# Patient Record
Sex: Female | Born: 1940 | Race: White | Hispanic: No | Marital: Married | Smoking: Former smoker
Health system: Southern US, Community
[De-identification: ages and names within clinical notes are randomized; demographics above are authoritative.]

## PROBLEM LIST (undated history)

## (undated) DIAGNOSIS — I4891 Unspecified atrial fibrillation: Secondary | ICD-10-CM

## (undated) DIAGNOSIS — I2699 Other pulmonary embolism without acute cor pulmonale: Secondary | ICD-10-CM

## (undated) DIAGNOSIS — Z993 Dependence on wheelchair: Secondary | ICD-10-CM

## (undated) DIAGNOSIS — K449 Diaphragmatic hernia without obstruction or gangrene: Secondary | ICD-10-CM

## (undated) DIAGNOSIS — I10 Essential (primary) hypertension: Secondary | ICD-10-CM

## (undated) DIAGNOSIS — I82409 Acute embolism and thrombosis of unspecified deep veins of unspecified lower extremity: Secondary | ICD-10-CM

## (undated) DIAGNOSIS — R42 Dizziness and giddiness: Secondary | ICD-10-CM

## (undated) DIAGNOSIS — M810 Age-related osteoporosis without current pathological fracture: Secondary | ICD-10-CM

## (undated) DIAGNOSIS — E119 Type 2 diabetes mellitus without complications: Secondary | ICD-10-CM

## (undated) DIAGNOSIS — D869 Sarcoidosis, unspecified: Secondary | ICD-10-CM

## (undated) DIAGNOSIS — M1990 Unspecified osteoarthritis, unspecified site: Secondary | ICD-10-CM

## (undated) DIAGNOSIS — G8929 Other chronic pain: Secondary | ICD-10-CM

## (undated) HISTORY — PX: LUNG LOBECTOMY: SHX167

## (undated) HISTORY — DX: Type 2 diabetes mellitus without complications: E11.9

## (undated) HISTORY — DX: Sarcoidosis, unspecified: D86.9

## (undated) HISTORY — PX: ABDOMINAL HYSTERECTOMY: SHX81

## (undated) HISTORY — PX: JOINT REPLACEMENT: SHX530

## (undated) HISTORY — DX: Diaphragmatic hernia without obstruction or gangrene: K44.9

## (undated) HISTORY — DX: Age-related osteoporosis without current pathological fracture: M81.0

---

## 1998-01-21 ENCOUNTER — Ambulatory Visit (HOSPITAL_COMMUNITY): Admission: RE | Admit: 1998-01-21 | Discharge: 1998-01-21 | Payer: Self-pay

## 1998-02-02 ENCOUNTER — Ambulatory Visit (HOSPITAL_COMMUNITY): Admission: RE | Admit: 1998-02-02 | Discharge: 1998-02-02 | Payer: Self-pay

## 1998-02-14 ENCOUNTER — Ambulatory Visit (HOSPITAL_COMMUNITY): Admission: RE | Admit: 1998-02-14 | Discharge: 1998-02-14 | Payer: Self-pay | Admitting: Psychiatry

## 1998-03-02 ENCOUNTER — Other Ambulatory Visit: Admission: RE | Admit: 1998-03-02 | Discharge: 1998-03-02 | Payer: Self-pay | Admitting: Endocrinology

## 1998-03-15 ENCOUNTER — Ambulatory Visit (HOSPITAL_COMMUNITY): Admission: RE | Admit: 1998-03-15 | Discharge: 1998-03-15 | Payer: Self-pay | Admitting: Endocrinology

## 1998-11-07 ENCOUNTER — Ambulatory Visit (HOSPITAL_COMMUNITY): Admission: RE | Admit: 1998-11-07 | Discharge: 1998-11-07 | Payer: Self-pay | Admitting: Endocrinology

## 1998-11-07 ENCOUNTER — Encounter: Payer: Self-pay | Admitting: Endocrinology

## 1999-12-10 ENCOUNTER — Ambulatory Visit (HOSPITAL_COMMUNITY): Admission: RE | Admit: 1999-12-10 | Discharge: 1999-12-10 | Payer: Self-pay

## 1999-12-13 ENCOUNTER — Ambulatory Visit (HOSPITAL_COMMUNITY): Admission: RE | Admit: 1999-12-13 | Discharge: 1999-12-13 | Payer: Self-pay

## 2000-05-17 ENCOUNTER — Ambulatory Visit (HOSPITAL_COMMUNITY): Admission: RE | Admit: 2000-05-17 | Discharge: 2000-05-17 | Payer: Self-pay

## 2001-03-04 ENCOUNTER — Other Ambulatory Visit: Admission: RE | Admit: 2001-03-04 | Discharge: 2001-03-04 | Payer: Self-pay | Admitting: Obstetrics and Gynecology

## 2001-03-27 ENCOUNTER — Ambulatory Visit (HOSPITAL_COMMUNITY): Admission: RE | Admit: 2001-03-27 | Discharge: 2001-03-27 | Payer: Self-pay

## 2001-04-18 ENCOUNTER — Encounter: Payer: Self-pay | Admitting: Emergency Medicine

## 2001-04-19 ENCOUNTER — Observation Stay (HOSPITAL_COMMUNITY): Admission: EM | Admit: 2001-04-19 | Discharge: 2001-04-21 | Payer: Self-pay | Admitting: Emergency Medicine

## 2002-03-18 ENCOUNTER — Encounter: Admission: RE | Admit: 2002-03-18 | Discharge: 2002-03-18 | Payer: Self-pay

## 2002-04-20 ENCOUNTER — Encounter: Payer: Self-pay | Admitting: Neurology

## 2002-04-20 ENCOUNTER — Ambulatory Visit (HOSPITAL_COMMUNITY): Admission: RE | Admit: 2002-04-20 | Discharge: 2002-04-20 | Payer: Self-pay | Admitting: Neurology

## 2002-05-07 ENCOUNTER — Encounter: Payer: Self-pay | Admitting: Neurology

## 2002-05-07 ENCOUNTER — Ambulatory Visit (HOSPITAL_COMMUNITY): Admission: RE | Admit: 2002-05-07 | Discharge: 2002-05-07 | Payer: Self-pay | Admitting: Neurology

## 2002-12-27 ENCOUNTER — Ambulatory Visit (HOSPITAL_COMMUNITY): Admission: RE | Admit: 2002-12-27 | Discharge: 2002-12-27 | Payer: Self-pay

## 2003-05-17 ENCOUNTER — Encounter: Admission: RE | Admit: 2003-05-17 | Discharge: 2003-05-17 | Payer: Self-pay

## 2003-06-14 ENCOUNTER — Encounter: Admission: RE | Admit: 2003-06-14 | Discharge: 2003-06-14 | Payer: Self-pay

## 2003-07-02 ENCOUNTER — Encounter: Admission: RE | Admit: 2003-07-02 | Discharge: 2003-07-02 | Payer: Self-pay

## 2004-12-09 ENCOUNTER — Ambulatory Visit (HOSPITAL_COMMUNITY): Admission: RE | Admit: 2004-12-09 | Discharge: 2004-12-09 | Payer: Self-pay

## 2005-04-17 ENCOUNTER — Emergency Department (HOSPITAL_COMMUNITY): Admission: EM | Admit: 2005-04-17 | Discharge: 2005-04-17 | Payer: Self-pay | Admitting: *Deleted

## 2007-11-15 ENCOUNTER — Emergency Department (HOSPITAL_COMMUNITY): Admission: EM | Admit: 2007-11-15 | Discharge: 2007-11-15 | Payer: Self-pay | Admitting: Emergency Medicine

## 2010-05-02 ENCOUNTER — Inpatient Hospital Stay (HOSPITAL_COMMUNITY): Admission: RE | Admit: 2010-05-02 | Discharge: 2010-05-05 | Payer: Self-pay | Admitting: Orthopedic Surgery

## 2010-08-27 ENCOUNTER — Encounter: Payer: Self-pay | Admitting: Neurology

## 2010-10-19 ENCOUNTER — Ambulatory Visit (HOSPITAL_COMMUNITY): Payer: Medicare Other | Attending: Rheumatology

## 2010-10-19 DIAGNOSIS — M810 Age-related osteoporosis without current pathological fracture: Secondary | ICD-10-CM | POA: Insufficient documentation

## 2010-10-19 LAB — URINALYSIS, ROUTINE W REFLEX MICROSCOPIC
Hgb urine dipstick: NEGATIVE
Ketones, ur: 15 mg/dL — AB
Protein, ur: NEGATIVE mg/dL
Urobilinogen, UA: 0.2 mg/dL (ref 0.0–1.0)

## 2010-10-19 LAB — COMPREHENSIVE METABOLIC PANEL
ALT: 14 U/L (ref 0–35)
AST: 25 U/L (ref 0–37)
Albumin: 3 g/dL — ABNORMAL LOW (ref 3.5–5.2)
Calcium: 9.2 mg/dL (ref 8.4–10.5)
GFR calc Af Amer: >60 mL/min (ref >60)
Sodium: 140 mEq/L (ref 135–145)
Total Protein: 6.4 g/dL (ref 6.0–8.3)

## 2010-10-19 LAB — GLUCOSE, CAPILLARY
Glucose-Capillary: 105 mg/dL — ABNORMAL HIGH (ref 70–99)
Glucose-Capillary: 112 mg/dL — ABNORMAL HIGH (ref 70–99)
Glucose-Capillary: 142 mg/dL — ABNORMAL HIGH (ref 70–99)
Glucose-Capillary: 67 mg/dL — ABNORMAL LOW (ref 70–99)
Glucose-Capillary: 80 mg/dL (ref 70–99)
Glucose-Capillary: 88 mg/dL (ref 70–99)
Glucose-Capillary: 96 mg/dL (ref 70–99)

## 2010-10-19 LAB — CBC
HCT: 36 % (ref 36.0–46.0)
MCHC: 32.7 g/dL (ref 30.0–36.0)
MCV: 98.4 fL (ref 78.0–100.0)
Platelets: 299 10*3/uL (ref 150–400)
RBC: 3.66 MIL/uL — ABNORMAL LOW (ref 3.87–5.11)
RDW: 12.1 % (ref 11.5–15.5)
WBC: 9.4 10*3/uL (ref 4.0–10.5)

## 2010-10-19 LAB — BASIC METABOLIC PANEL
Chloride: 100 mEq/L (ref 96–112)
Creatinine, Ser: 0.6 mg/dL (ref 0.4–1.2)
GFR calc Af Amer: >60 mL/min (ref >60)
Potassium: 3.5 mEq/L (ref 3.5–5.1)

## 2010-12-22 NOTE — H&P (Signed)
[place]  Patient:    Elizabeth Drake, Elizabeth Drake Visit Number: [id number] MRN: [id number]          Service Type: MED Location: [phone] Attending Physician:  Armandina Gemma Dictated by:   Lemmie Evens, M.D. 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  The patient has had symptoms of general fatigue with the inability to function very well on the day prior to admission.  In fact, she was unable to walk in the home just prior to being brought to the emergency room.  She had been noticing the passage of foul-smelling urine for several days prior to the admission.  In addition, she has had one or two episodes of shaking chills. She does have mild nausea and one or two episodes of diarrhea without abdominal pain.  She denies recent cough, peripheral numbness or chest pain.  PAST MEDICAL HISTORY:  The patient does have a past medical history of sarcoidosis for the past five years.  She does she a Dr. Everlena Cooper, an ophthalmologist at [facility], for recurrent sarcoid-related iritis.  Dr. Phylliss Bob has been following the patient, as well.  The patient has had some dyspnea on exertion.  Apparently, she is not followed by a pulmonary specialist on a regular basis.  MEDICATIONS:  The patient does take several medications, which include Fosamax 70 mg q.d.; prednisone 10 mg q.d.; methotrexate 17.5 mg weekly.  She did take one dose of Septra on the day of admission because of the urinary symptoms, thinking that she did have a urinary tract infection.  SOCIAL HISTORY:  The patient denies smoking cigarettes or drinking alcohol.  ALLERGIES:  The patient has had intolerance to both Indocin and  Darvocet previously.  FAMILY HISTORY:  The patients mother has been treated for spinal osteoarthritis.  LABORATORY DATA:  White cell count about 15,000.  Approximately 12-20 white cells in the urine.  Potassium level approximately 2.8.  PHYSICAL EXAMINATION:  GENERAL:  Healthy appearing, although she does have generalized fatigue.  NECK:  No thyromegaly or adenopathy.  Good flexibility.  HEART:  Regular sinus rhythm.  Heart rate 70.  No murmurs, rubs, or gallops appreciated.  LUNGS:  Clear breath sounds bilaterally without rales.  ABDOMEN:  Soft abdomen without hepatosplenomegaly or tenderness.  Bowel sounds were active.  EXTREMITIES:  Good mobility of the upper and lower extremities without signs of joint swelling or peripheral edema.   NEUROLOGIC:  The patient was oriented to time and place.  She could move upper and lower extremities without difficulty.  ASSESSMENT:  The patient has had symptoms of malaise, fatigue, and recurrent chills with low-grade fever prior to admission.  In addition, she has complained of foul-smelling urine over the last few days.  I do suspect she has urosepsis.  She is being admitted for IV antibiotic therapy.  In addition, she does have hypokalemia. Dictated by:   Lemmie Evens, M.D. Attending Physician:  Armandina Gemma DD:  04/19/01 TD:  04/19/01 Job: [id number]

## 2011-05-01 LAB — CBC
Hemoglobin: 13.6
MCHC: 34.4
MCV: 101.7 — ABNORMAL HIGH
RBC: 3.91

## 2011-05-01 LAB — DIFFERENTIAL
Basophils Absolute: 0.1
Eosinophils Absolute: 0
Lymphs Abs: 1.8
Neutrophils Relative %: 75

## 2011-05-01 LAB — COMPREHENSIVE METABOLIC PANEL
ALT: 19
CO2: 27
Calcium: 8.8
Creatinine, Ser: 0.65
GFR calc non Af Amer: >60
Glucose, Bld: 151 — ABNORMAL HIGH
Sodium: 136
Total Bilirubin: 0.7

## 2011-05-01 LAB — URINALYSIS, ROUTINE W REFLEX MICROSCOPIC
Bilirubin Urine: NEGATIVE
Glucose, UA: NEGATIVE
Ketones, ur: NEGATIVE
pH: 6.5

## 2011-08-09 DIAGNOSIS — T7840XA Allergy, unspecified, initial encounter: Secondary | ICD-10-CM | POA: Diagnosis not present

## 2011-08-15 DIAGNOSIS — L259 Unspecified contact dermatitis, unspecified cause: Secondary | ICD-10-CM | POA: Diagnosis not present

## 2011-08-15 DIAGNOSIS — D485 Neoplasm of uncertain behavior of skin: Secondary | ICD-10-CM | POA: Diagnosis not present

## 2011-08-28 DIAGNOSIS — L259 Unspecified contact dermatitis, unspecified cause: Secondary | ICD-10-CM | POA: Diagnosis not present

## 2011-08-29 DIAGNOSIS — M810 Age-related osteoporosis without current pathological fracture: Secondary | ICD-10-CM | POA: Diagnosis not present

## 2011-08-29 DIAGNOSIS — D869 Sarcoidosis, unspecified: Secondary | ICD-10-CM | POA: Diagnosis not present

## 2011-08-29 DIAGNOSIS — M545 Low back pain: Secondary | ICD-10-CM | POA: Diagnosis not present

## 2011-09-24 DIAGNOSIS — L259 Unspecified contact dermatitis, unspecified cause: Secondary | ICD-10-CM | POA: Diagnosis not present

## 2011-09-24 DIAGNOSIS — B86 Scabies: Secondary | ICD-10-CM | POA: Diagnosis not present

## 2011-09-25 DIAGNOSIS — L851 Acquired keratosis [keratoderma] palmaris et plantaris: Secondary | ICD-10-CM | POA: Diagnosis not present

## 2011-09-25 DIAGNOSIS — E119 Type 2 diabetes mellitus without complications: Secondary | ICD-10-CM | POA: Diagnosis not present

## 2011-09-25 DIAGNOSIS — E1159 Type 2 diabetes mellitus with other circulatory complications: Secondary | ICD-10-CM | POA: Diagnosis not present

## 2011-10-08 DIAGNOSIS — L0390 Cellulitis, unspecified: Secondary | ICD-10-CM | POA: Diagnosis not present

## 2011-10-08 DIAGNOSIS — L0291 Cutaneous abscess, unspecified: Secondary | ICD-10-CM | POA: Diagnosis not present

## 2011-10-11 DIAGNOSIS — L0291 Cutaneous abscess, unspecified: Secondary | ICD-10-CM | POA: Diagnosis not present

## 2011-10-11 DIAGNOSIS — E785 Hyperlipidemia, unspecified: Secondary | ICD-10-CM | POA: Diagnosis not present

## 2011-10-11 DIAGNOSIS — L0390 Cellulitis, unspecified: Secondary | ICD-10-CM | POA: Diagnosis not present

## 2011-10-11 DIAGNOSIS — R7989 Other specified abnormal findings of blood chemistry: Secondary | ICD-10-CM | POA: Diagnosis not present

## 2011-10-11 DIAGNOSIS — I10 Essential (primary) hypertension: Secondary | ICD-10-CM | POA: Diagnosis not present

## 2011-10-11 DIAGNOSIS — E119 Type 2 diabetes mellitus without complications: Secondary | ICD-10-CM | POA: Diagnosis not present

## 2011-11-19 DIAGNOSIS — M810 Age-related osteoporosis without current pathological fracture: Secondary | ICD-10-CM | POA: Diagnosis not present

## 2011-11-27 DIAGNOSIS — I10 Essential (primary) hypertension: Secondary | ICD-10-CM | POA: Diagnosis not present

## 2011-12-04 DIAGNOSIS — E119 Type 2 diabetes mellitus without complications: Secondary | ICD-10-CM | POA: Diagnosis not present

## 2011-12-04 DIAGNOSIS — L609 Nail disorder, unspecified: Secondary | ICD-10-CM | POA: Diagnosis not present

## 2011-12-04 DIAGNOSIS — L851 Acquired keratosis [keratoderma] palmaris et plantaris: Secondary | ICD-10-CM | POA: Diagnosis not present

## 2011-12-04 DIAGNOSIS — E1159 Type 2 diabetes mellitus with other circulatory complications: Secondary | ICD-10-CM | POA: Diagnosis not present

## 2012-01-02 DIAGNOSIS — N390 Urinary tract infection, site not specified: Secondary | ICD-10-CM | POA: Diagnosis not present

## 2012-02-12 DIAGNOSIS — N3000 Acute cystitis without hematuria: Secondary | ICD-10-CM | POA: Diagnosis not present

## 2012-02-12 DIAGNOSIS — E119 Type 2 diabetes mellitus without complications: Secondary | ICD-10-CM | POA: Diagnosis not present

## 2012-02-12 DIAGNOSIS — N3941 Urge incontinence: Secondary | ICD-10-CM | POA: Diagnosis not present

## 2012-02-12 DIAGNOSIS — E1159 Type 2 diabetes mellitus with other circulatory complications: Secondary | ICD-10-CM | POA: Diagnosis not present

## 2012-02-12 DIAGNOSIS — L851 Acquired keratosis [keratoderma] palmaris et plantaris: Secondary | ICD-10-CM | POA: Diagnosis not present

## 2012-02-12 DIAGNOSIS — N3020 Other chronic cystitis without hematuria: Secondary | ICD-10-CM | POA: Diagnosis not present

## 2012-02-26 DIAGNOSIS — M810 Age-related osteoporosis without current pathological fracture: Secondary | ICD-10-CM | POA: Diagnosis not present

## 2012-02-26 DIAGNOSIS — D869 Sarcoidosis, unspecified: Secondary | ICD-10-CM | POA: Diagnosis not present

## 2012-03-03 DIAGNOSIS — I10 Essential (primary) hypertension: Secondary | ICD-10-CM | POA: Diagnosis not present

## 2012-03-03 DIAGNOSIS — N390 Urinary tract infection, site not specified: Secondary | ICD-10-CM | POA: Diagnosis not present

## 2012-03-03 DIAGNOSIS — E785 Hyperlipidemia, unspecified: Secondary | ICD-10-CM | POA: Diagnosis not present

## 2012-03-03 DIAGNOSIS — E119 Type 2 diabetes mellitus without complications: Secondary | ICD-10-CM | POA: Diagnosis not present

## 2012-03-11 DIAGNOSIS — N3020 Other chronic cystitis without hematuria: Secondary | ICD-10-CM | POA: Diagnosis not present

## 2012-03-18 DIAGNOSIS — M810 Age-related osteoporosis without current pathological fracture: Secondary | ICD-10-CM | POA: Diagnosis not present

## 2012-04-22 DIAGNOSIS — E119 Type 2 diabetes mellitus without complications: Secondary | ICD-10-CM | POA: Diagnosis not present

## 2012-04-22 DIAGNOSIS — L851 Acquired keratosis [keratoderma] palmaris et plantaris: Secondary | ICD-10-CM | POA: Diagnosis not present

## 2012-04-22 DIAGNOSIS — E1159 Type 2 diabetes mellitus with other circulatory complications: Secondary | ICD-10-CM | POA: Diagnosis not present

## 2012-04-22 DIAGNOSIS — B351 Tinea unguium: Secondary | ICD-10-CM | POA: Diagnosis not present

## 2012-04-29 DIAGNOSIS — D869 Sarcoidosis, unspecified: Secondary | ICD-10-CM | POA: Diagnosis not present

## 2012-04-29 DIAGNOSIS — Z23 Encounter for immunization: Secondary | ICD-10-CM | POA: Diagnosis not present

## 2012-04-29 DIAGNOSIS — M810 Age-related osteoporosis without current pathological fracture: Secondary | ICD-10-CM | POA: Diagnosis not present

## 2012-05-27 DIAGNOSIS — Z79899 Other long term (current) drug therapy: Secondary | ICD-10-CM | POA: Diagnosis not present

## 2012-05-27 DIAGNOSIS — Z01812 Encounter for preprocedural laboratory examination: Secondary | ICD-10-CM | POA: Diagnosis not present

## 2012-06-16 DIAGNOSIS — K5900 Constipation, unspecified: Secondary | ICD-10-CM | POA: Diagnosis not present

## 2012-06-16 DIAGNOSIS — E785 Hyperlipidemia, unspecified: Secondary | ICD-10-CM | POA: Diagnosis not present

## 2012-06-16 DIAGNOSIS — I10 Essential (primary) hypertension: Secondary | ICD-10-CM | POA: Diagnosis not present

## 2012-06-16 DIAGNOSIS — E039 Hypothyroidism, unspecified: Secondary | ICD-10-CM | POA: Diagnosis not present

## 2012-06-16 DIAGNOSIS — M810 Age-related osteoporosis without current pathological fracture: Secondary | ICD-10-CM | POA: Diagnosis not present

## 2012-07-08 DIAGNOSIS — B351 Tinea unguium: Secondary | ICD-10-CM | POA: Diagnosis not present

## 2012-07-08 DIAGNOSIS — E119 Type 2 diabetes mellitus without complications: Secondary | ICD-10-CM | POA: Diagnosis not present

## 2012-07-08 DIAGNOSIS — E1159 Type 2 diabetes mellitus with other circulatory complications: Secondary | ICD-10-CM | POA: Diagnosis not present

## 2012-07-08 DIAGNOSIS — L851 Acquired keratosis [keratoderma] palmaris et plantaris: Secondary | ICD-10-CM | POA: Diagnosis not present

## 2012-07-17 DIAGNOSIS — H524 Presbyopia: Secondary | ICD-10-CM | POA: Diagnosis not present

## 2012-07-17 DIAGNOSIS — H43399 Other vitreous opacities, unspecified eye: Secondary | ICD-10-CM | POA: Diagnosis not present

## 2012-07-17 DIAGNOSIS — H2510 Age-related nuclear cataract, unspecified eye: Secondary | ICD-10-CM | POA: Diagnosis not present

## 2012-07-17 DIAGNOSIS — E119 Type 2 diabetes mellitus without complications: Secondary | ICD-10-CM | POA: Diagnosis not present

## 2012-08-04 DIAGNOSIS — D869 Sarcoidosis, unspecified: Secondary | ICD-10-CM | POA: Diagnosis not present

## 2012-08-04 DIAGNOSIS — M810 Age-related osteoporosis without current pathological fracture: Secondary | ICD-10-CM | POA: Diagnosis not present

## 2012-09-30 DIAGNOSIS — E559 Vitamin D deficiency, unspecified: Secondary | ICD-10-CM | POA: Diagnosis not present

## 2012-09-30 DIAGNOSIS — D869 Sarcoidosis, unspecified: Secondary | ICD-10-CM | POA: Diagnosis not present

## 2012-09-30 DIAGNOSIS — E119 Type 2 diabetes mellitus without complications: Secondary | ICD-10-CM | POA: Diagnosis not present

## 2012-09-30 DIAGNOSIS — E785 Hyperlipidemia, unspecified: Secondary | ICD-10-CM | POA: Diagnosis not present

## 2012-09-30 DIAGNOSIS — I10 Essential (primary) hypertension: Secondary | ICD-10-CM | POA: Diagnosis not present

## 2012-10-14 DIAGNOSIS — B351 Tinea unguium: Secondary | ICD-10-CM | POA: Diagnosis not present

## 2012-10-14 DIAGNOSIS — L851 Acquired keratosis [keratoderma] palmaris et plantaris: Secondary | ICD-10-CM | POA: Diagnosis not present

## 2012-10-14 DIAGNOSIS — E1159 Type 2 diabetes mellitus with other circulatory complications: Secondary | ICD-10-CM | POA: Diagnosis not present

## 2012-10-14 DIAGNOSIS — E119 Type 2 diabetes mellitus without complications: Secondary | ICD-10-CM | POA: Diagnosis not present

## 2012-11-03 DIAGNOSIS — D869 Sarcoidosis, unspecified: Secondary | ICD-10-CM | POA: Diagnosis not present

## 2012-11-03 DIAGNOSIS — M810 Age-related osteoporosis without current pathological fracture: Secondary | ICD-10-CM | POA: Diagnosis not present

## 2012-11-04 DIAGNOSIS — N3020 Other chronic cystitis without hematuria: Secondary | ICD-10-CM | POA: Diagnosis not present

## 2012-11-04 DIAGNOSIS — N3941 Urge incontinence: Secondary | ICD-10-CM | POA: Diagnosis not present

## 2012-11-05 ENCOUNTER — Telehealth: Payer: Self-pay | Admitting: Nurse Practitioner

## 2012-11-05 NOTE — Telephone Encounter (Signed)
Completed by RX office

## 2012-11-05 NOTE — Telephone Encounter (Signed)
Please advise 

## 2012-11-12 ENCOUNTER — Other Ambulatory Visit: Payer: Self-pay | Admitting: *Deleted

## 2012-11-12 MED ORDER — METFORMIN HCL 500 MG PO TABS: 500.0000 mg | ORAL_TABLET | Freq: Every day | ORAL | Status: DC

## 2012-11-13 ENCOUNTER — Other Ambulatory Visit: Payer: Self-pay | Admitting: Nurse Practitioner

## 2012-11-13 ENCOUNTER — Other Ambulatory Visit (INDEPENDENT_AMBULATORY_CARE_PROVIDER_SITE_OTHER): Payer: Medicare Other

## 2012-11-13 DIAGNOSIS — Z01812 Encounter for preprocedural laboratory examination: Secondary | ICD-10-CM

## 2012-11-13 DIAGNOSIS — Z5181 Encounter for therapeutic drug level monitoring: Secondary | ICD-10-CM

## 2012-11-13 DIAGNOSIS — Z79899 Other long term (current) drug therapy: Secondary | ICD-10-CM | POA: Diagnosis not present

## 2012-11-13 NOTE — Progress Notes (Signed)
Pt came in for labs only 

## 2012-11-17 ENCOUNTER — Telehealth: Payer: Self-pay | Admitting: Nurse Practitioner

## 2012-11-18 NOTE — Telephone Encounter (Signed)
PLEASE ADVISE LOOKS LIKE LABS WHERE CANCELED

## 2012-11-18 NOTE — Telephone Encounter (Signed)
NOT BACK YET PER LAB

## 2012-11-18 NOTE — Telephone Encounter (Signed)
Will call with results when get. Check with lab and make sure done.

## 2012-11-19 LAB — C-TERMINAL TELOPEPTIDE: C-Telopeptide (CTx): 290 pg/mL

## 2012-11-26 ENCOUNTER — Telehealth: Payer: Self-pay | Admitting: Nurse Practitioner

## 2012-11-26 NOTE — Telephone Encounter (Signed)
Please advise 

## 2012-11-26 NOTE — Telephone Encounter (Signed)
FAx to Dr.Shroud labs

## 2012-11-26 NOTE — Telephone Encounter (Signed)
Last seen 09/30/12  Do not see alprazolam on patients med list  Mail order sent in refill order  No directions   If approved print and have nurse call patient for pick up for mail order

## 2012-12-01 ENCOUNTER — Telehealth: Payer: Self-pay | Admitting: Nurse Practitioner

## 2012-12-01 NOTE — Telephone Encounter (Signed)
I spoke with patient and she understands that the labs she had done on the 10th were normal and that we have to review any labs that are drawn through our office.

## 2012-12-04 NOTE — Telephone Encounter (Signed)
Faxed to Dr Bradly Chris [phone]

## 2012-12-23 DIAGNOSIS — B351 Tinea unguium: Secondary | ICD-10-CM | POA: Diagnosis not present

## 2012-12-23 DIAGNOSIS — L851 Acquired keratosis [keratoderma] palmaris et plantaris: Secondary | ICD-10-CM | POA: Diagnosis not present

## 2012-12-23 DIAGNOSIS — E1159 Type 2 diabetes mellitus with other circulatory complications: Secondary | ICD-10-CM | POA: Diagnosis not present

## 2012-12-26 DIAGNOSIS — H43819 Vitreous degeneration, unspecified eye: Secondary | ICD-10-CM | POA: Diagnosis not present

## 2012-12-26 DIAGNOSIS — E119 Type 2 diabetes mellitus without complications: Secondary | ICD-10-CM | POA: Diagnosis not present

## 2012-12-26 DIAGNOSIS — H2510 Age-related nuclear cataract, unspecified eye: Secondary | ICD-10-CM | POA: Diagnosis not present

## 2012-12-26 DIAGNOSIS — H3340 Traction detachment of retina, unspecified eye: Secondary | ICD-10-CM | POA: Diagnosis not present

## 2012-12-31 ENCOUNTER — Encounter: Payer: Self-pay | Admitting: Nurse Practitioner

## 2012-12-31 ENCOUNTER — Ambulatory Visit (INDEPENDENT_AMBULATORY_CARE_PROVIDER_SITE_OTHER): Payer: Medicare Other | Admitting: Nurse Practitioner

## 2012-12-31 VITALS — BP 129/70 | HR 87 | Temp 99.2°F | Ht 66.0 in | Wt 116.0 lb

## 2012-12-31 DIAGNOSIS — E119 Type 2 diabetes mellitus without complications: Secondary | ICD-10-CM | POA: Diagnosis not present

## 2012-12-31 DIAGNOSIS — D869 Sarcoidosis, unspecified: Secondary | ICD-10-CM | POA: Diagnosis not present

## 2012-12-31 DIAGNOSIS — E785 Hyperlipidemia, unspecified: Secondary | ICD-10-CM | POA: Insufficient documentation

## 2012-12-31 LAB — COMPLETE METABOLIC PANEL WITH GFR
Alkaline Phosphatase: 45 U/L (ref 39–117)
BUN: 12 mg/dL (ref 6–23)
CO2: 29 mEq/L (ref 19–32)
Creat: 0.62 mg/dL (ref 0.50–1.10)
GFR, Est African American: >89 mL/min
GFR, Est Non African American: >89 mL/min
Glucose, Bld: 135 mg/dL — ABNORMAL HIGH (ref 70–99)
Sodium: 140 mEq/L (ref 135–145)
Total Bilirubin: 0.6 mg/dL (ref 0.3–1.2)

## 2012-12-31 LAB — POCT GLYCOSYLATED HEMOGLOBIN (HGB A1C): Hemoglobin A1C: 6.3

## 2012-12-31 NOTE — Progress Notes (Signed)
  Subjective:    Patient ID: Elizabeth Drake, female    DOB: 1941/02/08, 72 y.o.   MRN: [id number]  Diabetes She presents for her follow-up diabetic visit. She has type 2 diabetes mellitus. No MedicAlert identification noted. The initial diagnosis of diabetes was made 10 years ago. Her disease course has been stable. There are no hypoglycemic associated symptoms. Pertinent negatives for diabetes include no chest pain, no fatigue, no foot paresthesias, no foot ulcerations, no visual change, no weakness and no weight loss. There are no hypoglycemic complications. Symptoms are stable. Current diabetic treatment includes oral agent (dual therapy). She is compliant with treatment all of the time. Her weight is stable. She is following a generally healthy diet. When asked about meal planning, she reported none. She has not had a previous visit with a dietician. She rarely participates in exercise. There is no change in her home blood glucose trend. Her breakfast blood glucose is taken between 8-9 am. Her breakfast blood glucose range is generally 110-130 mg/dl. Her overall blood glucose range is 110-130 mg/dl. An ACE inhibitor/angiotensin II receptor blocker is not being taken. She does not see a podiatrist.Eye exam is current (last week- Getting ready to have cataract surgery.).   Urinary urgency Enablex working well- No c/o side effects   Review of Systems  Constitutional: Negative for weight loss and fatigue.  Cardiovascular: Negative for chest pain.  Neurological: Negative for weakness.  All other systems reviewed and are negative.       Objective:   Physical Exam  Constitutional: She is oriented to person, place, and time. She appears well-developed and well-nourished.  HENT:  Nose: Nose normal.  Mouth/Throat: Oropharynx is clear and moist.  Eyes: EOM are normal.  Neck: Trachea normal, normal range of motion and full passive range of motion without pain. Neck supple. No JVD present. Carotid  bruit is not present. No thyromegaly present.  Cardiovascular: Normal rate, regular rhythm, normal heart sounds and intact distal pulses.  Exam reveals no gallop and no friction rub.   No murmur heard. Pulmonary/Chest: Effort normal and breath sounds normal.  Abdominal: Soft. Bowel sounds are normal. She exhibits no distension and no mass. There is no tenderness.  Musculoskeletal: Normal range of motion.  Lymphadenopathy:    She has no cervical adenopathy.  Neurological: She is alert and oriented to person, place, and time. She has normal reflexes.  Skin: Skin is warm and dry.  Psychiatric: She has a normal mood and affect. Her behavior is normal. Judgment and thought content normal.   BP 129/70  Pulse 87  Temp(Src) 99.2 F (37.3 C) (Oral)  Ht 5\' 6"  (1.676 m)  Wt 116 lb (52.617 kg)  BMI 18.73 kg/m2  Results for orders placed in visit on 12/31/12  POCT GLYCOSYLATED HEMOGLOBIN (HGB A1C)      Result Value Range   Hemoglobin A1C 6.3%         Assessment & Plan:  1. Diabetes Count carbs Keep diary of blood sugars - POCT glycosylated hemoglobin (Hb A1C) - NMR Lipoprofile with Lipids - COMPLETE METABOLIC PANEL WITH GFR  2. Sarcoidosis  - POCT glycosylated hemoglobin (Hb A1C) - NMR Lipoprofile with Lipids - COMPLETE METABOLIC PANEL WITH GFR  3. Hyperlipidemia Low fat diet Labs pending  Mary-Margaret Daphine Deutscher, FNP

## 2012-12-31 NOTE — Patient Instructions (Signed)

## 2013-01-01 LAB — NMR LIPOPROFILE WITH LIPIDS
HDL Size: 9.1 nm — ABNORMAL LOW (ref >=9.2)
HDL-C: 50 mg/dL (ref >=40)
LDL Size: 20.6 nm (ref >20.5)
Large HDL-P: 7.3 umol/L (ref >=4.8)
Large VLDL-P: 2.6 nmol/L (ref <=2.7)
Small LDL Particle Number: 440 nmol/L (ref <=527)

## 2013-01-07 DIAGNOSIS — H43819 Vitreous degeneration, unspecified eye: Secondary | ICD-10-CM | POA: Diagnosis not present

## 2013-01-07 DIAGNOSIS — H209 Unspecified iridocyclitis: Secondary | ICD-10-CM | POA: Diagnosis not present

## 2013-01-07 DIAGNOSIS — H2510 Age-related nuclear cataract, unspecified eye: Secondary | ICD-10-CM | POA: Diagnosis not present

## 2013-01-07 DIAGNOSIS — H348392 Tributary (branch) retinal vein occlusion, unspecified eye, stable: Secondary | ICD-10-CM | POA: Diagnosis not present

## 2013-01-20 DIAGNOSIS — H348392 Tributary (branch) retinal vein occlusion, unspecified eye, stable: Secondary | ICD-10-CM | POA: Diagnosis not present

## 2013-02-03 DIAGNOSIS — N3941 Urge incontinence: Secondary | ICD-10-CM | POA: Diagnosis not present

## 2013-02-03 DIAGNOSIS — N3020 Other chronic cystitis without hematuria: Secondary | ICD-10-CM | POA: Diagnosis not present

## 2013-02-10 ENCOUNTER — Other Ambulatory Visit: Payer: Self-pay

## 2013-02-10 MED ORDER — GLIMEPIRIDE 2 MG PO TABS: 2.0000 mg | ORAL_TABLET | Freq: Every day | ORAL | Status: DC

## 2013-02-10 NOTE — Telephone Encounter (Signed)
Last glucose level 09/2012

## 2013-02-10 NOTE — Telephone Encounter (Signed)
Last seen 09/30/12  MMM  Last glucose level 09/30/12

## 2013-02-12 ENCOUNTER — Telehealth: Payer: Self-pay | Admitting: Nurse Practitioner

## 2013-02-12 MED ORDER — METFORMIN HCL 500 MG PO TABS: 500.0000 mg | ORAL_TABLET | Freq: Every day | ORAL | Status: DC

## 2013-02-12 NOTE — Telephone Encounter (Signed)
done

## 2013-02-25 DIAGNOSIS — H348392 Tributary (branch) retinal vein occlusion, unspecified eye, stable: Secondary | ICD-10-CM | POA: Diagnosis not present

## 2013-02-25 DIAGNOSIS — H43819 Vitreous degeneration, unspecified eye: Secondary | ICD-10-CM | POA: Diagnosis not present

## 2013-02-25 DIAGNOSIS — D869 Sarcoidosis, unspecified: Secondary | ICD-10-CM | POA: Diagnosis not present

## 2013-02-25 DIAGNOSIS — H2510 Age-related nuclear cataract, unspecified eye: Secondary | ICD-10-CM | POA: Diagnosis not present

## 2013-03-11 ENCOUNTER — Other Ambulatory Visit: Payer: Self-pay

## 2013-03-17 DIAGNOSIS — L851 Acquired keratosis [keratoderma] palmaris et plantaris: Secondary | ICD-10-CM | POA: Diagnosis not present

## 2013-03-17 DIAGNOSIS — B351 Tinea unguium: Secondary | ICD-10-CM | POA: Diagnosis not present

## 2013-03-17 DIAGNOSIS — E1159 Type 2 diabetes mellitus with other circulatory complications: Secondary | ICD-10-CM | POA: Diagnosis not present

## 2013-03-26 DIAGNOSIS — M810 Age-related osteoporosis without current pathological fracture: Secondary | ICD-10-CM | POA: Diagnosis not present

## 2013-03-26 DIAGNOSIS — D869 Sarcoidosis, unspecified: Secondary | ICD-10-CM | POA: Diagnosis not present

## 2013-04-08 DIAGNOSIS — H2510 Age-related nuclear cataract, unspecified eye: Secondary | ICD-10-CM | POA: Diagnosis not present

## 2013-04-08 DIAGNOSIS — D869 Sarcoidosis, unspecified: Secondary | ICD-10-CM | POA: Diagnosis not present

## 2013-04-08 DIAGNOSIS — H348392 Tributary (branch) retinal vein occlusion, unspecified eye, stable: Secondary | ICD-10-CM | POA: Diagnosis not present

## 2013-04-08 DIAGNOSIS — H43819 Vitreous degeneration, unspecified eye: Secondary | ICD-10-CM | POA: Diagnosis not present

## 2013-04-16 ENCOUNTER — Other Ambulatory Visit: Payer: Self-pay | Admitting: Nurse Practitioner

## 2013-04-16 MED ORDER — METFORMIN HCL 500 MG PO TABS: 500.0000 mg | ORAL_TABLET | Freq: Every day | ORAL | Status: DC

## 2013-04-16 NOTE — Telephone Encounter (Signed)
LAST AIC 6.3 ON 12/31/12.

## 2013-04-24 ENCOUNTER — Encounter: Payer: Self-pay | Admitting: Family Medicine

## 2013-04-24 ENCOUNTER — Ambulatory Visit (INDEPENDENT_AMBULATORY_CARE_PROVIDER_SITE_OTHER): Payer: Medicare Other

## 2013-04-24 ENCOUNTER — Telehealth: Payer: Self-pay | Admitting: Nurse Practitioner

## 2013-04-24 ENCOUNTER — Ambulatory Visit (INDEPENDENT_AMBULATORY_CARE_PROVIDER_SITE_OTHER): Payer: Medicare Other | Admitting: Family Medicine

## 2013-04-24 ENCOUNTER — Other Ambulatory Visit: Payer: Self-pay | Admitting: Family Medicine

## 2013-04-24 VITALS — BP 133/70 | HR 86 | Temp 99.0°F | Ht 61.0 in | Wt 116.0 lb

## 2013-04-24 DIAGNOSIS — L0291 Cutaneous abscess, unspecified: Secondary | ICD-10-CM | POA: Diagnosis not present

## 2013-04-24 DIAGNOSIS — L0390 Cellulitis, unspecified: Secondary | ICD-10-CM

## 2013-04-24 DIAGNOSIS — E119 Type 2 diabetes mellitus without complications: Secondary | ICD-10-CM

## 2013-04-24 DIAGNOSIS — R52 Pain, unspecified: Secondary | ICD-10-CM | POA: Diagnosis not present

## 2013-04-24 LAB — POCT CBC
HCT, POC: 38.6 % (ref 37.7–47.9)
Lymph, poc: 1.1 (ref 0.6–3.4)
MCH, POC: 33 pg — AB (ref 27–31.2)
MCV: 95.1 fL (ref 80–97)
Platelet Count, POC: 216 10*3/uL (ref 142–424)
RDW, POC: 12.6 %
WBC: 13.1 10*3/uL — AB (ref 4.6–10.2)

## 2013-04-24 LAB — GLUCOSE, POCT (MANUAL RESULT ENTRY): POC Glucose: 146 mg/dl (ref 70–99)

## 2013-04-24 MED ORDER — CEFTRIAXONE SODIUM 1 G IJ SOLR: 1.0000 g | Freq: Once | INTRAMUSCULAR | Status: DC

## 2013-04-24 MED ORDER — DOXYCYCLINE HYCLATE 100 MG PO CAPS: 100.0000 mg | ORAL_CAPSULE | Freq: Two times a day (BID) | ORAL | Status: DC

## 2013-04-24 MED ORDER — CIPROFLOXACIN HCL 500 MG PO TABS: 500.0000 mg | ORAL_TABLET | Freq: Two times a day (BID) | ORAL | Status: DC

## 2013-04-24 NOTE — Telephone Encounter (Signed)
Appt given for today 

## 2013-04-24 NOTE — Progress Notes (Addendum)
Subjective:    Patient ID: Elizabeth Drake, female    DOB: 1941-07-22, 72 y.o.   MRN: [id number]  HPI Patient presents today with chief complaint of left foot redness and swelling. Patient is a baseline diabetic. Overall well controlled. Patient has had progressive redness and swelling of the left foot in particular the left second toe over the last 2 days. Patient denies any known trauma. Patient will place him over-the-counter cream over her feet around this time frame and is unsure this is what may cause symptoms. Patient is a prior history of hammertoe surgery of the left second toe. Patient noticed some mucopurulent drainage of the left toe over the course of the past 24 hours. Patient denies any significant pain. No fevers or chills. Patient also takes daily Bactrim for her recurrent urinary tract infections.    Review of Systems  All other systems reviewed and are negative.       Objective:     Physical Exam  Constitutional: She appears well-developed and well-nourished.  HENT:  Head: Normocephalic and atraumatic.  Eyes: Pupils are equal, round, and reactive to light.  Neck: Normal range of motion.  Cardiovascular: Normal rate and regular rhythm.   Pulmonary/Chest: Effort normal.  Abdominal: Soft.  Neurological: She is alert.  Skin: Rash noted.    Minimal TTP over affected toe.   [facility] reading (PRIMARY) by  Dr. Alvester Morin  Preliminarily with no definitive osteomyelitic changes                                  Dg Foot Complete Left  04/24/2013   *RADIOLOGY REPORT*  Clinical Data: Erythema involving the left second toe, open wound, evaluate for osteomyelitis, initial encounter.  LEFT FOOT - COMPLETE 3+ VIEW  Comparison: None.  Findings:  No definite fracture or dislocation.  Hammertoe deformities are seen involving the second through fifth digits.  Joint spaces appear preserved.  No definite erosions. No discrete areas of osteolysis to suggest osteomyelitis, though  evaluation of the digits is degraded due to obliquity.  There is possible mild diffuse soft tissue swelling about the foot.  No radiopaque foreign body.  No discrete areas of subcutaneous emphysema.  Small plantar calcaneal spur.  IMPRESSION: 1.  No definite fracture or dislocation.  2.  While there are no definitive evidence of osteomyelitis, evaluation of the digits is degraded due to obliquity.  Further evaluation may be obtained with dedicated radiographs of the digit of interest as clinically indicated.   Original Report Authenticated By: Tacey Ruiz, MD   Dg Toe 2nd Left  04/24/2013   CLINICAL DATA:  Possible osteomyelitis left 2nd toe  EXAM: LEFT SECOND TOE  COMPARISON:  None.  FINDINGS: Three views of the left 2nd toe submitted. No acute fracture or subluxation. There is diffuse osteopenia. Soft tissue swelling noted 2nd toe. No periosteal reaction or bone destruction to suggest definite osteomyelitis.  IMPRESSION: No periosteal reaction or bone destruction to suggest definite osteomyelitis. Soft tissue swelling is noted.   Electronically Signed   By: Natasha Mead   On: 04/24/2013 13:04   CBC    Component Value Date/Time   WBC 13.1* 04/24/2013 1151   WBC 9.4 05/03/2010 0455   RBC 4.1 04/24/2013 1151   RBC 3.66* 05/03/2010 0455   HGB 13.4 04/24/2013 1151   HGB 11.9* 05/03/2010 0455   HCT 38.6 04/24/2013 1151   HCT 36.0 05/03/2010 0455  PLT 299 05/03/2010 0455   MCV 95.1 04/24/2013 1151   MCV 98.4 05/03/2010 0455   MCH 33.0* 04/24/2013 1151   MCH 32.5 05/03/2010 0455   MCHC 34.7 04/24/2013 1151   MCHC 33.1 05/03/2010 0455   RDW 11.8 05/03/2010 0455   LYMPHSABS 1.8 11/15/2007 1230   MONOABS 1.6* 11/15/2007 1230   EOSABS 0.0 11/15/2007 1230   BASOSABS 0.1 11/15/2007 1230          Assessment & Plan:  Cellulitis - Plan: DG Foot Complete Left, Sedimentation Rate, POCT glucose (manual entry), POCT CBC  DM (diabetes mellitus) - Plan: Glucose (CBG), POCT glucose (manual entry), POCT CBC  X-rays  negative for any osteomyelitic changes today. CBG 148. Sedimentation rate is pending. Noted mildly elevated white count at 13 today. Hemodynamically stable and otherwise a symptomatic patient,  well-appearing patient. Rocephin 1 g IM x1. Will start patient on doxycycline and Cipro for double gram-negative as well as MRSA coverage. Had a relatively lengthy discussion with both patient and husband. They feel comfortable patient going home. Patient will like to avoid going to the hospital if at all possible. Plan for followup within next 24 hours for reevaluation of symptoms. Border of  erythema marked to assess for progression at followup visit. Discussed with both patient and husband at length if patient develops any fever, chills, generalized malaise, she's to go to the hospital for further evaluation of symptoms. Will need repeat CBC tomorrow morning.

## 2013-04-24 NOTE — Addendum Note (Signed)
Addended by: Magdalene River on: 04/24/2013 02:31 PM   Modules accepted: Orders

## 2013-04-25 ENCOUNTER — Encounter (HOSPITAL_COMMUNITY): Payer: Self-pay | Admitting: Family Medicine

## 2013-04-25 ENCOUNTER — Ambulatory Visit (INDEPENDENT_AMBULATORY_CARE_PROVIDER_SITE_OTHER): Payer: Medicare Other | Admitting: Family Medicine

## 2013-04-25 ENCOUNTER — Inpatient Hospital Stay (HOSPITAL_COMMUNITY)
Admission: AD | Admit: 2013-04-25 | Discharge: 2013-04-27 | DRG: 603 | Disposition: A | Discharge status: Home / Self Care | Payer: Medicare Other | Source: Ambulatory Visit | Attending: Internal Medicine | Admitting: Internal Medicine

## 2013-04-25 VITALS — BP 134/71 | HR 81 | Temp 96.7°F | Ht 61.0 in | Wt 116.0 lb

## 2013-04-25 DIAGNOSIS — Z87440 Personal history of urinary (tract) infections: Secondary | ICD-10-CM

## 2013-04-25 DIAGNOSIS — N390 Urinary tract infection, site not specified: Secondary | ICD-10-CM

## 2013-04-25 DIAGNOSIS — N318 Other neuromuscular dysfunction of bladder: Secondary | ICD-10-CM | POA: Diagnosis present

## 2013-04-25 DIAGNOSIS — L03039 Cellulitis of unspecified toe: Secondary | ICD-10-CM | POA: Diagnosis not present

## 2013-04-25 DIAGNOSIS — D72829 Elevated white blood cell count, unspecified: Secondary | ICD-10-CM

## 2013-04-25 DIAGNOSIS — G8220 Paraplegia, unspecified: Secondary | ICD-10-CM

## 2013-04-25 DIAGNOSIS — L0390 Cellulitis, unspecified: Secondary | ICD-10-CM

## 2013-04-25 DIAGNOSIS — G8929 Other chronic pain: Secondary | ICD-10-CM

## 2013-04-25 DIAGNOSIS — Z87891 Personal history of nicotine dependence: Secondary | ICD-10-CM

## 2013-04-25 DIAGNOSIS — M2040 Other hammer toe(s) (acquired), unspecified foot: Secondary | ICD-10-CM | POA: Diagnosis present

## 2013-04-25 DIAGNOSIS — N3281 Overactive bladder: Secondary | ICD-10-CM

## 2013-04-25 DIAGNOSIS — IMO0002 Reserved for concepts with insufficient information to code with codable children: Secondary | ICD-10-CM | POA: Diagnosis not present

## 2013-04-25 DIAGNOSIS — L0291 Cutaneous abscess, unspecified: Secondary | ICD-10-CM

## 2013-04-25 DIAGNOSIS — D869 Sarcoidosis, unspecified: Secondary | ICD-10-CM | POA: Diagnosis not present

## 2013-04-25 DIAGNOSIS — M549 Dorsalgia, unspecified: Secondary | ICD-10-CM

## 2013-04-25 DIAGNOSIS — E119 Type 2 diabetes mellitus without complications: Secondary | ICD-10-CM

## 2013-04-25 DIAGNOSIS — K449 Diaphragmatic hernia without obstruction or gangrene: Secondary | ICD-10-CM | POA: Diagnosis present

## 2013-04-25 DIAGNOSIS — M810 Age-related osteoporosis without current pathological fracture: Secondary | ICD-10-CM | POA: Diagnosis present

## 2013-04-25 DIAGNOSIS — L03032 Cellulitis of left toe: Secondary | ICD-10-CM

## 2013-04-25 DIAGNOSIS — L97509 Non-pressure chronic ulcer of other part of unspecified foot with unspecified severity: Secondary | ICD-10-CM | POA: Diagnosis present

## 2013-04-25 DIAGNOSIS — R5381 Other malaise: Secondary | ICD-10-CM | POA: Diagnosis present

## 2013-04-25 DIAGNOSIS — Z96698 Presence of other orthopedic joint implants: Secondary | ICD-10-CM | POA: Diagnosis not present

## 2013-04-25 DIAGNOSIS — Z993 Dependence on wheelchair: Secondary | ICD-10-CM | POA: Diagnosis not present

## 2013-04-25 DIAGNOSIS — Z23 Encounter for immunization: Secondary | ICD-10-CM

## 2013-04-25 DIAGNOSIS — L609 Nail disorder, unspecified: Secondary | ICD-10-CM | POA: Diagnosis present

## 2013-04-25 DIAGNOSIS — Z79899 Other long term (current) drug therapy: Secondary | ICD-10-CM

## 2013-04-25 DIAGNOSIS — E785 Hyperlipidemia, unspecified: Secondary | ICD-10-CM

## 2013-04-25 DIAGNOSIS — M1990 Unspecified osteoarthritis, unspecified site: Secondary | ICD-10-CM | POA: Diagnosis present

## 2013-04-25 DIAGNOSIS — L02619 Cutaneous abscess of unspecified foot: Principal | ICD-10-CM | POA: Diagnosis present

## 2013-04-25 DIAGNOSIS — M7989 Other specified soft tissue disorders: Secondary | ICD-10-CM | POA: Diagnosis not present

## 2013-04-25 DIAGNOSIS — G894 Chronic pain syndrome: Secondary | ICD-10-CM | POA: Insufficient documentation

## 2013-04-25 LAB — COMPREHENSIVE METABOLIC PANEL
ALT: 11 U/L (ref 0–35)
AST: 18 IU/L (ref 0–40)
AST: 14 U/L (ref 0–37)
Albumin/Globulin Ratio: 1.5 (ref 1.1–2.5)
Albumin: 3.9 g/dL (ref 3.5–4.8)
Albumin: 3.4 g/dL — ABNORMAL LOW (ref 3.5–5.2)
BUN/Creatinine Ratio: 22 (ref 11–26)
BUN: 15 mg/dL (ref 8–27)
CO2: 27 mmol/L (ref 18–29)
CO2: 26 mEq/L (ref 19–32)
Calcium: 10.1 mg/dL (ref 8.6–10.2)
Chloride: 101 mEq/L (ref 96–112)
Creatinine, Ser: 0.68 mg/dL (ref 0.57–1.00)
GFR calc Af Amer: >90 mL/min (ref >90)
GFR calc non Af Amer: 87 mL/min/{1.73_m2} (ref >59)
GFR calc non Af Amer: 83 mL/min — ABNORMAL LOW (ref >90)
Globulin, Total: 2.6 g/dL (ref 1.5–4.5)
Potassium: 4 mEq/L (ref 3.5–5.1)
Sodium: 138 mmol/L (ref 134–144)
Sodium: 137 mEq/L (ref 135–145)
Total Bilirubin: 0.6 mg/dL (ref 0.3–1.2)

## 2013-04-25 LAB — GLUCOSE, CAPILLARY
Glucose-Capillary: 263 mg/dL — ABNORMAL HIGH (ref 70–99)
Glucose-Capillary: 78 mg/dL (ref 70–99)

## 2013-04-25 LAB — CBC WITH DIFFERENTIAL/PLATELET
HCT: 38.2 % (ref 36.0–46.0)
Hemoglobin: 13.1 g/dL (ref 12.0–15.0)
Lymphocytes Relative: 9 % — ABNORMAL LOW (ref 12–46)
Lymphs Abs: 0.9 10*3/uL (ref 0.7–4.0)
Monocytes Absolute: 0.3 10*3/uL (ref 0.1–1.0)
Monocytes Relative: 3 % (ref 3–12)
Neutro Abs: 8.7 10*3/uL — ABNORMAL HIGH (ref 1.7–7.7)
Neutrophils Relative %: 88 % — ABNORMAL HIGH (ref 43–77)
RBC: 3.91 MIL/uL (ref 3.87–5.11)

## 2013-04-25 LAB — SEDIMENTATION RATE: Sed Rate: 16 mm/hr (ref 0–40)

## 2013-04-25 LAB — HEMOGLOBIN A1C
Hgb A1c MFr Bld: 7.3 % — ABNORMAL HIGH (ref <5.7)
Mean Plasma Glucose: 163 mg/dL — ABNORMAL HIGH (ref <117)

## 2013-04-25 MED ORDER — INSULIN ASPART 100 UNIT/ML ~~LOC~~ SOLN
0.0000 [IU] | Freq: Three times a day (TID) | SUBCUTANEOUS | Status: DC
  Administered 2013-04-25: 8 [IU] via SUBCUTANEOUS
  Administered 2013-04-26: 5 [IU] via SUBCUTANEOUS

## 2013-04-25 MED ORDER — DOCUSATE SODIUM 100 MG PO CAPS
100.0000 mg | ORAL_CAPSULE | Freq: Two times a day (BID) | ORAL | Status: DC
  Administered 2013-04-25: 100 mg via ORAL
  Filled 2013-04-25 (×2): qty 1

## 2013-04-25 MED ORDER — ONDANSETRON HCL 4 MG PO TABS: 4.0000 mg | ORAL_TABLET | Freq: Four times a day (QID) | ORAL | Status: DC | PRN

## 2013-04-25 MED ORDER — HEPARIN SODIUM (PORCINE) 5000 UNIT/ML IJ SOLN
5000.0000 [IU] | Freq: Three times a day (TID) | INTRAMUSCULAR | Status: DC
  Administered 2013-04-25 – 2013-04-27 (×5): 5000 [IU] via SUBCUTANEOUS
  Filled 2013-04-25 (×9): qty 1

## 2013-04-25 MED ORDER — SODIUM CHLORIDE 0.9 % IV SOLN
INTRAVENOUS | Status: DC
  Administered 2013-04-25: 15:00:00 via INTRAVENOUS

## 2013-04-25 MED ORDER — ONDANSETRON HCL 4 MG/2ML IJ SOLN: 4.0000 mg | Freq: Four times a day (QID) | INTRAMUSCULAR | Status: DC | PRN

## 2013-04-25 MED ORDER — INFLUENZA VAC SPLIT QUAD 0.5 ML IM SUSP
0.5000 mL | INTRAMUSCULAR | Status: AC
  Administered 2013-04-26: 0.5 mL via INTRAMUSCULAR
  Filled 2013-04-25: qty 0.5

## 2013-04-25 MED ORDER — DARIFENACIN HYDROBROMIDE ER 15 MG PO TB24
15.0000 mg | ORAL_TABLET | Freq: Every day | ORAL | Status: DC
  Administered 2013-04-26 – 2013-04-27 (×2): 15 mg via ORAL
  Filled 2013-04-25 (×2): qty 1

## 2013-04-25 MED ORDER — SENNOSIDES-DOCUSATE SODIUM 8.6-50 MG PO TABS: 1.0000 | ORAL_TABLET | Freq: Every evening | ORAL | Status: DC | PRN

## 2013-04-25 MED ORDER — HYDROCODONE-ACETAMINOPHEN 5-325 MG PO TABS
1.0000 | ORAL_TABLET | Freq: Four times a day (QID) | ORAL | Status: DC | PRN
  Administered 2013-04-25 – 2013-04-27 (×7): 1 via ORAL
  Filled 2013-04-25 (×7): qty 1

## 2013-04-25 MED ORDER — AMPICILLIN-SULBACTAM SODIUM 1.5 (1-0.5) G IJ SOLR
1.5000 g | Freq: Four times a day (QID) | INTRAMUSCULAR | Status: DC
  Administered 2013-04-25 – 2013-04-27 (×8): 1.5 g via INTRAVENOUS
  Filled 2013-04-25 (×10): qty 1.5

## 2013-04-25 MED ORDER — PNEUMOCOCCAL VAC POLYVALENT 25 MCG/0.5ML IJ INJ
0.5000 mL | INJECTION | INTRAMUSCULAR | Status: AC
  Filled 2013-04-25: qty 0.5

## 2013-04-25 MED ORDER — PREDNISONE 10 MG PO TABS
10.0000 mg | ORAL_TABLET | Freq: Every day | ORAL | Status: DC
  Administered 2013-04-26 – 2013-04-27 (×2): 10 mg via ORAL
  Filled 2013-04-25 (×3): qty 1

## 2013-04-25 NOTE — Progress Notes (Signed)
ANTIBIOTIC CONSULT NOTE - INITIAL  Pharmacy Consult for Unasyn Indication: L foot infection  Allergies  Allergen Reactions  . Advil [Ibuprofen] Shortness Of Breath and Swelling  . Azo [Phenazopyridine] Other (See Comments)    Unknown reaction  . Indocin [Indomethacin] Other (See Comments)    Unknown reaction  . Loratadine Other (See Comments)    unknown  . Percocet [Oxycodone-Acetaminophen] Itching    Patient Measurements: Height: 5\' 1"  (154.9 cm) Weight: 115 lb (52.164 kg) IBW/kg (Calculated) : 47.8 Adjusted Body Weight:    Vital Signs: Temp: 98.2 F (36.8 C) (09/20 1251) Temp src: Oral (09/20 1251) BP: 127/54 mmHg (09/20 1251) Pulse Rate: 82 (09/20 1251) Intake/Output from previous day:   Intake/Output from this shift:    Labs:  Recent Labs  04/24/13 1151 04/24/13 1631  WBC 13.1*  --   HGB 13.4  --   CREATININE  --  0.68   Estimated Creatinine Clearance: 47.3 ml/min (by C-G formula based on Cr of 0.68). No results found for this basename: VANCOTROUGH, VANCOPEAK, VANCORANDOM, GENTTROUGH, GENTPEAK, GENTRANDOM, TOBRATROUGH, TOBRAPEAK, TOBRARND, AMIKACINPEAK, AMIKACINTROU, AMIKACIN,  in the last 72 hours   Microbiology: No results found for this or any previous visit (from the past 720 hour(s)).  Medical History: Past Medical History  Diagnosis Date  . Sarcoidosis   . Hiatal hernia   . Diabetes mellitus without complication   . Osteoporosis     Medications:  Facility-administered medications prior to admission  Medication Dose Route Frequency Provider Last Rate Last Dose  . cefTRIAXone (ROCEPHIN) injection 1 g  1 g Intramuscular Once Doree Albee, MD       Prescriptions prior to admission  Medication Sig Dispense Refill  . calcium-vitamin D (OSCAL 500/200 D-3) 500-200 MG-UNIT per tablet Take 1 tablet by mouth daily with breakfast.      . ciprofloxacin (CIPRO) 500 MG tablet Take 1 tablet (500 mg total) by mouth 2 (two) times daily.  20 tablet  0  .  darifenacin (ENABLEX) 15 MG 24 hr tablet Take 15 mg by mouth daily.      Marland Kitchen doxycycline (VIBRAMYCIN) 100 MG capsule Take 1 capsule (100 mg total) by mouth 2 (two) times daily.  20 capsule  0  . HYDROcodone-acetaminophen (NORCO/VICODIN) 5-325 MG per tablet Take 1 tablet by mouth every 6 (six) hours as needed for pain.      . metFORMIN (GLUCOPHAGE) 500 MG tablet Take 1 tablet (500 mg total) by mouth daily with breakfast.  30 tablet  3  . Multiple Vitamin (MULTIVITAMIN WITH MINERALS) TABS tablet Take 1 tablet by mouth daily.      . nitrofurantoin (MACRODANTIN) 100 MG capsule Take 100 mg by mouth at bedtime.      . predniSONE (DELTASONE) 10 MG tablet Take 10 mg by mouth daily with breakfast.       . glimepiride (AMARYL) 2 MG tablet Take 1 tablet (2 mg total) by mouth daily.  30 tablet  0   Assessment: L foot and toe redness and swelling. Patient has history of hammertoe surgery of the left second toe which is now red and infected. + drainage. Patient also takes daily Bactrim for her recurrent urinary tract infections. Outpatient x-rays were negative for osteo. Has also failed current outpatient treatment of Cipro/Dox. Plan to admit for IV abx.  WBC 13.1. Scr 0.68, CrCl 47   Plan:  Unasyn 1.5g IV q6hrs. Dose ok down to CrCl 30. Pharmacy will sign off. Please reconsult for further dosing assistance.  Courvoisier Hamblen S. Merilynn Finland, PharmD, BCPS Clinical Staff Pharmacist Pager [phone]  Misty Stanley Stillinger 04/25/2013,3:19 PM

## 2013-04-25 NOTE — H&P (Addendum)
History and Physical Examination  Elizabeth Drake MVH:[id number] DOB: 26-Jul-1941 DOA: 04/25/2013  PCP: Rudi Heap, MD   Chief Complaint: cellulitis  HPI: Elizabeth Drake is a 72 y.o. well-controlled diabetic female who presented to her primary care provider with chief complaint of left foot and toe redness and swelling 2 days ago.  Patient has had progressive redness and swelling of the left foot in particular the left second toe over the last 2 days. Patient denies any known trauma. Patient had been using an unknown OTC cream over her feet around this time frame and is unsure this is what may caused the problem. Pt is wheelchair bound.  Patient has history of hammertoe surgery of the left second toe which is now red and infected.  It has a yellow tip and associated with warmth and redness.  Patient noticed some mucopurulent drainage of the left toe over the course of the past 24 hours.   Patient denies any significant pain. No fevers or chills. Patient also takes daily macrobid for her recurrent urinary tract infections.  Pt was given ceftriaxone IM in the office and followed up the next day.  The cellulitis area had been marked with ink and there had been no significant improvement.  Also, the patient had outpatient xrays of the left foot and toe that revealed no osteomyelitis.  The patient denied fever and chills.  She had been prescribed cipro and doxy and had started taking that.  Because she continued to have significant redness and there was a question of possible spread of the area, hospitalization was requested for IV antibiotics.    Past Medical History Past Medical History  Diagnosis Date  . Sarcoidosis   . Hiatal hernia   . Diabetes mellitus without complication   . Osteoporosis     Past Surgical History Past Surgical History  Procedure Laterality Date  . Abdominal hysterectomy    . Joint replacement      toe    Home Meds: Prior to Admission medications   Medication Sig Start  Date End Date Taking? Authorizing Provider  ciprofloxacin (CIPRO) 500 MG tablet Take 1 tablet (500 mg total) by mouth 2 (two) times daily. 04/24/13  Yes Doree Albee, MD  doxycycline (VIBRAMYCIN) 100 MG capsule Take 1 capsule (100 mg total) by mouth 2 (two) times daily. 04/24/13  Yes Doree Albee, MD  metFORMIN (GLUCOPHAGE) 500 MG tablet Take 1 tablet (500 mg total) by mouth daily with breakfast. 04/16/13  Yes Mary-Margaret Daphine Deutscher, FNP  nitrofurantoin (MACRODANTIN) 100 MG capsule Take 100 mg by mouth at bedtime.   Yes Historical Provider, MD  predniSONE (DELTASONE) 10 MG tablet Take 10 mg by mouth daily with breakfast.    Yes Historical Provider, MD  darifenacin (ENABLEX) 15 MG 24 hr tablet Take 15 mg by mouth daily.    Historical Provider, MD  glimepiride (AMARYL) 2 MG tablet Take 1 tablet (2 mg total) by mouth daily. 02/10/13   Mary-Margaret Daphine Deutscher, FNP   Allergies: AdvilConcepcion Elk; Indocin; Loratadine; and Percocet  Social History:  History   Social History  . Marital Status: Married    Spouse Name: N/A    Number of Children: N/A  . Years of Education: N/A   Occupational History  . Not on file.   Social History Main Topics  . Smoking status: Former Smoker    Types: Cigarettes    Quit date: 12/31/2000  . Smokeless tobacco: Not on file  . Alcohol Use: No  . Drug Use:  No  . Sexual Activity: Not on file   Other Topics Concern  . Not on file   Social History Narrative  . No narrative on file   Family History:  Family History  Problem Relation Age of Onset  . Heart disease Mother   . Cancer Father     pancratic    Review of Systems: The patient denies anorexia, fever, weight loss,, vision loss, decreased hearing, hoarseness, chest pain, syncope, dyspnea on exertion, peripheral edema, balance deficits, hemoptysis, abdominal pain, melena, hematochezia, severe indigestion/heartburn, hematuria, incontinence, genital sores, muscle weakness, suspicious skin lesions, transient blindness,  difficulty walking, depression, unusual weight change, abnormal bleeding, enlarged lymph nodes, angioedema, and breast masses. Please see HPI - All other systems reviewed and reported as negative.   Physical Exam: Blood pressure 127/54, pulse 82, temperature 98.2 F (36.8 C), temperature source Oral, resp. rate 16, height 5\' 1"  (1.549 m), weight 52.164 kg (115 lb), SpO2 97.00%. General appearance: alert, cooperative, appears stated age and no distress Head: Normocephalic, without obvious abnormality, atraumatic Eyes: negative Nose: Nares normal. Septum midline. Mucosa normal. No drainage or sinus tenderness., no discharge Throat: MMM Neck: no adenopathy, no carotid bruit, no JVD, supple, symmetrical, trachea midline and thyroid not enlarged, symmetric, no tenderness/mass/nodules Lungs: clear to auscultation bilaterally and normal percussion bilaterally Heart: S1, S2 normal Abdomen: soft, non-tender; bowel sounds normal; no masses,  no organomegaly Extremities: redness and warmth of left foot and left second toe with no drainage seen. purulent area seen on tip of left 2nd toe Pulses: 2+ and symmetric Skin: Skin color, texture, turgor normal. No rashes or lesions Lymph nodes: Cervical, supraclavicular, and axillary nodes normal. Neurologic: Grossly normal  Lab  And Imaging results:  Results for orders placed during the hospital encounter of 04/25/13 (from the past 24 hour(s))  GLUCOSE, CAPILLARY     Status: Abnormal   Collection Time    04/25/13 12:54 PM      Result Value Range   Glucose-Capillary 232 (*) 70 - 99 mg/dL   Comment 1 Documented in Chart     Comment 2 Notify RN     Impression  Cellulitis of left foot and toe Admit to med surg for IV antibiotics Blood cultures x 2  Check CBC with diff and follow Wound culture Elevate leg  Diabetes, type 2 Monitor BS Hold metformin and amaryl Supplemental insulin as needed Add basal insulin if needed  OAB (overactive  bladder) Resuming home meds  Leukocytosis, unspecified Check CBC with diff and follow  Physical deconditioning Pt eval OT eval   Sarcoidosis on chronic daily steroids, will continue  Osteoporosis, severe   Chronic back pain Chronic leg pain OA (osteoarthritis) Resume home meds  Pain meds as needed  Hyperlipidemia  Standley Dakins MD [facility] [place], [place] [phone] 04/25/2013, 2:24 PM

## 2013-04-25 NOTE — Patient Instructions (Signed)
Cellulitis Cellulitis is an infection of the skin and the tissue beneath it. The infected area is usually red and tender. Cellulitis occurs most often in the arms and lower legs.  CAUSES  Cellulitis is caused by bacteria that enter the skin through cracks or cuts in the skin. The most common types of bacteria that cause cellulitis are Staphylococcus and Streptococcus. SYMPTOMS   Redness and warmth.  Swelling.  Tenderness or pain.  Fever. DIAGNOSIS  Your caregiver can usually determine what is wrong based on a physical exam. Blood tests may also be done. TREATMENT  Treatment usually involves taking an antibiotic medicine. HOME CARE INSTRUCTIONS   Take your antibiotics as directed. Finish them even if you start to feel better.  Keep the infected arm or leg elevated to reduce swelling.  Apply a warm cloth to the affected area up to 4 times per day to relieve pain.  Only take over-the-counter or prescription medicines for pain, discomfort, or fever as directed by your caregiver.  Keep all follow-up appointments as directed by your caregiver. SEEK MEDICAL CARE IF:   You notice red streaks coming from the infected area.  Your red area gets larger or turns dark in color.  Your bone or joint underneath the infected area becomes painful after the skin has healed.  Your infection returns in the same area or another area.  You notice a swollen bump in the infected area.  You develop new symptoms. SEEK IMMEDIATE MEDICAL CARE IF:   You have a fever.  You feel very sleepy.  You develop vomiting or diarrhea.  You have a general ill feeling (malaise) with muscle aches and pains. MAKE SURE YOU:   Understand these instructions.  Will watch your condition.  Will get help right away if you are not doing well or get worse. Document Released: 05/02/2005 Document Revised: 01/22/2012 Document Reviewed: 10/08/2011 [employer] Patient Information 2014 [employer].  

## 2013-04-25 NOTE — Progress Notes (Signed)
  Subjective:    Patient ID: Elizabeth Drake, female    DOB: 03/09/1941, 72 y.o.   MRN: [id number]  HPI This 72 y.o. female presents for evaluation of cellulitis left second toe.  She was seen By Dr. Alvester Morin yesterday who did come get me, introduce patient, and I did see her Right second toe and cellulites yesterday.  She did have xray which did not show any periosteal involvment of the right toe but did show soft tissue swelling.  She did have mildly elevated wbc yesterday of 13.  She has hx of diabetes.  She states she feels fine and doesn't want to go to  The hospital.  Dr. Alvester Morin took pictures and marked her erythema on her left foot.  She states She feels fine and is afebrile.   Review of Systems C/o left 2nd toe and foot cellulitis.   No chest pain, SOB, HA, dizziness, vision change, N/V, diarrhea, constipation, dysuria, urinary urgency or frequency, myalgias, arthralgias or rash.  Objective:   Physical Exam  Vital signs noted  Well developed well nourished female.  HEENT - Head atraumatic Normocephalic                Eyes - PERRLA, Conjuctiva - clear Sclera- Clear EOMI                Ears - EAC's Wnl TM's Wnl Gross Hearing WNL                Nose - Nares patent                 Throat - oropharanx wnl Respiratory - Lungs CTA bilateral Cardiac - RRR S1 and S2 without murmur GI - Abdomen soft Nontender and bowel sounds active x 4 Extremities - Left second toe with cellulitis, edema, toe nail with sloughing and erythema and clear Drainage, ecchymosis around the left toe nail and distal left toe, edema in the left toe and distal left foot With erythema extending into the left foot past the marked area from yesterday.  She has 2 plus DP And PT pulses in her left foot. She has mild TTP left toe and no pain in her left foot.      Assessment & Plan:  Cellulitis - Infection does not look better and the erythema in her foot is spreading past the marked Area from yesterday.  I discuss  with patient she should go to hospital and she agrees.  Hospitalist  Dr. Penny Pia is called and given patient H&P and he acccepts her as direct to Med Surg as Observation team 10.  Called ER admission and spoke to bed control and given instructions to Have team 10 paged upon patient arrival.  Given patient instructions that bed is available in 20 minutes and to go directly to [facility] ED admissions and written instructions given as well.  Patient Is advised to follow up after DC from hospital.

## 2013-04-26 DIAGNOSIS — G8220 Paraplegia, unspecified: Secondary | ICD-10-CM

## 2013-04-26 DIAGNOSIS — Z993 Dependence on wheelchair: Secondary | ICD-10-CM

## 2013-04-26 DIAGNOSIS — E119 Type 2 diabetes mellitus without complications: Secondary | ICD-10-CM | POA: Diagnosis not present

## 2013-04-26 DIAGNOSIS — L02619 Cutaneous abscess of unspecified foot: Secondary | ICD-10-CM | POA: Diagnosis not present

## 2013-04-26 LAB — COMPREHENSIVE METABOLIC PANEL
ALT: 9 U/L (ref 0–35)
AST: 13 U/L (ref 0–37)
Albumin: 3 g/dL — ABNORMAL LOW (ref 3.5–5.2)
Alkaline Phosphatase: 40 U/L (ref 39–117)
BUN: 14 mg/dL (ref 6–23)
Creatinine, Ser: 0.6 mg/dL (ref 0.50–1.10)
GFR calc Af Amer: >90 mL/min (ref >90)
Glucose, Bld: 89 mg/dL (ref 70–99)
Potassium: 3.6 mEq/L (ref 3.5–5.1)
Total Bilirubin: 0.6 mg/dL (ref 0.3–1.2)
Total Protein: 6 g/dL (ref 6.0–8.3)

## 2013-04-26 LAB — CBC WITH DIFFERENTIAL/PLATELET
Basophils Absolute: 0 10*3/uL (ref 0.0–0.1)
Basophils Relative: 0 % (ref 0–1)
Eosinophils Relative: 0 % (ref 0–5)
HCT: 36.6 % (ref 36.0–46.0)
Hemoglobin: 12.4 g/dL (ref 12.0–15.0)
Lymphocytes Relative: 31 % (ref 12–46)
Lymphs Abs: 2.3 10*3/uL (ref 0.7–4.0)
MCH: 33.1 pg (ref 26.0–34.0)
MCV: 97.6 fL (ref 78.0–100.0)
Neutro Abs: 4.3 10*3/uL (ref 1.7–7.7)
Platelets: 214 10*3/uL (ref 150–400)
RBC: 3.75 MIL/uL — ABNORMAL LOW (ref 3.87–5.11)
RDW: 12.4 % (ref 11.5–15.5)
WBC: 7.4 10*3/uL (ref 4.0–10.5)

## 2013-04-26 LAB — GLUCOSE, CAPILLARY
Glucose-Capillary: 89 mg/dL (ref 70–99)
Glucose-Capillary: 117 mg/dL — ABNORMAL HIGH (ref 70–99)

## 2013-04-26 LAB — AEROBIC CULTURE

## 2013-04-26 MED ORDER — VANCOMYCIN HCL IN DEXTROSE 1-5 GM/200ML-% IV SOLN
1000.0000 mg | INTRAVENOUS | Status: DC
  Administered 2013-04-26 – 2013-04-27 (×2): 1000 mg via INTRAVENOUS
  Filled 2013-04-26 (×3): qty 200

## 2013-04-26 MED ORDER — METFORMIN HCL 500 MG PO TABS
500.0000 mg | ORAL_TABLET | Freq: Every day | ORAL | Status: DC
  Administered 2013-04-27: 500 mg via ORAL
  Filled 2013-04-26 (×2): qty 1

## 2013-04-26 MED ORDER — GLIMEPIRIDE 2 MG PO TABS
2.0000 mg | ORAL_TABLET | Freq: Every day | ORAL | Status: DC
  Filled 2013-04-26 (×2): qty 1

## 2013-04-26 NOTE — Progress Notes (Signed)
ANTIBIOTIC CONSULT NOTE - INITIAL  Pharmacy Consult for Vancomycin Indication: Left foot and toe infection  Allergies  Allergen Reactions  . Advil [Ibuprofen] Shortness Of Breath and Swelling  . Azo [Phenazopyridine] Other (See Comments)    Unknown reaction  . Indocin [Indomethacin] Other (See Comments)    Unknown reaction  . Loratadine Other (See Comments)    unknown  . Percocet [Oxycodone-Acetaminophen] Itching    Patient Measurements: Height: 5\' 1"  (154.9 cm) Weight: 115 lb (52.164 kg) IBW/kg (Calculated) : 47.8 Adjusted Body Weight:    Vital Signs: Temp: 97.9 F (36.6 C) (09/21 0603) Temp src: Oral (09/21 0603) BP: 155/70 mmHg (09/21 0603) Pulse Rate: 73 (09/21 0603) Intake/Output from previous day: 09/20 0701 - 09/21 0700 In: 746 [I.V.:746] Out: 1 [Urine:1] Intake/Output from this shift:    Labs:  Recent Labs  04/24/13 1151 04/24/13 1631 04/25/13 1600 04/26/13 0500  WBC 13.1*  --  9.9 7.4  HGB 13.4  --  13.1 12.4  PLT  --   --  199 214  CREATININE  --  0.68 0.72 0.60   Estimated Creatinine Clearance: 47.3 ml/min (by C-G formula based on Cr of 0.6). No results found for this basename: VANCOTROUGH, Leodis Binet, VANCORANDOM, GENTTROUGH, GENTPEAK, GENTRANDOM, TOBRATROUGH, TOBRAPEAK, TOBRARND, AMIKACINPEAK, AMIKACINTROU, AMIKACIN,  in the last 72 hours   Microbiology: Recent Results (from the past 720 hour(s))  WOUND CULTURE     Status: None   Collection Time    04/25/13  4:32 PM      Result Value Range Status   Specimen Description WOUND   Final   Special Requests LEFT SECOND TOE PER  JILL MOORE,RN [id number]   Final   Gram Stain PENDING   Incomplete   Culture     Final   Value: Culture reincubated for better growth     Performed at [facility]   Report Status PENDING   Incomplete    Medical History: Past Medical History  Diagnosis Date  . Sarcoidosis   . Hiatal hernia   . Diabetes mellitus without complication   . Osteoporosis      Assessment: L foot and toe redness and swelling. Patient has history of hammertoe surgery of the left second toe which is now red and infected. + drainage. Patient also takes daily Bactrim for her recurrent urinary tract infections. Outpatient x-rays were negative for osteo. Has also failed current outpatient treatment of Cipro/Dox. Admitted for IV abx.   Infectious Disease: L foot/2nd toe infection. Afebrile. WBC 7.4. 9/20 Unasyn>> 9/21 Vanco>>  9/20 Toe Wound>> 9/20 BC x 2>>   Goal of Therapy:  Vancomycin trough level 10-15 mcg/ml  Plan:  Vancomycin 1g IV q24h Vancomycin trough after 3-5 doses at steady state.  Morton Simson S. Merilynn Finland, PharmD, BCPS Clinical Staff Pharmacist Pager [phone]  Misty Stanley Stillinger 04/26/2013,9:45 AM

## 2013-04-26 NOTE — Progress Notes (Signed)
Chart reviewed.  [facility] PROGRESS NOTE  KORRI ASK WUJ:[id number] DOB: [birth year] DOA: 04/25/2013 PCP: Rudi Heap, MD  Assessment/Plan:  Principal Problem:   Cellulitis of toe of left foot with ulcer: improving.  Ulceration with pieces of fabric, etc.  With order bid foot soaks in warm water and betadine.  Add vancomycin.  Continue unasyn. Active Problems:   DM type 2 (diabetes mellitus, type 2): resume home meds   Hyperlipidemia   Sarcoidosis   Paraparesis   Wheelchair bound   HPI/Subjective: No pain. No f/c.  Objective: Filed Vitals:   04/26/13 0603  BP: 155/70  Pulse: 73  Temp: 97.9 F (36.6 C)  Resp: 18    Intake/Output Summary (Last 24 hours) at 04/26/13 0937 Last data filed at 04/26/13 0600  Gross per 24 hour  Intake    746 ml  Output      1 ml  Net    745 ml   Filed Weights   04/25/13 1251  Weight: 52.164 kg (115 lb)    Exam:   General:  Nontoxic.  comfortable  Cardiovascular: RRR without MGR  Respiratory: CTA without WRR  Abdomen: soft, nontender, nondistended  EXT:  No edema.  Toe with ulceration. No odor.  Dried blood and fabric fibers stuck to ulcer.  Cellulitis mainly involving toe (much receded from marker line)   Data Reviewed: Basic Metabolic Panel:  Recent Labs Lab 04/24/13 1631 04/25/13 1600 04/26/13 0500  NA 138 137 138  K 4.1 4.0 3.6  CL 101 101 105  CO2 27 26 24   GLUCOSE 141* 306* 89  BUN 15 16 14   CREATININE 0.68 0.72 0.60  CALCIUM 10.1 9.1 9.2   Liver Function Tests:  Recent Labs Lab 04/24/13 1631 04/25/13 1600 04/26/13 0500  AST 18 14 13   ALT 14 11 9   ALKPHOS 47 46 40  BILITOT 0.6 0.6 0.6  PROT 6.5 6.6 6.0  ALBUMIN  --  3.4* 3.0*   No results found for this basename: LIPASE, AMYLASE,  in the last 168 hours No results found for this basename: AMMONIA,  in the last 168 hours CBC:  Recent Labs Lab 04/24/13 1151 04/25/13 1600 04/26/13 0500  WBC 13.1* 9.9 7.4  NEUTROABS  --  8.7* 4.3   HGB 13.4 13.1 12.4  HCT 38.6 38.2 36.6  MCV 95.1 97.7 97.6  PLT  --  199 214   Cardiac Enzymes: No results found for this basename: CKTOTAL, CKMB, CKMBINDEX, TROPONINI,  in the last 168 hours BNP (last 3 results) No results found for this basename: PROBNP,  in the last 8760 hours CBG:  Recent Labs Lab 04/25/13 1254 04/25/13 1746 04/25/13 2238 04/26/13 0757  GLUCAP 232* 263* 78 89    Recent Results (from the past 240 hour(s))  WOUND CULTURE     Status: None   Collection Time    04/25/13  4:32 PM      Result Value Range Status   Specimen Description WOUND   Final   Special Requests LEFT SECOND TOE PER  JILL MOORE,RN [id number] 1639   Final   Gram Stain PENDING   Incomplete   Culture     Final   Value: Culture reincubated for better growth     Performed at [facility]   Report Status PENDING   Incomplete     Studies: Dg Foot Complete Left  04/24/2013   *RADIOLOGY REPORT*  Clinical Data: Erythema involving the left second toe, open wound, evaluate for osteomyelitis,  initial encounter.  LEFT FOOT - COMPLETE 3+ VIEW  Comparison: None.  Findings:  No definite fracture or dislocation.  Hammertoe deformities are seen involving the second through fifth digits.  Joint spaces appear preserved.  No definite erosions. No discrete areas of osteolysis to suggest osteomyelitis, though evaluation of the digits is degraded due to obliquity.  There is possible mild diffuse soft tissue swelling about the foot.  No radiopaque foreign body.  No discrete areas of subcutaneous emphysema.  Small plantar calcaneal spur.  IMPRESSION: 1.  No definite fracture or dislocation.  2.  While there are no definitive evidence of osteomyelitis, evaluation of the digits is degraded due to obliquity.  Further evaluation may be obtained with dedicated radiographs of the digit of interest as clinically indicated.   Original Report Authenticated By: Tacey Ruiz, MD   Dg Toe 2nd Left  04/24/2013   CLINICAL DATA:   Possible osteomyelitis left 2nd toe  EXAM: LEFT SECOND TOE  COMPARISON:  None.  FINDINGS: Three views of the left 2nd toe submitted. No acute fracture or subluxation. There is diffuse osteopenia. Soft tissue swelling noted 2nd toe. No periosteal reaction or bone destruction to suggest definite osteomyelitis.  IMPRESSION: No periosteal reaction or bone destruction to suggest definite osteomyelitis. Soft tissue swelling is noted.   Electronically Signed   By: Natasha Mead   On: 04/24/2013 13:04    Scheduled Meds: . ampicillin-sulbactam (UNASYN) IV  1.5 g Intravenous Q6H  . darifenacin  15 mg Oral Daily  . docusate sodium  100 mg Oral BID  . heparin  5,000 Units Subcutaneous Q8H  . influenza vac split quadrivalent PF  0.5 mL Intramuscular Tomorrow-1000  . insulin aspart  0-15 Units Subcutaneous TID WC  . pneumococcal 23 valent vaccine  0.5 mL Intramuscular Tomorrow-1000  . predniSONE  10 mg Oral Q breakfast   Continuous Infusions: . sodium chloride 50 mL/hr at 04/25/13 1520   Time spent: 35 min Anaih Brander L  [facility] Pager [phone]. If 7PM-7AM, please contact night-coverage at [email or web address], password [facility] 04/26/2013, 9:37 AM  LOS: 1 day

## 2013-04-26 NOTE — Evaluation (Signed)
Physical Therapy Evaluation Patient Details Name: Elizabeth Drake MRN: [id number] DOB: 1941/06/26 Today's Date: 04/26/2013 Time: 3664-4034 PT Time Calculation (min): 17 min  PT Assessment / Plan / Recommendation History of Present Illness  Elizabeth Drake is a 72 y.o. well-controlled diabetic female who presented to her primary care provider with chief complaint of left foot and toe redness and swelling 2 days ago. Patient has had progressive redness and swelling of the left foot in particular the left second toe over the last 2 days; Found to have cellulits L foot and toe; alos with DM, sarcoidosis, and has been at wheelchair and transfer functional level for over a year  Clinical Impression  Pt admitted with above. Pt currently with functional limitations due to the deficits listed below (see PT Problem List).  Pt will benefit from skilled PT to increase their independence and safety with mobility to allow discharge to the venue listed below.       PT Assessment  Patient needs continued PT services    Follow Up Recommendations  Home health PT;Supervision/Assistance - 24 hour (may not need HHPT; will cont to assess)    Does the patient have the potential to tolerate intense rehabilitation      Barriers to Discharge        Equipment Recommendations  None recommended by PT    Recommendations for Other Services     Frequency Min 3X/week    Precautions / Restrictions Precautions Precautions: Fall   Pertinent Vitals/Pain 8/10 pain though still willing to get up elevated for edema and pain control       Mobility  Bed Mobility Bed Mobility: Supine to Sit;Sitting - Scoot to Edge of Bed Supine to Sit: 4: Min guard (without physical contact) Sitting - Scoot to Edge of Bed: 4: Min guard Details for Bed Mobility Assistance: Overall moves well; is clearly quite experienced in moving self, and managing scooting to EOB even though she has decr sitting balance; heavily dependent on UE  support Transfers Transfers: Lobbyist Pivot Transfers: 4: Min guard;With upper extremity assistance Details for Transfer Assistance: Minguard for safety; Pt able to clear armrest of recliner during transfer; Quite painful, but managing transfer well Ambulation/Gait Ambulation/Gait Assistance: Not tested (comment)    Exercises     PT Diagnosis: Generalized weakness;Acute pain  PT Problem List: Decreased strength;Decreased activity tolerance;Decreased balance;Decreased mobility;Pain;Decreased knowledge of precautions PT Treatment Interventions: DME instruction;Functional mobility training;Therapeutic activities;Therapeutic exercise;Balance training;Neuromuscular re-education;Patient/family education     PT Goals(Current goals can be found in the care plan section) Acute Rehab PT Goals Patient Stated Goal: less pian in foot PT Goal Formulation: With patient Time For Goal Achievement: 05/03/13 Potential to Achieve Goals: Good  Visit Information  Last PT Received On: 04/26/13 Assistance Needed: +1 History of Present Illness: Elizabeth Drake is a 72 y.o. well-controlled diabetic female who presented to her primary care provider with chief complaint of left foot and toe redness and swelling 2 days ago. Patient has had progressive redness and swelling of the left foot in particular the left second toe over the last 2 days; Found to have cellulits L foot and toe; alos with DM, sarcoidosis, and has been at wheelchair and transfer functional level for over a year       Prior Functioning  Home Living Family/patient expects to be discharged to:: Private residence Living Arrangements: Spouse/significant other Available Help at Discharge: Family;Available 24 hours/day Type of Home: House Home Access: Ramped entrance Home Layout: One  level Home Equipment: Wheelchair - manual (not sure about bathroom setup) Prior Function Level of Independence: Independent with assistive  device(s) Comments: modified independent at wheelchair transfer level Communication Communication: No difficulties    Cognition  Cognition Arousal/Alertness: Awake/alert Behavior During Therapy: WFL for tasks assessed/performed Overall Cognitive Status: Within Functional Limits for tasks assessed    Extremity/Trunk Assessment Upper Extremity Assessment Upper Extremity Assessment: Overall WFL for tasks assessed Lower Extremity Assessment Lower Extremity Assessment: RLE deficits/detail;LLE deficits/detail RLE Deficits / Details: 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 pt; she frequently uses her arms to move her legs during functional activity and to get comfortable LLE Deficits / Details: 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 pt; she frequently uses her arms to move her legs during functional activity and to get comfortable; her foot and toes are wrapped in an ACE-type bandage   Balance Balance Balance Assessed: Yes Dynamic Sitting Balance Dynamic Sitting - Balance Support: Right upper extremity supported;Left upper extremity supported Dynamic Sitting - Level of Assistance: 5: Stand by assistance Dynamic Sitting - Comments: Dependent on UE support for sitting balance during reciprocal scoot to EOB  End of Session PT - End of Session Activity Tolerance: Patient tolerated treatment well Patient left: in chair;with call bell/phone within reach  GP     [facility] [place], [place] [phone]  04/26/2013, 5:50 PM

## 2013-04-27 DIAGNOSIS — E119 Type 2 diabetes mellitus without complications: Secondary | ICD-10-CM | POA: Diagnosis not present

## 2013-04-27 DIAGNOSIS — L02619 Cutaneous abscess of unspecified foot: Secondary | ICD-10-CM | POA: Diagnosis not present

## 2013-04-27 DIAGNOSIS — G8220 Paraplegia, unspecified: Secondary | ICD-10-CM | POA: Diagnosis not present

## 2013-04-27 DIAGNOSIS — D869 Sarcoidosis, unspecified: Secondary | ICD-10-CM | POA: Diagnosis not present

## 2013-04-27 LAB — GLUCOSE, CAPILLARY: Glucose-Capillary: 106 mg/dL — ABNORMAL HIGH (ref 70–99)

## 2013-04-27 MED ORDER — "SILVERSEAL HYDROGEL DRESSING 2""X3"" EX PADS": 1.0000 | MEDICATED_PAD | Freq: Every day | CUTANEOUS | Status: DC

## 2013-04-27 NOTE — Discharge Summary (Signed)
Physician Discharge Summary  Elizabeth Drake VVO:[id number] DOB: [birth year] DOA: 04/25/2013  PCP: Rudi Heap, MD  Admit date: 04/25/2013 Discharge date: 04/27/2013  Time spent: greater than 30 minutes  Recommendations for Outpatient Follow-up:  1. Wound check one week  Discharge Diagnoses:  Principal Problem:   Cellulitis of toe of left foot with ulcer Active Problems:   DM type 2 (diabetes mellitus, type 2)   Hyperlipidemia   Sarcoidosis   Paraparesis   Wheelchair bound   Discharge Condition: stable  Filed Weights   04/25/13 1251  Weight: 52.164 kg (115 lb)    History of present illness:  Elizabeth Drake is a 72 y.o. well-controlled diabetic female who presented to her primary care provider with chief complaint of left foot and toe redness and swelling 2 days ago. Patient has had progressive redness and swelling of the left foot in particular the left second toe over the last 2 days. Patient denies any known trauma. Patient had been using an unknown OTC cream over her feet around this time frame and is unsure this is what may caused the problem. Pt is wheelchair bound. Patient has history of hammertoe surgery of the left second toe which is now red and infected. It has a yellow tip and associated with warmth and redness. Patient noticed some mucopurulent drainage of the left toe over the course of the past 24 hours. Patient denies any significant pain. No fevers or chills. Patient also takes daily macrobid for her recurrent urinary tract infections. Pt was given ceftriaxone IM in the office and followed up the next day. The cellulitis area had been marked with ink and there had been no significant improvement. Also, the patient had outpatient xrays of the left foot and toe that revealed no osteomyelitis. The patient denied fever and chills. She had been prescribed cipro and doxy and had started taking that. Because she continued to have significant redness and there was a question of  possible spread of the area, hospitalization was requested for IV antibiotics.   Hospital Course:  Patient was admitted to hospitalist service. Started on Unasyn. Vancomycin subsequently started as well. Patient's cellulitis receded by the time of discharge she had minimal erythema involving the second toe only. She was noted to have ulceration on the second toe without any drainage. woundculture shows MRSA, but as patient has no drainage, could be colonization. A Darco shoe has been provided. Patient is feeling much better. She is afebrile and tolerating a diet. Requesting discharge. All other medical issues remained stable during the hospitalization.  Wound care nurse has recommended aquacel dressings  Procedures:  none  Consultations:  WOC  Discharge Exam: Filed Vitals:   04/27/13 0656  BP: 156/81  Pulse: 87  Temp: 97.5 F (36.4 C)  Resp: 16    General: Comfortable. Nontoxic appearing. Alert and oriented. Cardiovascular: Regular rate rhythm without murmurs gallops rubs. Respiratory: Clear to auscultation bilaterally without wheeze rhonchi or rales. Extremities: Involved to with minimal erythema. Ulceration cleaner. No odor. No drainage. No tenderness. No fluctuance. Cellulitis involving the foot has completely receded.  Discharge Instructions  Discharge Orders   Future Orders Complete By Expires   Activity as tolerated - No restrictions  As directed    Diet Carb Modified  As directed        Medication List         ciprofloxacin 500 MG tablet  Commonly known as:  CIPRO  Take 1 tablet (500 mg total) by mouth 2 (two)  times daily.     darifenacin 15 MG 24 hr tablet  Commonly known as:  ENABLEX  Take 15 mg by mouth daily.     doxycycline 100 MG capsule  Commonly known as:  VIBRAMYCIN  Take 1 capsule (100 mg total) by mouth 2 (two) times daily.     glimepiride 2 MG tablet  Commonly known as:  AMARYL  Take 1 tablet (2 mg total) by mouth daily.      HYDROcodone-acetaminophen 5-325 MG per tablet  Commonly known as:  NORCO/VICODIN  Take 1 tablet by mouth every 6 (six) hours as needed for pain.     metFORMIN 500 MG tablet  Commonly known as:  GLUCOPHAGE  Take 1 tablet (500 mg total) by mouth daily with breakfast.     multivitamin with minerals Tabs tablet  Take 1 tablet by mouth daily.     nitrofurantoin 100 MG capsule  Commonly known as:  MACRODANTIN  Take 100 mg by mouth at bedtime.     OSCAL 500/200 D-3 500-200 MG-UNIT per tablet  Generic drug:  calcium-vitamin D  Take 1 tablet by mouth daily with breakfast.     predniSONE 10 MG tablet  Commonly known as:  DELTASONE  Take 10 mg by mouth daily with breakfast.       Allergies  Allergen Reactions  . Advil [Ibuprofen] Shortness Of Breath and Swelling  . Azo [Phenazopyridine] Other (See Comments)    Unknown reaction  . Indocin [Indomethacin] Other (See Comments)    Unknown reaction  . Loratadine Other (See Comments)    unknown  . Percocet [Oxycodone-Acetaminophen] Itching       Follow-up Information   Follow up with Rudi Heap, MD In 1 week.   Specialty:  Family Medicine   Contact information:   [address] [place] [address] [phone]        The results of significant diagnostics from this hospitalization (including imaging, microbiology, ancillary and laboratory) are listed below for reference.    Significant Diagnostic Studies: Dg Foot Complete Left  04/24/2013   *RADIOLOGY REPORT*  Clinical Data: Erythema involving the left second toe, open wound, evaluate for osteomyelitis, initial encounter.  LEFT FOOT - COMPLETE 3+ VIEW  Comparison: None.  Findings:  No definite fracture or dislocation.  Hammertoe deformities are seen involving the second through fifth digits.  Joint spaces appear preserved.  No definite erosions. No discrete areas of osteolysis to suggest osteomyelitis, though evaluation of the digits is degraded due to obliquity.  There is  possible mild diffuse soft tissue swelling about the foot.  No radiopaque foreign body.  No discrete areas of subcutaneous emphysema.  Small plantar calcaneal spur.  IMPRESSION: 1.  No definite fracture or dislocation.  2.  While there are no definitive evidence of osteomyelitis, evaluation of the digits is degraded due to obliquity.  Further evaluation may be obtained with dedicated radiographs of the digit of interest as clinically indicated.   Original Report Authenticated By: Tacey Ruiz, MD   Dg Toe 2nd Left  04/24/2013   CLINICAL DATA:  Possible osteomyelitis left 2nd toe  EXAM: LEFT SECOND TOE  COMPARISON:  None.  FINDINGS: Three views of the left 2nd toe submitted. No acute fracture or subluxation. There is diffuse osteopenia. Soft tissue swelling noted 2nd toe. No periosteal reaction or bone destruction to suggest definite osteomyelitis.  IMPRESSION: No periosteal reaction or bone destruction to suggest definite osteomyelitis. Soft tissue swelling is noted.   Electronically Signed  ByNatasha Mead   On: 04/24/2013 13:04    Microbiology: Recent Results (from the past 240 hour(s))  AEROBIC CULTURE     Status: Abnormal   Collection Time    04/24/13  4:31 PM      Result Value Range Status   Aerobic Bacterial Culture Final report (*)  Final   Result 1 Staphylococcus aureus (*)  Final   Comment: Moderate growth     Methicillin resistant (MRSA)     Based on resistance to oxacillin this isolate would be resistant to     all currently available beta-lactam antimicrobial agents, with the     exception of the newer cephalosporins with anti-MRSA activity, such as     Ceftaroline   ANTIMICROBIAL SUSCEPTIBILITY Comment   Final   Comment:    S = Susceptible; I = Intermediate; R = Resistant                         P = Positive; N = Negative                 MICS are expressed in micrograms per mL        Antibiotic                 RSLT#1    RSLT#2    RSLT#3    RSLT#4     Ciprofloxacin                   R     Clindamycin                    R     Erythromycin                   R     Gentamicin                     S     Levofloxacin                   I     Linezolid                      S     Oxacillin                      R     Penicillin                     R     Rifampin                       S     Tetracycline                   R     Trimethoprim/Sulfa             S     Vancomycin                     S  CULTURE, BLOOD (ROUTINE X 2)     Status: None   Collection Time    04/25/13  4:00 PM      Result Value Range Status   Specimen Description BLOOD RIGHT ARM   Final   Special Requests BOTTLES DRAWN AEROBIC AND ANAEROBIC 10CC   Final   Culture  Setup Time  Final   Value: 04/26/2013 02:31     Performed at [facility]   Culture     Final   Value:        BLOOD CULTURE RECEIVED NO GROWTH TO DATE CULTURE WILL BE HELD FOR 5 DAYS BEFORE ISSUING A FINAL NEGATIVE REPORT     Performed at [facility]   Report Status PENDING   Incomplete  CULTURE, BLOOD (ROUTINE X 2)     Status: None   Collection Time    04/25/13  4:15 PM      Result Value Range Status   Specimen Description BLOOD RIGHT ARM   Final   Special Requests BOTTLES DRAWN AEROBIC AND ANAEROBIC 10CC   Final   Culture  Setup Time     Final   Value: 04/26/2013 02:31     Performed at [facility]   Culture     Final   Value:        BLOOD CULTURE RECEIVED NO GROWTH TO DATE CULTURE WILL BE HELD FOR 5 DAYS BEFORE ISSUING A FINAL NEGATIVE REPORT     Performed at [facility]   Report Status PENDING   Incomplete  WOUND CULTURE     Status: None   Collection Time    04/25/13  4:32 PM      Result Value Range Status   Specimen Description WOUND   Final   Special Requests LEFT SECOND TOE PER  JILL MOORE,RN [id number]   Final   Gram Stain     Final   Value: RARE WBC PRESENT, PREDOMINANTLY PMN     NO SQUAMOUS EPITHELIAL CELLS SEEN     FEW GRAM POSITIVE COCCI IN PAIRS     IN CLUSTERS     Performed  at [facility]   Culture     Final   Value: MODERATE STAPHYLOCOCCUS AUREUS     Note: RIFAMPIN AND GENTAMICIN SHOULD NOT BE USED AS SINGLE DRUGS FOR TREATMENT OF STAPH INFECTIONS.     Performed at [facility]   Report Status PENDING   Incomplete     Labs: Basic Metabolic Panel:  Recent Labs Lab 04/24/13 1631 04/25/13 1600 04/26/13 0500  NA 138 137 138  K 4.1 4.0 3.6  CL 101 101 105  CO2 27 26 24   GLUCOSE 141* 306* 89  BUN 15 16 14   CREATININE 0.68 0.72 0.60  CALCIUM 10.1 9.1 9.2   Liver Function Tests:  Recent Labs Lab 04/24/13 1631 04/25/13 1600 04/26/13 0500  AST 18 14 13   ALT 14 11 9   ALKPHOS 47 46 40  BILITOT 0.6 0.6 0.6  PROT 6.5 6.6 6.0  ALBUMIN  --  3.4* 3.0*   No results found for this basename: LIPASE, AMYLASE,  in the last 168 hours No results found for this basename: AMMONIA,  in the last 168 hours CBC:  Recent Labs Lab 04/24/13 1151 04/25/13 1600 04/26/13 0500  WBC 13.1* 9.9 7.4  NEUTROABS  --  8.7* 4.3  HGB 13.4 13.1 12.4  HCT 38.6 38.2 36.6  MCV 95.1 97.7 97.6  PLT  --  199 214   Cardiac Enzymes: No results found for this basename: CKTOTAL, CKMB, CKMBINDEX, TROPONINI,  in the last 168 hours BNP: BNP (last 3 results) No results found for this basename: PROBNP,  in the last 8760 hours CBG:  Recent Labs Lab 04/26/13 0757 04/26/13 1213 04/26/13 1717 04/26/13 2217 04/27/13 0734  GLUCAP 89 154* 243* 117* 106*  Signed:  Christiane Ha  [facility] 04/27/2013, 11:01 AM

## 2013-04-27 NOTE — Consult Note (Signed)
WOC consult Note Reason for Consult: open wound second toe left foot. 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  However the toe is now deformed again.  She had an associated infection of the 2nd toe with erythema that extended on the dorsal foot, now much improved with the erythema confined to the toe mostly.  She does not have significant pain.  It is apparent she does have some fungal nail components and the 2nd toe nail is mostly separated from the nail bed, however I was not able to get the nail all the way off. For this reason I have suggested to her to move her routine podiatry appt. Up to have this addressed.  Dr. Lendell Caprice at the bedside with WOC today for assessment. Wound type: unclear etiology, DM well controlled, may be ill fitting shoe. She does not report trauma to the wound. She reports she spends most of the time in slippers and socks.  Measurement:1cm x 1cm ulcer at the tip of the 2nd left toe Wound ZOX:WRUE, moist, clean Drainage (amount, consistency, odor) minimal, non purulent  Periwound: intact with erythema as noted above Dressing procedure/placement/frequency: silver hydrofiber cut to fit and placed on the wound, wrap the first and second toe with conform to keep in place. Change daily.    Script to be provided by MD for Silver Hydrofiber dressing, she will go home with enough dressing to last her for a while. She can purchase wrap gauze OTC at local drug store or [employer].   Follow up with Dr. Ulice Brilliant as soon as possible to evaluate toe nail.    Discussed POC with patient and bedside nurse.  Re consult if needed, will not follow at this time. Thanks  Zacariah Belue [employer], CWOCN [phone])

## 2013-04-28 ENCOUNTER — Telehealth: Payer: Self-pay | Admitting: Family Medicine

## 2013-04-28 LAB — WOUND CULTURE

## 2013-04-28 MED ORDER — CEFTRIAXONE SODIUM 1 G IJ SOLR: 1.0000 g | INTRAMUSCULAR | Status: AC

## 2013-04-28 NOTE — Telephone Encounter (Signed)
Disagree and continue cipro

## 2013-04-28 NOTE — Addendum Note (Signed)
Addended by: Magdalene River on: 04/28/2013 05:11 PM   Modules accepted: Orders

## 2013-04-28 NOTE — Telephone Encounter (Signed)
Please review

## 2013-04-28 NOTE — Progress Notes (Signed)
UR completed.  Tyechia Allmendinger, RN BSN [facility] CCM Trauma/Neuro ICU Case Manager [phone]  

## 2013-05-01 NOTE — Telephone Encounter (Signed)
Pt notified to take meds as directed Verbalizes understanding

## 2013-05-02 LAB — CULTURE, BLOOD (ROUTINE X 2)
Culture: NO GROWTH
Culture: NO GROWTH

## 2013-05-04 ENCOUNTER — Other Ambulatory Visit: Payer: Self-pay | Admitting: Family Medicine

## 2013-05-04 ENCOUNTER — Encounter: Payer: Self-pay | Admitting: Family Medicine

## 2013-05-04 ENCOUNTER — Ambulatory Visit (INDEPENDENT_AMBULATORY_CARE_PROVIDER_SITE_OTHER): Payer: Medicare Other | Admitting: Family Medicine

## 2013-05-04 VITALS — BP 126/69 | HR 91 | Temp 99.7°F

## 2013-05-04 DIAGNOSIS — R110 Nausea: Secondary | ICD-10-CM | POA: Diagnosis not present

## 2013-05-04 DIAGNOSIS — L0390 Cellulitis, unspecified: Secondary | ICD-10-CM

## 2013-05-04 DIAGNOSIS — L0291 Cutaneous abscess, unspecified: Secondary | ICD-10-CM | POA: Diagnosis not present

## 2013-05-04 MED ORDER — ONDANSETRON HCL 8 MG PO TABS: 8.0000 mg | ORAL_TABLET | Freq: Three times a day (TID) | ORAL | Status: DC | PRN

## 2013-05-04 MED ORDER — SULFAMETHOXAZOLE-TMP DS 800-160 MG PO TABS: 1.0000 | ORAL_TABLET | Freq: Two times a day (BID) | ORAL | Status: DC

## 2013-05-04 MED ORDER — CIPROFLOXACIN HCL 500 MG PO TABS: 500.0000 mg | ORAL_TABLET | Freq: Two times a day (BID) | ORAL | Status: DC

## 2013-05-04 MED ORDER — DOXYCYCLINE HYCLATE 100 MG PO CAPS: 100.0000 mg | ORAL_CAPSULE | Freq: Two times a day (BID) | ORAL | Status: DC

## 2013-05-04 NOTE — Patient Instructions (Addendum)
Cellulitis Cellulitis is an infection of the skin and the tissue beneath it. The infected area is usually red and tender. Cellulitis occurs most often in the arms and lower legs.  CAUSES  Cellulitis is caused by bacteria that enter the skin through cracks or cuts in the skin. The most common types of bacteria that cause cellulitis are Staphylococcus and Streptococcus. SYMPTOMS   Redness and warmth.  Swelling.  Tenderness or pain.  Fever. DIAGNOSIS  Your caregiver can usually determine what is wrong based on a physical exam. Blood tests may also be done. TREATMENT  Treatment usually involves taking an antibiotic medicine. HOME CARE INSTRUCTIONS   Take your antibiotics as directed. Finish them even if you start to feel better.  Keep the infected arm or leg elevated to reduce swelling.  Apply a warm cloth to the affected area up to 4 times per day to relieve pain.  Only take over-the-counter or prescription medicines for pain, discomfort, or fever as directed by your caregiver.  Keep all follow-up appointments as directed by your caregiver. SEEK MEDICAL CARE IF:   You notice red streaks coming from the infected area.  Your red area gets larger or turns dark in color.  Your bone or joint underneath the infected area becomes painful after the skin has healed.  Your infection returns in the same area or another area.  You notice a swollen bump in the infected area.  You develop new symptoms. SEEK IMMEDIATE MEDICAL CARE IF:   You have a fever.  You feel very sleepy.  You develop vomiting or diarrhea.  You have a general ill feeling (malaise) with muscle aches and pains. MAKE SURE YOU:   Understand these instructions.  Will watch your condition.  Will get help right away if you are not doing well or get worse. Document Released: 05/02/2005 Document Revised: 01/22/2012 Document Reviewed: 10/08/2011 Exit[employer] Patient Information 2014 [employer].  

## 2013-05-04 NOTE — Progress Notes (Signed)
  Subjective:    Patient ID: Elizabeth Drake, female    DOB: 23-Mar-1941, 72 y.o.   MRN: [id number]  HPI This 72 y.o. female presents for evaluation of hospital follow up.  She was admitted to Hospital for cellulitis of her left foot and second toe.  She has DM and had wound on her second Distal toe and failed oral abx tx so she was admitted and received IV vanco and unasyn.  She also received tx of her diabetes with SSI coverage.  She was discharged home one week ago on oral Doxy and cipro and has been doing very well.  She reports some nausea.   Review of Systems C/o cellulitis left foot and second toe. No chest pain, SOB, HA, dizziness, vision change, N/V, diarrhea, constipation, dysuria, urinary urgency or frequency, myalgias, arthralgias or rash.     Objective:   Physical Exam Vital signs noted  Well developed well nourished female in wheelchair.  HEENT - Head atraumatic Normocephalic                Eyes - PERRLA, Conjuctiva - clear Sclera- Clear EOMI                Throat - oropharanx wnl Respiratory - Lungs CTA bilateral Cardiac - RRR S1 and S2 without murmur Skin - Left foot with mild erythema and no swelling.  Left toe without swelling and mild erythema Left second Toenail is sloughing but still secure.  Some erythema in the left foot and toe but Overall looking a lot better.  No drainage from the toe is noted.       Assessment & Plan:  Cellulitis - Plan: doxycycline (VIBRAMYCIN) 100 MG capsule bid x 7 days, ciprofloxacin (CIPRO) 500 MG tablet bid x 7 days Overall her foot and toe are looking a lot better.  Recommend she put neosporin ointment to distal toe and toenail.   Would wait to get toenail removed until foot has healed completely.  Discussed with patient and husband that toenail will probably fall off on its own.  Nausea alone - Plan: ondansetron (ZOFRAN) 8 MG tablet  Deatra Canter FNP

## 2013-05-14 ENCOUNTER — Ambulatory Visit (INDEPENDENT_AMBULATORY_CARE_PROVIDER_SITE_OTHER): Payer: Medicare Other | Admitting: Family Medicine

## 2013-05-14 ENCOUNTER — Encounter: Payer: Self-pay | Admitting: Family Medicine

## 2013-05-14 VITALS — BP 111/64 | HR 86 | Temp 97.1°F

## 2013-05-14 DIAGNOSIS — L0291 Cutaneous abscess, unspecified: Secondary | ICD-10-CM | POA: Diagnosis not present

## 2013-05-14 DIAGNOSIS — L0390 Cellulitis, unspecified: Secondary | ICD-10-CM

## 2013-05-14 NOTE — Patient Instructions (Signed)
Cellulitis Cellulitis is an infection of the skin and the tissue beneath it. The infected area is usually red and tender. Cellulitis occurs most often in the arms and lower legs.  CAUSES  Cellulitis is caused by bacteria that enter the skin through cracks or cuts in the skin. The most common types of bacteria that cause cellulitis are Staphylococcus and Streptococcus. SYMPTOMS   Redness and warmth.  Swelling.  Tenderness or pain.  Fever. DIAGNOSIS  Your caregiver can usually determine what is wrong based on a physical exam. Blood tests may also be done. TREATMENT  Treatment usually involves taking an antibiotic medicine. HOME CARE INSTRUCTIONS   Take your antibiotics as directed. Finish them even if you start to feel better.  Keep the infected arm or leg elevated to reduce swelling.  Apply a warm cloth to the affected area up to 4 times per day to relieve pain.  Only take over-the-counter or prescription medicines for pain, discomfort, or fever as directed by your caregiver.  Keep all follow-up appointments as directed by your caregiver. SEEK MEDICAL CARE IF:   You notice red streaks coming from the infected area.  Your red area gets larger or turns dark in color.  Your bone or joint underneath the infected area becomes painful after the skin has healed.  Your infection returns in the same area or another area.  You notice a swollen bump in the infected area.  You develop new symptoms. SEEK IMMEDIATE MEDICAL CARE IF:   You have a fever.  You feel very sleepy.  You develop vomiting or diarrhea.  You have a general ill feeling (malaise) with muscle aches and pains. MAKE SURE YOU:   Understand these instructions.  Will watch your condition.  Will get help right away if you are not doing well or get worse. Document Released: 05/02/2005 Document Revised: 01/22/2012 Document Reviewed: 10/08/2011 [employer] Patient Information 2014 [employer].  

## 2013-05-14 NOTE — Progress Notes (Signed)
  Subjective:    Patient ID: Elizabeth Drake, female    DOB: [birth year], 72 y.o.   MRN: [id number]  HPI This 72 y.o. female presents for evaluation of follow up on left toe cellulits. She finished her otc abx's and she is here for follow up.  She was admitted a few weeks ago.   Review of Systems No chest pain, SOB, HA, dizziness, vision change, N/V, diarrhea, constipation, dysuria, urinary urgency or frequency, myalgias, arthralgias or rash.     Objective:   Physical Exam Vital signs noted  Well developed well nourished female.  HEENT - Head atraumatic Normocephalic                Throat - oropharanx wnl Respiratory - Lungs CTA bilateral Skin - Left 2nd toe is well healed and toenail has come off      Assessment & Plan:  Cellulitis - Resolved and instructed to keep open to air  Deatra Canter FNP

## 2013-05-15 ENCOUNTER — Telehealth: Payer: Self-pay | Admitting: Nurse Practitioner

## 2013-05-18 ENCOUNTER — Other Ambulatory Visit: Payer: Self-pay | Admitting: Pharmacist

## 2013-05-18 NOTE — Telephone Encounter (Signed)
APPT MADE

## 2013-05-18 NOTE — Telephone Encounter (Signed)
Opened in error erranous encounter

## 2013-05-26 ENCOUNTER — Other Ambulatory Visit: Payer: Self-pay

## 2013-05-26 MED ORDER — GLIMEPIRIDE 2 MG PO TABS: 2.0000 mg | ORAL_TABLET | Freq: Every day | ORAL | Status: DC

## 2013-06-03 DIAGNOSIS — D869 Sarcoidosis, unspecified: Secondary | ICD-10-CM | POA: Diagnosis not present

## 2013-06-03 DIAGNOSIS — H2510 Age-related nuclear cataract, unspecified eye: Secondary | ICD-10-CM | POA: Diagnosis not present

## 2013-06-03 DIAGNOSIS — H43819 Vitreous degeneration, unspecified eye: Secondary | ICD-10-CM | POA: Diagnosis not present

## 2013-06-03 DIAGNOSIS — H348392 Tributary (branch) retinal vein occlusion, unspecified eye, stable: Secondary | ICD-10-CM | POA: Diagnosis not present

## 2013-06-11 ENCOUNTER — Ambulatory Visit: Payer: Medicare Other | Admitting: Nurse Practitioner

## 2013-06-16 DIAGNOSIS — L851 Acquired keratosis [keratoderma] palmaris et plantaris: Secondary | ICD-10-CM | POA: Diagnosis not present

## 2013-06-16 DIAGNOSIS — E1159 Type 2 diabetes mellitus with other circulatory complications: Secondary | ICD-10-CM | POA: Diagnosis not present

## 2013-06-16 DIAGNOSIS — B351 Tinea unguium: Secondary | ICD-10-CM | POA: Diagnosis not present

## 2013-06-16 DIAGNOSIS — L03039 Cellulitis of unspecified toe: Secondary | ICD-10-CM | POA: Diagnosis not present

## 2013-06-23 DIAGNOSIS — B351 Tinea unguium: Secondary | ICD-10-CM | POA: Diagnosis not present

## 2013-06-23 DIAGNOSIS — L851 Acquired keratosis [keratoderma] palmaris et plantaris: Secondary | ICD-10-CM | POA: Diagnosis not present

## 2013-06-23 DIAGNOSIS — E1159 Type 2 diabetes mellitus with other circulatory complications: Secondary | ICD-10-CM | POA: Diagnosis not present

## 2013-06-23 DIAGNOSIS — L03039 Cellulitis of unspecified toe: Secondary | ICD-10-CM | POA: Diagnosis not present

## 2013-06-24 ENCOUNTER — Encounter (HOSPITAL_COMMUNITY): Payer: Self-pay | Admitting: Emergency Medicine

## 2013-06-24 ENCOUNTER — Emergency Department (HOSPITAL_COMMUNITY): Payer: Medicare Other

## 2013-06-24 ENCOUNTER — Inpatient Hospital Stay (HOSPITAL_COMMUNITY)
Admission: EM | Admit: 2013-06-24 | Discharge: 2013-06-30 | DRG: 175 | Disposition: A | Discharge status: Rehabilitation Facility | Payer: Medicare Other | Attending: Internal Medicine | Admitting: Internal Medicine

## 2013-06-24 DIAGNOSIS — T380X5A Adverse effect of glucocorticoids and synthetic analogues, initial encounter: Secondary | ICD-10-CM | POA: Diagnosis present

## 2013-06-24 DIAGNOSIS — R0789 Other chest pain: Secondary | ICD-10-CM | POA: Diagnosis not present

## 2013-06-24 DIAGNOSIS — N390 Urinary tract infection, site not specified: Secondary | ICD-10-CM | POA: Diagnosis not present

## 2013-06-24 DIAGNOSIS — G8929 Other chronic pain: Secondary | ICD-10-CM | POA: Diagnosis present

## 2013-06-24 DIAGNOSIS — L02619 Cutaneous abscess of unspecified foot: Secondary | ICD-10-CM | POA: Diagnosis not present

## 2013-06-24 DIAGNOSIS — I2699 Other pulmonary embolism without acute cor pulmonale: Secondary | ICD-10-CM | POA: Diagnosis not present

## 2013-06-24 DIAGNOSIS — E2749 Other adrenocortical insufficiency: Secondary | ICD-10-CM | POA: Diagnosis present

## 2013-06-24 DIAGNOSIS — E1149 Type 2 diabetes mellitus with other diabetic neurological complication: Secondary | ICD-10-CM | POA: Diagnosis present

## 2013-06-24 DIAGNOSIS — E1310 Other specified diabetes mellitus with ketoacidosis without coma: Secondary | ICD-10-CM | POA: Diagnosis not present

## 2013-06-24 DIAGNOSIS — M549 Dorsalgia, unspecified: Secondary | ICD-10-CM | POA: Diagnosis not present

## 2013-06-24 DIAGNOSIS — G8220 Paraplegia, unspecified: Secondary | ICD-10-CM

## 2013-06-24 DIAGNOSIS — D696 Thrombocytopenia, unspecified: Secondary | ICD-10-CM | POA: Diagnosis present

## 2013-06-24 DIAGNOSIS — N318 Other neuromuscular dysfunction of bladder: Secondary | ICD-10-CM | POA: Diagnosis present

## 2013-06-24 DIAGNOSIS — E1142 Type 2 diabetes mellitus with diabetic polyneuropathy: Secondary | ICD-10-CM | POA: Diagnosis present

## 2013-06-24 DIAGNOSIS — L03039 Cellulitis of unspecified toe: Secondary | ICD-10-CM | POA: Diagnosis not present

## 2013-06-24 DIAGNOSIS — D869 Sarcoidosis, unspecified: Secondary | ICD-10-CM

## 2013-06-24 DIAGNOSIS — M810 Age-related osteoporosis without current pathological fracture: Secondary | ICD-10-CM

## 2013-06-24 DIAGNOSIS — M1990 Unspecified osteoarthritis, unspecified site: Secondary | ICD-10-CM

## 2013-06-24 DIAGNOSIS — Z86711 Personal history of pulmonary embolism: Secondary | ICD-10-CM | POA: Diagnosis present

## 2013-06-24 DIAGNOSIS — I498 Other specified cardiac arrhythmias: Secondary | ICD-10-CM | POA: Diagnosis not present

## 2013-06-24 DIAGNOSIS — Z79899 Other long term (current) drug therapy: Secondary | ICD-10-CM

## 2013-06-24 DIAGNOSIS — K5900 Constipation, unspecified: Secondary | ICD-10-CM | POA: Diagnosis not present

## 2013-06-24 DIAGNOSIS — L97509 Non-pressure chronic ulcer of other part of unspecified foot with unspecified severity: Secondary | ICD-10-CM | POA: Diagnosis present

## 2013-06-24 DIAGNOSIS — Z7901 Long term (current) use of anticoagulants: Secondary | ICD-10-CM

## 2013-06-24 DIAGNOSIS — D72829 Elevated white blood cell count, unspecified: Secondary | ICD-10-CM

## 2013-06-24 DIAGNOSIS — I10 Essential (primary) hypertension: Secondary | ICD-10-CM | POA: Diagnosis present

## 2013-06-24 DIAGNOSIS — I369 Nonrheumatic tricuspid valve disorder, unspecified: Secondary | ICD-10-CM | POA: Diagnosis not present

## 2013-06-24 DIAGNOSIS — Z96698 Presence of other orthopedic joint implants: Secondary | ICD-10-CM

## 2013-06-24 DIAGNOSIS — M4130 Thoracogenic scoliosis, site unspecified: Secondary | ICD-10-CM | POA: Diagnosis present

## 2013-06-24 DIAGNOSIS — E785 Hyperlipidemia, unspecified: Secondary | ICD-10-CM | POA: Diagnosis present

## 2013-06-24 DIAGNOSIS — I824Y9 Acute embolism and thrombosis of unspecified deep veins of unspecified proximal lower extremity: Secondary | ICD-10-CM | POA: Diagnosis present

## 2013-06-24 DIAGNOSIS — R0602 Shortness of breath: Secondary | ICD-10-CM | POA: Diagnosis not present

## 2013-06-24 DIAGNOSIS — R000 Tachycardia, unspecified: Secondary | ICD-10-CM | POA: Diagnosis not present

## 2013-06-24 DIAGNOSIS — Z888 Allergy status to other drugs, medicaments and biological substances status: Secondary | ICD-10-CM

## 2013-06-24 DIAGNOSIS — IMO0002 Reserved for concepts with insufficient information to code with codable children: Secondary | ICD-10-CM

## 2013-06-24 DIAGNOSIS — L03032 Cellulitis of left toe: Secondary | ICD-10-CM

## 2013-06-24 DIAGNOSIS — Z66 Do not resuscitate: Secondary | ICD-10-CM | POA: Diagnosis present

## 2013-06-24 DIAGNOSIS — R209 Unspecified disturbances of skin sensation: Secondary | ICD-10-CM | POA: Diagnosis not present

## 2013-06-24 DIAGNOSIS — G894 Chronic pain syndrome: Secondary | ICD-10-CM | POA: Diagnosis present

## 2013-06-24 DIAGNOSIS — Z87891 Personal history of nicotine dependence: Secondary | ICD-10-CM

## 2013-06-24 DIAGNOSIS — N3281 Overactive bladder: Secondary | ICD-10-CM

## 2013-06-24 DIAGNOSIS — I959 Hypotension, unspecified: Secondary | ICD-10-CM | POA: Diagnosis present

## 2013-06-24 DIAGNOSIS — M545 Low back pain, unspecified: Secondary | ICD-10-CM | POA: Diagnosis present

## 2013-06-24 DIAGNOSIS — L03115 Cellulitis of right lower limb: Secondary | ICD-10-CM

## 2013-06-24 DIAGNOSIS — R5381 Other malaise: Secondary | ICD-10-CM | POA: Diagnosis not present

## 2013-06-24 DIAGNOSIS — E1110 Type 2 diabetes mellitus with ketoacidosis without coma: Secondary | ICD-10-CM

## 2013-06-24 DIAGNOSIS — E119 Type 2 diabetes mellitus without complications: Secondary | ICD-10-CM | POA: Diagnosis not present

## 2013-06-24 DIAGNOSIS — R0902 Hypoxemia: Secondary | ICD-10-CM

## 2013-06-24 DIAGNOSIS — R339 Retention of urine, unspecified: Secondary | ICD-10-CM | POA: Diagnosis not present

## 2013-06-24 DIAGNOSIS — Z8249 Family history of ischemic heart disease and other diseases of the circulatory system: Secondary | ICD-10-CM

## 2013-06-24 DIAGNOSIS — Z993 Dependence on wheelchair: Secondary | ICD-10-CM

## 2013-06-24 LAB — CBC WITH DIFFERENTIAL/PLATELET
Basophils Absolute: 0 10*3/uL (ref 0.0–0.1)
Eosinophils Relative: 0 % (ref 0–5)
HCT: 42.3 % (ref 36.0–46.0)
Hemoglobin: 14 g/dL (ref 12.0–15.0)
Lymphs Abs: 1.5 10*3/uL (ref 0.7–4.0)
MCH: 33.3 pg (ref 26.0–34.0)
MCV: 100.7 fL — ABNORMAL HIGH (ref 78.0–100.0)
Monocytes Absolute: 0.4 10*3/uL (ref 0.1–1.0)
Monocytes Relative: 2 % — ABNORMAL LOW (ref 3–12)
Neutro Abs: 16.7 10*3/uL — ABNORMAL HIGH (ref 1.7–7.7)
RDW: 12.4 % (ref 11.5–15.5)
Smear Review: ADEQUATE
WBC: 18.6 10*3/uL — ABNORMAL HIGH (ref 4.0–10.5)

## 2013-06-24 LAB — BASIC METABOLIC PANEL
BUN: 22 mg/dL (ref 6–23)
Chloride: 100 mEq/L (ref 96–112)
GFR calc Af Amer: 79 mL/min — ABNORMAL LOW (ref >90)
GFR calc non Af Amer: 68 mL/min — ABNORMAL LOW (ref >90)
Glucose, Bld: 417 mg/dL — ABNORMAL HIGH (ref 70–99)
Potassium: 3.8 mEq/L (ref 3.5–5.1)

## 2013-06-24 LAB — ANTITHROMBIN III: AntiThromb III Func: 98 % (ref 75–120)

## 2013-06-24 LAB — URINALYSIS, ROUTINE W REFLEX MICROSCOPIC
Bilirubin Urine: NEGATIVE
Ketones, ur: 40 mg/dL — AB
Nitrite: POSITIVE — AB
Specific Gravity, Urine: 1.031 — ABNORMAL HIGH (ref 1.005–1.030)
Urobilinogen, UA: 0.2 mg/dL (ref 0.0–1.0)
pH: 5 (ref 5.0–8.0)

## 2013-06-24 LAB — POCT I-STAT 3, VENOUS BLOOD GAS (G3P V)
O2 Saturation: 98 %
pCO2, Ven: 24.5 mmHg — ABNORMAL LOW (ref 45.0–50.0)
pH, Ven: 7.379 — ABNORMAL HIGH (ref 7.250–7.300)
pO2, Ven: 107 mmHg — ABNORMAL HIGH (ref 30.0–45.0)

## 2013-06-24 LAB — HOMOCYSTEINE: Homocysteine: 11.3 umol/L (ref 4.0–15.4)

## 2013-06-24 LAB — POCT I-STAT TROPONIN I: Troponin i, poc: 0.2 ng/mL (ref 0.00–0.08)

## 2013-06-24 LAB — URINE MICROSCOPIC-ADD ON

## 2013-06-24 LAB — GLUCOSE, CAPILLARY: Glucose-Capillary: 365 mg/dL — ABNORMAL HIGH (ref 70–99)

## 2013-06-24 LAB — TROPONIN I: Troponin I: <0.3 ng/mL (ref <0.30)

## 2013-06-24 MED ORDER — MUPIROCIN 2 % EX OINT
1.0000 "application " | TOPICAL_OINTMENT | Freq: Two times a day (BID) | CUTANEOUS | Status: DC
  Administered 2013-06-24 – 2013-06-30 (×12): 1 via NASAL
  Filled 2013-06-24 (×2): qty 22

## 2013-06-24 MED ORDER — DOXYCYCLINE HYCLATE 100 MG IV SOLR
100.0000 mg | Freq: Two times a day (BID) | INTRAVENOUS | Status: DC
  Administered 2013-06-24 – 2013-06-26 (×4): 100 mg via INTRAVENOUS
  Filled 2013-06-24 (×6): qty 100

## 2013-06-24 MED ORDER — POTASSIUM CHLORIDE IN NACL 20-0.9 MEQ/L-% IV SOLN
INTRAVENOUS | Status: DC
  Administered 2013-06-24: 21:00:00 via INTRAVENOUS
  Filled 2013-06-24 (×3): qty 1000

## 2013-06-24 MED ORDER — INSULIN REGULAR BOLUS VIA INFUSION
0.0000 [IU] | Freq: Three times a day (TID) | INTRAVENOUS | Status: DC
  Administered 2013-06-25 (×2): 1.1 [IU] via INTRAVENOUS
  Filled 2013-06-24: qty 10

## 2013-06-24 MED ORDER — SODIUM CHLORIDE 0.9 % IV SOLN
INTRAVENOUS | Status: DC
  Administered 2013-06-24: via INTRAVENOUS

## 2013-06-24 MED ORDER — ONDANSETRON HCL 4 MG PO TABS
8.0000 mg | ORAL_TABLET | Freq: Three times a day (TID) | ORAL | Status: DC | PRN
  Administered 2013-06-26: 8 mg via ORAL
  Filled 2013-06-24: qty 2

## 2013-06-24 MED ORDER — ALBUTEROL SULFATE (5 MG/ML) 0.5% IN NEBU
5.0000 mg | INHALATION_SOLUTION | Freq: Once | RESPIRATORY_TRACT | Status: AC
  Administered 2013-06-24: 5 mg via RESPIRATORY_TRACT
  Filled 2013-06-24: qty 1

## 2013-06-24 MED ORDER — DEXTROSE 50 % IV SOLN: 25.0000 mL | INTRAVENOUS | Status: DC | PRN

## 2013-06-24 MED ORDER — DEXTROSE 5 % IV SOLN: 1.0000 g | Freq: Once | INTRAVENOUS | Status: DC

## 2013-06-24 MED ORDER — POLYETHYLENE GLYCOL 3350 17 G PO PACK
17.0000 g | PACK | Freq: Every day | ORAL | Status: DC | PRN
  Filled 2013-06-24: qty 1

## 2013-06-24 MED ORDER — GUAIFENESIN-DM 100-10 MG/5ML PO SYRP: 5.0000 mL | ORAL_SOLUTION | ORAL | Status: DC | PRN

## 2013-06-24 MED ORDER — ADULT MULTIVITAMIN W/MINERALS CH
1.0000 | ORAL_TABLET | Freq: Every day | ORAL | Status: DC
  Administered 2013-06-24 – 2013-06-30 (×6): 1 via ORAL
  Filled 2013-06-24 (×7): qty 1

## 2013-06-24 MED ORDER — MORPHINE SULFATE 4 MG/ML IJ SOLN
4.0000 mg | Freq: Once | INTRAMUSCULAR | Status: AC
  Administered 2013-06-24: 4 mg via INTRAVENOUS
  Filled 2013-06-24: qty 1

## 2013-06-24 MED ORDER — HEPARIN BOLUS VIA INFUSION
3000.0000 [IU] | Freq: Once | INTRAVENOUS | Status: AC
  Administered 2013-06-24: 3000 [IU] via INTRAVENOUS
  Filled 2013-06-24: qty 3000

## 2013-06-24 MED ORDER — HYDROCODONE-ACETAMINOPHEN 5-325 MG PO TABS
1.0000 | ORAL_TABLET | ORAL | Status: DC | PRN
  Administered 2013-06-24 – 2013-06-26 (×4): 1 via ORAL
  Administered 2013-06-26: 2 via ORAL
  Administered 2013-06-26 – 2013-06-27 (×3): 1 via ORAL
  Administered 2013-06-27: 2 via ORAL
  Administered 2013-06-27: 1 via ORAL
  Administered 2013-06-27: 2 via ORAL
  Administered 2013-06-28: 1 via ORAL
  Administered 2013-06-28: 2 via ORAL
  Administered 2013-06-28: 1 via ORAL
  Administered 2013-06-29: 2 via ORAL
  Administered 2013-06-29: 1 via ORAL
  Administered 2013-06-29 (×3): 2 via ORAL
  Administered 2013-06-30: 1 via ORAL
  Filled 2013-06-24: qty 1
  Filled 2013-06-24: qty 2
  Filled 2013-06-24: qty 1
  Filled 2013-06-24: qty 2
  Filled 2013-06-24: qty 1
  Filled 2013-06-24: qty 2
  Filled 2013-06-24: qty 1
  Filled 2013-06-24: qty 2
  Filled 2013-06-24 (×2): qty 1
  Filled 2013-06-24: qty 2
  Filled 2013-06-24 (×3): qty 1
  Filled 2013-06-24: qty 2
  Filled 2013-06-24: qty 1
  Filled 2013-06-24: qty 2
  Filled 2013-06-24 (×4): qty 1

## 2013-06-24 MED ORDER — DARIFENACIN HYDROBROMIDE ER 15 MG PO TB24
15.0000 mg | ORAL_TABLET | Freq: Every day | ORAL | Status: DC
  Administered 2013-06-24 – 2013-06-30 (×7): 15 mg via ORAL
  Filled 2013-06-24 (×7): qty 1

## 2013-06-24 MED ORDER — ALBUTEROL SULFATE (5 MG/ML) 0.5% IN NEBU: 2.5000 mg | INHALATION_SOLUTION | RESPIRATORY_TRACT | Status: DC | PRN

## 2013-06-24 MED ORDER — HEPARIN (PORCINE) IN NACL 100-0.45 UNIT/ML-% IJ SOLN
800.0000 [IU]/h | INTRAMUSCULAR | Status: DC
  Administered 2013-06-24 – 2013-06-25 (×2): 800 [IU]/h via INTRAVENOUS
  Filled 2013-06-24 (×3): qty 250

## 2013-06-24 MED ORDER — CALCIUM CARBONATE-VITAMIN D 500-200 MG-UNIT PO TABS
1.0000 | ORAL_TABLET | Freq: Every day | ORAL | Status: DC
  Administered 2013-06-26 – 2013-06-30 (×5): 1 via ORAL
  Filled 2013-06-24 (×8): qty 1

## 2013-06-24 MED ORDER — DIPHENHYDRAMINE HCL 25 MG PO TABS
25.0000 mg | ORAL_TABLET | Freq: Every day | ORAL | Status: DC | PRN
  Filled 2013-06-24: qty 1

## 2013-06-24 MED ORDER — GLIMEPIRIDE 2 MG PO TABS
2.0000 mg | ORAL_TABLET | Freq: Every day | ORAL | Status: DC
  Administered 2013-06-24 – 2013-06-26 (×3): 2 mg via ORAL
  Filled 2013-06-24 (×3): qty 1

## 2013-06-24 MED ORDER — SODIUM CHLORIDE 0.9 % IV SOLN
INTRAVENOUS | Status: DC
  Filled 2013-06-24: qty 1

## 2013-06-24 MED ORDER — LORAZEPAM 2 MG/ML IJ SOLN
1.0000 mg | Freq: Once | INTRAMUSCULAR | Status: AC
  Administered 2013-06-24: 1 mg via INTRAVENOUS
  Filled 2013-06-24: qty 1

## 2013-06-24 MED ORDER — IOHEXOL 350 MG/ML SOLN
80.0000 mL | Freq: Once | INTRAVENOUS | Status: AC | PRN
  Administered 2013-06-24: 80 mL via INTRAVENOUS

## 2013-06-24 MED ORDER — SODIUM CHLORIDE 0.9 % IJ SOLN
3.0000 mL | Freq: Two times a day (BID) | INTRAMUSCULAR | Status: DC
  Administered 2013-06-24 – 2013-06-28 (×7): 3 mL via INTRAVENOUS

## 2013-06-24 MED ORDER — HYDROCORTISONE SOD SUCCINATE 100 MG IJ SOLR
100.0000 mg | Freq: Three times a day (TID) | INTRAMUSCULAR | Status: DC
  Administered 2013-06-24 – 2013-06-25 (×2): via INTRAVENOUS
  Filled 2013-06-24 (×5): qty 2

## 2013-06-24 MED ORDER — SODIUM CHLORIDE 0.9 % IV BOLUS (SEPSIS)
500.0000 mL | Freq: Once | INTRAVENOUS | Status: AC
  Administered 2013-06-24: 17:00:00 via INTRAVENOUS

## 2013-06-24 NOTE — Consult Note (Signed)
PULMONARY  / CRITICAL CARE MEDICINE  Name: Elizabeth Drake MRN: [id number] DOB: 08-Feb-1941    ADMISSION DATE:  06/24/2013 CONSULTATION DATE:  06/24/13  REFERRING MD :  EDP PRIMARY SERVICE: PCCM  CHIEF COMPLAINT:  PE and RV strain  BRIEF PATIENT DESCRIPTION: 72 y.o. Wheelchair bound female with with PMH of chronic low back pain, sarcoidosis, and DM presents to [facility] 11/19 via EMS with shortness of breath x2  days.    SIGNIFICANT EVENTS / STUDIES:  11/19 admitted 11/19 CT angio chest >> Multiple PEs arising from both arteries extending in bilateral lower lobes.  R>L, Mild RV strain  LINES / TUBES:   CULTURES: None  ANTIBIOTICS: None indicated at this time  HISTORY OF PRESENT ILLNESS:  72 y.o. Wheelchair bound female for 3 years with PMH of chronic low back pain, sarcoidosis, and DM presents to [facility] 11/19 via EMS with 2 day history of SOB and mild cough.  Shortness of breath is associated with chest pounding and cough is non-productive.  Patient was seen by Dr. Phylliss Bob for sarcoidosis.  Pt. states that sarcoid is well controlled.  She denies nausea, vomiting, fever, chills, abdominal pain, and dysuria.  Of note patient was seen two days ago and received abx for an infected toe.       PAST MEDICAL HISTORY :  Past Medical History  Diagnosis Date  . Sarcoidosis   . Hiatal hernia   . Diabetes mellitus without complication   . Osteoporosis    Past Surgical History  Procedure Laterality Date  . Abdominal hysterectomy    . Joint replacement      toe  . Lung lobectomy Left     Lower   Prior to Admission medications   Medication Sig Start Date End Date Taking? Authorizing Provider  calcium-vitamin D (OSCAL 500/200 D-3) 500-200 MG-UNIT per tablet Take 1 tablet by mouth daily with breakfast.   Yes Historical Provider, MD  darifenacin (ENABLEX) 15 MG 24 hr tablet Take 15 mg by mouth daily.   Yes Historical Provider, MD  diphenhydrAMINE (BENADRYL) 25 MG tablet Take 25 mg by mouth daily as  needed for allergies.   Yes Historical Provider, MD  glimepiride (AMARYL) 2 MG tablet Take 1 tablet (2 mg total) by mouth daily. 05/26/13  Yes Deatra Canter, FNP  HYDROcodone-acetaminophen (NORCO) 10-325 MG per tablet Take 1 tablet by mouth every 6 (six) hours as needed for severe pain.   Yes Historical Provider, MD  metFORMIN (GLUCOPHAGE) 500 MG tablet Take 1 tablet (500 mg total) by mouth daily with breakfast. 04/16/13  Yes Mary-Margaret Daphine Deutscher, FNP  Multiple Vitamin (MULTIVITAMIN WITH MINERALS) TABS tablet Take 1 tablet by mouth daily.   Yes Historical Provider, MD  mupirocin ointment (BACTROBAN) 2 % Place 1 application into the nose 2 (two) times daily. Apply to toes   Yes Historical Provider, MD  ondansetron (ZOFRAN) 8 MG tablet Take 1 tablet (8 mg total) by mouth every 8 (eight) hours as needed for nausea. 05/04/13  Yes Deatra Canter, FNP  predniSONE (DELTASONE) 5 MG tablet Take 10 mg by mouth daily with breakfast.   Yes Historical Provider, MD  trimethoprim (TRIMPEX) 100 MG tablet Take 100 mg by mouth at bedtime.   Yes Historical Provider, MD   Allergies  Allergen Reactions  . Advil [Ibuprofen] Shortness Of Breath and Swelling  . Azo [Phenazopyridine] Other (See Comments)    Unknown reaction  . Indocin [Indomethacin] Other (See Comments)    Unknown reaction  .  Loratadine Other (See Comments)    unknown  . Percocet [Oxycodone-Acetaminophen] Itching    FAMILY HISTORY:  Family History  Problem Relation Age of Onset  . Heart disease Mother   . Hypertension Mother   . Cancer Father     pancratic  . Cancer - Other Sister    SOCIAL HISTORY:  reports that she quit smoking about 12 years ago. Her smoking use included Cigarettes. She smoked 0.00 packs per day. She does not have any smokeless tobacco history on file. She reports that she does not drink alcohol or use illicit drugs.  REVIEW OF SYSTEMS:  See HPI  SUBJECTIVE:   VITAL SIGNS: Temp:  [99.6 F (37.6 C)] 99.6 F (37.6  C) (11/19 1123) Pulse Rate:  [54-131] 118 (11/19 1630) Resp:  [19-30] 25 (11/19 1630) BP: (104-147)/(52-90) 121/67 mmHg (11/19 1630) SpO2:  [90 %-100 %] 95 % (11/19 1630) FiO2 (%):  [0 %] 0 % (11/19 1141) HEMODYNAMICS:   VENTILATOR SETTINGS: Vent Mode:  [-]  FiO2 (%):  [0 %] 0 % INTAKE / OUTPUT: Intake/Output   None     PHYSICAL EXAMINATION: General:  Chronically ill appearing, mild distress Neuro:  A&Ox3, follows commands, appropriate speech, moves all extremities HEENT:  NCAT, MM dry, no JVD Cardiovascular:  Tachy, regular, no M/R/G Lungs:  CTA bilaterally, mild increased work of breathing on 2 L [place] Abdomen:  +BS, soft nontender Musculoskeletal:  No deformities, discoloration of 2nd toes Skin:  Old excoriation bilateral lower extremities  LABS:  Recent Labs Lab 06/24/13 1220 06/24/13 1400  HGB 14.0  --   WBC 18.6*  --   PLT 172  --   NA 140  --   K 3.8  --   CL 100  --   CO2 14*  --   GLUCOSE 417*  --   BUN 22  --   CREATININE 0.83  --   CALCIUM 9.7  --   TROPONINI  --  <0.30  PROBNP 9584.0*  --    No results found for this basename: GLUCAP,  in the last 168 hours  CXR: No acute infiltrates, cardiomegaly, old LLL resection noted  ASSESSMENT / PLAN:  PULMONARY A: Shortness of breath secondary to PE Sarcoidosis P:   - Supplemental O2 - goal sats > 92% - heparin drip per PE protocol  CARDIOVASCULAR A:  PE with RV strain HTN Tachycardia Chronic prednisone use P:  - TTE - EKG - Troponins - Stress dose steroids vs. Maintenance prednisone defer to [facility] - Not a candidate for thrombolytics as hemodynamically stable  HEMATOLOGIC A:   R/o hypercoagulability P:  - Heparin gtt - Hypercoag panel - cbc  Goals of care:  Patient full DNR status  Terri Piedra [employer] Physician Assistant Student 06/24/13, 5:24 PM  Today's summary:   72 y.o. Wheelchair bound female with with PMH of chronic low back pain, sarcoidosis, and DM presents  to [facility] 11/19 via EMS with shortness of breath x2  Days.  Multiple PEs found on CT angio.  Will place on heparin drip per PE protocol.  Not a TPA candidate.  Patient hemodynamically stable.  Patient will be admitted for Triad.  Will continue to follow as consult.  Alyson Reedy, M.D. Pulmonary and Critical Care Medicine [facility] Pager: [phone]  06/24/2013, 4:56 PM

## 2013-06-24 NOTE — ED Notes (Signed)
NOTIFIED DR. PICKERING IN PERSON OF PATIENTS LAB RESULTS I-STAT 3, BLOOD GAS VENOUS @14 :10PM , 06/24/2013.

## 2013-06-24 NOTE — ED Notes (Signed)
Pt husbands phone number: [phone]

## 2013-06-24 NOTE — ED Notes (Signed)
Dr. Kohut at bedside 

## 2013-06-24 NOTE — ED Provider Notes (Signed)
5:25 PM Assumed care from Dr Rubin Payor in sign out. CT significant for large PE with evidence of R heart strain. Heparin ordered. CC consulted. Pt's O2 saturations good on fairly minimal supplemental O2. Normo-to-mildly hypertensive. Will see pt in consultation, but do not feel she requires ICU admission at this time. Medicine consulted for admission.   CRITICAL CARE Performed by: Raeford Razor  Total critical care time: 30 minutes  Critical care time was exclusive of separately billable procedures and treating other patients. Critical care was necessary to treat or prevent imminent or life-threatening deterioration. Critical care was time spent personally by me on the following activities: development of treatment plan with patient and/or surrogate as well as nursing, discussions with consultants, evaluation of patient's response to treatment, examination of patient, obtaining history from patient or surrogate, ordering and performing treatments and interventions, ordering and review of laboratory studies, ordering and review of radiographic studies, pulse oximetry and re-evaluation of patient's condition.   Raeford Razor, MD 06/24/13 1728

## 2013-06-24 NOTE — Progress Notes (Signed)
ANTICOAGULATION CONSULT NOTE - Initial Consult  Pharmacy Consult for heparin Indication: pulmonary embolus  Allergies  Allergen Reactions  . Advil [Ibuprofen] Shortness Of Breath and Swelling  . Azo [Phenazopyridine] Other (See Comments)    Unknown reaction  . Indocin [Indomethacin] Other (See Comments)    Unknown reaction  . Loratadine Other (See Comments)    unknown  . Percocet [Oxycodone-Acetaminophen] Itching    Patient Measurements:   Heparin Dosing Weight: 52kg  Vital Signs: Temp: 99.6 F (37.6 C) (11/19 1123) Temp src: Rectal (11/19 1123) BP: 121/67 mmHg (11/19 1630) Pulse Rate: 118 (11/19 1630)  Labs:  Recent Labs  06/24/13 1220 06/24/13 1400  HGB 14.0  --   HCT 42.3  --   PLT 172  --   CREATININE 0.83  --   TROPONINI  --  <0.30    The CrCl is unknown because both a height and weight (above a minimum accepted value) are required for this calculation.   Medical History: Past Medical History  Diagnosis Date  . Sarcoidosis   . Hiatal hernia   . Diabetes mellitus without complication   . Osteoporosis     Medications:  Infusions:  . heparin    . heparin      Assessment: 27 yof presented to the ED with SOB. CT confirmed extensive PE. Baseline CBC is WNL. She is not on any anticoagulants PTA.   Goal of Therapy:  Heparin level 0.3-0.7 units/ml Monitor platelets by anticoagulation protocol: Yes   Plan:  1. Heparin bolus 3000 units IV x 1 2. Heparin gtt 800 units/hr 3. Check an 8 hour heparin level 4. Daily heparin level and CBC  Elizabeth Elizabeth, Elizabeth Elizabeth 06/24/2013,4:59 PM

## 2013-06-24 NOTE — ED Provider Notes (Signed)
CSN: [id number]     Arrival date & time 06/24/13  1058 History   First MD Initiated Contact with Patient 06/24/13 1140     Chief Complaint  Patient presents with  . Shortness of Breath   (Consider location/radiation/quality/duration/timing/severity/associated sxs/prior Treatment) Patient is a 72 y.o. female presenting with shortness of breath. The history is provided by the patient.  Shortness of Breath Associated symptoms: cough   Associated symptoms: no abdominal pain and no chest pain    patient's had shortness of breath over the last 2 days. She's had some coughing. He she states she's not been able to do the same activity due to shortness of breath. She has a mild cough without production. No fevers. Mild chest tightness. She has a history of sarcoidosis but states is well controlled. No dysuria. No abdominal pain. She states that she feels bad all over. She is diabetic.  Past Medical History  Diagnosis Date  . Sarcoidosis   . Hiatal hernia   . Diabetes mellitus without complication   . Osteoporosis    Past Surgical History  Procedure Laterality Date  . Abdominal hysterectomy    . Joint replacement      toe  . Lung lobectomy Left     Lower   Family History  Problem Relation Age of Onset  . Heart disease Mother   . Hypertension Mother   . Cancer Father     pancratic  . Cancer - Other Sister    History  Substance Use Topics  . Smoking status: Former Smoker    Types: Cigarettes    Quit date: 12/31/2000  . Smokeless tobacco: Not on file  . Alcohol Use: No   OB History   Grav Para Term Preterm Abortions TAB SAB Ect Mult Living                 Review of Systems  Constitutional: Positive for fatigue. Negative for appetite change.  Respiratory: Positive for cough and shortness of breath.   Cardiovascular: Negative for chest pain.  Gastrointestinal: Negative for abdominal pain.  Genitourinary: Negative for flank pain.  Musculoskeletal: Positive for back pain.   Neurological: Negative for dizziness and light-headedness.    Allergies  Advil; Azo; Indocin; Loratadine; and Percocet  Home Medications   Current Outpatient Rx  Name  Route  Sig  Dispense  Refill  . calcium-vitamin D (OSCAL 500/200 D-3) 500-200 MG-UNIT per tablet   Oral   Take 1 tablet by mouth daily with breakfast.         . darifenacin (ENABLEX) 15 MG 24 hr tablet   Oral   Take 15 mg by mouth daily.         . diphenhydrAMINE (BENADRYL) 25 MG tablet   Oral   Take 25 mg by mouth daily as needed for allergies.         Marland Kitchen glimepiride (AMARYL) 2 MG tablet   Oral   Take 1 tablet (2 mg total) by mouth daily.   30 tablet   1   . HYDROcodone-acetaminophen (NORCO) 10-325 MG per tablet   Oral   Take 1 tablet by mouth every 6 (six) hours as needed for severe pain.         . metFORMIN (GLUCOPHAGE) 500 MG tablet   Oral   Take 1 tablet (500 mg total) by mouth daily with breakfast.   30 tablet   3   . Multiple Vitamin (MULTIVITAMIN WITH MINERALS) TABS tablet   Oral   Take  1 tablet by mouth daily.         . mupirocin ointment (BACTROBAN) 2 %   Nasal   Place 1 application into the nose 2 (two) times daily. Apply to toes         . ondansetron (ZOFRAN) 8 MG tablet   Oral   Take 1 tablet (8 mg total) by mouth every 8 (eight) hours as needed for nausea.   20 tablet   1   . predniSONE (DELTASONE) 5 MG tablet   Oral   Take 10 mg by mouth daily with breakfast.         . trimethoprim (TRIMPEX) 100 MG tablet   Oral   Take 100 mg by mouth at bedtime.          BP 133/74  Pulse 123  Temp(Src) 99.6 F (37.6 C) (Rectal)  Resp 23  SpO2 100% Physical Exam  Nursing note and vitals reviewed. Constitutional: She is oriented to person, place, and time. She appears well-developed and well-nourished.  HENT:  Head: Normocephalic and atraumatic.  Eyes: EOM are normal. Pupils are equal, round, and reactive to light.  Neck: Normal range of motion. Neck supple.   Cardiovascular: Regular rhythm and normal heart sounds.   No murmur heard. Tachycardia  Pulmonary/Chest: No respiratory distress. She has wheezes. She has no rales.  Diffuse rhonchi  Abdominal: Soft. Bowel sounds are normal. She exhibits no distension. There is no tenderness. There is no rebound and no guarding.  Musculoskeletal: Normal range of motion. She exhibits edema.  Trace lower extremity edema.  Neurological: She is alert and oriented to person, place, and time. No cranial nerve deficit.  Skin: Skin is warm and dry.  Psychiatric: She has a normal mood and affect. Her speech is normal.    ED Course  Procedures (including critical care time) Labs Review Labs Reviewed  CBC WITH DIFFERENTIAL - Abnormal; Notable for the following:    WBC 18.6 (*)    MCV 100.7 (*)    Neutrophils Relative % 90 (*)    Lymphocytes Relative 8 (*)    Monocytes Relative 2 (*)    Neutro Abs 16.7 (*)    All other components within normal limits  PRO B NATRIURETIC PEPTIDE - Abnormal; Notable for the following:    Pro B Natriuretic peptide (BNP) 9584.0 (*)    All other components within normal limits  BASIC METABOLIC PANEL - Abnormal; Notable for the following:    CO2 14 (*)    Glucose, Bld 417 (*)    GFR calc non Af Amer 68 (*)    GFR calc Af Amer 79 (*)    All other components within normal limits  POCT I-STAT TROPONIN I - Abnormal; Notable for the following:    Troponin i, poc 0.20 (*)    All other components within normal limits  POCT I-STAT 3, BLOOD GAS (G3P V) - Abnormal; Notable for the following:    pH, Ven 7.379 (*)    pCO2, Ven 24.5 (*)    pO2, Ven 107.0 (*)    Bicarbonate 14.5 (*)    Acid-base deficit 9.0 (*)    All other components within normal limits  TROPONIN I  URINALYSIS, ROUTINE W REFLEX MICROSCOPIC   Imaging Review Dg Chest Port 1 View  06/24/2013   CLINICAL DATA:  Shortness of breath.  Low-grade fever.  EXAM: PORTABLE CHEST - 1 VIEW  COMPARISON:  05/02/2010 and  11/15/2007 chest radiographs  FINDINGS: The cardiac silhouette remains mildly  enlarged, unchanged. Calcification and likely surgical clips project over the aortic arch, unchanged. Left lung volume loss is unchanged with persistent left basilar opacity, which may reflect chronic atelectasis and elevation of the left hemidiaphragm although a small amount of pleural fluid is not excluded. The right lung remains clear without evidence of right-sided pleural effusion. There is no evidence of pneumothorax. Moderate dextroscoliosis centered in the lower thoracic spine is stable to slightly increased from 05/02/2010 and mildly increased from 2009. Sclerotic lesion is again partially visualized in the right humeral head, incompletely evaluated.  IMPRESSION: Unchanged appearance of left lung volume loss without evidence of acute airspace disease. Mild progression of thoracic scoliosis.   Electronically Signed   By: Sebastian Ache   On: 06/24/2013 12:20    EKG Interpretation    Date/Time:  Wednesday June 24 2013 11:10:20 EST Ventricular Rate:  125 PR Interval:  156 QRS Duration: 109 QT Interval:  337 QTC Calculation: 486 R Axis:   -127 Text Interpretation:  Sinus tachycardia Atrial premature complex Probable anterolateral infarct, acute baseline wander.  Left bundle branch block Confirmed by Charnetta Wulff  MD, Precious Segall ([id number]) on 06/24/2013 3:37:56 PM            MDM  No diagnosis found. Patient with shortness of breath. Tachycardia. X-ray is unchanged. Laboratory was hyperglycemia with low bicarbonate. She is not acidotic. BNP is elevated. CT angiography will be done to rule out pulmonary embolism. Will require admission.  a  Juliet Rude. Rubin Payor, MD 06/24/13 1540

## 2013-06-24 NOTE — ED Notes (Signed)
Family at bedside. 

## 2013-06-24 NOTE — ED Notes (Signed)
PT from home, c/o sob. Per pt, states sob started last night. Pt received one douneb and 3 2.5 albuterol and 125mg  solumedrol per ems

## 2013-06-24 NOTE — ED Notes (Signed)
NOTIFIED DR. PICKERING IN PERSON OF PATIENTS LABS OF I STAT TROPONIN 0.20 ng/ml, @12 :50PM, 06/24/2013.Marland Kitchen

## 2013-06-24 NOTE — ED Notes (Signed)
Critical Care at bedside.  

## 2013-06-24 NOTE — H&P (Signed)
[facility]                                                                                    Patient Demographics  Elizabeth Drake, is a 72 y.o. female  MRN: [id number]   DOB - [birth year]  Admit Date - 06/24/2013  Outpatient Primary MD for the patient is Rudi Heap, MD   With History of -  Past Medical History  Diagnosis Date  . Sarcoidosis   . Hiatal hernia   . Diabetes mellitus without complication   . Osteoporosis       Past Surgical History  Procedure Laterality Date  . Abdominal hysterectomy    . Joint replacement      toe  . Lung lobectomy Left     Lower    in for   Chief Complaint  Patient presents with  . Shortness of Breath     HPI  Elizabeth Drake  is a 72 y.o. 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, type 2 diabetes mellitus in poor control, osteoporosis, hiatal hernia, who was recently treated for left foot third toe cellulitis and infection which resolved and is now being troubled by right foot third toe infection for which she is taking an antibiotic in the outpatient setting, she was doing fairly well however since yesterday she suddenly developed shortness of breath along with some chest heaviness, her symptoms were worse with minimal exertion, she was then brought into the ER by family members with her workup was consistent with bilateral PE with CT evidence of a right ventricle strain. Pulmonary critical care was called who saw the patient in the ER requested that patient be started on IV heparin and be admitted by hospitalist service. Of note patient was mildly hypotensive upon admission blood pressures are better after IV fluids.   Currently patient's active complaints are shortness of breath, lower extremity weakness which is chronic right leg worse than left, right foot third toe infection and cellulitis which has been ongoing for the last few days, generalized weakness and  fatigue. She denies any recent travels, no personal or family history of blood clots in the past. No recent diagnosis of malignancy.    Review of Systems    In addition to the HPI above,   No Fever-chills, No Headache, No changes with Vision or hearing, No problems swallowing food or Liquids, No Chest pain, Cough , positive Shortness of Breath, No Abdominal pain, No Nausea or Vommitting, Bowel movements are regular, No Blood in stool or Urine, No dysuria, No new skin rashes or bruises, except right third toe infection as above No new joints pains-aches,  No new weakness, tingling, numbness in any extremity, No recent weight gain or loss, No polyuria, polydypsia or polyphagia, No significant Mental Stressors.  A full 10 point Review of Systems was done, except as stated above, all other Review of Systems were negative.   Social History History  Substance Use Topics  . Smoking status: Former Smoker    Types: Cigarettes    Quit date: 12/31/2000  . Smokeless tobacco: Not on file  . Alcohol Use: No  Family History Family History  Problem Relation Age of Onset  . Heart disease Mother   . Hypertension Mother   . Cancer Father     pancratic  . Cancer - Other Sister       Prior to Admission medications   Medication Sig Start Date End Date Taking? Authorizing Provider  calcium-vitamin D (OSCAL 500/200 D-3) 500-200 MG-UNIT per tablet Take 1 tablet by mouth daily with breakfast.   Yes Historical Provider, MD  darifenacin (ENABLEX) 15 MG 24 hr tablet Take 15 mg by mouth daily.   Yes Historical Provider, MD  diphenhydrAMINE (BENADRYL) 25 MG tablet Take 25 mg by mouth daily as needed for allergies.   Yes Historical Provider, MD  glimepiride (AMARYL) 2 MG tablet Take 1 tablet (2 mg total) by mouth daily. 05/26/13  Yes Deatra Canter, FNP  HYDROcodone-acetaminophen (NORCO) 10-325 MG per tablet Take 1 tablet by mouth every 6 (six) hours as needed for severe pain.   Yes  Historical Provider, MD  metFORMIN (GLUCOPHAGE) 500 MG tablet Take 1 tablet (500 mg total) by mouth daily with breakfast. 04/16/13  Yes Mary-Margaret Daphine Deutscher, FNP  Multiple Vitamin (MULTIVITAMIN WITH MINERALS) TABS tablet Take 1 tablet by mouth daily.   Yes Historical Provider, MD  mupirocin ointment (BACTROBAN) 2 % Place 1 application into the nose 2 (two) times daily. Apply to toes   Yes Historical Provider, MD  ondansetron (ZOFRAN) 8 MG tablet Take 1 tablet (8 mg total) by mouth every 8 (eight) hours as needed for nausea. 05/04/13  Yes Deatra Canter, FNP  predniSONE (DELTASONE) 5 MG tablet Take 10 mg by mouth daily with breakfast.   Yes Historical Provider, MD  trimethoprim (TRIMPEX) 100 MG tablet Take 100 mg by mouth at bedtime.   Yes Historical Provider, MD    Allergies  Allergen Reactions  . Advil [Ibuprofen] Shortness Of Breath and Swelling  . Azo [Phenazopyridine] Other (See Comments)    Unknown reaction  . Indocin [Indomethacin] Other (See Comments)    Unknown reaction  . Loratadine Other (See Comments)    unknown  . Percocet [Oxycodone-Acetaminophen] Itching    Physical Exam  Vitals  Blood pressure 121/67, pulse 118, temperature 99.6 F (37.6 C), temperature source Rectal, resp. rate 25, SpO2 95.00%.   1. General frail elderly white female lying in bed in mild to moderate shortness of breath  2. Normal affect and insight, Not Suicidal or Homicidal, Awake Alert, Oriented X 3.  3. No F.N deficits, ALL C.Nerves Intact, Strength 5/5 all 4 extremities, Sensation intact all 4 extremities, Plantars down going.  4. Ears and Eyes appear Normal, Conjunctivae clear, PERRLA. Moist Oral Mucosa.  5. Supple Neck, No JVD, No cervical lymphadenopathy appriciated, No Carotid Bruits.  6. Symmetrical Chest wall movement, Good air movement bilaterally, CTAB.  7. RRR, No Gallops, Rubs or Murmurs, No Parasternal Heave.  8. Positive Bowel Sounds, Abdomen Soft, Non tender, No organomegaly  appriciated,No rebound -guarding or rigidity.  9.  No Cyanosis, Normal Skin Turgor, No Skin Rash or Bruise, right third toe has cellulitis and a small ulcer on the tip  10. Good muscle tone,  joints appear normal , no effusions, Normal ROM.  11. No Palpable Lymph Nodes in Neck or Axillae     Data Review  CBC  Recent Labs Lab 06/24/13 1220  WBC 18.6*  HGB 14.0  HCT 42.3  PLT 172  MCV 100.7*  MCH 33.3  MCHC 33.1  RDW 12.4  LYMPHSABS 1.5  MONOABS 0.4  EOSABS 0.0  BASOSABS 0.0   ------------------------------------------------------------------------------------------------------------------  Chemistries   Recent Labs Lab 06/24/13 1220  NA 140  K 3.8  CL 100  CO2 14*  GLUCOSE 417*  BUN 22  CREATININE 0.83  CALCIUM 9.7   ------------------------------------------------------------------------------------------------------------------ CrCl is unknown because both a height and weight (above a minimum accepted value) are required for this calculation. ------------------------------------------------------------------------------------------------------------------ No results found for this basename: TSH, T4TOTAL, FREET3, T3FREE, THYROIDAB,  in the last 72 hours   Coagulation profile No results found for this basename: INR, PROTIME,  in the last 168 hours ------------------------------------------------------------------------------------------------------------------- No results found for this basename: DDIMER,  in the last 72 hours -------------------------------------------------------------------------------------------------------------------  Cardiac Enzymes  Recent Labs Lab 06/24/13 1400  TROPONINI <0.30   ------------------------------------------------------------------------------------------------------------------ No components found with this basename: POCBNP,     ---------------------------------------------------------------------------------------------------------------  Urinalysis    Component Value Date/Time   COLORURINE YELLOW 06/24/2013 1459   APPEARANCEUR CLOUDY* 06/24/2013 1459   LABSPEC 1.031* 06/24/2013 1459   PHURINE 5.0 06/24/2013 1459   GLUCOSEU >1000* 06/24/2013 1459   HGBUR MODERATE* 06/24/2013 1459   BILIRUBINUR NEGATIVE 06/24/2013 1459   KETONESUR 40* 06/24/2013 1459   PROTEINUR NEGATIVE 06/24/2013 1459   UROBILINOGEN 0.2 06/24/2013 1459   NITRITE POSITIVE* 06/24/2013 1459   LEUKOCYTESUR NEGATIVE 06/24/2013 1459    ----------------------------------------------------------------------------------------------------------------  Imaging results:   Ct Angio Chest W/cm &/or Wo Cm  06/24/2013   CLINICAL DATA:  Shortness of Breath  EXAM: CT ANGIOGRAPHY CHEST WITH CONTRAST  TECHNIQUE: Multidetector CT imaging of the chest was performed using the standard protocol during bolus administration of intravenous contrast. Multiplanar CT image reconstructions including MIPs were obtained to evaluate the vascular anatomy.  CONTRAST:  80mL OMNIPAQUE IOHEXOL 350 MG/ML SOLN  COMPARISON:  Chest radiograph June 24, 2013  FINDINGS: There is extensive pulmonary embolus arising from both main pulmonary arteries and extending into both lower lobe pulmonary artery systems, somewhat more on the right than on the left. There are also pulmonary emboli extending into the proximal right upper lobe pulmonary arteries.  There is enlargement of the right ventricle compared to the left ventricle with a right ventricle to left ventricular wall ratio of 1.7, a finding that is felt to be indicative of right ventricular strain.  There is atherosclerotic change in the aorta. There is no thoracic aortic aneurysm or apparent dissection.  There is patchy atelectatic change in the right middle and lower lobes. There is no frank edema or consolidation. No appreciable  effusions.  There is no appreciable thoracic adenopathy. There is no appreciable pericardial effusion.  Visualized upper abdominal structures appear unremarkable except for atherosclerotic change in the aorta. There is degenerative change in the thoracic spine. There are no blastic or lytic bone lesions. There is thoracic dextroscoliosis. Thyroid appears normal.  Review of the MIP images confirms the above findings.  IMPRESSION: Extensive pulmonary embolus with evidence of right ventricular strain as described above. Note that the pulmonary emboli involve significant portions of both right and left main pulmonary arteries. No edema or consolidation.  Given the right ventricular strain and volume of pulmonary embolus, pulmonary/intensivist consultation as well as potential interventional radiology consultation may be reasonable.  Critical Value/emergent results were called by telephone at the time of interpretation on 06/24/2013 at 4:37 PM to Dr.Kohut, who verbally acknowledged these results.   Electronically Signed   By: Bretta Bang M.D.   On: 06/24/2013 16:39   Dg Chest Port 1 View  06/24/2013   CLINICAL DATA:  Shortness of breath.  Low-grade fever.  EXAM: PORTABLE CHEST - 1 VIEW  COMPARISON:  05/02/2010 and 11/15/2007 chest radiographs  FINDINGS: The cardiac silhouette remains mildly enlarged, unchanged. Calcification and likely surgical clips project over the aortic arch, unchanged. Left lung volume loss is unchanged with persistent left basilar opacity, which may reflect chronic atelectasis and elevation of the left hemidiaphragm although a small amount of pleural fluid is not excluded. The right lung remains clear without evidence of right-sided pleural effusion. There is no evidence of pneumothorax. Moderate dextroscoliosis centered in the lower thoracic spine is stable to slightly increased from 05/02/2010 and mildly increased from 2009. Sclerotic lesion is again partially visualized in the right  humeral head, incompletely evaluated.  IMPRESSION: Unchanged appearance of left lung volume loss without evidence of acute airspace disease. Mild progression of thoracic scoliosis.   Electronically Signed   By: Sebastian Ache   On: 06/24/2013 12:20    My personal review of EKG: Rhythm S.Tachy, Rate  115 /min,  Non specific ST changes    Assessment & Plan    1. Bilateral extensive PE with hypotension and some evidence of right ventricle strain on CT scan. Patient will be admitted to step down unit, IV fluid bolus followed by maintenance, hypercoagulable panel has been ordered in the ER, IV heparin for now, once stable can be transitioned to oral xaralto. Will order lower extremity venous duplex, one time outpatient followup with hematology post discharge is recommended.     2. 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     3. Diabetes mellitus type 2 in poor control. Sugars are more than 400 and ER, she will be placed on stress dose steroids as above, will check A1c, oral Glucotrol will be continued, will hold Glucophage and place her on IV glucose drip per protocol.    4. Cellulitis of the right third toe. Doxycycline IV, wound care.     5. History of sarcoidosis, peripheral neuropathy due to sarcoidosis causing right more than left leg weakness - no acute issues, she is chronically wheelchair-bound, supportive care, PT OT once stable. No she is on chronic steroids, for now IV steroids as she is under stress and hypotensive.     6. Chronic pain. Oral and IV narcotics as needed.       DVT Prophylaxis Heparin    AM Labs Ordered, also please review Full Orders  Family Communication: Admission, patients condition and plan of care including tests being ordered have been discussed with the patient and family who indicate understanding and agree with the plan and  Code Status.  Code Status DNR  Likely DC to  TBD  Condition GUARDED ++  Time spent in minutes : 50    SINGH,PRASHANT K M.D on 06/24/2013 at 5:32 PM  Between 7am to 7pm - Pager - [phone]  After 7pm go to [email or web address] - password [email or web address]  And look for the night coverage person covering me after hours  [facility] Office  [phone]

## 2013-06-24 NOTE — ED Notes (Signed)
Dr Pickering at bedside 

## 2013-06-25 DIAGNOSIS — E1310 Other specified diabetes mellitus with ketoacidosis without coma: Secondary | ICD-10-CM

## 2013-06-25 DIAGNOSIS — I2699 Other pulmonary embolism without acute cor pulmonale: Secondary | ICD-10-CM

## 2013-06-25 DIAGNOSIS — E1110 Type 2 diabetes mellitus with ketoacidosis without coma: Secondary | ICD-10-CM | POA: Diagnosis present

## 2013-06-25 DIAGNOSIS — I369 Nonrheumatic tricuspid valve disorder, unspecified: Secondary | ICD-10-CM

## 2013-06-25 DIAGNOSIS — E119 Type 2 diabetes mellitus without complications: Secondary | ICD-10-CM

## 2013-06-25 LAB — HEPARIN LEVEL (UNFRACTIONATED)
Heparin Unfractionated: 0.38 IU/mL (ref 0.30–0.70)
Heparin Unfractionated: <0.1 IU/mL — ABNORMAL LOW (ref 0.30–0.70)

## 2013-06-25 LAB — CARDIOLIPIN ANTIBODIES, IGG, IGM, IGA
Anticardiolipin IgA: 3 APL U/mL — ABNORMAL LOW (ref <22)
Anticardiolipin IgG: 3 GPL U/mL — ABNORMAL LOW (ref <23)
Anticardiolipin IgM: 2 MPL U/mL — ABNORMAL LOW (ref <11)

## 2013-06-25 LAB — CBC
HCT: 39.9 % (ref 36.0–46.0)
HCT: 37.5 % (ref 36.0–46.0)
MCH: 33.3 pg (ref 26.0–34.0)
MCH: 33.2 pg (ref 26.0–34.0)
MCHC: 34.1 g/dL (ref 30.0–36.0)
MCHC: 34.4 g/dL (ref 30.0–36.0)
MCV: 96.6 fL (ref 78.0–100.0)
RBC: 3.88 MIL/uL (ref 3.87–5.11)
RDW: 12.4 % (ref 11.5–15.5)
RDW: 12.3 % (ref 11.5–15.5)

## 2013-06-25 LAB — BASIC METABOLIC PANEL
BUN: 23 mg/dL (ref 6–23)
BUN: 23 mg/dL (ref 6–23)
BUN: 22 mg/dL (ref 6–23)
CO2: 16 mEq/L — ABNORMAL LOW (ref 19–32)
CO2: 13 mEq/L — ABNORMAL LOW (ref 19–32)
CO2: 17 mEq/L — ABNORMAL LOW (ref 19–32)
Calcium: 9.6 mg/dL (ref 8.4–10.5)
Calcium: 9.6 mg/dL (ref 8.4–10.5)
Calcium: 9.3 mg/dL (ref 8.4–10.5)
Chloride: 103 mEq/L (ref 96–112)
Chloride: 107 mEq/L (ref 96–112)
Chloride: 111 mEq/L (ref 96–112)
Creatinine, Ser: 0.78 mg/dL (ref 0.50–1.10)
Creatinine, Ser: 0.72 mg/dL (ref 0.50–1.10)
Creatinine, Ser: 0.77 mg/dL (ref 0.50–1.10)
Creatinine, Ser: 0.64 mg/dL (ref 0.50–1.10)
Creatinine, Ser: 0.55 mg/dL (ref 0.50–1.10)
GFR calc Af Amer: >90 mL/min (ref >90)
GFR calc Af Amer: >90 mL/min (ref >90)
GFR calc non Af Amer: 83 mL/min — ABNORMAL LOW (ref >90)
GFR calc non Af Amer: >90 mL/min (ref >90)
Glucose, Bld: 382 mg/dL — ABNORMAL HIGH (ref 70–99)
Glucose, Bld: 411 mg/dL — ABNORMAL HIGH (ref 70–99)
Glucose, Bld: 311 mg/dL — ABNORMAL HIGH (ref 70–99)
Potassium: 4.6 mEq/L (ref 3.5–5.1)
Potassium: 4.6 mEq/L (ref 3.5–5.1)
Potassium: 4 mEq/L (ref 3.5–5.1)
Potassium: 3.6 mEq/L (ref 3.5–5.1)
Sodium: 143 mEq/L (ref 135–145)

## 2013-06-25 LAB — HEMOGLOBIN A1C: Mean Plasma Glucose: 166 mg/dL — ABNORMAL HIGH (ref <117)

## 2013-06-25 LAB — GLUCOSE, CAPILLARY
Glucose-Capillary: 109 mg/dL — ABNORMAL HIGH (ref 70–99)
Glucose-Capillary: 133 mg/dL — ABNORMAL HIGH (ref 70–99)
Glucose-Capillary: 135 mg/dL — ABNORMAL HIGH (ref 70–99)
Glucose-Capillary: 141 mg/dL — ABNORMAL HIGH (ref 70–99)
Glucose-Capillary: 145 mg/dL — ABNORMAL HIGH (ref 70–99)
Glucose-Capillary: 160 mg/dL — ABNORMAL HIGH (ref 70–99)
Glucose-Capillary: 164 mg/dL — ABNORMAL HIGH (ref 70–99)
Glucose-Capillary: 179 mg/dL — ABNORMAL HIGH (ref 70–99)
Glucose-Capillary: 180 mg/dL — ABNORMAL HIGH (ref 70–99)
Glucose-Capillary: 221 mg/dL — ABNORMAL HIGH (ref 70–99)
Glucose-Capillary: 293 mg/dL — ABNORMAL HIGH (ref 70–99)
Glucose-Capillary: 321 mg/dL — ABNORMAL HIGH (ref 70–99)
Glucose-Capillary: 364 mg/dL — ABNORMAL HIGH (ref 70–99)
Glucose-Capillary: 374 mg/dL — ABNORMAL HIGH (ref 70–99)

## 2013-06-25 LAB — PROTEIN S ACTIVITY: Protein S Activity: 86 % (ref 69–129)

## 2013-06-25 LAB — LUPUS ANTICOAGULANT PANEL
DRVVT: 33.8 secs (ref <42.9)
PTT Lupus Anticoagulant: 28.7 secs (ref 28.0–43.0)

## 2013-06-25 LAB — FACTOR 5 LEIDEN

## 2013-06-25 LAB — BETA-2-GLYCOPROTEIN I ABS, IGG/M/A
Beta-2 Glyco I IgG: 2 G Units (ref <20)
Beta-2-Glycoprotein I IgM: 8 M Units (ref <20)

## 2013-06-25 LAB — PROTEIN C ACTIVITY: Protein C Activity: 108 % (ref 75–133)

## 2013-06-25 LAB — PROTHROMBIN GENE MUTATION

## 2013-06-25 MED ORDER — HYDROCORTISONE SOD SUCCINATE 100 MG IJ SOLR
50.0000 mg | Freq: Two times a day (BID) | INTRAMUSCULAR | Status: DC
  Administered 2013-06-25 – 2013-06-26 (×2): 50 mg via INTRAVENOUS
  Filled 2013-06-25 (×4): qty 1

## 2013-06-25 MED ORDER — SODIUM CHLORIDE 0.9 % IV BOLUS (SEPSIS)
500.0000 mL | Freq: Once | INTRAVENOUS | Status: AC
  Administered 2013-06-25: 500 mL via INTRAVENOUS

## 2013-06-25 MED ORDER — SODIUM CHLORIDE 0.9 % IV SOLN
INTRAVENOUS | Status: DC
  Administered 2013-06-25: 02:00:00 via INTRAVENOUS

## 2013-06-25 MED ORDER — DEXTROSE 50 % IV SOLN: 25.0000 mL | INTRAVENOUS | Status: DC | PRN

## 2013-06-25 MED ORDER — INSULIN ASPART 100 UNIT/ML ~~LOC~~ SOLN
0.0000 [IU] | Freq: Three times a day (TID) | SUBCUTANEOUS | Status: DC
  Administered 2013-06-26 (×2): 2 [IU] via SUBCUTANEOUS
  Administered 2013-06-27: 1 [IU] via SUBCUTANEOUS
  Administered 2013-06-27 – 2013-06-28 (×2): 3 [IU] via SUBCUTANEOUS
  Administered 2013-06-28: 1 [IU] via SUBCUTANEOUS
  Administered 2013-06-28 – 2013-06-29 (×2): 2 [IU] via SUBCUTANEOUS
  Administered 2013-06-29: 1 [IU] via SUBCUTANEOUS
  Administered 2013-06-30: 3 [IU] via SUBCUTANEOUS
  Administered 2013-06-30: 2 [IU] via SUBCUTANEOUS
  Administered 2013-06-30: 1 [IU] via SUBCUTANEOUS

## 2013-06-25 MED ORDER — INSULIN GLARGINE 100 UNIT/ML ~~LOC~~ SOLN
8.0000 [IU] | Freq: Once | SUBCUTANEOUS | Status: AC
  Administered 2013-06-25: 8 [IU] via SUBCUTANEOUS
  Filled 2013-06-25: qty 0.08

## 2013-06-25 MED ORDER — SODIUM CHLORIDE 0.9 % IJ SOLN
10.0000 mL | Freq: Two times a day (BID) | INTRAMUSCULAR | Status: DC
  Administered 2013-06-25: 10 mL
  Administered 2013-06-25: 40 mL
  Administered 2013-06-26: 10 mL
  Administered 2013-06-26: 13 mL
  Administered 2013-06-27: 20 mL
  Administered 2013-06-27: 10 mL
  Administered 2013-06-28 – 2013-06-29 (×2): 12 mL

## 2013-06-25 MED ORDER — SODIUM CHLORIDE 0.9 % IV SOLN
INTRAVENOUS | Status: DC
  Administered 2013-06-25: 0.8 [IU]/h via INTRAVENOUS
  Administered 2013-06-25: 1 [IU]/h via INTRAVENOUS
  Administered 2013-06-25: 1.2 [IU]/h via INTRAVENOUS
  Administered 2013-06-25: 1 [IU]/h via INTRAVENOUS
  Administered 2013-06-25: 02:00:00 via INTRAVENOUS
  Administered 2013-06-25: 1.5 [IU]/h via INTRAVENOUS
  Filled 2013-06-25 (×2): qty 1

## 2013-06-25 MED ORDER — RIVAROXABAN 15 MG PO TABS
15.0000 mg | ORAL_TABLET | Freq: Two times a day (BID) | ORAL | Status: DC
  Administered 2013-06-25 – 2013-06-30 (×11): 15 mg via ORAL
  Filled 2013-06-25 (×13): qty 1

## 2013-06-25 MED ORDER — SODIUM CHLORIDE 0.9 % IJ SOLN
10.0000 mL | INTRAMUSCULAR | Status: DC | PRN
  Administered 2013-06-29: 20 mL
  Administered 2013-06-29 – 2013-06-30 (×3): 10 mL

## 2013-06-25 MED ORDER — RIVAROXABAN 20 MG PO TABS: 20.0000 mg | ORAL_TABLET | Freq: Every day | ORAL | Status: DC

## 2013-06-25 MED ORDER — INSULIN ASPART 100 UNIT/ML ~~LOC~~ SOLN: 0.0000 [IU] | Freq: Every day | SUBCUTANEOUS | Status: DC

## 2013-06-25 MED ORDER — SODIUM CHLORIDE 0.45 % IV SOLN
INTRAVENOUS | Status: DC
  Administered 2013-06-25 – 2013-06-26 (×2): via INTRAVENOUS
  Administered 2013-06-26: 125 mL/h via INTRAVENOUS
  Administered 2013-06-27: 1000 mL via INTRAVENOUS

## 2013-06-25 MED ORDER — DEXTROSE-NACL 5-0.45 % IV SOLN
INTRAVENOUS | Status: DC
  Administered 2013-06-25: 05:00:00 via INTRAVENOUS

## 2013-06-25 NOTE — Progress Notes (Signed)
  Echocardiogram 2D Echocardiogram has been performed.  Elizabeth Drake FRANCES 06/25/2013, 5:19 PM

## 2013-06-25 NOTE — Progress Notes (Addendum)
ANTICOAGULATION CONSULT NOTE  Pharmacy Consult for xarelto Indication: pulmonary embolus  Allergies  Allergen Reactions  . Advil [Ibuprofen] Shortness Of Breath and Swelling  . Azo [Phenazopyridine] Other (See Comments)    Unknown reaction  . Indocin [Indomethacin] Other (See Comments)    Unknown reaction  . Loratadine Other (See Comments)    unknown  . Percocet [Oxycodone-Acetaminophen] Itching    Patient Measurements:   Heparin Dosing Weight: 52kg  Vital Signs: Temp: 98 F (36.7 C) (11/20 0803) Temp src: Oral (11/20 0803) BP: 103/52 mmHg (11/20 1149) Pulse Rate: 117 (11/20 1149)  Labs:  Recent Labs  06/24/13 1220 06/24/13 1400 06/24/13 2320 06/25/13 0140 06/25/13 0355 06/25/13 1250  HGB 14.0  --   --  13.6  --  12.9  HCT 42.3  --   --  39.9  --  37.5  PLT 172  --   --  142*  --  122*  HEPARINUNFRC  --   --   --  0.38  --   --   CREATININE 0.83  --  0.78 0.72 0.77  --   TROPONINI  --  <0.30  --   --   --   --     The CrCl is unknown because both a height and weight (above a minimum accepted value) are required for this calculation.  Assessment: 72 yo female with PE. MD has now change to xarelto for PE since pt lost access to IV heparin.   Goal of Therapy:   Watch for signs of bleeding   Plan:  Xarelto 15mg  PO BID x 21 days then 20mg  PO qday Dc heparin   Ulyses Southward, PharmD Pager: [phone] 06/25/2013 2:55 PM

## 2013-06-25 NOTE — Progress Notes (Signed)
ANTICOAGULATION CONSULT NOTE  Pharmacy Consult for heparin Indication: pulmonary embolus  Allergies  Allergen Reactions  . Advil [Ibuprofen] Shortness Of Breath and Swelling  . Azo [Phenazopyridine] Other (See Comments)    Unknown reaction  . Indocin [Indomethacin] Other (See Comments)    Unknown reaction  . Loratadine Other (See Comments)    unknown  . Percocet [Oxycodone-Acetaminophen] Itching    Patient Measurements:   Heparin Dosing Weight: 52kg  Vital Signs: Temp: 97.5 F (36.4 C) (11/20 0000) Temp src: Oral (11/20 0000) BP: 127/64 mmHg (11/20 0000) Pulse Rate: 92 (11/20 0000)  Labs:  Recent Labs  06/24/13 1220 06/24/13 1400 06/24/13 2320 06/25/13 0140  HGB 14.0  --   --  13.6  HCT 42.3  --   --  39.9  PLT 172  --   --  142*  HEPARINUNFRC  --   --   --  0.38  CREATININE 0.83  --  0.78 0.72  TROPONINI  --  <0.30  --   --     The CrCl is unknown because both a height and weight (above a minimum accepted value) are required for this calculation.  Assessment: 72 yo female with PE for heparin Goal of Therapy:  Heparin level 0.3-0.7 units/ml Monitor platelets by anticoagulation protocol: Yes   Plan:  Continue Heparin at current rate  Recheck level with am labs  Elizabeth Drake, Gary Fleet 06/25/2013,2:37 AM

## 2013-06-25 NOTE — Progress Notes (Signed)
Consider discontinuing Amaryl 2 mg daily while on insulin drip and  until CBGs are better controlled. Will continue to follow while in hospital.   Smith Mince RN BSN CDE

## 2013-06-25 NOTE — Progress Notes (Signed)
Utilization review completed.  

## 2013-06-25 NOTE — Progress Notes (Signed)
Peripherally Inserted Central Catheter/Midline Placement  The IV Nurse has discussed with the patient and/or persons authorized to consent for the patient, the purpose of this procedure and the potential benefits and risks involved with this procedure.  The benefits include less needle sticks, lab draws from the catheter and patient may be discharged home with the catheter.  Risks include, but not limited to, infection, bleeding, blood clot (thrombus formation), and puncture of an artery; nerve damage and irregular heat beat.  Alternatives to this procedure were also discussed.  PICC/Midline Placement Documentation        Elizabeth Drake 06/25/2013, 2:14 PM

## 2013-06-25 NOTE — Progress Notes (Addendum)
PULMONARY  / CRITICAL CARE MEDICINE  Name: Elizabeth Drake MRN: [id number] DOB: 1941/07/16    ADMISSION DATE:  06/24/2013 CONSULTATION DATE:  06/24/13  REFERRING MD :  EDP PRIMARY SERVICE: PCCM  CHIEF COMPLAINT:  PE and RV strain  BRIEF PATIENT DESCRIPTION: 72 y.o. Wheelchair bound female with with PMH of chronic low back pain, sarcoidosis, and DM presents to [facility] 11/19 via EMS with shortness of breath x2  days.    SIGNIFICANT EVENTS / STUDIES:  11/19 admitted 11/19 CT angio chest >> Multiple PEs arising from both arteries extending in bilateral lower lobes.  R>L, Mild RV strain  LINES / TUBES:   CULTURES: None  ANTIBIOTICS: None indicated at this time  SUBJECTIVE:  Feels a little better today  VITAL SIGNS: Temp:  [97.1 F (36.2 C)-98 F (36.7 C)] 98 F (36.7 C) (11/20 0803) Pulse Rate:  [69-131] 117 (11/20 1149) Resp:  [19-37] 27 (11/20 1149) BP: (90-163)/(52-90) 103/52 mmHg (11/20 1149) SpO2:  [90 %-100 %] 99 % (11/20 1149) HEMODYNAMICS:   VENTILATOR SETTINGS:   INTAKE / OUTPUT: Intake/Output     11/19 0701 - 11/20 0700 11/20 0701 - 11/21 0700   P.O. 360    I.V. 446    Total Intake 806     Net +806          Urine Occurrence 2 x      PHYSICAL EXAMINATION: General:  Chronically ill appearing, no distress HEENT: NCAT, EOMi CV: Tachy, regular PULM: CTA B AB: BS+, soft, nontender Ext: warm, acynotic  LABS:  Recent Labs Lab 06/24/13 1220 06/24/13 1400 06/24/13 2320 06/25/13 0140 06/25/13 0355  HGB 14.0  --   --  13.6  --   WBC 18.6*  --   --  13.0*  --   PLT 172  --   --  142*  --   NA 140  --  137 136 141  K 3.8  --  4.6 4.6 4.0  CL 100  --  102 103 107  CO2 14*  --  16* 13* 17*  GLUCOSE 417*  --  382* 411* 311*  BUN 22  --  21 23 23   CREATININE 0.83  --  0.78 0.72 0.77  CALCIUM 9.7  --  9.6 9.4 9.6  TROPONINI  --  <0.30  --   --   --   PROBNP 9584.0*  --   --   --   --     Recent Labs Lab 06/25/13 0630 06/25/13 0742 06/25/13 0934  06/25/13 1044 06/25/13 1152  GLUCAP 180* 179* 125* 101* 133*    CXR: No acute infiltrates, cardiomegaly, old LLL resection noted  ASSESSMENT / PLAN:  PULMONARY A: Shortness of breath secondary to PE Sarcoidosis P:   - Supplemental O2 - goal sats > 92% - heparin drip per PE protocol  CARDIOVASCULAR A:  PE with RV strain HTN Tachycardia Chronic prednisone use P:  - TTE - EKG - Troponins - Not a candidate for thrombolytics as hemodynamically stable  HEMATOLOGIC A:   First lifetime PE in wheelchair bound patient, feel that immobility is the risk here Hypercoagulability panel likely not going to change management P:  - Heparin gtt> Xarelto - treat 6 months  Goals of care:  Patient full DNR status PCCM to sign off, call if questions  Yolonda Kida PCCM Pager: [phone] Cell: [phone] If no response, call [phone]

## 2013-06-25 NOTE — Progress Notes (Signed)
ANTICOAGULATION CONSULT NOTE  Pharmacy Consult for heparin Indication: pulmonary embolus  Allergies  Allergen Reactions  . Advil [Ibuprofen] Shortness Of Breath and Swelling  . Azo [Phenazopyridine] Other (See Comments)    Unknown reaction  . Indocin [Indomethacin] Other (See Comments)    Unknown reaction  . Loratadine Other (See Comments)    unknown  . Percocet [Oxycodone-Acetaminophen] Itching    Patient Measurements:   Heparin Dosing Weight: 52kg  Vital Signs: Temp: 98 F (36.7 C) (11/20 0803) Temp src: Oral (11/20 0803) BP: 103/52 mmHg (11/20 1149) Pulse Rate: 117 (11/20 1149)  Labs:  Recent Labs  06/24/13 1220 06/24/13 1400 06/24/13 2320 06/25/13 0140 06/25/13 0355  HGB 14.0  --   --  13.6  --   HCT 42.3  --   --  39.9  --   PLT 172  --   --  142*  --   HEPARINUNFRC  --   --   --  0.38  --   CREATININE 0.83  --  0.78 0.72 0.77  TROPONINI  --  <0.30  --   --   --     The CrCl is unknown because both a height and weight (above a minimum accepted value) are required for this calculation.  Assessment: 72 yo female with PE for heparin. Pt has lost IV access for heparin. Has been off for approximately 2 hours now.  Goal of Therapy:  Heparin level 0.3-0.7 units/ml Monitor platelets by anticoagulation protocol: Yes   Plan:  Will check with MD to see if we can possibly use lovenox

## 2013-06-25 NOTE — Progress Notes (Addendum)
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 She presented to the ER for dyspnea and chest pressure and was found to have b/l Pulmonary embolism and an occlusive DVT in the left popliteal vein.   Assessment/Plan: Principal Problem:   Pulmonary embolism/ DVT - right heart strain on CT chest- did not need TPA - BP significantly improved today with hydration - will switch from heparin to Xarelto today -pulm agrees with plan and will sign off now  Active Problems:     DM type 2 (diabetes mellitus, type 2)/ DKA - cont insulin infusion until gap closes - no Bmet since 3 am despite order to draw every 2 hrs- had trouble obtaining blood this AM and therefore ordered a foot stick at 11 AM - have just spoken with the lab and they tell me that they do not have an order bu twill run the b met now - A1c is about 8   Sarcoidosis - on chronic prednisone at home - on stress dose steroids- will taper today    Paraparesis/ Wheelchair bound/ chronic pain in back and legs - was able to transfer to chair at home - PRN Vicodin  UTI - follow culture - currently on Doxy - on Trimethoprim at home    Cellulitis of toe- right - wound is healed- no significant tenderness or redness today   Code Status: DNR Family Communication: with husband Disposition Plan: follow in SDU  Consultants: Pulm  Procedures: none  Antibiotics: Doxycycline 11/19>>  DVT prophylaxis: Heparin infusion/ Xarelto  HPI/Subjective: Pt quite weak but no dyspnea at rest- mild pain in back and legs- no significant chest pain currently   Objective: Blood pressure 103/52,  pulse 117, temperature 97.9 F (36.6 C), temperature source Oral, resp. rate 27, height 5\' 6"  (1.676 m), weight 53.978 kg (119 lb), SpO2 99.00%.  Intake/Output Summary (Last 24 hours) at 06/25/13 1649 Last data filed at 06/25/13 0600  Gross per 24 hour  Intake    806 ml  Output      0 ml  Net    806 ml     Exam: General: No acute respiratory distress Lungs: Clear to auscultation bilaterally without wheezes or crackles Cardiovascular: Regular rate and rhythm without murmur gallop or rub normal S1 and S2- HR 100-115 Abdomen: Nontender, nondistended, soft, bowel sounds positive, no rebound, no ascites, no appreciable mass Extremities: No significant cyanosis, clubbing, or edema bilateral lower extremities- good pedal pulses Skin- small ulcer on right 4th toe at the tip- no discharge, tenderness or erythema   Data Reviewed: Basic Metabolic Panel:  Recent Labs Lab 06/24/13 1220 06/24/13 2320 06/25/13 0140 06/25/13 0355  NA 140 137 136 141  K 3.8 4.6 4.6 4.0  CL 100 102 103 107  CO2 14* 16* 13* 17*  GLUCOSE 417* 382* 411* 311*  BUN 22 21 23 23   CREATININE 0.83 0.78 0.72 0.77  CALCIUM 9.7 9.6 9.4 9.6   Liver Function Tests: No results found for this basename: AST, ALT, ALKPHOS, BILITOT, PROT, ALBUMIN,  in the last 168 hours No results found for this basename: LIPASE, AMYLASE,  in the last 168 hours No results found for this basename: AMMONIA,  in  the last 168 hours CBC:  Recent Labs Lab 06/24/13 1220 06/25/13 0140 06/25/13 1250  WBC 18.6* 13.0* 20.9*  NEUTROABS 16.7*  --   --   HGB 14.0 13.6 12.9  HCT 42.3 39.9 37.5  MCV 100.7* 97.8 96.6  PLT 172 142* 122*   Cardiac Enzymes:  Recent Labs Lab 06/24/13 1400  TROPONINI <0.30   BNP (last 3 results)  Recent Labs  06/24/13 1220  PROBNP 9584.0*   CBG:  Recent Labs Lab 06/25/13 0934 06/25/13 1044 06/25/13 1152 06/25/13 1303 06/25/13 1418  GLUCAP 125* 101* 133* 135* 131*    Recent Results (from the  past 240 hour(s))  URINE CULTURE     Status: None   Collection Time    06/24/13  2:59 PM      Result Value Range Status   Specimen Description URINE, CATHETERIZED   Final   Special Requests NONE   Final   Culture  Setup Time     Final   Value: 06/24/2013 16:17     Performed at [facility] Count     Final   Value: >=100,000 COLONIES/ML     Performed at [facility]   Culture     Final   Value: ESCHERICHIA COLI     Performed at [facility]   Report Status PENDING   Incomplete  MRSA PCR SCREENING     Status: None   Collection Time    06/25/13 12:42 AM      Result Value Range Status   MRSA by PCR NEGATIVE  NEGATIVE Final   Comment:            The GeneXpert MRSA Assay (FDA     approved for NASAL specimens     only), is one component of a     comprehensive MRSA colonization     surveillance program. It is not     intended to diagnose MRSA     infection nor to guide or     monitor treatment for     MRSA infections.     Studies:  Recent x-ray studies have been reviewed in detail by the Attending Physician  Scheduled Meds:  Scheduled Meds: . calcium-vitamin D  1 tablet Oral Q breakfast  . darifenacin  15 mg Oral Daily  . doxycycline (VIBRAMYCIN) IV  100 mg Intravenous Q12H  . glimepiride  2 mg Oral Daily  . hydrocortisone sod succinate (SOLU-CORTEF) inj  50 mg Intravenous Q12H  . insulin regular  0-10 Units Intravenous TID WC  . multivitamin with minerals  1 tablet Oral Daily  . mupirocin ointment  1 application Nasal BID  . Rivaroxaban  15 mg Oral BID WC   Followed by  . [START ON 07/16/2013] rivaroxaban  20 mg Oral Q supper  . sodium chloride  10-40 mL Intracatheter Q12H  . sodium chloride  3 mL Intravenous Q12H   Continuous Infusions: . dextrose 5 % and 0.45% NaCl 100 mL/hr at 06/25/13 1500  . insulin (NOVOLIN-R) infusion 1 Units/hr (06/25/13 1648)    Time spent on care of this patient: 45 min   Cristan Hout, MD  [facility] Office  [phone] Pager - Text Page per Amion as per below:  On-Call/Text Page:      [email or web address]      password [email or web address]  If 7PM-7AM, please contact night-coverage [email or web address] Password [email or web address] 06/25/2013, 4:49 PM   LOS: 1 day

## 2013-06-25 NOTE — Progress Notes (Signed)
VASCULAR LAB PRELIMINARY  PRELIMINARY  PRELIMINARY  PRELIMINARY  Bilateral lower extremity venous Dopplers completed.    Preliminary report:  There is acute, non occlusive DVT noted in the distal left femoral vein.  There is acute, occlusive DVT noted in the left popliteal vein.  All other veins appear thrombus free.  Divante Kotch, RVT 06/25/2013, 9:11 AM

## 2013-06-26 DIAGNOSIS — L02619 Cutaneous abscess of unspecified foot: Secondary | ICD-10-CM

## 2013-06-26 DIAGNOSIS — N390 Urinary tract infection, site not specified: Secondary | ICD-10-CM

## 2013-06-26 LAB — BASIC METABOLIC PANEL
BUN: 21 mg/dL (ref 6–23)
CO2: 26 mEq/L (ref 19–32)
Calcium: 8.7 mg/dL (ref 8.4–10.5)
Creatinine, Ser: 0.56 mg/dL (ref 0.50–1.10)
GFR calc Af Amer: >90 mL/min (ref >90)
Sodium: 142 mEq/L (ref 135–145)

## 2013-06-26 LAB — GLUCOSE, CAPILLARY
Glucose-Capillary: 77 mg/dL (ref 70–99)
Glucose-Capillary: 178 mg/dL — ABNORMAL HIGH (ref 70–99)
Glucose-Capillary: 85 mg/dL (ref 70–99)

## 2013-06-26 LAB — URINE CULTURE

## 2013-06-26 LAB — CBC
HCT: 35.6 % — ABNORMAL LOW (ref 36.0–46.0)
MCH: 33.2 pg (ref 26.0–34.0)
MCV: 96.7 fL (ref 78.0–100.0)
Platelets: 120 10*3/uL — ABNORMAL LOW (ref 150–400)
RBC: 3.68 MIL/uL — ABNORMAL LOW (ref 3.87–5.11)
RDW: 12.5 % (ref 11.5–15.5)

## 2013-06-26 LAB — PROTEIN S, TOTAL: Protein S Ag, Total: 90 % (ref 60–150)

## 2013-06-26 MED ORDER — PREDNISONE 10 MG PO TABS
10.0000 mg | ORAL_TABLET | Freq: Every day | ORAL | Status: DC
  Administered 2013-06-27 – 2013-06-30 (×4): 10 mg via ORAL
  Filled 2013-06-26 (×6): qty 1

## 2013-06-26 MED ORDER — DEXTROSE 5 % IV SOLN
1.0000 g | INTRAVENOUS | Status: DC
  Administered 2013-06-26 – 2013-06-29 (×4): 1 g via INTRAVENOUS
  Filled 2013-06-26 (×6): qty 10

## 2013-06-26 MED ORDER — GLIMEPIRIDE 4 MG PO TABS
4.0000 mg | ORAL_TABLET | Freq: Every day | ORAL | Status: DC
  Administered 2013-06-27 – 2013-06-30 (×4): 4 mg via ORAL
  Filled 2013-06-26 (×6): qty 1

## 2013-06-26 NOTE — Care Management Note (Signed)
    Page 1 of 2   06/30/2013     3:38:22 PM   CARE MANAGEMENT NOTE 06/30/2013  Patient:  SHANIEKA, BLEA   Account Number:  [id number]  Date Initiated:  06/25/2013  Documentation initiated by:  Donn Pierini  Subjective/Objective Assessment:   Pt admitted with bil PE     Action/Plan:   PTA pt lived at home with spouse- w/c bound-- NCM to follow for d/c needs   Anticipated DC Date:  06/30/2013   Anticipated DC Plan:  IP REHAB FACILITY  In-house referral  Clinical Social Worker      DC Planning Services  CM consult      Choice offered to / List presented to:             Status of service:  Completed, signed off Medicare Important Message given?   (If response is "NO", the following Medicare IM given date fields will be blank) Date Medicare IM given:   Date Additional Medicare IM given:    Discharge Disposition:  IP REHAB FACILITY  Per UR Regulation:  Reviewed for med. necessity/level of care/duration of stay  If discussed at Long Length of Stay Meetings, dates discussed:   06/30/2013    Comments:  06/30/13 Adrena Nakamura,RN,BSN [phone] PT ACCEPTED TO [facility], FOR ADMISSION TODAY.  06/29/13 Jamesrobert Ohanesian,RN,BSN [phone] PT GIVEN 30 DAY FREE TRIAL CARD FOR XARELTO.  P.T. CONSULT DONE TODAY; RECOMMENDATION IS FOR IP REHAB AT DC.  REHAB CONSULT ORDERED BY MD.  WILL FOLLOW PROGRESS.  CSW CONSULTED FOR BACKUP, SHOULD CIR NOT ACCEPT PT.  06/26/13- 1430- Donn Pierini RN, BSN [phone] Benefits check completed on Xarelto- pt does have coverage- Copay $45.00 / 30 day supply and prior auth is required by calling [phone]- sticky note left for MD to call for pre-auth.

## 2013-06-26 NOTE — Progress Notes (Deleted)
IV infiltrated, blood back to blood bank.  Cannot find vein.  IV team called, awaiting call back.

## 2013-06-26 NOTE — Progress Notes (Signed)
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 She presented to the ER for dyspnea and chest pressure and was found to have b/l Pulmonary embolism and an occlusive DVT in the left popliteal vein.   Assessment/Plan: Principal Problem:   Pulmonary embolism/ DVT - right heart strain on CT chest- did not need TPA - BP significantly improvedwith hydration - have switched from heparin to Xarelto - 11/20 -pulm consult appreciated- they have signed off   Active Problems:     DM type 2 (diabetes mellitus, type 2)/ DKA - A1c is about 8 - cont Amaryl but increase to 4 mg daily - holding Glucophage due to contrasted Ct- can resume today   Sarcoidosis - on chronic prednisone at home - on stress dose steroids- cont to taper to home dose    Paraparesis/ Wheelchair bound/ chronic pain in back and legs - was able to transfer to chair at home - PRN Vicodin  UTI- E coli - currently on Doxy but culture did not include this in sensitivities- will switch to Rocephin to which it is sensitive.  - on Trimethoprim at home    Cellulitis of toe- right - wound is healed- no significant tenderness or redness today-switching from Doxy to Rocephin as above    Code Status: DNR Family Communication: with husband on 11/20 Disposition Plan: follow in SDU  Consultants: Pulm  Procedures: none  Antibiotics: Doxycycline 11/19>>  DVT prophylaxis: Heparin infusion/ Xarelto  HPI/Subjective: Pt much more alert today- thinks she will be able to transfer from bed to chair today- good appetite- no dyspnea or chest pain    Objective: Blood pressure 148/94, pulse  110, temperature 97.5 F (36.4 C), temperature source Oral, resp. rate 20, height 5\' 6"  (1.676 m), weight 54.1 kg (119 lb 4.3 oz), SpO2 99.00%.  Intake/Output Summary (Last 24 hours) at 06/26/13 1421 Last data filed at 06/26/13 1200  Gross per 24 hour  Intake   2000 ml  Output   1225 ml  Net    775 ml     Exam: General: No acute respiratory distress Lungs: Clear to auscultation bilaterally without wheezes or crackles Cardiovascular: Regular rate and rhythm without murmur gallop or rub normal S1 and S2- HR 100-115 Abdomen: Nontender, nondistended, soft, bowel sounds positive, no rebound, no ascites, no appreciable mass Extremities: No significant cyanosis, clubbing, or edema bilateral lower extremities- good pedal pulses Skin- small ulcer on right 4th toe at the tip- no discharge, tenderness or erythema   Data Reviewed: Basic Metabolic Panel:  Recent Labs Lab 06/25/13 0140 06/25/13 0355 06/25/13 1250 06/25/13 1830 06/26/13 0430  NA 136 141 140 143 142  K 4.6 4.0 4.3 3.6 3.6  CL 103 107 108 111 109  CO2 13* 17* 22 25 26   GLUCOSE 411* 311* 136* 130* 95  BUN 23 23 22 20 21   CREATININE 0.72 0.77 0.64 0.55 0.56  CALCIUM 9.4 9.6 9.3 9.6 8.7   Liver Function Tests: No results found for this basename: AST, ALT, ALKPHOS, BILITOT, PROT, ALBUMIN,  in the last 168 hours No results found for this basename: LIPASE, AMYLASE,  in the last 168 hours No results found for this basename: AMMONIA,  in  the last 168 hours CBC:  Recent Labs Lab 06/24/13 1220 06/25/13 0140 06/25/13 1250 06/26/13 0435  WBC 18.6* 13.0* 20.9* 22.9*  NEUTROABS 16.7*  --   --   --   HGB 14.0 13.6 12.9 12.2  HCT 42.3 39.9 37.5 35.6*  MCV 100.7* 97.8 96.6 96.7  PLT 172 142* 122* 120*   Cardiac Enzymes:  Recent Labs Lab 06/24/13 1400  TROPONINI <0.30   BNP (last 3 results)  Recent Labs  06/24/13 1220  PROBNP 9584.0*   CBG:  Recent Labs Lab 06/25/13 2009 06/25/13 2100 06/25/13 2205  06/26/13 0736 06/26/13 1258  GLUCAP 141* 145* 109* 77 178*    Recent Results (from the past 240 hour(s))  URINE CULTURE     Status: None   Collection Time    06/24/13  2:59 PM      Result Value Range Status   Specimen Description URINE, CATHETERIZED   Final   Special Requests NONE   Final   Culture  Setup Time     Final   Value: 06/24/2013 16:17     Performed at [facility] Count     Final   Value: >=100,000 COLONIES/ML     Performed at [facility]   Culture     Final   Value: ESCHERICHIA COLI     Performed at [facility]   Report Status 06/26/2013 FINAL   Final   Organism ID, Bacteria ESCHERICHIA COLI   Final  MRSA PCR SCREENING     Status: None   Collection Time    06/25/13 12:42 AM      Result Value Range Status   MRSA by PCR NEGATIVE  NEGATIVE Final   Comment:            The GeneXpert MRSA Assay (FDA     approved for NASAL specimens     only), is one component of a     comprehensive MRSA colonization     surveillance program. It is not     intended to diagnose MRSA     infection nor to guide or     monitor treatment for     MRSA infections.     Studies:  Recent x-ray studies have been reviewed in detail by the Attending Physician  Scheduled Meds:  Scheduled Meds: . calcium-vitamin D  1 tablet Oral Q breakfast  . darifenacin  15 mg Oral Daily  . doxycycline (VIBRAMYCIN) IV  100 mg Intravenous Q12H  . glimepiride  2 mg Oral Daily  . hydrocortisone sod succinate (SOLU-CORTEF) inj  50 mg Intravenous Q12H  . insulin aspart  0-5 Units Subcutaneous QHS  . insulin aspart  0-9 Units Subcutaneous TID WC  . multivitamin with minerals  1 tablet Oral Daily  . mupirocin ointment  1 application Nasal BID  . Rivaroxaban  15 mg Oral BID WC   Followed by  . [START ON 07/16/2013] rivaroxaban  20 mg Oral Q supper  . sodium chloride  10-40 mL Intracatheter Q12H  . sodium chloride  3 mL Intravenous Q12H   Continuous Infusions: . sodium  chloride 125 mL/hr at 06/25/13 2027    Time spent on care of this patient: 45 min   Antwoine Zorn, MD  [facility] Office  [phone] Pager - Text Page per Loretha Stapler as per below:  On-Call/Text Page:      [email or web address]      password [email or web address]  If 7PM-7AM, please contact night-coverage [email or web address] Password [facility] 06/26/2013,  2:21 PM   LOS: 2 days

## 2013-06-27 DIAGNOSIS — L02619 Cutaneous abscess of unspecified foot: Secondary | ICD-10-CM

## 2013-06-27 LAB — BASIC METABOLIC PANEL
CO2: 25 mEq/L (ref 19–32)
Creatinine, Ser: 0.56 mg/dL (ref 0.50–1.10)
GFR calc non Af Amer: >90 mL/min (ref >90)
Glucose, Bld: 63 mg/dL — ABNORMAL LOW (ref 70–99)
Potassium: 3.4 mEq/L — ABNORMAL LOW (ref 3.5–5.1)
Sodium: 140 mEq/L (ref 135–145)

## 2013-06-27 LAB — CBC
Hemoglobin: 12.1 g/dL (ref 12.0–15.0)
Platelets: 122 10*3/uL — ABNORMAL LOW (ref 150–400)
RBC: 3.68 MIL/uL — ABNORMAL LOW (ref 3.87–5.11)

## 2013-06-27 LAB — GLUCOSE, CAPILLARY
Glucose-Capillary: 145 mg/dL — ABNORMAL HIGH (ref 70–99)
Glucose-Capillary: 223 mg/dL — ABNORMAL HIGH (ref 70–99)

## 2013-06-27 MED ORDER — POTASSIUM CHLORIDE CRYS ER 20 MEQ PO TBCR
40.0000 meq | EXTENDED_RELEASE_TABLET | Freq: Once | ORAL | Status: AC
  Administered 2013-06-27: 20 meq via ORAL
  Filled 2013-06-27: qty 2

## 2013-06-27 MED ORDER — POTASSIUM CHLORIDE 20 MEQ/15ML (10%) PO LIQD
20.0000 meq | Freq: Once | ORAL | Status: AC
  Administered 2013-06-27: 20 meq via ORAL
  Filled 2013-06-27: qty 15

## 2013-06-27 NOTE — Progress Notes (Addendum)
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 She presented to the ER for dyspnea and chest pressure and was found to have b/l Pulmonary embolism and an occlusive DVT in the left popliteal vein.   Assessment/Plan: Principal Problem:   Pulmonary embolism/ DVT in left leg  -significant right heart strain on CT chest and on ECHO-  did not need TPA as BP improved - BP significantly improved with hydration - have switched from heparin to Xarelto - 11/20 -pulm consult appreciated- they have signed off  - will allow to transfer out of SDU  today - tapering O2 slowly- may need to go home on 1-2 liters  Active Problems:     DM type 2 (diabetes mellitus, type 2)/ DKA - A1c is about 8 - cont Amaryl but increase to 4 mg daily - resumed Glucophage  Thrombocytopenia - consumption due to clots??   Sarcoidosis - on chronic prednisone at home - has been on stress dose steroids- now tapered to home dose    Paraparesis/ Wheelchair bound/ chronic pain in back and legs - was able to transfer to chair at home - PRN Vicodin  UTI- E coli - currently on Doxy but culture did not include this in sensitivities- have switched to Rocephin to which it is sensitive - Recommend 1 wk treatment as she is slightly immunocompromised  - on Trimethoprim at home    Cellulitis of toe- right - wound is healed- no significant tenderness or redness today-switching from Doxy to Rocephin as above    Code Status: DNR Family Communication: with husband on 11/20 Disposition Plan: transfer to med/surg- awaiting PT eval. Pt was able to transfer to chair at  home  Consultants: Pulm  Procedures: none  Antibiotics: Doxycycline 11/19>>  DVT prophylaxis: Heparin infusion/ Xarelto  HPI/Subjective: Pt alert- poor PO intake- was not taken out of bed yesterday by RN or by PT   Objective: Blood pressure 117/63, pulse 103, temperature 97.4 F (36.3 C), temperature source Oral, resp. rate 21, height 5\' 6"  (1.676 m), weight 54.568 kg (120 lb 4.8 oz), SpO2 95.00%.  Intake/Output Summary (Last 24 hours) at 06/27/13 1544 Last data filed at 06/27/13 1541  Gross per 24 hour  Intake 2651.33 ml  Output   2125 ml  Net 526.33 ml     Exam: General: No acute respiratory distress Lungs: Clear to auscultation bilaterally without wheezes or crackles Cardiovascular: Regular rate and rhythm without murmur gallop or rub normal S1 and S2- HR 100-115 Abdomen: Nontender, nondistended, soft, bowel sounds positive, no rebound, no ascites, no appreciable mass Extremities: No significant cyanosis, clubbing, or edema bilateral lower extremities- good pedal pulses Skin- small ulcer on right 4th toe at the tip- no discharge, tenderness or erythema   Data Reviewed: Basic Metabolic Panel:  Recent Labs Lab 06/25/13 0355 06/25/13 1250 06/25/13 1830 06/26/13 0430 06/27/13 0430  NA 141 140 143 142 140  K 4.0 4.3 3.6 3.6 3.4*  CL 107 108 111 109 108  CO2 17* 22 25 26 25   GLUCOSE 311* 136* 130* 95 63*  BUN 23 22 20 21 15   CREATININE 0.77 0.64 0.55 0.56 0.56  CALCIUM 9.6 9.3 9.6 8.7 8.4  Liver Function Tests: No results found for this basename: AST, ALT, ALKPHOS, BILITOT, PROT, ALBUMIN,  in the last 168 hours No results found for this basename: LIPASE, AMYLASE,  in the last 168 hours No results found for this basename: AMMONIA,  in the last 168 hours CBC:  Recent Labs Lab 06/24/13 1220 06/25/13 0140 06/25/13 1250 06/26/13 0435 06/27/13 0430  WBC 18.6* 13.0* 20.9* 22.9* 16.9*  NEUTROABS 16.7*  --   --   --   --   HGB 14.0 13.6 12.9 12.2 12.1   HCT 42.3 39.9 37.5 35.6* 36.3  MCV 100.7* 97.8 96.6 96.7 98.6  PLT 172 142* 122* 120* 122*   Cardiac Enzymes:  Recent Labs Lab 06/24/13 1400  TROPONINI <0.30   BNP (last 3 results)  Recent Labs  06/24/13 1220  PROBNP 9584.0*   CBG:  Recent Labs Lab 06/26/13 1547 06/26/13 2140 06/27/13 0635 06/27/13 1213 06/27/13 1514  GLUCAP 197* 85 97 145* 223*    Recent Results (from the past 240 hour(s))  URINE CULTURE     Status: None   Collection Time    06/24/13  2:59 PM      Result Value Range Status   Specimen Description URINE, CATHETERIZED   Final   Special Requests NONE   Final   Culture  Setup Time     Final   Value: 06/24/2013 16:17     Performed at [facility] Count     Final   Value: >=100,000 COLONIES/ML     Performed at [facility]   Culture     Final   Value: ESCHERICHIA COLI     Performed at [facility]   Report Status 06/26/2013 FINAL   Final   Organism ID, Bacteria ESCHERICHIA COLI   Final  MRSA PCR SCREENING     Status: None   Collection Time    06/25/13 12:42 AM      Result Value Range Status   MRSA by PCR NEGATIVE  NEGATIVE Final   Comment:            The GeneXpert MRSA Assay (FDA     approved for NASAL specimens     only), is one component of a     comprehensive MRSA colonization     surveillance program. It is not     intended to diagnose MRSA     infection nor to guide or     monitor treatment for     MRSA infections.     Studies:  Recent x-ray studies have been reviewed in detail by the Attending Physician  Scheduled Meds:  Scheduled Meds: . calcium-vitamin D  1 tablet Oral Q breakfast  . cefTRIAXone (ROCEPHIN)  IV  1 g Intravenous Q24H  . darifenacin  15 mg Oral Daily  . glimepiride  4 mg Oral Q breakfast  . insulin aspart  0-5 Units Subcutaneous QHS  . insulin aspart  0-9 Units Subcutaneous TID WC  . multivitamin with minerals  1 tablet Oral Daily  . mupirocin ointment  1 application  Nasal BID  . predniSONE  10 mg Oral Q breakfast  . Rivaroxaban  15 mg Oral BID WC   Followed by  . [START ON 07/16/2013] rivaroxaban  20 mg Oral Q supper  . sodium chloride  10-40 mL Intracatheter Q12H  . sodium chloride  3 mL Intravenous Q12H   Continuous Infusions: . sodium chloride 125 mL/hr at 06/26/13 2332    Time  spent on care of this patient: 35 min   Calvert Cantor, MD  [facility] Office  [phone] Pager - Text Page per Loretha Stapler as per below:  On-Call/Text Page:      [email or web address]      password [email or web address]  If 7PM-7AM, please contact night-coverage [email or web address] Password [facility] 06/27/2013, 3:44 PM   LOS: 3 days

## 2013-06-28 LAB — BASIC METABOLIC PANEL
BUN: 17 mg/dL (ref 6–23)
CO2: 26 mEq/L (ref 19–32)
Calcium: 8.4 mg/dL (ref 8.4–10.5)
Chloride: 108 mEq/L (ref 96–112)
Creatinine, Ser: 0.58 mg/dL (ref 0.50–1.10)
GFR calc Af Amer: >90 mL/min (ref >90)

## 2013-06-28 LAB — CBC
HCT: 35.5 % — ABNORMAL LOW (ref 36.0–46.0)
MCH: 33.2 pg (ref 26.0–34.0)
MCV: 99.2 fL (ref 78.0–100.0)
RBC: 3.58 MIL/uL — ABNORMAL LOW (ref 3.87–5.11)
RDW: 12.5 % (ref 11.5–15.5)
WBC: 11.5 10*3/uL — ABNORMAL HIGH (ref 4.0–10.5)

## 2013-06-28 LAB — GLUCOSE, CAPILLARY
Glucose-Capillary: 188 mg/dL — ABNORMAL HIGH (ref 70–99)
Glucose-Capillary: 246 mg/dL — ABNORMAL HIGH (ref 70–99)
Glucose-Capillary: 110 mg/dL — ABNORMAL HIGH (ref 70–99)

## 2013-06-28 NOTE — Progress Notes (Signed)
Report called to receiving RN on 2W. Pt transported by RN in be to Room 2w18. All belongings went with pt.

## 2013-06-28 NOTE — Progress Notes (Signed)
[facility] Progress Note  TEAM 1 - Stepdown/ICU TEAM   Elizabeth Drake ZOX:[id number] DOB: 1941/05/15 DOA: 06/24/2013 PCP: Rudi Heap, MD  Brief narrative: 72 y/o female with 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 She presented to the ER for dyspnea and chest pressure and was found to have b/l Pulmonary embolism and an occlusive DVT in the left popliteal vein.   Assessment/Plan: Principal Problem:   Pulmonary embolism/ DVT in left leg  -significant right heart strain on CT chest and on ECHO-  did not need TPA as BP improved - BP significantly improved with hydration - have switched from heparin to Xarelto - 11/20 -pulm consult appreciated- they have signed off  - will allow to transfer out of SDU  today - tapered off of O2 now  Active Problems:     DM type 2 (diabetes mellitus, type 2)/ DKA - A1c is about 8 - cont Amaryl but increase to 4 mg daily - resumed Glucophage  Thrombocytopenia - consumption due to clots? 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 Pt was able to transfer to chair at home-    Consultants: Pulm  Procedures: none  Antibiotics: Doxycycline 11/19>>  DVT prophylaxis: Heparin infusion/ Xarelto  HPI/Subjective: Pt alert- was again not taken out of bed yesterday- per Pt "no one got me out of bed" Per RN, pt refused transfer multiple times stating she was tired. Felt lightheaded after sitting up in bed this morning but now resolved.    Objective: Blood pressure 129/80, pulse 99, temperature 98.6 F (37 C), temperature source Oral, resp. rate 16, height 5\' 6"  (1.676 m), weight 56.2 kg (123 lb 14.4 oz), SpO2 96.00%.  Intake/Output Summary (Last 24 hours) at 06/28/13 1600 Last data filed at 06/28/13 1200  Gross per 24 hour  Intake    223 ml  Output    975 ml  Net   -752 ml     Exam: General: No acute respiratory distress Lungs: Clear to auscultation bilaterally without wheezes or crackles Cardiovascular: Regular rate and rhythm without murmur gallop or rub normal S1 and S2- HR 100-115 Abdomen: Nontender, nondistended, soft, bowel sounds positive, no rebound, no ascites, no appreciable mass Extremities: No significant cyanosis, clubbing, or edema bilateral lower extremities- good pedal pulses Skin- small ulcer on right 4th toe at the tip- no discharge, tenderness or erythema   Data Reviewed: Basic Metabolic Panel:  Recent Labs Lab 06/25/13 1250 06/25/13 1830 06/26/13 0430 06/27/13 0430 06/28/13 0400  NA 140 143 142 140 139  K 4.3 3.6 3.6 3.4* 4.2  CL 108 111 109 108 108  CO2 22 25 26 25  26  GLUCOSE 136* 130* 95 63* 146*  BUN 22 20 21 15 17   CREATININE 0.64 0.55 0.56 0.56 0.58  CALCIUM 9.3 9.6 8.7 8.4 8.4   Liver Function Tests: No results found for this basename: AST, ALT, ALKPHOS, BILITOT, PROT, ALBUMIN,  in the last 168 hours No results found for this basename: LIPASE, AMYLASE,  in the last 168 hours No results found for this basename: AMMONIA,  in the last 168 hours CBC:  Recent Labs Lab 06/24/13 1220 06/25/13 0140 06/25/13 1250  06/26/13 0435 06/27/13 0430 06/28/13 0400  WBC 18.6* 13.0* 20.9* 22.9* 16.9* 11.5*  NEUTROABS 16.7*  --   --   --   --   --   HGB 14.0 13.6 12.9 12.2 12.1 11.9*  HCT 42.3 39.9 37.5 35.6* 36.3 35.5*  MCV 100.7* 97.8 96.6 96.7 98.6 99.2  PLT 172 142* 122* 120* 122* 132*   Cardiac Enzymes:  Recent Labs Lab 06/24/13 1400  TROPONINI <0.30   BNP (last 3 results)  Recent Labs  06/24/13 1220  PROBNP 9584.0*   CBG:  Recent Labs Lab 06/27/13 1213 06/27/13 1514 06/27/13 2154 06/28/13 0745 06/28/13 1131  GLUCAP 145* 223* 189* 130* 188*    Recent Results (from the past 240 hour(s))  URINE CULTURE     Status: None   Collection Time    06/24/13  2:59 PM      Result Value Range Status   Specimen Description URINE, CATHETERIZED   Final   Special Requests NONE   Final   Culture  Setup Time     Final   Value: 06/24/2013 16:17     Performed at [facility] Count     Final   Value: >=100,000 COLONIES/ML     Performed at [facility]   Culture     Final   Value: ESCHERICHIA COLI     Performed at [facility]   Report Status 06/26/2013 FINAL   Final   Organism ID, Bacteria ESCHERICHIA COLI   Final  MRSA PCR SCREENING     Status: None   Collection Time    06/25/13 12:42 AM      Result Value Range Status   MRSA by PCR NEGATIVE  NEGATIVE Final   Comment:            The GeneXpert MRSA Assay (FDA     approved for NASAL specimens     only), is one component of a     comprehensive MRSA colonization     surveillance program. It is not     intended to diagnose MRSA     infection nor to guide or     monitor treatment for     MRSA infections.     Studies:  Recent x-ray studies have been reviewed in detail by the Attending Physician  Scheduled Meds:  Scheduled Meds: . calcium-vitamin D  1 tablet Oral Q breakfast  . cefTRIAXone (ROCEPHIN)  IV  1 g Intravenous Q24H  . darifenacin  15 mg Oral Daily  . glimepiride  4 mg Oral Q breakfast  .  insulin aspart  0-5 Units Subcutaneous QHS  . insulin aspart  0-9 Units Subcutaneous TID WC  . multivitamin with minerals  1 tablet Oral Daily  . mupirocin ointment  1 application Nasal BID  . predniSONE  10 mg Oral Q breakfast  . Rivaroxaban  15 mg Oral BID WC   Followed by  . [START ON 07/16/2013] rivaroxaban  20 mg Oral Q supper  . sodium chloride  10-40 mL Intracatheter Q12H  . sodium chloride  3 mL Intravenous Q12H   Continuous Infusions:    Time spent on care of this patient: 35 min   Jaquail Mclees, MD  [facility] Office  [phone] Pager - Text Page per Loretha Stapler as per below:  On-Call/Text Page:      [email or web address]      password [email or web address]  If 7PM-7AM, please contact night-coverage [email or web address] Password [email or web address] 06/28/2013, 4:00 PM   LOS: 4 days

## 2013-06-28 NOTE — Progress Notes (Signed)
PT Cancellation Note  Patient Details Name: Elizabeth Drake MRN: [id number] DOB: [birth year]   Cancelled Treatment:    Reason Eval/Treat Not Completed: Other (comment)   Attempted PT eval  Pt politely declined PT; had been OOB most of the day  Will return for PT eval tomorrow   Van Clines [facility] 06/28/2013, 4:08 PM

## 2013-06-28 NOTE — Progress Notes (Signed)
Pt out of bed in chair from 9am to 1500. Tolerated well.

## 2013-06-28 NOTE — Progress Notes (Signed)
Pt Refused to get out of bed at 1400, 1545, 1730. Pt is resting comfortly in bed with call bell in reach. Will educate and continue to monitor.

## 2013-06-29 ENCOUNTER — Ambulatory Visit: Payer: Medicare Other | Admitting: Nurse Practitioner

## 2013-06-29 DIAGNOSIS — R5381 Other malaise: Secondary | ICD-10-CM

## 2013-06-29 LAB — GLUCOSE, CAPILLARY
Glucose-Capillary: 69 mg/dL — ABNORMAL LOW (ref 70–99)
Glucose-Capillary: 115 mg/dL — ABNORMAL HIGH (ref 70–99)
Glucose-Capillary: 148 mg/dL — ABNORMAL HIGH (ref 70–99)
Glucose-Capillary: 168 mg/dL — ABNORMAL HIGH (ref 70–99)
Glucose-Capillary: 120 mg/dL — ABNORMAL HIGH (ref 70–99)

## 2013-06-29 LAB — CBC
HCT: 36.3 % (ref 36.0–46.0)
Hemoglobin: 12 g/dL (ref 12.0–15.0)
RBC: 3.64 MIL/uL — ABNORMAL LOW (ref 3.87–5.11)
WBC: 12.7 10*3/uL — ABNORMAL HIGH (ref 4.0–10.5)

## 2013-06-29 MED ORDER — METOPROLOL TARTRATE 25 MG PO TABS
25.0000 mg | ORAL_TABLET | Freq: Two times a day (BID) | ORAL | Status: DC
  Administered 2013-06-29 – 2013-06-30 (×2): 25 mg via ORAL
  Filled 2013-06-29 (×3): qty 1

## 2013-06-29 MED ORDER — METOPROLOL TARTRATE 25 MG PO TABS: 25.0000 mg | ORAL_TABLET | Freq: Two times a day (BID) | ORAL | Status: DC

## 2013-06-29 MED ORDER — RIVAROXABAN 15 MG PO TABS: 15.0000 mg | ORAL_TABLET | Freq: Two times a day (BID) | ORAL | Status: DC

## 2013-06-29 MED ORDER — METFORMIN HCL 500 MG PO TABS: 500.0000 mg | ORAL_TABLET | Freq: Two times a day (BID) | ORAL | Status: DC

## 2013-06-29 MED ORDER — CEFUROXIME AXETIL 250 MG PO TABS: 250.0000 mg | ORAL_TABLET | Freq: Two times a day (BID) | ORAL | Status: DC

## 2013-06-29 MED ORDER — GLIMEPIRIDE 4 MG PO TABS: 2.0000 mg | ORAL_TABLET | Freq: Every day | ORAL | Status: DC

## 2013-06-29 NOTE — Evaluation (Signed)
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 She presented to the ER for dyspnea and chest pressure and was found to have b/l Pulmonary embolism and an occlusive DVT in the left popliteal vein.   Clinical Impression  Pt thought she would just "hop out of bed" as she normally would, however after several days of limited activity, pt required max assist to transfer bed to [facility] to recliner. She quickly realized she and her husband could not manage at home (unless she was bed bound with bedpan, which pt does not want). Pt will benefit from continued PT to regain modified independent status at w/c level prior to d/c home.    PT Assessment  Patient needs continued PT services    Follow Up Recommendations  CIR    Does the patient have the potential to tolerate intense rehabilitation      Barriers to Discharge Decreased caregiver support pt reports husband can't provide max assist for transfer    Equipment Recommendations  Other (comment) (if home, hospital bed, bedpan, hoyer lift)    Recommendations for Other Services Rehab consult;OT consult   Frequency Min 4X/week    Precautions / Restrictions Precautions Precautions: Fall Restrictions Weight Bearing Restrictions: No   Pertinent Vitals/Pain Chronic back and leg pain--not rated      Mobility  Bed Mobility Bed Mobility: Rolling Right;Right Sidelying to Sit;Sitting - Scoot to Edge of Bed Rolling Right: 6: Modified independent (Device/Increase time);With rail Right Sidelying to Sit: 3: Mod assist;With rails;HOB flat Sitting -  Scoot to Edge of Bed: 3: Mod assist;With rail Details for Bed Mobility Assistance: pt very motivated and attempted all movements alone prior to assist provided (assist due to weakness) Transfers Transfers: Heritage manager Transfers: 2: Max assist;With upper extremity assistance;With armrests Details for Transfer Assistance: quad lift type squat-pivot due to extreme kyphosis; x1 to right (to [facility]) x1 to Lt (to recliner) Naval architect Mobility: No    Exercises     PT Diagnosis: Generalized weakness  PT Problem List: Decreased strength;Decreased activity tolerance;Decreased balance;Decreased mobility;Decreased knowledge of use of DME;Impaired sensation PT Treatment Interventions: DME instruction;Functional mobility training;Therapeutic activities;Therapeutic exercise;Balance training;Patient/family education;Wheelchair mobility training     PT Goals(Current goals can be found in the care plan section) Acute Rehab PT Goals Patient Stated Goal: be able to transfer modified independent to w/c PT Goal Formulation: With patient Time For Goal Achievement: 07/06/13 Potential to Achieve Goals: Good  Visit Information  Last PT Received On: 06/29/13 Assistance Needed: +1 History of Present Illness: 72 y/o female with 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 She presented to the ER for dyspnea and chest pressure and was found to have b/l Pulmonary embolism and an occlusive DVT in the left popliteal vein.        Prior Functioning  Home Living Family/patient expects to be discharged to:: Inpatient rehab Living Arrangements: Spouse/significant other;Children Additional Comments: son lives with them ~ 1/2 the time Prior Function Level of Independence: Independent with assistive device(s) Comments: modified  independent at wheelchair transfer level Communication Communication: No difficulties    Cognition  Cognition Arousal/Alertness: Awake/alert Behavior During Therapy: WFL for tasks assessed/performed Overall Cognitive Status: Within Functional Limits for tasks assessed    Extremity/Trunk Assessment Upper Extremity Assessment Upper Extremity Assessment: Generalized weakness Lower Extremity Assessment Lower Extremity Assessment: RLE deficits/detail;LLE deficits/detail RLE Deficits / Details: AAROM WFL, hip flex 3, hip ext 2, knee extension 2+ RLE Sensation: history of peripheral neuropathy LLE Deficits / Details: AAROM WFL; hip flex 3/5, hip ext 2+, knee ext 3- LLE Sensation: history of peripheral neuropathy Cervical / Trunk Assessment Cervical / Trunk Assessment: Kyphotic;Other exceptions Cervical / Trunk Exceptions: severe kyphosis with scoliosis; requires bil UE support to maintain sitting due to trunk weakness   Balance Balance Balance Assessed: Yes Static Sitting Balance Static Sitting - Balance Support: Bilateral upper extremity supported;Feet supported Static Sitting - Level of Assistance: 3: Mod assist  End of Session PT - End of Session Equipment Utilized During Treatment: Gait belt Activity Tolerance: Patient tolerated treatment well;Patient limited by fatigue Patient left: in chair;with call bell/phone within reach Nurse Communication: Mobility status;Other (comment) (do not recommend d/c home)  GP     Inaki Vantine 06/29/2013, 11:46 AM Pager [phone]

## 2013-06-29 NOTE — Progress Notes (Signed)
Rehab Admissions Coordinator Note:  Patient was screened by Clois Dupes for appropriateness for an Inpatient Acute Rehab Consult.  Noted discharge summary written. I have contacted Dr. Thedore Mins of therapy recommendation for inpt rehab consult if he felt appropriate at this time.  Clois Dupes 06/29/2013, 11:58 AM  I can be reached at [phone].

## 2013-06-29 NOTE — Progress Notes (Signed)
I will follow up with pt and family in the morning to assist in determining if pt prefers inpt rehab admission vs SNF rehab and agrees to intensity of therapy expected. [phone]

## 2013-06-29 NOTE — Progress Notes (Signed)
Hypoglycemic Event  CBG: 69  Treatment: Oral fluids   Symptoms: None  Follow-up CBG: [id number] CBG Result:115    Comments/MD notified:On-call NP notified    Orson Ape D  Remember to initiate Hypoglycemia Order Set & complete

## 2013-06-29 NOTE — Consult Note (Signed)
Physical Medicine and Rehabilitation Consult Reason for Consult: Deconditioning/pulmonary emboli Referring Physician: [facility]   HPI: Elizabeth Drake is a 72 y.o. 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 Patient recently treated for third toe cellulitis right foot. Elizabeth Drake is wheelchair-bound prior to admission independent with transfers no ambulation. Admitted 06/24/2013 with shortness of breath and chest heaviness that worsened with minimal exertion. Troponin and cardiac enzymes negative. CT angiogram chest showed extensive pulmonary embolus with evidence of right ventricular strain. Noted pulmonary emboli involve significant portions of both right and left main pulmonary arteries. Patient placed on intravenous heparin and transitioned to Xarelto. Venous Doppler studies lower extremities 06/25/2013 with findings consistent of acute DVT distal femoral-popliteal vein of left lower extremity. Urine culture greater than 100,000 Escherichia coli and maintained on Rocephin. Physical therapy evaluation completed 06/29/2013 with recommendations for physical medicine rehabilitation consult to consider inpatient rehabilitation services.  Patient has had severe weakness of the right > Left lower extremity since diagnosis of sarcoidosis around her spinal cord any years ago Review of Systems  Respiratory: Positive for shortness of breath.   Gastrointestinal: Positive for constipation.  Genitourinary: Positive for urgency.  Musculoskeletal: Positive for myalgias.  All other systems reviewed and are negative.   Past Medical History  Diagnosis Date  . Sarcoidosis   . Hiatal hernia   . Diabetes mellitus without complication   . Osteoporosis    Past Surgical History  Procedure Laterality Date  . Abdominal hysterectomy    . Joint replacement      toe  . Lung lobectomy Left     Lower   Family History  Problem Relation Age  of Onset  . Heart disease Mother   . Hypertension Mother   . Cancer Father     pancratic  . Cancer - Other Sister    Social History:  reports that she quit smoking about 12 years ago. Her smoking use included Cigarettes. She smoked 0.00 packs per day. She does not have any smokeless tobacco history on file. She reports that she does not drink alcohol or use illicit drugs. Allergies:  Allergies  Allergen Reactions  . Advil [Ibuprofen] Shortness Of Breath and Swelling  . Azo [Phenazopyridine] Other (See Comments)    Unknown reaction  . Indocin [Indomethacin] Other (See Comments)    Unknown reaction  . Loratadine Other (See Comments)    unknown  . Percocet [Oxycodone-Acetaminophen] Itching   Medications Prior to Admission  Medication Sig Dispense Refill  . calcium-vitamin D (OSCAL 500/200 D-3) 500-200 MG-UNIT per tablet Take 1 tablet by mouth daily with breakfast.      . darifenacin (ENABLEX) 15 MG 24 hr tablet Take 15 mg by mouth daily.      . diphenhydrAMINE (BENADRYL) 25 MG tablet Take 25 mg by mouth daily as needed for allergies.      Marland Kitchen HYDROcodone-acetaminophen (NORCO) 10-325 MG per tablet Take 1 tablet by mouth every 6 (six) hours as needed for severe pain.      . Multiple Vitamin (MULTIVITAMIN WITH MINERALS) TABS tablet Take 1 tablet by mouth daily.      . mupirocin ointment (BACTROBAN) 2 % Place 1 application into the nose 2 (two) times daily. Apply to toes      . ondansetron (ZOFRAN) 8 MG tablet Take 1 tablet (8 mg total) by mouth every 8 (eight) hours as needed for nausea.  20 tablet  1  . predniSONE (DELTASONE) 5 MG tablet Take  10 mg by mouth daily with breakfast.      . [DISCONTINUED] glimepiride (AMARYL) 2 MG tablet Take 1 tablet (2 mg total) by mouth daily.  30 tablet  1  . [DISCONTINUED] metFORMIN (GLUCOPHAGE) 500 MG tablet Take 1 tablet (500 mg total) by mouth daily with breakfast.  30 tablet  3  . [DISCONTINUED] trimethoprim (TRIMPEX) 100 MG tablet Take 100 mg by mouth  at bedtime.        Home: Home Living Family/patient expects to be discharged to:: Inpatient rehab Living Arrangements: Spouse/significant other;Children Additional Comments: son lives with them ~ 1/2 the time  Functional History: Prior Function Comments: modified independent at wheelchair transfer level Functional Status:  Mobility: Bed Mobility Bed Mobility: Rolling Right;Right Sidelying to Sit;Sitting - Scoot to Edge of Bed Rolling Right: 6: Modified independent (Device/Increase time);With rail Right Sidelying to Sit: 3: Mod assist;With rails;HOB flat Sitting - Scoot to Edge of Bed: 3: Mod assist;With rail Transfers Transfers: Heritage manager Transfers: 2: Max assist;With upper extremity assistance;With armrests   Wheelchair Mobility Wheelchair Mobility: No  ADL:    Cognition: Cognition Overall Cognitive Status: Within Functional Limits for tasks assessed Orientation Level: Oriented X4 Cognition Arousal/Alertness: Awake/alert Behavior During Therapy: WFL for tasks assessed/performed Overall Cognitive Status: Within Functional Limits for tasks assessed  Blood pressure 141/73, pulse 78, temperature 97.5 F (36.4 C), temperature source Oral, resp. rate 18, height 5\' 6"  (1.676 m), weight 56.7 kg (125 lb), SpO2 96.00%. Physical Exam  Nursing note reviewed. Constitutional: She is oriented to person, place, and time.  HENT:  Head: Normocephalic.  Eyes: EOM are normal.  Neck: Normal range of motion. Neck supple. No thyromegaly present.  Cardiovascular: Normal rate and regular rhythm.   Respiratory: Effort normal and breath sounds normal. No respiratory distress.  GI: Soft. Bowel sounds are normal. She exhibits no distension.  Neurological: She is alert and oriented to person, place, and time.  Follows full commands  Skin: Skin is warm and dry.  Psychiatric: She has a normal mood and affect.   motor strength is 4+/5 bilateral deltoid, bicep, tricep,  grip 2 minus right hip flexor knee extensor right ankle dorsiflexor plantar flexor 3 minus/5 left hip flexor knee extensor ankle dorsiflexor and plantar flexor Sensory intact to light touch in both upper and lower limb  Results for orders placed during the hospital encounter of 06/24/13 (from the past 24 hour(s))  GLUCOSE, CAPILLARY     Status: Abnormal   Collection Time    06/28/13  4:23 PM      Result Value Range   Glucose-Capillary 246 (*) 70 - 99 mg/dL  GLUCOSE, CAPILLARY     Status: Abnormal   Collection Time    06/28/13  9:18 PM      Result Value Range   Glucose-Capillary 110 (*) 70 - 99 mg/dL  CBC     Status: Abnormal   Collection Time    06/29/13  5:10 AM      Result Value Range   WBC 12.7 (*) 4.0 - 10.5 K/uL   RBC 3.64 (*) 3.87 - 5.11 MIL/uL   Hemoglobin 12.0  12.0 - 15.0 g/dL   HCT 40.3  47.4 - 25.9 %   MCV 99.7  78.0 - 100.0 fL   MCH 33.0  26.0 - 34.0 pg   MCHC 33.1  30.0 - 36.0 g/dL   RDW 56.3  87.5 - 64.3 %   Platelets 157  150 - 400 K/uL  GLUCOSE, CAPILLARY  Status: Abnormal   Collection Time    06/29/13  6:21 AM      Result Value Range   Glucose-Capillary 69 (*) 70 - 99 mg/dL  GLUCOSE, CAPILLARY     Status: Abnormal   Collection Time    06/29/13  6:49 AM      Result Value Range   Glucose-Capillary 115 (*) 70 - 99 mg/dL   Comment 1 Documented in Chart     Comment 2 Notify RN    GLUCOSE, CAPILLARY     Status: Abnormal   Collection Time    06/29/13 11:18 AM      Result Value Range   Glucose-Capillary 148 (*) 70 - 99 mg/dL   Comment 1 Notify RN     No results found.  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 Does the need for close, 24 hr/day medical supervision in concert with the patient's rehab needs make it unreasonable for this patient to be served in a less intensive setting? Yes 2. Co-Morbidities requiring supervision/potential complications: Type 2 diabetes,  osteoporosis with kyphoscoliosis 3. Due to bladder management, bowel management, safety, skin/wound care, disease management, medication administration, pain management and patient education, does the patient require 24 hr/day rehab nursing? Yes 4. Does the patient require coordinated care of a physician, rehab nurse, PT (1-2 hrs/day, 5 days/week) and OT (1-2 hrs/day, 5 days/week) to address physical and functional deficits in the context of the above medical diagnosis(es)? Yes Addressing deficits in the following areas: balance, endurance, locomotion, strength, transferring, bowel/bladder control, bathing, dressing, feeding, grooming and toileting 5. Can the patient actively participate in an intensive therapy program of at least 3 hrs of therapy per day at least 5 days per week? Yes 6. The potential for patient to make measurable gains while on inpatient rehab is good 7. Anticipated functional outcomes upon discharge from inpatient rehab are supervision wheelchair level with PT, supervision ADLs wheelchair level with OT, NA with SLP. 8. Estimated rehab length of stay to reach the above functional goals is: 12-14 days 9. Does the patient have adequate social supports to accommodate these discharge functional goals? Potentially 10. Anticipated D/C setting: Home 11. Anticipated post D/C treatments: HH therapy 12. Overall Rehab/Functional Prognosis: good  RECOMMENDATIONS: This patient's condition is appropriate for continued rehabilitative care in the following setting: CIR Patient has agreed to participate in recommended program. Yes Note that insurance prior authorization may be required for reimbursement for recommended care.  Comment:     06/29/2013

## 2013-06-29 NOTE — Discharge Summary (Addendum)
[facility]                                                                                   Elizabeth Drake, is a 72 y.o. female  DOB [birth year]  MRN [id number].  Admission date:  06/24/2013  Admitting Physician  Leroy Sea, MD  Discharge Date:  06/30/2013   Primary MD  Rudi Heap, MD  Recommendations for primary care physician for things to follow:   Follow CBC BMP closely.   Admission Diagnosis  Sarcoidosis [135] Hypoxemia [799.02] Paraparesis [344.1] Pulmonary embolism [415.19] Tachycardia [785.0] PE (pulmonary thromboembolism) [415.19] Chronic back pain [724.5, 338.29] Wheelchair bound [V46.3]  Discharge Diagnosis   acute bilateral PE and DVT  Principal Problem:   Pulmonary embolism Active Problems:   Hyperlipidemia   Sarcoidosis   DM type 2 (diabetes mellitus, type 2)   OAB (overactive bladder)   Paraparesis   Wheelchair bound   UTI (lower urinary tract infection)   Chronic back pain   PE (pulmonary thromboembolism)   Hypoxemia   Tachycardia   Cellulitis of foot without toes, right   DKA, type 2      Past Medical History  Diagnosis Date  . Sarcoidosis   . Hiatal hernia   . Diabetes mellitus without complication   . Osteoporosis     Past Surgical History  Procedure Laterality Date  . Abdominal hysterectomy    . Joint replacement      toe  . Lung lobectomy Left     Lower     Discharge Condition: Stable, poor long term prognosis due to multiple comorbidities       Follow-up Information   Follow up with Rudi Heap, MD. Schedule an appointment as soon as possible for a visit in 2 days.   Specialty:  Family Medicine   Contact information:   [address] [place] [address] [phone]         Consults obtained - PT, patient refused PT here or at home   Discharge Medications      Medication List    STOP taking these medications       trimethoprim 100 MG tablet  Commonly known as:  TRIMPEX      TAKE  these medications       cefUROXime 250 MG tablet  Commonly known as:  CEFTIN  Take 1 tablet (250 mg total) by mouth 2 (two) times daily with a meal.     darifenacin 15 MG 24 hr tablet  Commonly known as:  ENABLEX  Take 15 mg by mouth daily.     diphenhydrAMINE 25 MG tablet  Commonly known as:  BENADRYL  Take 25 mg by mouth daily as needed for allergies.     glimepiride 4 MG tablet  Commonly known as:  AMARYL  Take 0.5 tablets (2 mg total) by mouth daily with breakfast.     HYDROcodone-acetaminophen 10-325 MG per tablet  Commonly known as:  NORCO  Take 1 tablet by mouth every 6 (six) hours as needed for severe pain.     metFORMIN 500 MG tablet  Commonly known as:  GLUCOPHAGE  Take 1 tablet (500 mg total)  by mouth 2 (two) times daily with a meal.     metoprolol tartrate 25 MG tablet  Commonly known as:  LOPRESSOR  Take 1 tablet (25 mg total) by mouth 2 (two) times daily.     multivitamin with minerals Tabs tablet  Take 1 tablet by mouth daily.     mupirocin ointment 2 %  Commonly known as:  BACTROBAN  Place 1 application into the nose 2 (two) times daily. Apply to toes     ondansetron 8 MG tablet  Commonly known as:  ZOFRAN  Take 1 tablet (8 mg total) by mouth every 8 (eight) hours as needed for nausea.     OSCAL 500/200 D-3 500-200 MG-UNIT per tablet  Generic drug:  calcium-vitamin D  Take 1 tablet by mouth daily with breakfast.     predniSONE 5 MG tablet  Commonly known as:  DELTASONE  Take 10 mg by mouth daily with breakfast.     Rivaroxaban 15 MG Tabs tablet  Commonly known as:  XARELTO  Take 1 tablet (15 mg total) by mouth 2 (two) times daily with a meal.     tamsulosin 0.4 MG Caps capsule  Commonly known as:  FLOMAX  Take 1 capsule (0.4 mg total) by mouth daily.         Diet and Activity recommendation: See Discharge Instructions below   Discharge Instructions     Follow with Primary MD Rudi Heap, MD in 2 days   Get CBC, CMP, checked 2 days  by Primary MD and again as instructed by your Primary MD. Get a 2 view Chest X ray done next visit.  Get Medicines reviewed and adjusted.  Please request your Prim.MD to go over all Hospital Tests and Procedure/Radiological results at the follow up, please get all Hospital records sent to your Prim MD by signing hospital release before you go home.  Activity: As tolerated with Full fall precautions use walker/cane & assistance as needed   Diet:  Heart healthy low carbohydrate  Accuchecks 4 times/day, Once in AM empty stomach and then before each meal. Log in all results and show them to your Prim.MD in 3 days. If any glucose reading is under 80 or above 300 call your Prim MD immidiately. Follow Low glucose instructions for glucose under 80 as instructed.   Check your Weight same time everyday, if you gain over 2 pounds, or you develop in leg swelling, experience more shortness of breath or chest pain, call your Primary MD immediately. Follow Cardiac Low Salt Diet and 1.8 lit/day fluid restriction.  Disposition Home    If you experience worsening of your admission symptoms, develop shortness of breath, life threatening emergency, suicidal or homicidal thoughts you must seek medical attention immediately by calling 911 or calling your MD immediately  if symptoms less severe.  You Must read complete instructions/literature along with all the possible adverse reactions/side effects for all the Medicines you take and that have been prescribed to you. Take any new Medicines after you have completely understood and accpet all the possible adverse reactions/side effects.   Do not drive and provide baby sitting services if your were admitted for syncope or siezures until you have seen by Primary MD or a Neurologist and advised to do so again.  Do not drive when taking Pain medications.    Do not take more than prescribed Pain, Sleep and Anxiety Medications  Special Instructions: If you have  smoked or chewed Tobacco  in the last 2  yrs please stop smoking, stop any regular Alcohol  and or any Recreational drug use.  Wear Seat belts while driving.   Please note  You were cared for by a hospitalist during your hospital stay. If you have any questions about your discharge medications or the care you received while you were in the hospital after you are discharged, you can call the unit and asked to speak with the hospitalist on call if the hospitalist that took care of you is not available. Once you are discharged, your primary care physician will handle any further medical issues. Please note that NO REFILLS for any discharge medications will be authorized once you are discharged, as it is imperative that you return to your primary care physician (or establish a relationship with a primary care physician if you do not have one) for your aftercare needs so that they can reassess your need for medications and monitor your lab values.    Major procedures and Radiology Reports - PLEASE review detailed and final reports for all details, in brief -   Echo  - Left ventricle: The cavity size was normal. Wall thickness was increased in a pattern of moderate LVH. Systolic function was vigorous. The estimated ejection fraction was in the range of 65% to 70%. Wall motion was normal; there were no regional wall motion abnormalities. - Ventricular septum: The contour showed diastolic flattening. - Aortic valve: Mild regurgitation. - Mitral valve: Mild regurgitation. - Right ventricle: The cavity size was severely dilated. Systolic function was moderately reduced. - Right atrium: The atrium was moderately dilated. - Tricuspid valve: Moderate regurgitation. - Pulmonary arteries: Systolic pressure was moderately increased. PA peak pressure: 53mm Hg (S).     Venous duplex lower extremities  - Findings consistent with acute deep vein thrombosis involving the distal femoral and popliteal veins of  the left lower extremity. - No evidence of Baker's cyst on the right or left.     Ct Angio Chest W/cm &/or Wo Cm  06/24/2013   CLINICAL DATA:  Shortness of Breath  EXAM: CT ANGIOGRAPHY CHEST WITH CONTRAST  TECHNIQUE: Multidetector CT imaging of the chest was performed using the standard protocol during bolus administration of intravenous contrast. Multiplanar CT image reconstructions including MIPs were obtained to evaluate the vascular anatomy.  CONTRAST:  80mL OMNIPAQUE IOHEXOL 350 MG/ML SOLN  COMPARISON:  Chest radiograph June 24, 2013  FINDINGS: There is extensive pulmonary embolus arising from both main pulmonary arteries and extending into both lower lobe pulmonary artery systems, somewhat more on the right than on the left. There are also pulmonary emboli extending into the proximal right upper lobe pulmonary arteries.  There is enlargement of the right ventricle compared to the left ventricle with a right ventricle to left ventricular wall ratio of 1.7, a finding that is felt to be indicative of right ventricular strain.  There is atherosclerotic change in the aorta. There is no thoracic aortic aneurysm or apparent dissection.  There is patchy atelectatic change in the right middle and lower lobes. There is no frank edema or consolidation. No appreciable effusions.  There is no appreciable thoracic adenopathy. There is no appreciable pericardial effusion.  Visualized upper abdominal structures appear unremarkable except for atherosclerotic change in the aorta. There is degenerative change in the thoracic spine. There are no blastic or lytic bone lesions. There is thoracic dextroscoliosis. Thyroid appears normal.  Review of the MIP images confirms the above findings.  IMPRESSION: Extensive pulmonary embolus with evidence of right ventricular  strain as described above. Note that the pulmonary emboli involve significant portions of both right and left main pulmonary arteries. No edema or  consolidation.  Given the right ventricular strain and volume of pulmonary embolus, pulmonary/intensivist consultation as well as potential interventional radiology consultation may be reasonable.  Critical Value/emergent results were called by telephone at the time of interpretation on 06/24/2013 at 4:37 PM to Dr.Kohut, who verbally acknowledged these results.   Electronically Signed   By: Bretta Bang M.D.   On: 06/24/2013 16:39   Dg Chest Port 1 View  06/24/2013   CLINICAL DATA:  Shortness of breath.  Low-grade fever.  EXAM: PORTABLE CHEST - 1 VIEW  COMPARISON:  05/02/2010 and 11/15/2007 chest radiographs  FINDINGS: The cardiac silhouette remains mildly enlarged, unchanged. Calcification and likely surgical clips project over the aortic arch, unchanged. Left lung volume loss is unchanged with persistent left basilar opacity, which may reflect chronic atelectasis and elevation of the left hemidiaphragm although a small amount of pleural fluid is not excluded. The right lung remains clear without evidence of right-sided pleural effusion. There is no evidence of pneumothorax. Moderate dextroscoliosis centered in the lower thoracic spine is stable to slightly increased from 05/02/2010 and mildly increased from 2009. Sclerotic lesion is again partially visualized in the right humeral head, incompletely evaluated.  IMPRESSION: Unchanged appearance of left lung volume loss without evidence of acute airspace disease. Mild progression of thoracic scoliosis.   Electronically Signed   By: Sebastian Ache   On: 06/24/2013 12:20    Micro Results      Recent Results (from the past 240 hour(s))  URINE CULTURE     Status: None   Collection Time    06/24/13  2:59 PM      Result Value Range Status   Specimen Description URINE, CATHETERIZED   Final   Special Requests NONE   Final   Culture  Setup Time     Final   Value: 06/24/2013 16:17     Performed at [facility] Count     Final   Value:  >=100,000 COLONIES/ML     Performed at [facility]   Culture     Final   Value: ESCHERICHIA COLI     Performed at [facility]   Report Status 06/26/2013 FINAL   Final   Organism ID, Bacteria ESCHERICHIA COLI   Final  MRSA PCR SCREENING     Status: None   Collection Time    06/25/13 12:42 AM      Result Value Range Status   MRSA by PCR NEGATIVE  NEGATIVE Final   Comment:            The GeneXpert MRSA Assay (FDA     approved for NASAL specimens     only), is one component of a     comprehensive MRSA colonization     surveillance program. It is not     intended to diagnose MRSA     infection nor to guide or     monitor treatment for     MRSA infections.     History of present illness and  Hospital Course:      Kindly see H&P for history of present illness and admission details, please review complete Labs, Consult reports and Test reports for all details in brief Elizabeth Drake, is a 72 y.o. female, patient with history of  DM-2 in poor control, Sarcoidosis on Prednisone, R.3rd toe ulcer-cellulitis,  osteoporosis, hiatal hernia, was admitted to the hospital with chief complaints of shortness of breath, during her workup was found to have bilateral large PEs along with DVT in the left leg. She was also found to be in DKA in the [facility].     Bilateral PE with left leg DVT. She had significant right heart strain on CT scan and echogram, she did not get TPA and was seen by pulmonary in the [facility]. She was initially kept on IV heparin and then transitioned to xaralto on 06/25/2013. She is now symptom-free with no shortness of breath and no oxygen need. She's eager to go home, she refuses PT will be discharged home on xaralto. Will request primary care physician to kindly monitor and change her to dose which is due to be changed on 07/16/2013.    She did have short bursts of SVT secondary to stress from PE without any symptoms for which I have placed him on low-dose beta  blocker.    DM type II was in DKA upon admission. A1c was 8, I have adjusted her Amaryl and Glucophage dose, we'll request PCP to continue monitoring her glycemic control closely. Have advised patient to check q. a.c. at bedtime Accu-Cheks maintain a log book and bring it to PCP next visit in 3 days.     History of sarcoidosis on chronic prednisone, sarcoidosis is at baseline without any acute issues. 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     Right heart toe cellulitis with small ulcer. Both are healing well, she also had UTI this admission. Based on her culture sensitivity we'll place her on Ceftin for 5 more days.    Urinary retention. Foley placed on on Flomax, give 6 day trial and remove Foley. If retention happens again urology followup.      Today   Subjective:   Elizabeth Drake today has no headache,no chest abdominal pain,no new weakness tingling or numbness, feels much better wants to go home today.    Objective:   Blood pressure 153/82, pulse 86, temperature 98 F (36.7 C), temperature source Oral, resp. rate 18, height 5\' 6"  (1.676 m), weight 54.1 kg (119 lb 4.3 oz), SpO2 94.00%.   Intake/Output Summary (Last 24 hours) at 06/30/13 1121 Last data filed at 06/30/13 1053  Gross per 24 hour  Intake    600 ml  Output    800 ml  Net   -200 ml    Exam Awake Alert, Oriented *3, No new F.N deficits, Normal affect [place].AT,PERRAL Supple Neck,No JVD, No cervical lymphadenopathy appriciated.  Symmetrical Chest wall movement, Good air movement bilaterally, CTAB RRR,No Gallops,Rubs or new Murmurs, No Parasternal Heave +ve B.Sounds, Abd Soft, Non tender, No organomegaly appriciated, No rebound -guarding or rigidity. No Cyanosis, Clubbing or edema, No new Rash or bruise, R. 3rd toe has small healing ulcer and mild cellulitis   Data Review   CBC w Diff: Lab Results  Component Value Date   WBC 13.8* 06/30/2013   WBC  13.1* 04/24/2013   HGB 13.3 06/30/2013   HGB 13.4 04/24/2013   HCT 39.6 06/30/2013   HCT 38.6 04/24/2013   PLT 189 06/30/2013   LYMPHOPCT 8* 06/24/2013   MONOPCT 2* 06/24/2013   EOSPCT 0 06/24/2013   BASOPCT 0 06/24/2013    CMP: Lab Results  Component Value Date   NA 139 06/28/2013   NA 138 04/24/2013   K 4.2 06/28/2013   CL 108 06/28/2013   CO2  26 06/28/2013   BUN 17 06/28/2013   BUN 15 04/24/2013   CREATININE 0.58 06/28/2013   CREATININE 0.62 12/31/2012   PROT 6.0 04/26/2013   PROT 6.5 04/24/2013   ALBUMIN 3.0* 04/26/2013   BILITOT 0.6 04/26/2013   ALKPHOS 40 04/26/2013   AST 13 04/26/2013   ALT 9 04/26/2013  .   Total Time in preparing paper work, data evaluation and todays exam - 35 minutes  Leroy Sea M.D on 06/30/2013 at 11:21 AM  [facility] Office  [phone]

## 2013-06-29 NOTE — Progress Notes (Signed)
Clinical Social Work Department CLINICAL SOCIAL WORK PLACEMENT NOTE 06/29/2013  Patient:  SHAYONNA, OCAMPO  Account Number:  [id number] Admit date:  06/24/2013  Clinical Social Worker:  Carren Rang  Date/time:  06/29/2013 03:20 PM  Clinical Social Work is seeking post-discharge placement for this patient at the following level of care:   SKILLED NURSING   (*CSW will update this form in Epic as items are completed)   06/29/2013  Patient/family provided with [facility] Department of Clinical Social Work's list of facilities offering this level of care within the geographic area requested by the patient (or if unable, by the patient's family).  06/29/2013  Patient/family informed of their freedom to choose among providers that offer the needed level of care, that participate in Medicare, Medicaid or managed care program needed by the patient, have an available bed and are willing to accept the patient.  06/29/2013  Patient/family informed of [facility]' ownership interest in [facility], as well as of the fact that they are under no obligation to receive care at this facility.  PASARR submitted to EDS on 06/29/2013 PASARR number received from [employer] on 06/29/2013  FL2 transmitted to all facilities in geographic area requested by pt/family on  06/29/2013 FL2 transmitted to all facilities within larger geographic area on   Patient informed that his/her managed care company has contracts with or will negotiate with  certain facilities, including the following:     Patient/family informed of bed offers received:   Patient chooses bed at  Physician recommends and patient chooses bed at    Patient to be transferred to  on   Patient to be transferred to facility by   The following physician request were entered in Epic:   Additional Comments:  Maree Krabbe, MSW, [employer] [phone]

## 2013-06-29 NOTE — Progress Notes (Signed)
Clinical Social Work Department BRIEF PSYCHOSOCIAL ASSESSMENT 06/29/2013  Patient:  Elizabeth Drake, Elizabeth Drake     Account Number:  [id number]     Admit date:  06/24/2013  Clinical Social Worker:  Carren Rang  Date/Time:  06/29/2013 03:17 PM  Referred by:  Care Management  Date Referred:  06/29/2013 Referred for  SNF Placement   Other Referral:   Interview type:  Patient Other interview type:    PSYCHOSOCIAL DATA Living Status:  HUSBAND Admitted from facility:   Level of care:   Primary support name:  Elizabeth Drake Primary support relationship to patient:  SPOUSE Degree of support available:   Good    CURRENT CONCERNS Current Concerns  Post-Acute Placement   Other Concerns:    SOCIAL WORK ASSESSMENT / PLAN CSW received consult that patient is needing rehab at discharge. CSW reviewed chart and noticed PT recommending CIR. CSW went into room and introduced self and explained reason for visit. CSW explained the SNF process and explained this is a backup to CIR. Patient was agreeable to a backup SNF and prefers [facility]. CSW will complete FL2 for MD signature and will update patient when bed offers arise.   Assessment/plan status:  Psychosocial Support/Ongoing Assessment of Needs Other assessment/ plan:   Information/referral to community resources:   SNF information    PATIENT'S/FAMILY'S RESPONSE TO PLAN OF CARE: Patient is agreeable to SNF, but really prefers CIR.       Elizabeth [facility], MSW, [facility]ors [phone]

## 2013-06-30 ENCOUNTER — Inpatient Hospital Stay (HOSPITAL_COMMUNITY)
Admission: RE | Admit: 2013-06-30 | Discharge: 2013-07-18 | DRG: 945 | Disposition: A | Discharge status: Home Health | Payer: Medicare Other | Source: Intra-hospital | Attending: Physical Medicine & Rehabilitation | Admitting: Physical Medicine & Rehabilitation

## 2013-06-30 DIAGNOSIS — R29898 Other symptoms and signs involving the musculoskeletal system: Secondary | ICD-10-CM | POA: Diagnosis present

## 2013-06-30 DIAGNOSIS — R339 Retention of urine, unspecified: Secondary | ICD-10-CM | POA: Diagnosis present

## 2013-06-30 DIAGNOSIS — D869 Sarcoidosis, unspecified: Secondary | ICD-10-CM | POA: Diagnosis not present

## 2013-06-30 DIAGNOSIS — E1142 Type 2 diabetes mellitus with diabetic polyneuropathy: Secondary | ICD-10-CM | POA: Diagnosis present

## 2013-06-30 DIAGNOSIS — Z86711 Personal history of pulmonary embolism: Secondary | ICD-10-CM

## 2013-06-30 DIAGNOSIS — I824Y9 Acute embolism and thrombosis of unspecified deep veins of unspecified proximal lower extremity: Secondary | ICD-10-CM | POA: Diagnosis present

## 2013-06-30 DIAGNOSIS — R5381 Other malaise: Secondary | ICD-10-CM | POA: Diagnosis not present

## 2013-06-30 DIAGNOSIS — A498 Other bacterial infections of unspecified site: Secondary | ICD-10-CM | POA: Diagnosis present

## 2013-06-30 DIAGNOSIS — Z5189 Encounter for other specified aftercare: Secondary | ICD-10-CM | POA: Diagnosis not present

## 2013-06-30 DIAGNOSIS — I2699 Other pulmonary embolism without acute cor pulmonale: Secondary | ICD-10-CM | POA: Diagnosis present

## 2013-06-30 DIAGNOSIS — Z993 Dependence on wheelchair: Secondary | ICD-10-CM

## 2013-06-30 DIAGNOSIS — G8220 Paraplegia, unspecified: Secondary | ICD-10-CM | POA: Diagnosis present

## 2013-06-30 DIAGNOSIS — N390 Urinary tract infection, site not specified: Secondary | ICD-10-CM | POA: Diagnosis present

## 2013-06-30 DIAGNOSIS — I10 Essential (primary) hypertension: Secondary | ICD-10-CM | POA: Diagnosis present

## 2013-06-30 DIAGNOSIS — Z86718 Personal history of other venous thrombosis and embolism: Secondary | ICD-10-CM | POA: Diagnosis not present

## 2013-06-30 DIAGNOSIS — Z87891 Personal history of nicotine dependence: Secondary | ICD-10-CM | POA: Diagnosis not present

## 2013-06-30 DIAGNOSIS — K5900 Constipation, unspecified: Secondary | ICD-10-CM | POA: Diagnosis present

## 2013-06-30 DIAGNOSIS — M810 Age-related osteoporosis without current pathological fracture: Secondary | ICD-10-CM | POA: Diagnosis present

## 2013-06-30 DIAGNOSIS — IMO0002 Reserved for concepts with insufficient information to code with codable children: Secondary | ICD-10-CM

## 2013-06-30 DIAGNOSIS — E1149 Type 2 diabetes mellitus with other diabetic neurological complication: Secondary | ICD-10-CM | POA: Diagnosis present

## 2013-06-30 LAB — GLUCOSE, CAPILLARY
Glucose-Capillary: 182 mg/dL — ABNORMAL HIGH (ref 70–99)
Glucose-Capillary: 145 mg/dL — ABNORMAL HIGH (ref 70–99)

## 2013-06-30 LAB — CBC
MCH: 33.1 pg (ref 26.0–34.0)
Platelets: 189 10*3/uL (ref 150–400)
RBC: 4.02 MIL/uL (ref 3.87–5.11)
RDW: 12.4 % (ref 11.5–15.5)
WBC: 13.8 10*3/uL — ABNORMAL HIGH (ref 4.0–10.5)

## 2013-06-30 MED ORDER — PREDNISONE 10 MG PO TABS
10.0000 mg | ORAL_TABLET | Freq: Every day | ORAL | Status: DC
  Administered 2013-07-01 – 2013-07-18 (×18): 10 mg via ORAL
  Filled 2013-06-30 (×20): qty 1

## 2013-06-30 MED ORDER — HYDROCODONE-ACETAMINOPHEN 5-325 MG PO TABS
1.0000 | ORAL_TABLET | ORAL | Status: DC | PRN
  Administered 2013-07-01 (×2): 1 via ORAL
  Administered 2013-07-02 – 2013-07-04 (×9): 2 via ORAL
  Administered 2013-07-05 (×4): 1 via ORAL
  Administered 2013-07-05 (×2): 2 via ORAL
  Administered 2013-07-06: 1 via ORAL
  Administered 2013-07-06 – 2013-07-08 (×7): 2 via ORAL
  Administered 2013-07-08: 1 via ORAL
  Administered 2013-07-08 (×2): 2 via ORAL
  Administered 2013-07-08: 1 via ORAL
  Administered 2013-07-09 – 2013-07-18 (×37): 2 via ORAL
  Filled 2013-06-30 (×6): qty 2
  Filled 2013-06-30: qty 1
  Filled 2013-06-30 (×14): qty 2
  Filled 2013-06-30: qty 1
  Filled 2013-06-30 (×9): qty 2
  Filled 2013-06-30: qty 1
  Filled 2013-06-30 (×3): qty 2
  Filled 2013-06-30 (×2): qty 1
  Filled 2013-06-30 (×4): qty 2
  Filled 2013-06-30: qty 1
  Filled 2013-06-30 (×4): qty 2
  Filled 2013-06-30: qty 1
  Filled 2013-06-30 (×4): qty 2
  Filled 2013-06-30: qty 1
  Filled 2013-06-30 (×4): qty 2
  Filled 2013-06-30: qty 1
  Filled 2013-06-30: qty 2
  Filled 2013-06-30: qty 1
  Filled 2013-06-30 (×7): qty 2
  Filled 2013-06-30: qty 1
  Filled 2013-06-30 (×2): qty 2

## 2013-06-30 MED ORDER — CALCIUM CARBONATE-VITAMIN D 500-200 MG-UNIT PO TABS
1.0000 | ORAL_TABLET | Freq: Every day | ORAL | Status: DC
  Administered 2013-07-01 – 2013-07-18 (×16): 1 via ORAL
  Filled 2013-06-30 (×19): qty 1

## 2013-06-30 MED ORDER — ONDANSETRON HCL 4 MG PO TABS: 4.0000 mg | ORAL_TABLET | Freq: Four times a day (QID) | ORAL | Status: DC | PRN

## 2013-06-30 MED ORDER — GLIMEPIRIDE 4 MG PO TABS
4.0000 mg | ORAL_TABLET | Freq: Every day | ORAL | Status: DC
  Administered 2013-07-01 – 2013-07-06 (×6): 4 mg via ORAL
  Filled 2013-06-30 (×8): qty 1

## 2013-06-30 MED ORDER — DARIFENACIN HYDROBROMIDE ER 15 MG PO TB24
15.0000 mg | ORAL_TABLET | Freq: Every day | ORAL | Status: DC
  Administered 2013-07-01 – 2013-07-04 (×4): 15 mg via ORAL
  Filled 2013-06-30 (×5): qty 1

## 2013-06-30 MED ORDER — TAMSULOSIN HCL 0.4 MG PO CAPS: 0.4000 mg | ORAL_CAPSULE | Freq: Every day | ORAL | Status: DC

## 2013-06-30 MED ORDER — METOPROLOL TARTRATE 25 MG PO TABS
25.0000 mg | ORAL_TABLET | Freq: Two times a day (BID) | ORAL | Status: DC
  Administered 2013-07-01 – 2013-07-18 (×34): 25 mg via ORAL
  Filled 2013-06-30 (×40): qty 1

## 2013-06-30 MED ORDER — POLYETHYLENE GLYCOL 3350 17 G PO PACK
17.0000 g | PACK | Freq: Every day | ORAL | Status: DC | PRN
  Administered 2013-07-01 – 2013-07-05 (×2): 17 g via ORAL
  Filled 2013-06-30: qty 1

## 2013-06-30 MED ORDER — TAMSULOSIN HCL 0.4 MG PO CAPS
0.4000 mg | ORAL_CAPSULE | Freq: Every day | ORAL | Status: DC
  Administered 2013-06-30: 0.4 mg via ORAL
  Filled 2013-06-30: qty 1

## 2013-06-30 MED ORDER — ONDANSETRON HCL 4 MG/2ML IJ SOLN: 4.0000 mg | Freq: Four times a day (QID) | INTRAMUSCULAR | Status: DC | PRN

## 2013-06-30 MED ORDER — ALBUTEROL SULFATE (5 MG/ML) 0.5% IN NEBU: 2.5000 mg | INHALATION_SOLUTION | RESPIRATORY_TRACT | Status: DC | PRN

## 2013-06-30 MED ORDER — RIVAROXABAN 15 MG PO TABS
15.0000 mg | ORAL_TABLET | Freq: Two times a day (BID) | ORAL | Status: DC
  Administered 2013-07-01: 15 mg via ORAL
  Filled 2013-06-30 (×3): qty 1

## 2013-06-30 MED ORDER — INSULIN ASPART 100 UNIT/ML ~~LOC~~ SOLN
0.0000 [IU] | Freq: Every day | SUBCUTANEOUS | Status: DC
  Administered 2013-07-01: 2 [IU] via SUBCUTANEOUS
  Administered 2013-07-02 – 2013-07-06 (×3): 3 [IU] via SUBCUTANEOUS
  Administered 2013-07-07 – 2013-07-12 (×4): 2 [IU] via SUBCUTANEOUS

## 2013-06-30 MED ORDER — ADULT MULTIVITAMIN W/MINERALS CH
1.0000 | ORAL_TABLET | Freq: Every day | ORAL | Status: DC
  Administered 2013-07-01 – 2013-07-18 (×17): 1 via ORAL
  Filled 2013-06-30 (×19): qty 1

## 2013-06-30 MED ORDER — DEXTROSE 5 % IV SOLN
1.0000 g | INTRAVENOUS | Status: DC
  Administered 2013-07-01: 1 g via INTRAVENOUS
  Filled 2013-06-30: qty 10

## 2013-06-30 MED ORDER — RIVAROXABAN 20 MG PO TABS: 20.0000 mg | ORAL_TABLET | Freq: Every day | ORAL | Status: DC

## 2013-06-30 MED ORDER — ACETAMINOPHEN 325 MG PO TABS
325.0000 mg | ORAL_TABLET | ORAL | Status: DC | PRN
  Administered 2013-06-30 – 2013-07-02 (×3): 650 mg via ORAL
  Filled 2013-06-30 (×3): qty 2

## 2013-06-30 MED ORDER — SORBITOL 70 % SOLN
30.0000 mL | Freq: Every day | Status: DC | PRN
  Administered 2013-07-02: 30 mL via ORAL
  Filled 2013-06-30: qty 30

## 2013-06-30 MED ORDER — TAMSULOSIN HCL 0.4 MG PO CAPS
0.4000 mg | ORAL_CAPSULE | Freq: Every day | ORAL | Status: DC
  Administered 2013-07-01 – 2013-07-18 (×18): 0.4 mg via ORAL
  Filled 2013-06-30 (×19): qty 1

## 2013-06-30 NOTE — Progress Notes (Signed)
Report called to [facility] rehab at this time.

## 2013-06-30 NOTE — PMR Pre-admission (Signed)
PMR Admission Coordinator Pre-Admission Assessment  Patient: Elizabeth Drake is an 72 y.o., female MRN: [id number] DOB: 03-Apr-1941 Height: 5\' 6"  (167.6 cm) Weight: 54.1 kg (119 lb 4.3 oz)              Insurance Information HMO:     PPO:      PCP:      IPA:      80/20: yes     OTHER: no HMO PRIMARY: medicare a and B      Policy#: [id number]      Subscriber: pt Benefits:  Phone #: [email or web address]     Name: 06/29/13 Eff. 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 Patient recently treated for third toe cellulitis right foot. Elizabeth Drake is wheelchair-bound prior to admission independent with transfers no ambulation. Admitted 06/24/2013 with shortness of breath and chest heaviness that worsened with minimal exertion. Troponin and cardiac enzymes negative. CT angiogram chest showed extensive pulmonary embolus with evidence of right ventricular strain. Noted pulmonary emboli involve significant portions of both right and left main pulmonary arteries. Patient placed on intravenous heparin and transitioned to Xarelto. Venous Doppler studies lower extremities 06/25/2013 with findings consistent of acute DVT distal femoral-popliteal vein of left lower extremity. Urine culture greater than 100,000 Escherichia coli  and maintained on Rocephin. Physical therapy evaluation completed 06/29/2013 with recommendations for physical medicine rehabilitation consult to consider inpatient rehabilitation services.  Patient has had severe weakness of the right > Left lower extremity since diagnosis of sarcoidosis around her spinal cord any years ago   Past Medical History  Past Medical History  Diagnosis Date  . Sarcoidosis   . Hiatal hernia   . Diabetes mellitus without complication   . Osteoporosis     Family History  family history includes Cancer in her father; Cancer - Other in her sister; Heart disease in her mother; Hypertension in her mother.  Prior Rehab/Hospitalizations: SNF 2011 for about 30 days  Current Medications  Current facility-administered medications:albuterol (PROVENTIL) (5 MG/ML) 0.5% nebulizer solution 2.5 mg, 2.5 mg, Nebulization, Q2H PRN, Leroy Sea, MD;  calcium-vitamin D (OSCAL WITH D) 500-200 MG-UNIT per tablet 1 tablet, 1 tablet, Oral, Q breakfast, Leroy Sea, MD, 1 tablet at 06/30/13 0610;  cefTRIAXone (ROCEPHIN) 1 g in dextrose 5 % 50 mL IVPB, 1 g, Intravenous, Q24H, Saima Rizwan, MD, 1 g at 06/29/13 1707 darifenacin (ENABLEX) 24 hr tablet 15 mg, 15 mg, Oral, Daily, Leroy Sea, MD, 15 mg at 06/30/13 1051;  dextrose 50 % solution 25 mL, 25 mL, Intravenous, PRN, Leroy Sea, MD;  diphenhydrAMINE (BENADRYL) tablet 25 mg, 25 mg, Oral, Daily PRN,  Leroy Sea, MD;  glimepiride (AMARYL) tablet 4 mg, 4 mg, Oral, Q breakfast, Calvert Cantor, MD, 4 mg at 06/30/13 0610 guaiFENesin-dextromethorphan (ROBITUSSIN DM) 100-10 MG/5ML syrup 5 mL, 5 mL, Oral, Q4H PRN, Leroy Sea, MD;  HYDROcodone-acetaminophen (NORCO/VICODIN) 5-325 MG per tablet 1-2 tablet, 1-2 tablet, Oral, Q4H PRN, Leroy Sea, MD, 1 tablet at 06/30/13 0237;  insulin aspart (novoLOG) injection 0-5 Units, 0-5 Units, Subcutaneous, QHS, Saima Rizwan, MD insulin aspart (novoLOG) injection 0-9 Units, 0-9  Units, Subcutaneous, TID WC, Calvert Cantor, MD, 1 Units at 06/30/13 [phone];  metoprolol tartrate (LOPRESSOR) tablet 25 mg, 25 mg, Oral, BID, Leroy Sea, MD, 25 mg at 06/30/13 1051;  multivitamin with minerals tablet 1 tablet, 1 tablet, Oral, Daily, Leroy Sea, MD, 1 tablet at 06/30/13 1051 mupirocin ointment (BACTROBAN) 2 % 1 application, 1 application, Nasal, BID, Leroy Sea, MD, 1 application at 06/30/13 0824;  ondansetron (ZOFRAN) tablet 8 mg, 8 mg, Oral, Q8H PRN, Leroy Sea, MD, 8 mg at 06/26/13 0622;  polyethylene glycol (MIRALAX / GLYCOLAX) packet 17 g, 17 g, Oral, Daily PRN, Leroy Sea, MD;  predniSONE (DELTASONE) tablet 10 mg, 10 mg, Oral, Q breakfast, Calvert Cantor, MD, 10 mg at 06/30/13 1308 Rivaroxaban (XARELTO) tablet 15 mg, 15 mg, Oral, BID WC, Calvert Cantor, MD, 15 mg at 06/30/13 0824;  [START ON 07/16/2013] Rivaroxaban (XARELTO) tablet 20 mg, 20 mg, Oral, Q supper, Calvert Cantor, MD;  sodium chloride 0.9 % injection 10-40 mL, 10-40 mL, Intracatheter, Q12H, Calvert Cantor, MD, 12 mL at 06/29/13 2118;  sodium chloride 0.9 % injection 10-40 mL, 10-40 mL, Intracatheter, PRN, Calvert Cantor, MD, 10 mL at 06/30/13 0527 sodium chloride 0.9 % injection 3 mL, 3 mL, Intravenous, Q12H, Leroy Sea, MD, 3 mL at 06/28/13 2143;  tamsulosin (FLOMAX) capsule 0.4 mg, 0.4 mg, Oral, Daily, Leroy Sea, MD  Patients Current Diet: Carb Control  Precautions / Restrictions Precautions Precautions: Fall Restrictions Weight Bearing Restrictions: No   Prior Activity Level Community (5-7x/wk): usually in the community weekly Patient was Mod I at w/c level pta.  STand pivot transfer independently.  Home Assistive Devices / Equipment Home Assistive Devices/Equipment: Bedside commode/3-in-1;Grab bars around toilet;Shower chair without back  Prior Functional Level Prior Function Level of Independence: Independent with assistive device(s) Comments: modified independent at  wheelchair transfer level  Current Functional Level Cognition  Overall Cognitive Status: Within Functional Limits for tasks assessed Orientation Level: Oriented X4    Extremity Assessment (includes Sensation/Coordination)          ADLs  Eating/Feeding: Performed;Modified independent Where Assessed - Eating/Feeding: Bed level Grooming: Performed;Wash/dry hands;Wash/dry face;Modified independent;Set up Where Assessed - Grooming: Supine, head of bed up Upper Body Bathing: Simulated;Set up;Min guard Where Assessed - Upper Body Bathing: Supine, head of bed up Lower Body Bathing: Simulated;Moderate assistance Where Assessed - Lower Body Bathing: Supine, head of bed flat;Rolling right and/or left Upper Body Dressing: Performed;Minimal assistance (Don gown sitting on 3:1) Where Assessed - Upper Body Dressing: Supported sitting Lower Body Dressing: Performed;+2 Total assistance Lower Body Dressing: Patient Percentage: 30% Where Assessed - Lower Body Dressing: Other (comment) (During SPT from 3:1 to bed, pt very weak.) Toilet Transfer: Performed;+2 Total assistance (EOB to 3:1) Toilet Transfer: Patient Percentage: 50% Toilet Transfer Method: Surveyor, minerals: Bedside commode;Other (comment) (Recommend drop arm 3:1 in pt room) Toileting - Clothing Manipulation and Hygiene: Performed;+2 Total assistance Toileting - Clothing Manipulation and Hygiene: Patient Percentage: 10% Where Assessed -  Toileting Clothing Manipulation and Hygiene: Sit on 3-in-1 or toilet;Lean right and/or left Tub/Shower Transfer Method: Not assessed Equipment Used: Gait belt;Other (comment) (3:1 bedside) Transfers/Ambulation Related to ADLs: Pt performed SPT x2 from EOB to 3:1 & back given +2 total assist (recommend drop arm 3:1 on pt room), note pt is typically Mod I with this type of transfer. ADL Comments: Pt was educated in role of OT and participated in ADL retraining this am. Pt was educated in  OT recommendations of in-pt rehab stay to asist in maximizing independence to PLOF prior to returning home, pt verbalized agreement with this.    Mobility  Bed Mobility: Rolling Right;Right Sidelying to Sit;Sitting - Scoot to Edge of Bed Rolling Right: 6: Modified independent (Device/Increase time) Right Sidelying to Sit: 3: Mod assist;With rails;HOB flat Sitting - Scoot to Edge of Bed: 3: Mod assist;With rail    Transfers  Transfers: Squat Pivot Transfers Sit to Stand: 1: +2 Total assist Squat Pivot Transfers: 2: Max assist;With upper extremity assistance;With armrests    Ambulation / Gait / Stairs / Agricultural engineer: No    Posture / Games developer Sitting - Balance Support: Bilateral upper extremity supported;Feet supported Static Sitting - Level of Assistance: 4: Min assist    Special needs/care consideration Bowel mgmt:  continent Bladder mgmt: wears depends at home. In hospital with urinary retention and I and O cath and foley    Previous Home Environment Living Arrangements: Spouse/significant other;Other (Comment) (spouse and son in the home)  Lives With: Spouse;Son Available Help at Discharge: Available 24 hours/day;Family Type of Home: House Home Layout: One level Home Access: Ramped entrance Bathroom Shower/Tub: Tub/shower unit;Other (comment) (has bars on wall going into bathroom and bars in tub) Bathroom Toilet: Standard Bathroom Accessibility: No (uses bars on walls to leave w/c and walk into bathroom about) Home Care Services: No Additional Comments: son lives with them ~ 1/2 the time  Discharge Living Setting Plans for Discharge Living Setting: Patient's home;Lives with (comment);Other (Comment) Type of Home at Discharge: House Discharge Home Layout: One level Discharge Home Access: Ramped entrance Discharge Bathroom Shower/Tub: Tub/shower unit Discharge Bathroom Toilet: Standard Discharge  Bathroom Accessibility: No (walks from door into bathroom using grab bars 2 steps only) Does the patient have any problems obtaining your medications?: No  Social/Family/Support Systems Patient Roles: Spouse;Parent Contact Information: Germani Gavilanes, spouse Anticipated Caregiver: spouse Anticipated Caregiver's Contact Information: see above Ability/Limitations of Caregiver: supervision to min assist Caregiver Availability: 24/7 Discharge Plan Discussed with Primary Caregiver: Yes Is Caregiver In Agreement with Plan?: Yes Does Caregiver/Family have Issues with Lodging/Transportation while Pt is in Rehab?: No    Goals/Additional Needs Patient/Family Goal for Rehab: Mod I w/c level with PT and OT Expected length of stay: ELOS 12 to 14 days Pt/Family Agrees to Admission and willing to participate: Yes Program Orientation Provided & Reviewed with Pt/Caregiver Including Roles  & Responsibilities: Yes   Decrease burden of Care through IP rehab admission: n/a  Possible need for SNF placement upon discharge: Prefers CIR, but if intensity of therapy proves to be beyind her capability, we discussed SNF placement after she tries rehab venue for a period of time   Patient Condition: This patient's condition remains as documented in the consult dated 06/30/13, in which the Rehabilitation Physician determined and documented that the patient's condition is appropriate for intensive rehabilitative care in an inpatient rehabilitation facility. Will admit to inpatient rehab today.  Preadmission Screen Completed By:  Clois Dupes, 06/30/2013 11:33 AM ______________________________________________________________________   Discussed status with Dr. Wynn Banker  On 06/30/13 at  1133 and received telephone approval for admission today.  Admission Coordinator:  Clois Dupes, time 0454 Date 06/30/13.

## 2013-06-30 NOTE — Evaluation (Signed)
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 She presented to the ER for dyspnea and chest pressure and was found to have b/l Pulmonary embolism and an occlusive DVT in the left popliteal vein.    Clinical Impression   Pt presents w/ dx as above now impacting her ability to perform ADL and self care tasks. Pt was Mod I w/c level for all ADL & functional transfers, now requiring +2 total assist for SPT/squat pivot transfers to 3:1. She will benefit from acute OT to assist with maximizing independence w/ w/c level ADL's prior to in-pt rehab. Recommend CIR.    OT Assessment  Patient needs continued OT Services    Follow Up Recommendations  CIR    Barriers to Discharge Decreased caregiver support    Equipment Recommendations  Other (comment) (Defer to CIR)    Recommendations for Other Services Rehab consult  Frequency  Min 2X/week    Precautions / Restrictions Precautions Precautions: Fall Restrictions Weight Bearing Restrictions: No   Pertinent Vitals/Pain 7/10 abdominal pain. RN aware, pt repositioned, urine retention, attempted emptying bladder on 3:1. RN to place in/out catheter.    ADL  Eating/Feeding: Performed;Modified independent Where Assessed - Eating/Feeding: Bed level Grooming: Performed;Wash/dry hands;Wash/dry face;Modified independent;Set up Where Assessed - Grooming: Supine, head of bed up Upper Body Bathing: Simulated;Set up;Min guard Where Assessed - Upper Body Bathing: Supine, head of bed up Lower Body Bathing: Simulated;Moderate assistance Where  Assessed - Lower Body Bathing: Supine, head of bed flat;Rolling right and/or left Upper Body Dressing: Performed;Minimal assistance (Don gown sitting on 3:1) Where Assessed - Upper Body Dressing: Supported sitting Lower Body Dressing: Performed;+2 Total assistance Lower Body Dressing: Patient Percentage: 30% Where Assessed - Lower Body Dressing: Other (comment) (During SPT from 3:1 to bed, pt very weak.) Toilet Transfer: Performed;+2 Total assistance (EOB to 3:1) Toilet Transfer: Patient Percentage: 50% Toilet Transfer Method: Surveyor, minerals: Bedside commode;Other (comment) (Recommend drop arm 3:1 in pt room) Toileting - Clothing Manipulation and Hygiene: Performed;+2 Total assistance Toileting - Clothing Manipulation and Hygiene: Patient Percentage: 10% Where Assessed - Toileting Clothing Manipulation and Hygiene: Sit on 3-in-1 or toilet;Lean right and/or left Tub/Shower Transfer Method: Not assessed Equipment Used: Gait belt;Other (comment) (3:1 bedside) Transfers/Ambulation Related to ADLs: Pt performed SPT x2 from EOB to 3:1 & back given +2 total assist (recommend drop arm 3:1 on pt room), note pt is typically Mod I with this type of transfer. ADL Comments: Pt was educated in role of OT and participated in ADL retraining this am. Pt was educated in OT recommendations of in-pt rehab stay to asist in maximizing independence to PLOF prior to returning home, pt verbalized agreement with this.    OT Diagnosis: Generalized weakness;Acute pain  OT Problem List: Decreased strength;Decreased activity tolerance;Impaired balance (sitting and/or standing);Pain;Impaired UE functional use;Decreased knowledge of use of DME or AE;Decreased knowledge of precautions OT Treatment Interventions: Self-care/ADL training;DME and/or AE instruction;Energy conservation;Therapeutic exercise;Therapeutic activities;Patient/family education;Balance training   OT Goals(Current goals can be found in  the care plan section) Acute Rehab OT Goals Patient Stated Goal: To be able to return at PLOF, mod I to w/c, commode/3:1, Mod  I ADL's OT Goal Formulation: With patient Time For Goal Achievement: 07/14/13 Potential to Achieve Goals: Good  Visit Information  Last OT Received On: 06/30/13 Assistance Needed: +2 History of Present Illness: 72 y/o female with 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 She presented to the ER for dyspnea and chest pressure and was found to have b/l Pulmonary embolism and an occlusive DVT in the left popliteal vein.        Prior Functioning     Home Living Family/patient expects to be discharged to:: Inpatient rehab Living Arrangements: Spouse/significant other;Children Additional Comments: son lives with them ~ 1/2 the time Prior Function Level of Independence: Independent with assistive device(s) Comments: modified independent at wheelchair transfer level Communication Communication: No difficulties Dominant Hand: Right    Vision/Perception Vision - History Baseline Vision: Wears glasses only for reading Visual History: Retinopathy Patient Visual Report: Other (comment) (Pt w/ recent c/o difficulty seeing. Seeing specialist)   Cognition  Cognition Arousal/Alertness: Awake/alert Behavior During Therapy: WFL for tasks assessed/performed Overall Cognitive Status: Within Functional Limits for tasks assessed    Extremity/Trunk Assessment Upper Extremity Assessment Upper Extremity Assessment: Generalized weakness Lower Extremity Assessment Lower Extremity Assessment: Defer to PT evaluation RLE Deficits / Details: AAROM WFL, hip flex 3, hip ext 2, knee extension 2+ RLE Sensation: history of peripheral neuropathy LLE Deficits / Details: AAROM WFL; hip flex 3/5, hip ext 2+, knee ext 3- LLE  Sensation: history of peripheral neuropathy Cervical / Trunk Assessment Cervical / Trunk Assessment: Kyphotic;Other exceptions Cervical / Trunk Exceptions: severe kyphosis with scoliosis; requires bil UE support to maintain sitting due to trunk weakness    Mobility Bed Mobility Bed Mobility: Rolling Right;Right Sidelying to Sit;Sitting - Scoot to [place] of Bed Rolling Right: 6: Modified independent (Device/Increase time) Right Sidelying to Sit: 3: Mod assist;With rails;HOB flat Sitting - Scoot to Edge of Bed: 3: Mod assist;With rail Details for Bed Mobility Assistance: pt very motivated and attempted all movements alone prior to assist provided (assist due to weakness) Transfers Transfers: Sit to Stand;Stand to Sit Sit to Stand: 1: +2 Total assist Details for Transfer Assistance: quad lift type squat-pivot due to extreme kyphosis; x1 to left (to BSC) x1 to Rt (back to bed per RN - pt awaiting in/out cath)       Balance Balance Balance Assessed: Yes Static Sitting Balance Static Sitting - Balance Support: Bilateral upper extremity supported;Feet supported Static Sitting - Level of Assistance: 4: Min assist   End of Session OT - End of Session Equipment Utilized During Treatment: Gait belt;Other (comment) (3:1) Activity Tolerance: Patient tolerated treatment well Patient left: in bed;with call bell/phone within reach;with bed alarm set Nurse Communication: Mobility status  GO     Alm Bustard 06/30/2013, 9:23 AM

## 2013-06-30 NOTE — Progress Notes (Signed)
MD notified of bladder scan; PT in room to see pt; pt going to attempt to void in [facility]; per MD, if pt unable to void place foley; if pt able to void bladder scan again and notify MD of bladder scan amount; will cont. To monitor.

## 2013-06-30 NOTE — Progress Notes (Signed)
I met with pt, her spouse, and daughter in law at bedside. Discussed the inpt rehab intensity of therapy and she is in agreement. I will make arrangements for admit today. [phone]

## 2013-06-30 NOTE — Progress Notes (Signed)
Pt incontinent of urine and voided 50ml urine in BSC; bladder scan still show >900 ml urine; will place foley; will cont. To monitor.

## 2013-06-30 NOTE — Progress Notes (Signed)
CSW reviewed chart and noticed plan is for CIR. CSW signing off at this time. Please re consult if social work needs arise.  G[place]ola Wheat, MSW, LCSWA [phone]  

## 2013-06-30 NOTE — Progress Notes (Signed)
Bladder scan results = ">999 ml".  °

## 2013-06-30 NOTE — Progress Notes (Signed)
Pt to be transferred to 4W23; room dirty at this time; will transfer pt when bed ready.

## 2013-06-30 NOTE — Progress Notes (Signed)
Foley placed at this time for urinary retention; foley insertion assisted by another RN; will cont. To monitor.

## 2013-06-30 NOTE — Progress Notes (Signed)
RN called rehab; pt still not able to be transferred; 4w23; still dirty room; will cont. To attempt to transfer pt.

## 2013-07-01 ENCOUNTER — Inpatient Hospital Stay (HOSPITAL_COMMUNITY): Payer: No Typology Code available for payment source

## 2013-07-01 ENCOUNTER — Encounter (HOSPITAL_COMMUNITY): Payer: Self-pay | Admitting: Neurology

## 2013-07-01 ENCOUNTER — Inpatient Hospital Stay (HOSPITAL_COMMUNITY): Payer: Medicare Other

## 2013-07-01 ENCOUNTER — Inpatient Hospital Stay (HOSPITAL_COMMUNITY): Payer: Medicare Other | Admitting: Occupational Therapy

## 2013-07-01 DIAGNOSIS — G8220 Paraplegia, unspecified: Secondary | ICD-10-CM

## 2013-07-01 DIAGNOSIS — R5381 Other malaise: Secondary | ICD-10-CM

## 2013-07-01 DIAGNOSIS — D869 Sarcoidosis, unspecified: Secondary | ICD-10-CM

## 2013-07-01 LAB — CBC WITH DIFFERENTIAL/PLATELET
Basophils Absolute: 0 10*3/uL (ref 0.0–0.1)
Basophils Relative: 0 % (ref 0–1)
Eosinophils Absolute: 0.1 10*3/uL (ref 0.0–0.7)
Eosinophils Relative: 1 % (ref 0–5)
HCT: 41.7 % (ref 36.0–46.0)
Hemoglobin: 13.9 g/dL (ref 12.0–15.0)
Lymphocytes Relative: 19 % (ref 12–46)
Lymphs Abs: 2 10*3/uL (ref 0.7–4.0)
MCH: 33.3 pg (ref 26.0–34.0)
MCV: 99.8 fL (ref 78.0–100.0)
Monocytes Absolute: 1 10*3/uL (ref 0.1–1.0)
Monocytes Relative: 9 % (ref 3–12)
RBC: 4.18 MIL/uL (ref 3.87–5.11)
RDW: 12.6 % (ref 11.5–15.5)
WBC: 10.7 10*3/uL — ABNORMAL HIGH (ref 4.0–10.5)

## 2013-07-01 LAB — COMPREHENSIVE METABOLIC PANEL
ALT: 77 U/L — ABNORMAL HIGH (ref 0–35)
AST: 19 U/L (ref 0–37)
BUN: 17 mg/dL (ref 6–23)
CO2: 34 mEq/L — ABNORMAL HIGH (ref 19–32)
Calcium: 9.8 mg/dL (ref 8.4–10.5)
GFR calc Af Amer: >90 mL/min (ref >90)
GFR calc non Af Amer: 84 mL/min — ABNORMAL LOW (ref >90)
Glucose, Bld: 132 mg/dL — ABNORMAL HIGH (ref 70–99)
Potassium: 3.5 mEq/L (ref 3.5–5.1)
Sodium: 142 mEq/L (ref 135–145)

## 2013-07-01 LAB — GLUCOSE, CAPILLARY
Glucose-Capillary: 140 mg/dL — ABNORMAL HIGH (ref 70–99)
Glucose-Capillary: 251 mg/dL — ABNORMAL HIGH (ref 70–99)
Glucose-Capillary: 213 mg/dL — ABNORMAL HIGH (ref 70–99)

## 2013-07-01 MED ORDER — RIVAROXABAN 15 MG PO TABS
15.0000 mg | ORAL_TABLET | Freq: Two times a day (BID) | ORAL | Status: AC
  Administered 2013-07-01 – 2013-07-17 (×33): 15 mg via ORAL
  Filled 2013-07-01 (×34): qty 1

## 2013-07-01 MED ORDER — RIVAROXABAN 20 MG PO TABS
20.0000 mg | ORAL_TABLET | Freq: Every day | ORAL | Status: DC
  Filled 2013-07-01 (×2): qty 1

## 2013-07-01 NOTE — Progress Notes (Signed)
Patient information reviewed and entered into eRehab system by Weylyn Ricciuti, RN, CRRN, PPS Coordinator.  Information including medical coding and functional independence measure will be reviewed and updated through discharge.     Per nursing patient was given "Data Collection Information Summary for Patients in Inpatient Rehabilitation Facilities with attached "Privacy Act Statement-Health Care Records" upon admission.  

## 2013-07-01 NOTE — Evaluation (Signed)
Occupational Therapy Assessment and Plan  Patient Details  Name: Elizabeth Drake MRN: [id number] Date of Birth: 1940-09-07  OT Diagnosis: deconditioning, abnormal posture, acute pain, cognitive deficits, lumbago (low back pain), muscle weakness (generalized) and premorbid paraparesis. Rehab Potential: Rehab Potential: Fair ELOS: 14 days   Today's Date: 07/01/2013 Time: 1100-1200 Time Calculation (min): 60 min  Problem List:  Patient Active Problem List   Diagnosis Date Noted  . Physical deconditioning 06/30/2013  . DKA, type 2 06/25/2013  . Pulmonary embolism 06/24/2013  . PE (pulmonary thromboembolism) 06/24/2013  . Hypoxemia 06/24/2013  . Tachycardia 06/24/2013  . Cellulitis of foot without toes, right 06/24/2013  . DM type 2 (diabetes mellitus, type 2) 04/25/2013  . OAB (overactive bladder) 04/25/2013  . Cellulitis of toe of left foot with ulcer 04/25/2013  . Leukocytosis, unspecified 04/25/2013  . 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 Wheelchair bound 04/25/2013  . UTI (lower urinary tract infection) 04/25/2013  . Osteoporosis, unspecified 04/25/2013  . Chronic back pain 04/25/2013  . Chronic leg pain 04/25/2013  . OA (osteoarthritis) 04/25/2013  . Hyperlipidemia 12/31/2012  . Sarcoidosis 12/31/2012    Past Medical History:  Past Medical History  Diagnosis Date  . Sarcoidosis   . Hiatal hernia   . Diabetes mellitus without complication   . Osteoporosis    Past Surgical History:  Past Surgical History  Procedure Laterality Date  . Abdominal hysterectomy    . Joint replacement      toe  . Lung lobectomy Left     Lower    Assessment & Plan Clinical Impression: Elizabeth Drake is a 72 y.o. 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 Patient recently treated for third toe cellulitis right foot. Mrs. Wainwright is wheelchair-bound prior to admission independent with transfers no ambulation. Admitted  06/24/2013 with shortness of breath and chest heaviness that worsened with minimal exertion. Troponin and cardiac enzymes negative. CT angiogram chest showed extensive pulmonary embolus with evidence of right ventricular strain. Noted pulmonary emboli involve significant portions of both right and left main pulmonary arteries. Patient placed on intravenous heparin and transitioned to Xarelto. Venous Doppler studies lower extremities 06/25/2013 with findings consistent of acute DVT distal femoral-popliteal vein of left lower extremity. Urine culture greater than 100,000 Escherichia coli and maintained on Rocephin.   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 Patient transferred to CIR on 06/30/2013 .    Patient currently requires max-total with basic self-care skills secondary to muscle weakness, decreased cardiorespiratoy endurance and decreased sitting balance, decreased postural control and decreased balance strategies.  Prior to hospitalization, patient reports she was Modified I except husband present when she go into the shower.  Patient will benefit from skilled intervention to decrease level of assist with basic self-care skills prior to discharge home with care partner.  Anticipate patient will require 24 hour supervision and minimal physical assistance and follow up home health.  OT - End of Session Activity Tolerance: Decreased this session;Tolerates 30+ min activity with multiple rests Endurance Deficit: Yes Endurance Deficit Description: Unable to fully participate in AM ADL session. OT Assessment Rehab Potential: Fair Barriers to Discharge: Decreased caregiver support OT Patient demonstrates impairments in the following area(s): Balance;Endurance;Motor;Safety;Pain OT Basic ADL's Functional Problem(s): Grooming;Bathing;Dressing;Toileting OT Transfers Functional Problem(s): Toilet;Tub/Shower OT  Plan OT Intensity: Minimum of 1-2 x/day, 45 to 90 minutes OT Frequency: 5 out of 7  days OT Duration/Estimated Length of Stay: 14 days OT Treatment/Interventions: Social worker;Discharge planning;Functional mobility training;Patient/family education;Self Care/advanced ADL retraining;UE/LE Strength taining/ROM;Therapeutic Activities;Wheelchair propulsion/positioning OT Basic Self-Care Anticipated Outcome(s): overall min assist OT Toileting Anticipated Outcome(s): overall min assist OT Bathroom Transfers Anticipated Outcome(s): min assist-tub bench OT Recommendation Patient destination: Home Follow Up Recommendations: Home health OT;24 hour supervision/assistance Equipment Recommended: To be determined (per patient- she has BSC and tub bench)  Skilled Therapeutic Intervention   OT Evaluation Precautions/Restrictions  Precautions Precautions: Fall Restrictions Weight Bearing Restrictions: No Pain Pain Assessment Pain Assessment: 0-10 Pain Score: 8  Pain Location: Back Pain Radiating Towards: legs Pain Onset: On-going Patients Stated Pain Goal: 4 Pain Intervention(s): Medication (See eMAR) Home Living/Prior Functioning Home Living Family/patient expects to be discharged to:: Private residence Living Arrangements: Spouse/significant other;Children Available Help at Discharge: Available 24 hours/day;Family Type of Home: House Home Access: Ramped entrance Home Layout: One level Additional Comments: son lives with them ~ 1/2 the time, has bars on wall and in tub/shower, w/c does not fit in bathroom,   Lives With: Spouse;Son (maybe sister Harriett Sine occasionally) Prior Function Level of Independence: Independent with homemaking with wheelchair;Independent with transfers;Requires assistive device for independence;Other (comment) (non ambulatory PTA)  Able to Take Stairs?: No Driving: No Comments: modified independent at wheelchair transfer  level ADL See FIM below Vision/Perception  Vision - History Baseline Vision: Wears glasses only for reading (pt reports seeing a vision specialist PTA) Visual History: Retinopathy  Cognition Safety/Judgment: Impaired Comments: Safety intact, however, not realistic at this time regarding ability to d/c at Mod(I) level Sensation Sensation Light Touch: Appears Intact Proprioception: Appears Intact Coordination Gross Motor Movements are Fluid and Coordinated: No Fine Motor Movements are Fluid and Coordinated: No Motor  Motor Motor: Abnormal postural alignment and control Motor - Skilled Clinical Observations: Significantly decreased strength in core and B LE>B UE w/ R side weaker than L side Mobility  Bed Mobility Bed Mobility: Rolling Right;Right Sidelying to Sit;Sit to Supine Rolling Right: 3: Mod assist Rolling Right Details: Verbal cues for sequencing Right Sidelying to Sit: 2: Max assist;HOB flat;With rails Right Sidelying to Sit Details: Verbal cues for sequencing;Tactile cues for sequencing Sit to Supine: 2: Max assist  Trunk/Postural Assessment  Cervical Assessment Cervical Assessment: Exceptions to [facility] Cervical Strength Overall Cervical Strength Comments: forward head, unable to sustain upright head positioning >5sec Thoracic Assessment Thoracic Assessment: Exceptions to [facility] Thoracic AROM Thoracic Flexion: Pt demonstrating significant kyphontic posture w/ scoliosis, L lean Thoracic Strength Overall Thoracic Strength Comments: Unable to maintain upright posture Lumbar Assessment Lumbar Assessment: Exceptions to [facility] Lumbar AROM Overall Lumbar AROM Comments: Pt demonstrating posterior pelvic tilt Lumbar Strength Overall Lumbar Strength Comments: Unable to sustain corrected positioning Postural Control Postural Control: Deficits on evaluation Head Control: poor, unable to sustain >5-10 seconds Trunk Control: poor, unable to sustain >5-10 seconds Righting Reactions:  absent-delayed Postural Limitations: premorbid deficits scoliosis and sarcoidosis   Balance Balance Balance Assessed: Yes Static Sitting Balance Static Sitting - Balance Support: Bilateral upper extremity supported;Feet supported Static Sitting - Level of Assistance: 4: Min assist;5: Stand by assistance Dynamic Sitting Balance Dynamic Sitting - Balance Support: Bilateral upper extremity supported;Feet supported Dynamic Sitting - Level of Assistance: 2: Max assist Dynamic Sitting Balance - Compensations: during BADL tasks EOB Dynamic Sitting - Balance Activities: Lateral lean/weight shifting;Forward lean/weight shifting Extremity/Trunk Assessment RUE Assessment RUE Assessment: Within Functional Limits (grossly 3+/5, AROM assessed with HOB up.) LUE Assessment LUE Assessment: Within Functional Limits (grossly 3+/5, AROM assessed  with HOB up.)  FIM:  FIM - Eating Eating Activity: 6: More than reasonable amount of time FIM - Grooming Grooming Steps: Wash, rinse, dry face;Wash, rinse, dry hands;Oral care, brush teeth, clean dentures;Brush, comb hair Grooming: 5: Set-up assist to obtain items FIM - Bathing Bathing Steps Patient Completed: Chest;Right Arm;Left Arm;Abdomen Bathing: 2: Max-Patient completes 3-4 30f 10 parts or 25-49% FIM - Upper Body Dressing/Undressing Upper body dressing/undressing steps patient completed: Thread/unthread right sleeve of pullover shirt/dresss;Thread/unthread left sleeve of pullover shirt/dress;Put head through opening of pull over shirt/dress;Pull shirt over trunk Upper body dressing/undressing: 5: Set-up assist to: Obtain clothing/put away FIM - Lower Body Dressing/Undressing Lower body dressing/undressing: 1: Total-Patient completed less than 25% of tasks FIM - Tub/Shower Transfers Tub/shower Transfers: 0-Activity did not occur or was simulated (unsafe at this time)   Refer to Care Plan for Long Term Goals  Recommendations for other services:  None  Discharge Criteria: Patient will be discharged from OT if patient refuses treatment 3 consecutive times without medical reason, if treatment goals not met, if there is a change in medical status, if patient makes no progress towards goals or if patient is discharged from hospital.  The above assessment, treatment plan, treatment alternatives and goals were discussed and mutually agreed upon: by patient and by family (sister Harriett Sine)  Arlyss Repress, [facility] 07/01/2013, 4:34 PM

## 2013-07-01 NOTE — H&P (Signed)
Physical Medicine and Rehabilitation Admission H&P  Chief Complaint   Patient presents with   .  Shortness of Breath   :  Chief complaint: Deconditioned  HPI: Elizabeth Drake is a 72 y.o. 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 Patient recently treated for third toe cellulitis right foot. Elizabeth Drake is wheelchair-bound prior to admission independent with transfers no ambulation. Admitted 06/24/2013 with shortness of breath and chest heaviness that worsened with minimal exertion. Troponin and cardiac enzymes negative. CT angiogram chest showed extensive pulmonary embolus with evidence of right ventricular strain. Noted pulmonary emboli involve significant portions of both right and left main pulmonary arteries. Patient placed on intravenous heparin and transitioned to Xarelto. Venous Doppler studies lower extremities 06/25/2013 with findings consistent of acute DVT distal femoral-popliteal vein of left lower extremity. Urine culture greater than 100,000 Escherichia coli and maintained on Rocephin. Physical therapy evaluation completed 06/29/2013 with recommendations for physical medicine rehabilitation consult to consider inpatient rehabilitation services. Patient was felt to be a good candidate for inpatient rehabilitation services was admitted for a comprehensive rehabilitation program   Slept poorly, late admission to rehabilitation unit ROS Review of Systems  Respiratory: Positive for shortness of breath.  Gastrointestinal: Positive for constipation.  Genitourinary: Positive for urgency.  Musculoskeletal: Positive for myalgias.  All other systems reviewed and are negative  Past Medical History   Diagnosis  Date   .  Sarcoidosis    .  Hiatal hernia    .  Diabetes mellitus without complication    .  Osteoporosis     Past Surgical History   Procedure  Laterality  Date   .  Abdominal hysterectomy     .  Joint  replacement       toe   .  Lung lobectomy  Left      Lower    Family History   Problem  Relation  Age of Onset   .  Heart disease  Mother    .  Hypertension  Mother    .  Cancer  Father      pancratic   .  Cancer - Other  Sister     Social History: reports that she quit smoking about 12 years ago. Her smoking use included Cigarettes. She smoked 0.00 packs per day. She does not have any smokeless tobacco history on file. She reports that she does not drink alcohol or use illicit drugs.  Allergies:  Allergies   Allergen  Reactions   .  Advil [Ibuprofen]  Shortness Of Breath and Swelling   .  Azo [Phenazopyridine]  Other (See Comments)     Unknown reaction   .  Indocin [Indomethacin]  Other (See Comments)     Unknown reaction   .  Loratadine  Other (See Comments)     unknown   .  Percocet [Oxycodone-Acetaminophen]  Itching    Medications Prior to Admission   Medication  Sig  Dispense  Refill   .  calcium-vitamin D (OSCAL 500/200 D-3) 500-200 MG-UNIT per tablet  Take 1 tablet by mouth daily with breakfast.     .  darifenacin (ENABLEX) 15 MG 24 hr tablet  Take 15 mg by mouth daily.     .  diphenhydrAMINE (BENADRYL) 25 MG tablet  Take 25 mg by mouth daily as needed for allergies.     Marland Kitchen  HYDROcodone-acetaminophen (NORCO) 10-325 MG per tablet  Take 1 tablet by mouth every 6 (six)  hours as needed for severe pain.     .  Multiple Vitamin (MULTIVITAMIN WITH MINERALS) TABS tablet  Take 1 tablet by mouth daily.     .  mupirocin ointment (BACTROBAN) 2 %  Place 1 application into the nose 2 (two) times daily. Apply to toes     .  ondansetron (ZOFRAN) 8 MG tablet  Take 1 tablet (8 mg total) by mouth every 8 (eight) hours as needed for nausea.  20 tablet  1   .  predniSONE (DELTASONE) 5 MG tablet  Take 10 mg by mouth daily with breakfast.     .  [DISCONTINUED] glimepiride (AMARYL) 2 MG tablet  Take 1 tablet (2 mg total) by mouth daily.  30 tablet  1   .  [DISCONTINUED] metFORMIN (GLUCOPHAGE) 500  MG tablet  Take 1 tablet (500 mg total) by mouth daily with breakfast.  30 tablet  3   .  [DISCONTINUED] trimethoprim (TRIMPEX) 100 MG tablet  Take 100 mg by mouth at bedtime.      Home:  Home Living  Family/patient expects to be discharged to:: Inpatient rehab  Living Arrangements: Spouse/significant other;Children  Additional Comments: son lives with them ~ 1/2 the time  Functional History:  Prior Function  Comments: modified independent at wheelchair transfer level  Functional Status:  Mobility:  Bed Mobility  Bed Mobility: Rolling Right;Right Sidelying to Sit;Sitting - Scoot to Edge of Bed  Rolling Right: 6: Modified independent (Device/Increase time);With rail  Right Sidelying to Sit: 3: Mod assist;With rails;HOB flat  Sitting - Scoot to Edge of Bed: 3: Mod assist;With rail  Transfers  Transfers: Metallurgist Transfers: 2: Max assist;With upper extremity assistance;With armrests   Wheelchair Mobility  Wheelchair Mobility: No  ADL:   Cognition:  Cognition  Overall Cognitive Status: Within Functional Limits for tasks assessed  Orientation Level: Oriented X4  Cognition  Arousal/Alertness: Awake/alert  Behavior During Therapy: WFL for tasks assessed/performed  Overall Cognitive Status: Within Functional Limits for tasks assessed  Physical Exam:  Blood pressure 153/82, pulse 86, temperature 98 F (36.7 C), temperature source Oral, resp. rate 18, height 5\' 6"  (1.676 m), weight 54.1 kg (119 lb 4.3 oz), SpO2 94.00%.  Physical Exam  Physical Exam  Nursing note reviewed.  Constitutional: She is oriented to person, place, and time.  HENT:  Head: Normocephalic.  Eyes: EOM are normal.  Neck: Normal range of motion. Neck supple. No thyromegaly present.  Cardiovascular: Normal rate and regular rhythm.  Respiratory: Effort normal and breath sounds normal. No respiratory distress.  GI: Soft. Bowel sounds are normal. She exhibits no distension.  Neurological:  She is alert and oriented to person, place, and time.  Follows full commands  Skin: Skin is warm and dry.  Psychiatric: She has a normal mood and affect.  motor strength is 4+/5 bilateral deltoid, bicep, tricep, grip  2 minus right hip flexor knee extensor right ankle dorsiflexor plantar flexor  3 minus/5 left hip flexor knee extensor ankle dorsiflexor and plantar flexor  Sensory intact to light touch in both upper and lower limb  Results for orders placed during the hospital encounter of 06/24/13 (from the past 48 hour(s))   GLUCOSE, CAPILLARY Status: Abnormal    Collection Time    06/28/13 11:31 AM   Result  Value  Range    Glucose-Capillary  188 (*)  70 - 99 mg/dL   GLUCOSE, CAPILLARY Status: Abnormal    Collection Time    06/28/13 4:23  PM   Result  Value  Range    Glucose-Capillary  246 (*)  70 - 99 mg/dL   GLUCOSE, CAPILLARY Status: Abnormal    Collection Time    06/28/13 9:18 PM   Result  Value  Range    Glucose-Capillary  110 (*)  70 - 99 mg/dL   CBC Status: Abnormal    Collection Time    06/29/13 5:10 AM   Result  Value  Range    WBC  12.7 (*)  4.0 - 10.5 K/uL    RBC  3.64 (*)  3.87 - 5.11 MIL/uL    Hemoglobin  12.0  12.0 - 15.0 g/dL    HCT  16.1  09.6 - 04.5 %    MCV  99.7  78.0 - 100.0 fL    MCH  33.0  26.0 - 34.0 pg    MCHC  33.1  30.0 - 36.0 g/dL    RDW  40.9  81.1 - 91.4 %    Platelets  157  150 - 400 K/uL   GLUCOSE, CAPILLARY Status: Abnormal    Collection Time    06/29/13 6:21 AM   Result  Value  Range    Glucose-Capillary  69 (*)  70 - 99 mg/dL   GLUCOSE, CAPILLARY Status: Abnormal    Collection Time    06/29/13 6:49 AM   Result  Value  Range    Glucose-Capillary  115 (*)  70 - 99 mg/dL    Comment 1  Documented in Chart     Comment 2  Notify RN    GLUCOSE, CAPILLARY Status: Abnormal    Collection Time    06/29/13 11:18 AM   Result  Value  Range    Glucose-Capillary  148 (*)  70 - 99 mg/dL    Comment 1  Notify RN    GLUCOSE, CAPILLARY Status:  Abnormal    Collection Time    06/29/13 4:35 PM   Result  Value  Range    Glucose-Capillary  168 (*)  70 - 99 mg/dL    Comment 1  Notify RN    GLUCOSE, CAPILLARY Status: Abnormal    Collection Time    06/29/13 9:20 PM   Result  Value  Range    Glucose-Capillary  120 (*)  70 - 99 mg/dL   CBC Status: Abnormal    Collection Time    06/30/13 5:00 AM   Result  Value  Range    WBC  13.8 (*)  4.0 - 10.5 K/uL    RBC  4.02  3.87 - 5.11 MIL/uL    Hemoglobin  13.3  12.0 - 15.0 g/dL    HCT  78.2  95.6 - 21.3 %    MCV  98.5  78.0 - 100.0 fL    MCH  33.1  26.0 - 34.0 pg    MCHC  33.6  30.0 - 36.0 g/dL    RDW  08.6  57.8 - 46.9 %    Platelets  189  150 - 400 K/uL   GLUCOSE, CAPILLARY Status: Abnormal    Collection Time    06/30/13 6:24 AM   Result  Value  Range    Glucose-Capillary  148 (*)  70 - 99 mg/dL    No results found.  Post Admission Physician Evaluation:  1. 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 2. Patient is admitted to receive  collaborative, interdisciplinary care between the physiatrist, rehab nursing staff, and therapy team. 3. Patient's level of medical complexity and substantial therapy needs in context of that medical necessity cannot be provided at a lesser intensity of care such as a SNF. 4. Patient has experienced substantial functional loss from his/her baseline which was documented above under the "Functional History" and "Functional Status" headings. Judging by the patient's diagnosis, physical exam, and functional history, the patient has potential for functional progress which will result in measurable gains while on inpatient rehab. These gains will be of substantial and practical use upon discharge in facilitating mobility and self-care at the household level. 5. Physiatrist will provide 24 hour management of medical needs as well as  oversight of the therapy plan/treatment and provide guidance as appropriate regarding the interaction of the two. 6. 24 hour rehab nursing will assist with bladder management, bowel management, safety, skin/wound care, disease management, medication administration, pain management and patient education and help integrate therapy concepts, techniques,education, etc. 7. PT will assess and treat for/with: pre gait, gait training, endurance , safety, equipment, neuromuscular re education. Goals are: sup mob. 8. OT will assess and treat for/with: ADLs, Cognitive perceptual skills, Neuromuscular re education, safety, endurance, equipment. Goals are: sup ADL. 9. SLP will assess and treat for/with: NA. Goals are: NA. 10. Case Management and Social Worker will assess and treat for psychological issues and discharge planning. 11. Team conference will be held weekly to assess progress toward goals and to determine barriers to discharge. 12. Patient will receive at least 3 hours of therapy per day at least 5 days per week. 13. ELOS: 2 wks  14. Prognosis: good Medical Problem List and Plan  1. 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 DVT Prophylaxis/Anticoagulation: Xarelto. Monitor for any bleeding episodes  3. Pain Management: Hydrocodone as needed. Monitor with increased mobility  4. Neuropsych: This patient is capable of making decisions on her own behalf.  5. Sarcoidosis. Continue chronic prednisone. Patient with pronounced bilateral lower extremity weakness  6. Diabetes mellitus with peripheral neuropathy. Hemoglobin A1c 7.4. Amaryl 4 mg daily. Check blood sugars a.c. and at bedtime  7. Hypertension. Lopressor 25 mg twice a day. Monitor with increased mobility  8. Escherichia coli urinary tract infection. Complete Rocephin as directed   Erick Colace M.D. [facility] FAAPM&R (Sports Med, Neuromuscular  Med) Diplomate Am Board of Electrodiagnostic Med Diplomate Am Board of Pain Medicine Fellow Am Board of Interventional Pain Physicians  06/30/2013

## 2013-07-01 NOTE — Progress Notes (Signed)
Occupational Therapy Note  Patient Details  Name: Elizabeth Drake MRN: [id number] Date of Birth: 1941/04/19 Today's Date: 07/01/2013  Time: 1300-1345 Pt c/o unspecified pain; RN administering meds on arrival Individual Therapy  Individual treatment initiated after verbal instructions/exchange from evaluating OTR.  Pt resting in bed upon arrival but easily aroused.  Pt agreeable to engaging in bed mobility and BUE therex with 1# weight bar.  Pt exhibited increased fatigue with exercises after 5 reps of each set but was able to continue for remaining reps.  Pt required max A for bed mobility (rolling from side to side).   Lavone Neri [facility] 07/01/2013, 3:56 PM

## 2013-07-01 NOTE — Evaluation (Signed)
Physical Therapy Assessment and Plan  Patient Details  Name: Elizabeth Drake MRN: [id number] Date of Birth: 1940/10/09  PT Diagnosis: Abnormal posture, Muscle weakness, decreased balance, decreased functional endurance, decreased postural control Rehab Potential:  Good ELOS: 14-18days   Today's Date: 07/01/2013 Time: Treatment Session 1: [id number]; Treatment Session 2: [id number] Time Calculation (min): Treatment Session 1: 60 min; Treatment Session 2:  Problem List:  Patient Active Problem List   Diagnosis Date Noted  . Physical deconditioning 06/30/2013  . DKA, type 2 06/25/2013  . Pulmonary embolism 06/24/2013  . PE (pulmonary thromboembolism) 06/24/2013  . Hypoxemia 06/24/2013  . Tachycardia 06/24/2013  . Cellulitis of foot without toes, right 06/24/2013  . DM type 2 (diabetes mellitus, type 2) 04/25/2013  . OAB (overactive bladder) 04/25/2013  . Cellulitis of toe of left foot with ulcer 04/25/2013  . Leukocytosis, unspecified 04/25/2013  . 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 Wheelchair bound 04/25/2013  . UTI (lower urinary tract infection) 04/25/2013  . Osteoporosis, unspecified 04/25/2013  . Chronic back pain 04/25/2013  . Chronic leg pain 04/25/2013  . OA (osteoarthritis) 04/25/2013  . Hyperlipidemia 12/31/2012  . Sarcoidosis 12/31/2012    Past Medical History:  Past Medical History  Diagnosis Date  . Sarcoidosis   . Hiatal hernia   . Diabetes mellitus without complication   . Osteoporosis    Past Surgical History:  Past Surgical History  Procedure Laterality Date  . Abdominal hysterectomy    . Joint replacement      toe  . Lung lobectomy Left     Lower    Assessment & Plan Clinical Impression: Elizabeth Drake is a 72 y.o. 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 Patient recently treated for third toe cellulitis right foot. [name] is wheelchair-bound prior to admission  independent with transfers no ambulation. Admitted 06/24/2013 with shortness of breath and chest heaviness that worsened with minimal exertion. Troponin and cardiac enzymes negative. CT angiogram chest showed extensive pulmonary embolus with evidence of right ventricular strain. Noted pulmonary emboli involve significant portions of both right and left main pulmonary arteries. Patient placed on intravenous heparin and transitioned to Xarelto. Venous Doppler studies lower extremities 06/25/2013 with findings consistent of acute DVT distal femoral-popliteal vein of left lower extremity. Urine culture greater than 100,000 Escherichia coli and maintained on Rocephin. Physical therapy evaluation completed 06/29/2013 with recommendations for physical medicine rehabilitation consult to consider inpatient rehabilitation services. Patient was felt to be a good candidate for inpatient rehabilitation services was admitted for a comprehensive rehabilitation program.  Patient transferred to CIR on 06/30/2013 .   Patient currently requires max with mobility secondary to muscle weakness, decreased functional endurance, decreased postural control, abnormal postural alignment and decreased balance.  Prior to hospitalization, patient was modified independent  with mobility and lived with Spouse;Son in a House home.  Home access is  Ramped entrance.  Patient will benefit from skilled PT intervention to maximize safe functional mobility, minimize fall risk and decrease caregiver burden for planned discharge home with 24 hour assist.  Anticipate patient will benefit from follow up [facility] at discharge.  PT Assessment Barriers to Discharge: Decreased caregiver support Barriers to Discharge Comments: Husband can only provide limited assist  PT Recommendation Patient destination: Home (Home vs. SNF)  Skilled Therapeutic Interventi Treatment Session 1:  1:1. Pt received semi-reclined in bed, agreeable to therapy. PT evaluation  performed, see detailed objective information below. Cognition WFL, however, pt demonstrating significant deficits  regarding postural alignment, postural control, balance and strength. Tx initiated w/ emphasis on functional transfers including sup<>sit EOB and squat pivot t/f bed<>w/c as well as obtaining/fitting pt for more appropriate w/c due to impairments discussed above. Pt semi-reclined in bed at end of session w/ all needs in reach. Pt's sister, Harriett Sine in room at end of session, educated regarding therapy and goals.   Treatment Session 2:  1:1. Pt received semi-reclined in bed, stated feeling very fatigued but willing to participate as able. Focus this session on bed mobility, squat pivot t/f and continued assessment regarding w/c positioning. Pt req max A for t/f sup<>sit w/ HOB elevated and use of bed rails, max to total A for squat pivot t/f bed<>w/c 2/2 fatigue. Pt's w/c modified for rigid back and dump for improved positioning and postural support. Pt with good tolerance to positional changes of w/c w/  improved head/trunk posture observed. Due to pt's abnormal postural alignment, poor trunk control and difficulty w/ B UE propulsion, pt to benefit from customized w/c evaluation. Pt semi-reclined in bed at end of session w/ all needs in reach and bed alarm on.   PT Evaluation Precautions/Restrictions Precautions Precautions: Fall Restrictions Weight Bearing Restrictions: No General Chart Reviewed: Yes Family/Caregiver Present: Yes (For last of tx session, sister Harriett Sine) Vital Signs  Pain Pain Assessment Pain Assessment: Faces Faces Pain Scale: Hurts a little bit Pain Type: Chronic pain Pain Location: Leg Pain Orientation: Left;Right Pain Descriptors / Indicators: Aching Pain Intervention(s): Distraction;Repositioned Home Living/Prior Functioning Home Living Available Help at Discharge: Available 24 hours/day;Family Type of Home: House Home Access: Ramped entrance Home  Layout: One level Additional Comments: son lives with them ~ 1/2 the time  Lives With: Spouse;Son Prior Function Level of Independence: Independent with homemaking with wheelchair;Independent with transfers;Requires assistive device for independence;Other (comment) (Non-ambulatory PTA)  Able to Take Stairs?: No Driving: No Comments: modified independent at wheelchair transfer level Vision/Perception  Vision - History Baseline Vision: Wears glasses only for reading Perception Perception: Within Functional Limits Praxis Praxis: Intact  Cognition Overall Cognitive Status: Within Functional Limits for tasks assessed Arousal/Alertness: Awake/alert Orientation Level: Oriented X4 Attention: Selective Selective Attention: Appears intact Memory: Appears intact Awareness: Appears intact Problem Solving: Appears intact Safety/Judgment: Impaired Comments: Safety intact, however, not realistic at this time regarding ability to d/c at Mod(I) level Sensation Sensation Light Touch: Appears Intact Proprioception: Appears Intact Coordination Gross Motor Movements are Fluid and Coordinated: No Fine Motor Movements are Fluid and Coordinated: No Motor  Motor Motor: Abnormal postural alignment and control Motor - Skilled Clinical Observations: Significantly decreased strength in core and B LE>B UE w/ R side weaker than L side  Mobility Bed Mobility Bed Mobility: Rolling Right;Right Sidelying to Sit;Sit to Supine Rolling Right: 3: Mod assist Rolling Right Details: Verbal cues for sequencing Right Sidelying to Sit: 2: Max assist;HOB flat;With rails Right Sidelying to Sit Details: Verbal cues for sequencing;Tactile cues for sequencing Sit to Supine: 2: Max assist Transfers Transfers: Yes Squat Pivot Transfers: 2: Max assist;With upper extremity assistance;With armrests Squat Pivot Transfer Details: Verbal cues for sequencing;Verbal cues for precautions/safety;Visual cues/gestures for  sequencing Locomotion  Ambulation Ambulation: No (Pt non-ambulatory PTA) Wheelchair Mobility Wheelchair Mobility: Yes Wheelchair Assistance: 4: Administrator, sports Details: Verbal cues for Copy: Both upper extremities Wheelchair Parts Management: Needs assistance Distance: 84'  Trunk/Postural Assessment  Cervical Assessment Cervical Assessment: Exceptions to [facility] Cervical Strength Overall Cervical Strength Comments: forward head, unable to sustain upright head positioning >5sec  Thoracic Assessment Thoracic Assessment: Exceptions to [facility] Thoracic AROM Thoracic Flexion: Pt demonstrating significant kyphontic posture 2/2 scoliosis, L lean Thoracic Strength Overall Thoracic Strength Comments: Unable to maintain upright posture Lumbar Assessment Lumbar Assessment: Exceptions to [facility] Lumbar AROM Overall Lumbar AROM Comments: Pt demonstrating posterior pelvic tilt Lumbar Strength Overall Lumbar Strength Comments: Unable to sustain corrected positioning Postural Control Postural Control: Deficits on evaluation Head Control: poor, unable to sustain >5-10 seconds Trunk Control: poor, unable to sustain >5-10 seconds Righting Reactions: absent-delayed Postural Limitations: premorbid deficits 2/2 scoliosis and sarcoidosis   Balance Balance Balance Assessed: Yes Static Sitting Balance Static Sitting - Balance Support: Bilateral upper extremity supported;Feet supported Static Sitting - Level of Assistance: 3: Mod assist;4: Min assist;5: Stand by assistance Dynamic Sitting Balance Dynamic Sitting - Balance Support: Bilateral upper extremity supported;Feet supported Dynamic Sitting - Level of Assistance: 2: Max assist;1: +1 Total assist Dynamic Sitting - Balance Activities: Lateral lean/weight shifting;Forward lean/weight shifting Extremity Assessment      RLE Assessment RLE Assessment: Exceptions to [facility] RLE Strength Right Hip Flexion:  2-/5 Right Knee Flexion: 3-/5 Right Knee Extension: 3-/5 Right Ankle Dorsiflexion: 0/5 Right Ankle Plantar Flexion: 0/5 LLE Assessment LLE Assessment: Exceptions to [facility] LLE Strength Left Hip Flexion: 2+/5 Left Knee Flexion: 3-/5 Left Knee Extension: 3-/5 Left Ankle Dorsiflexion: 2+/5 Left Ankle Plantar Flexion: 2+/5  FIM:  FIM - Bed/Chair Transfer Bed/Chair Transfer Assistive Devices: Bed rails Bed/Chair Transfer: 2: Sit > Supine: Max A (lifting assist/Pt. 25-49%);2: Supine > Sit: Max A (lifting assist/Pt. 25-49%);2: Chair or W/C > Bed: Max A (lift and lower assist);2: Bed > Chair or W/C: Max A (lift and lower assist) FIM - Locomotion: Wheelchair Distance: 75' Locomotion: Wheelchair: 2: Travels 50 - 149 ft with minimal assistance (Pt.>75%) FIM - Locomotion: Ambulation Locomotion: Ambulation: 0: Activity did not occur (Pt non-ambulatory PTA) FIM - Locomotion: Stairs Locomotion: Stairs:  (Pt unable to negotiate steps PTA)   Refer to Care Plan for Long Term Goals  Recommendations for other services: Neuropsych  Discharge Criteria: Patient will be discharged from PT if patient refuses treatment 3 consecutive times without medical reason, if treatment goals not met, if there is a change in medical status, if patient makes no progress towards goals or if patient is discharged from hospital.  The above assessment, treatment plan, treatment alternatives and goals were discussed and mutually agreed upon: by patient  Denzil Hughes 07/01/2013, 3:20 PM

## 2013-07-01 NOTE — IPOC Note (Addendum)
Overall Plan of Care [facility]) Patient Details Name: Elizabeth Drake MRN: [id number] DOB: 03/01/1941  Admitting Diagnosis: [facility] Problems: Active Problems:   Physical deconditioning     Functional Problem List: Nursing Bladder;Endurance;Medication Management;Motor;Pain;Safety;Sensory;Skin Integrity  PT Balance;Endurance;Safety  OT Balance;Endurance;Motor;Safety;Pain  SLP    TR  activity tolerance, balance, safety, pain       Basic ADL's: OT Grooming;Bathing;Dressing;Toileting     Advanced  ADL's: OT       Transfers: PT Bed Mobility;Bed to Chair;Car;Furniture  OT Toilet;Tub/Shower     Locomotion: PT Wheelchair Mobility     Additional Impairments: OT    SLP        TR      Anticipated Outcomes Item Anticipated Outcome  Self Feeding    Swallowing      Basic self-care  overall min assist  Toileting  overall min assist   Bathroom Transfers min assist-tub bench  Bowel/Bladder  Continent of bowel and bladder with timed tolieting   Transfers  Min A  Locomotion  Supervision at w/c level  Communication     Cognition     Pain  No c/o pain   Safety/Judgment  Decresed risk of fall with mod assistance   Therapy Plan: PT Intensity: Minimum of 1-2 x/day ,45 to 90 minutes PT Frequency: 5 out of 7 days PT Duration Estimated Length of Stay: 14-18days OT Intensity: Minimum of 1-2 x/day, 45 to 90 minutes OT Frequency: 5 out of 7 days OT Duration/Estimated Length of Stay: 14 days         Team Interventions: Nursing Interventions Patient/Family Education;Bladder Management;Disease Management/Prevention;Pain Management;Psychosocial Support;Medication Management;Skin Care/Wound Management  PT interventions DME/adaptive equipment instruction;UE/LE Strength taining/ROM;Wheelchair propulsion/positioning;Psychosocial support;Disease management/prevention;Functional mobility training;Patient/family education;Therapeutic Exercise;UE/LE Coordination  activities;Therapeutic Activities;Pain management;Discharge planning;Balance/vestibular training  OT Interventions Balance/vestibular training;DME/adaptive equipment instruction;Discharge planning;Functional mobility training;Patient/family education;Self Care/advanced ADL retraining;UE/LE Strength taining/ROM;Therapeutic Activities;Wheelchair propulsion/positioning  SLP Interventions    TR Interventions  recreation/leisure participation, therapeutic activities, balance training, adaptive equipment instruction, pt/family education, functional mobility, psychosocial support  SW/CM Interventions      Team Discharge Planning: Destination: PT-Home (Home vs. SNF) ,OT- Home , SLP-  Projected Follow-up: PT-Home health PT, OT-  Home health OT;24 hour supervision/assistance, SLP-  Projected Equipment Needs: PT-Wheelchair (measurements);Wheelchair cushion (measurements), OT- To be determined (per patient- she has BSC and tub bench), SLP-  Equipment Details: PT- , OT-  Patient/family involved in discharge planning: PT- Patient;Family member/caregiver (Sister, Harriett Sine, present for last of session),  OT-Patient;Family member/caregiver, SLP-   MD ELOS: 14-16 days Medical Rehab Prognosis:  Excellent Assessment: The patient has been admitted for CIR therapies. The team will be addressing, functional mobility, strength, stamina, balance, safety, adaptive techniques/equipment, self-care, bowel and bladder mgt, patient and caregiver education, NMR. Goals have been set at supervision to minimal assistance with self-care and mobility.    Ranelle Oyster, MD, FAAPMR      See Team Conference Notes for weekly updates to the plan of care

## 2013-07-02 LAB — GLUCOSE, CAPILLARY
Glucose-Capillary: 237 mg/dL — ABNORMAL HIGH (ref 70–99)
Glucose-Capillary: 211 mg/dL — ABNORMAL HIGH (ref 70–99)
Glucose-Capillary: 312 mg/dL — ABNORMAL HIGH (ref 70–99)

## 2013-07-02 MED ORDER — CEFUROXIME AXETIL 250 MG PO TABS
250.0000 mg | ORAL_TABLET | Freq: Two times a day (BID) | ORAL | Status: AC
  Administered 2013-07-02 – 2013-07-06 (×10): 250 mg via ORAL
  Filled 2013-07-02 (×10): qty 1

## 2013-07-02 MED ORDER — FLUCONAZOLE 150 MG PO TABS
150.0000 mg | ORAL_TABLET | Freq: Once | ORAL | Status: AC
  Administered 2013-07-02: 150 mg via ORAL
  Filled 2013-07-02: qty 1

## 2013-07-02 MED ORDER — BISACODYL 10 MG RE SUPP
10.0000 mg | Freq: Every day | RECTAL | Status: DC | PRN
  Administered 2013-07-02: 10 mg via RECTAL
  Filled 2013-07-02: qty 1

## 2013-07-02 NOTE — Progress Notes (Signed)
Subjective/Complaints: Elizabeth Drake is a 72 y.o. 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 Patient recently treated for third toe cellulitis right foot. Elizabeth Drake is wheelchair-bound prior to admission independent with transfers no ambulation. Admitted 06/24/2013 with shortness of breath and chest heaviness that worsened with minimal exertion. Troponin and cardiac enzymes negative. CT angiogram chest showed extensive pulmonary embolus with evidence of right ventricular strain. Noted pulmonary emboli involve significant portions of both right and left main pulmonary arteries. Patient placed on intravenous heparin and transitioned to Xarelto. Venous Doppler studies lower extremities 06/25/2013 with findings consistent of acute DVT distal femoral-popliteal vein of left lower extremity  Slept poorly. Still has family and would like to try putting. Constipated  Review of Systems - Negative except Weakness in the legs also see above  Objective: Vital Signs: Blood pressure 144/74, pulse 82, temperature 97.6 F (36.4 C), temperature source Oral, resp. rate 18, weight 54.1 kg (119 lb 4.3 oz), SpO2 96.00%. No results found. Results for orders placed during the hospital encounter of 06/30/13 (from the past 72 hour(s))  GLUCOSE, CAPILLARY     Status: Abnormal   Collection Time    06/30/13  9:00 PM      Result Value Range   Glucose-Capillary 145 (*) 70 - 99 mg/dL   Comment 1 Notify RN    CBC WITH DIFFERENTIAL     Status: Abnormal   Collection Time    07/01/13  5:00 AM      Result Value Range   WBC 10.7 (*) 4.0 - 10.5 K/uL   RBC 4.18  3.87 - 5.11 MIL/uL   Hemoglobin 13.9  12.0 - 15.0 g/dL   HCT 28.4  13.2 - 44.0 %   MCV 99.8  78.0 - 100.0 fL   MCH 33.3  26.0 - 34.0 pg   MCHC 33.3  30.0 - 36.0 g/dL   RDW 10.2  72.5 - 36.6 %   Platelets 189  150 - 400 K/uL   Neutrophils Relative % 71  43 - 77 %   Neutro Abs 7.6  1.7  - 7.7 K/uL   Lymphocytes Relative 19  12 - 46 %   Lymphs Abs 2.0  0.7 - 4.0 K/uL   Monocytes Relative 9  3 - 12 %   Monocytes Absolute 1.0  0.1 - 1.0 K/uL   Eosinophils Relative 1  0 - 5 %   Eosinophils Absolute 0.1  0.0 - 0.7 K/uL   Basophils Relative 0  0 - 1 %   Basophils Absolute 0.0  0.0 - 0.1 K/uL  COMPREHENSIVE METABOLIC PANEL     Status: Abnormal   Collection Time    07/01/13  5:00 AM      Result Value Range   Sodium 142  135 - 145 mEq/L   Potassium 3.5  3.5 - 5.1 mEq/L   Chloride 101  96 - 112 mEq/L   CO2 34 (*) 19 - 32 mEq/L   Glucose, Bld 132 (*) 70 - 99 mg/dL   BUN 17  6 - 23 mg/dL   Creatinine, Ser 4.40  0.50 - 1.10 mg/dL   Calcium 9.8  8.4 - 34.7 mg/dL   Total Protein 6.1  6.0 - 8.3 g/dL   Albumin 2.5 (*) 3.5 - 5.2 g/dL   AST 19  0 - 37 U/L   ALT 77 (*) 0 - 35 U/L   Alkaline Phosphatase 62  39 - 117 U/L  Total Bilirubin 0.4  0.3 - 1.2 mg/dL   GFR calc non Af Amer 84 (*) >90 mL/min   GFR calc Af Amer >90  >90 mL/min   Comment: (NOTE)     The eGFR has been calculated using the CKD EPI equation.     This calculation has not been validated in all clinical situations.     eGFR's persistently <90 mL/min signify possible Chronic Kidney     Disease.  GLUCOSE, CAPILLARY     Status: Abnormal   Collection Time    07/01/13  7:33 AM      Result Value Range   Glucose-Capillary 140 (*) 70 - 99 mg/dL   Comment 1 Notify RN    GLUCOSE, CAPILLARY     Status: Abnormal   Collection Time    07/01/13 11:42 AM      Result Value Range   Glucose-Capillary 261 (*) 70 - 99 mg/dL   Comment 1 Notify RN    GLUCOSE, CAPILLARY     Status: Abnormal   Collection Time    07/01/13  4:57 PM      Result Value Range   Glucose-Capillary 251 (*) 70 - 99 mg/dL   Comment 1 Notify RN    GLUCOSE, CAPILLARY     Status: Abnormal   Collection Time    07/01/13  8:14 PM      Result Value Range   Glucose-Capillary 213 (*) 70 - 99 mg/dL   Comment 1 Notify RN    GLUCOSE, CAPILLARY     Status:  Abnormal   Collection Time    07/02/13  9:00 AM      Result Value Range   Glucose-Capillary 237 (*) 70 - 99 mg/dL   Comment 1 Notify RN       HEENT: normal Cardio: RRR and No murmur Resp: CTA B/L and Unlabored GI: BS positive and Nondistended Extremity:  Pulses positive and No Edema Skin:   Intact Neuro: Alert/Oriented, Cranial Nerve II-XII normal, Normal Sensory and Abnormal Motor 4+/5 bilateral deltoid, bicep, tricep, grip 2 minus in the right hip flexor 3 minus knee extensor 2 minus right ankle dorsiflexor plantar flexor3 minus left hip flexor knee extensor ankle dorsiflexor plantar flexor Musc/Skel:  Other Atrophy in the right leg General no acute distress   Assessment/Plan: 1. Functional deficits secondary to  deconditioning after pulmonary embolism and DVT in a patient with chronic paraparesis  which require 3+ hours per day of interdisciplinary therapy in a comprehensive inpatient rehab setting. Physiatrist is providing close team supervision and 24 hour management of active medical problems listed below. Physiatrist and rehab team continue to assess barriers to discharge/monitor patient progress toward functional and medical goals. FIM: FIM - Bathing Bathing Steps Patient Completed: Chest;Right Arm;Left Arm;Abdomen Bathing: 2: Max-Patient completes 3-4 25f 10 parts or 25-49%  FIM - Upper Body Dressing/Undressing Upper body dressing/undressing steps patient completed: Thread/unthread right sleeve of pullover shirt/dresss;Thread/unthread left sleeve of pullover shirt/dress;Put head through opening of pull over shirt/dress;Pull shirt over trunk Upper body dressing/undressing: 5: Set-up assist to: Obtain clothing/put away FIM - Lower Body Dressing/Undressing Lower body dressing/undressing: 1: Total-Patient completed less than 25% of tasks        FIM - Banker Devices: Bed rails Bed/Chair Transfer: 2: Sit > Supine: Max A (lifting  assist/Pt. 25-49%);2: Supine > Sit: Max A (lifting assist/Pt. 25-49%);2: Chair or W/C > Bed: Max A (lift and lower assist);2: Bed > Chair or W/C: Max A (lift and lower  assist)  FIM - Locomotion: Wheelchair Distance: 75' Locomotion: Wheelchair: 2: Travels 50 - 149 ft with minimal assistance (Pt.>75%) FIM - Locomotion: Ambulation Locomotion: Ambulation: 0: Activity did not occur (Pt non-ambulatory PTA)  Comprehension Comprehension Mode: Auditory Comprehension: 5-Understands complex 90% of the time/Cues < 10% of the time  Expression Expression Mode: Verbal Expression: 5-Expresses complex 90% of the time/cues < 10% of the time  Social Interaction Social Interaction: 6-Interacts appropriately with others with medication or extra time (anti-anxiety, antidepressant).  Problem Solving Problem Solving: 4-Solves basic 75 - 89% of the time/requires cueing 10 - 24% of the time  Memory Memory: 5-Recognizes or recalls 90% of the time/requires cueing < 10% of the time  Medical Problem List and Plan  1. 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 DVT Prophylaxis/Anticoagulation: Xarelto. Monitor for any bleeding episodes  3. Pain Management: Hydrocodone as needed. Monitor with increased mobility  4. Neuropsych: This patient is capable of making decisions on her own behalf.  5. Sarcoidosis. Continue chronic prednisone. Patient with pronounced bilateral lower extremity weakness  6. Diabetes mellitus with peripheral neuropathy. Hemoglobin A1c 7.4. Amaryl 4 mg daily. Check blood sugars a.c. and at bedtime  7. Hypertension. Lopressor 25 mg twice a day. Monitor with increased mobility  8. Escherichia coli urinary tract infection. Complete Rocephin as directed    LOS (Days) 2 A FACE TO FACE EVALUATION WAS PERFORMED  KIRSTEINS,ANDREW E 07/02/2013, 10:31 AM

## 2013-07-03 ENCOUNTER — Inpatient Hospital Stay (HOSPITAL_COMMUNITY): Payer: Medicare Other

## 2013-07-03 ENCOUNTER — Inpatient Hospital Stay (HOSPITAL_COMMUNITY): Payer: No Typology Code available for payment source

## 2013-07-03 LAB — GLUCOSE, CAPILLARY
Glucose-Capillary: 161 mg/dL — ABNORMAL HIGH (ref 70–99)
Glucose-Capillary: 292 mg/dL — ABNORMAL HIGH (ref 70–99)
Glucose-Capillary: 298 mg/dL — ABNORMAL HIGH (ref 70–99)

## 2013-07-03 MED ORDER — METFORMIN HCL 500 MG PO TABS
500.0000 mg | ORAL_TABLET | Freq: Two times a day (BID) | ORAL | Status: DC
  Administered 2013-07-03 – 2013-07-08 (×10): 500 mg via ORAL
  Filled 2013-07-03 (×12): qty 1

## 2013-07-03 NOTE — Progress Notes (Signed)
Subjective/Complaints: Elizabeth Drake is a 72 y.o. 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 Patient recently treated for third toe cellulitis right foot. Elizabeth Drake is wheelchair-bound prior to admission independent with transfers no ambulation. Admitted 06/24/2013 with shortness of breath and chest heaviness that worsened with minimal exertion. Troponin and cardiac enzymes negative. CT angiogram chest showed extensive pulmonary embolus with evidence of right ventricular strain. Noted pulmonary emboli involve significant portions of both right and left main pulmonary arteries. Patient placed on intravenous heparin and transitioned to Xarelto. Venous Doppler studies lower extremities 06/25/2013 with findings consistent of acute DVT distal femoral-popliteal vein of left lower extremity  Slept poorly. Still has family and would like to try putting. Constipated  Review of Systems - Negative except Weakness in the legs also see above  Objective: Vital Signs: Blood pressure 137/77, pulse 75, temperature 97.7 F (36.5 C), temperature source Oral, resp. rate 18, weight 53.1 kg (117 lb 1 oz), SpO2 97.00%. No results found. Results for orders placed during the hospital encounter of 06/30/13 (from the past 72 hour(s))  GLUCOSE, CAPILLARY     Status: Abnormal   Collection Time    06/30/13  9:00 PM      Result Value Range   Glucose-Capillary 145 (*) 70 - 99 mg/dL   Comment 1 Notify RN    CBC WITH DIFFERENTIAL     Status: Abnormal   Collection Time    07/01/13  5:00 AM      Result Value Range   WBC 10.7 (*) 4.0 - 10.5 K/uL   RBC 4.18  3.87 - 5.11 MIL/uL   Hemoglobin 13.9  12.0 - 15.0 g/dL   HCT 16.1  09.6 - 04.5 %   MCV 99.8  78.0 - 100.0 fL   MCH 33.3  26.0 - 34.0 pg   MCHC 33.3  30.0 - 36.0 g/dL   RDW 40.9  81.1 - 91.4 %   Platelets 189  150 - 400 K/uL   Neutrophils Relative % 71  43 - 77 %   Neutro Abs 7.6  1.7 -  7.7 K/uL   Lymphocytes Relative 19  12 - 46 %   Lymphs Abs 2.0  0.7 - 4.0 K/uL   Monocytes Relative 9  3 - 12 %   Monocytes Absolute 1.0  0.1 - 1.0 K/uL   Eosinophils Relative 1  0 - 5 %   Eosinophils Absolute 0.1  0.0 - 0.7 K/uL   Basophils Relative 0  0 - 1 %   Basophils Absolute 0.0  0.0 - 0.1 K/uL  COMPREHENSIVE METABOLIC PANEL     Status: Abnormal   Collection Time    07/01/13  5:00 AM      Result Value Range   Sodium 142  135 - 145 mEq/L   Potassium 3.5  3.5 - 5.1 mEq/L   Chloride 101  96 - 112 mEq/L   CO2 34 (*) 19 - 32 mEq/L   Glucose, Bld 132 (*) 70 - 99 mg/dL   BUN 17  6 - 23 mg/dL   Creatinine, Ser 7.82  0.50 - 1.10 mg/dL   Calcium 9.8  8.4 - 95.6 mg/dL   Total Protein 6.1  6.0 - 8.3 g/dL   Albumin 2.5 (*) 3.5 - 5.2 g/dL   AST 19  0 - 37 U/L   ALT 77 (*) 0 - 35 U/L   Alkaline Phosphatase 62  39 - 117 U/L  Total Bilirubin 0.4  0.3 - 1.2 mg/dL   GFR calc non Af Amer 84 (*) >90 mL/min   GFR calc Af Amer >90  >90 mL/min   Comment: (NOTE)     The eGFR has been calculated using the CKD EPI equation.     This calculation has not been validated in all clinical situations.     eGFR's persistently <90 mL/min signify possible Chronic Kidney     Disease.  GLUCOSE, CAPILLARY     Status: Abnormal   Collection Time    07/01/13  7:33 AM      Result Value Range   Glucose-Capillary 140 (*) 70 - 99 mg/dL   Comment 1 Notify RN    GLUCOSE, CAPILLARY     Status: Abnormal   Collection Time    07/01/13 11:42 AM      Result Value Range   Glucose-Capillary 261 (*) 70 - 99 mg/dL   Comment 1 Notify RN    GLUCOSE, CAPILLARY     Status: Abnormal   Collection Time    07/01/13  4:57 PM      Result Value Range   Glucose-Capillary 251 (*) 70 - 99 mg/dL   Comment 1 Notify RN    GLUCOSE, CAPILLARY     Status: Abnormal   Collection Time    07/01/13  8:14 PM      Result Value Range   Glucose-Capillary 213 (*) 70 - 99 mg/dL   Comment 1 Notify RN    GLUCOSE, CAPILLARY     Status:  Abnormal   Collection Time    07/02/13  9:00 AM      Result Value Range   Glucose-Capillary 237 (*) 70 - 99 mg/dL   Comment 1 Notify RN    GLUCOSE, CAPILLARY     Status: Abnormal   Collection Time    07/02/13 11:56 AM      Result Value Range   Glucose-Capillary 211 (*) 70 - 99 mg/dL   Comment 1 Notify RN    GLUCOSE, CAPILLARY     Status: Abnormal   Collection Time    07/02/13  4:28 PM      Result Value Range   Glucose-Capillary 312 (*) 70 - 99 mg/dL   Comment 1 Notify RN    GLUCOSE, CAPILLARY     Status: Abnormal   Collection Time    07/02/13  8:19 PM      Result Value Range   Glucose-Capillary 292 (*) 70 - 99 mg/dL   Comment 1 Notify RN    GLUCOSE, CAPILLARY     Status: Abnormal   Collection Time    07/03/13  7:29 AM      Result Value Range   Glucose-Capillary 161 (*) 70 - 99 mg/dL   Comment 1 Notify RN    GLUCOSE, CAPILLARY     Status: Abnormal   Collection Time    07/03/13 11:13 AM      Result Value Range   Glucose-Capillary 292 (*) 70 - 99 mg/dL   Comment 1 Notify RN       HEENT: normal Cardio: RRR and No murmur Resp: CTA B/L and Unlabored GI: BS positive and Nondistended Extremity:  Pulses positive and No Edema Skin:   Intact Neuro: Alert/Oriented, Cranial Nerve II-XII normal, Normal Sensory and Abnormal Motor 4+/5 bilateral deltoid, bicep, tricep, grip,   2 minus in the right hip flexor 3 minus knee extensor 2 minus right ankle dorsiflexor plantar flexor  3 minus left hip  flexor knee extensor ankle dorsiflexor plantar flexor Musc/Skel:  Other Atrophy in the right leg General no acute distress   Assessment/Plan: 1. Functional deficits secondary to  deconditioning after pulmonary embolism and DVT in a patient with chronic paraparesis  which require 3+ hours per day of interdisciplinary therapy in a comprehensive inpatient rehab setting. Physiatrist is providing close team supervision and 24 hour management of active medical problems listed below. Physiatrist  and rehab team continue to assess barriers to discharge/monitor patient progress toward functional and medical goals. FIM: FIM - Bathing Bathing Steps Patient Completed: Chest;Right Arm;Left Arm;Abdomen Bathing: 2: Max-Patient completes 3-4 42f 10 parts or 25-49%  FIM - Upper Body Dressing/Undressing Upper body dressing/undressing steps patient completed: Thread/unthread right sleeve of pullover shirt/dresss;Thread/unthread left sleeve of pullover shirt/dress;Put head through opening of pull over shirt/dress;Pull shirt over trunk Upper body dressing/undressing: 5: Set-up assist to: Obtain clothing/put away FIM - Lower Body Dressing/Undressing Lower body dressing/undressing: 1: Total-Patient completed less than 25% of tasks        FIM - Banker Devices: Bed rails Bed/Chair Transfer: 2: Sit > Supine: Max A (lifting assist/Pt. 25-49%);2: Supine > Sit: Max A (lifting assist/Pt. 25-49%);2: Chair or W/C > Bed: Max A (lift and lower assist);2: Bed > Chair or W/C: Max A (lift and lower assist)  FIM - Locomotion: Wheelchair Distance: 75' Locomotion: Wheelchair: 2: Travels 50 - 149 ft with minimal assistance (Pt.>75%) FIM - Locomotion: Ambulation Locomotion: Ambulation: 0: Activity did not occur (Pt non-ambulatory PTA)  Comprehension Comprehension Mode: Auditory Comprehension: 5-Understands basic 90% of the time/requires cueing < 10% of the time  Expression Expression Mode: Verbal Expression: 5-Expresses basic needs/ideas: With extra time/assistive device  Social Interaction Social Interaction: 6-Interacts appropriately with others with medication or extra time (anti-anxiety, antidepressant).  Problem Solving Problem Solving: 4-Solves basic 75 - 89% of the time/requires cueing 10 - 24% of the time  Memory Memory: 5-Recognizes or recalls 90% of the time/requires cueing < 10% of the time  Medical Problem List and Plan  1. 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 DVT Prophylaxis/Anticoagulation: Xarelto. Monitor for any bleeding episodes  3. Pain Management: Hydrocodone as needed. Monitor with increased mobility  4. Neuropsych: This patient is capable of making decisions on her own behalf.  5. Sarcoidosis. Continue chronic prednisone. Patient with pronounced bilateral lower extremity weakness  6. Diabetes mellitus with peripheral neuropathy.uncontrolled Hemoglobin A1c 7.4. Amaryl 4 mg daily. Check blood sugars a.c. and at bedtime , resume metformin 500mg  BID. This is a home meds. Creatinine is normal 7. Hypertension. Lopressor 25 mg twice a day. Monitor with increased mobility  8. Escherichia coli urinary tract infection. Changed rocephin to po ceftin as per IM D/C summ   LOS (Days) 3 A FACE TO FACE EVALUATION WAS PERFORMED  Alinda Egolf E 07/03/2013, 12:15 PM

## 2013-07-03 NOTE — Progress Notes (Signed)
Physical Therapy Session Note  Patient Details  Name: Elizabeth Drake MRN: [id number] Date of Birth: 08-12-1940  Today's Date: 07/03/2013 Time: Treatment Session 1: 1015-1110; Treatment Session 2: 1300-1330 Time Calculation (min): Treatment Session 1: 55 min; Treatment Session 2:  Short Term Goals: Week 1:  PT Short Term Goal 1 (Week 1): Pt to maintain postural control during static sitting >49min w/ min A PT Short Term Goal 2 (Week 1): Pt to maintain postural control during dynamic sitting w/ mod A  PT Short Term Goal 3 (Week 1): Pt to perform bed mobility w/ mod A PT Short Term Goal 4 (Week 1): Pt to transfer bed<>w/c w/ mod A  Skilled Therapeutic Interventions/Progress Updates:  Treatment Session 1:  1:1. Pt received semi-reclined in bed, awake and ready for therapy. Therapist reviewed daily schedule w/ pt and pt expressed concern regarding completion of ADLs w/ female OT. Pt verbalized preference for female OT, therapist let scheduler know. Focus this session on completion of ADLs w/ pt at bed level due to poor trunk control. Pt req min-mod A for management of clothing and (S)-min A for bathing at bed level and grooming at sink, min cueing overall.  Max A for t/f sup>sit w/ HOB slightly elevated, use of bed rails and pt self assisting B LE, squat pivot t/f bed>w/c w/ min cueing for technique and safety. Therapist modifying w/c at end of session for improved positioning and safety. Pt sitting in w/c at end of session w/ all needs in reach.   Treatment Session 2:  1:1. Pt received semi-reclined in bed, agreeable to therapy but feeling very fatigued. Focus this session on supine therex for strengthening in neck, core and B LE in room for energy conservation due to OT session directly after completion of this session. Exercises included 2x10: chin tucks, shoulder protraction, shoulder depression, glute sets, bridging, rotation of B LE in hooklying, hip add in hooklying w/ pillow between legs and  SAQ (increased assist for R LE vs. L LE). Pt req verbal/tactile cues for technique. Brief discussion this session regarding d/c planning and anticipation that pt will req assistance at time of d/c. Pt unrealistic regarding belief that she will be at mod(I) level at time of d/c. Pt further expressed that if she will req assistance at time of d/c that she would like to hire someone to help vs. "burdening her family." Will make SW aware. Pt semi-reclined in bed at end of session w/ all needs in reach and bed alarm on.   Therapy Documentation Precautions:  Precautions Precautions: Fall Restrictions Weight Bearing Restrictions: No   Pain: Pain Assessment Pain Score: 3   See FIM for current functional status  Therapy/Group: Individual Therapy  Denzil Hughes 07/03/2013, 12:08 PM

## 2013-07-03 NOTE — Progress Notes (Signed)
Occupational Therapy Session Note  Patient Details  Name: Elizabeth Drake MRN: [id number] Date of Birth: Sep 04, 1940  Today's Date: 07/03/2013 Time: 1115-1200 Time Calculation (min): 45 min  Short Term Goals: Week 1:  OT Short Term Goal 1 (Week 1): Bath:  Mod assist with bath to include lateral leans for peri area and buttocks OT Short Term Goal 2 (Week 1): LB Dressing:  Mod assist to include lateral leans OT Short Term Goal 3 (Week 1): Toilet Transfer: Mod assist with squat pivot OT Short Term Goal 4 (Week 1): Toileting:  Mod assist with 2/3 tasks to include lateral leans  Skilled Therapeutic Interventions: ADL-retraining with emphasis on seated grooming (hair care).   Patient completed task with min assist to manage shampoo cap and apply hair spray, setup assist to provide grooming supplies and position mirror, and extra time to dry and curl her hair.     Patient remained in w/c at end of session in prep for lunch with call light and phone within reach.    Therapy Documentation Precautions:  Precautions Precautions: Fall Restrictions Weight Bearing Restrictions: No  Vital Signs: Therapy Vitals Temp: 97.7 F (36.5 C) Temp src: Oral Pulse Rate: 82 BP: 132/66 mmHg Patient Position, if appropriate: Lying Oxygen Therapy SpO2: 98 %  Pain: Pain Assessment Pain Assessment: 0-10 Pain Score: 5  Pain Type: Acute pain Pain Location: Back Patients Stated Pain Goal: 3 Pain Intervention(s): Medication (See eMAR)  See FIM for current functional status  Therapy/Group: Individual Therapy  Second session: Time: 1330-1445 Time Calculation (min):  75 min  Pain Assessment: No pain  Skilled Therapeutic Interventions: Therapeutic exercises ( 15 min) with emphasis on improved bil U/LE strength, endurance and dynamic sitting balance.  Patient completed 100 steps using NuStep and min assist to advance and maintain correct alignment of right leg while performing exercises.    Therapeutic  activities with emphasis on re-ed and management of personal DME (w/c).   Patient maintains possession of her personal w/c while on rehab floor and has attempted use of facility w/c which includes "Vonna Kotyk" upper torso support.   After repeated attempts at learning to use desk length flip-back armrests and brake mechanism of facility w/c, patient returned to use of her personal wheelchair after learning that her chair also has full-length flip-back armrests as well.  Patient was also educated on use of dicem to reduce movement of w/c cushion and on the location and adjustment of w/c brakes and footrests, which were loose on right side.   Patient reported fatigue throughout session and required 4 rest breaks following repeated transfers from w/c <> NuStep, w/c <> w/c, and w/c > bed.   Patient required max assist at end of session to recover to supine in bed and max assist for bed mobility toward head of bed.  See FIM for current functional status  Therapy/Group: Individual Therapy  Aniella Wandrey 07/03/2013, 3:44 PM

## 2013-07-04 ENCOUNTER — Inpatient Hospital Stay (HOSPITAL_COMMUNITY): Payer: Medicare Other | Admitting: *Deleted

## 2013-07-04 ENCOUNTER — Inpatient Hospital Stay (HOSPITAL_COMMUNITY): Payer: Medicare Other

## 2013-07-04 LAB — GLUCOSE, CAPILLARY: Glucose-Capillary: 226 mg/dL — ABNORMAL HIGH (ref 70–99)

## 2013-07-04 NOTE — Progress Notes (Signed)
[facility] Individual Statement of Services  Patient Name:  Elizabeth Drake  Date:  07/01/2013  Welcome to the [facility].  Our goal is to provide you with an individualized program based on your diagnosis and situation, designed to meet your specific needs.  With this comprehensive rehabilitation program, you will be expected to participate in at least 3 hours of rehabilitation therapies Monday-Friday, with modified therapy programming on the weekends.  Your rehabilitation program will include the following services:  Physical Therapy (PT), Occupational Therapy (OT), Speech Therapy (ST), 24 hour per day rehabilitation nursing, Therapeutic Recreaction (TR), Case Management (Social Worker), Rehabilitation Medicine, Nutrition Services and Pharmacy Services  Weekly team conferences will be held on Tuesdays to discuss your progress.  Your Social Worker will talk with you frequently to get your input and to update you on team discussions.  Team conferences with you and your family in attendance may also be held.  Expected length of stay: 10-14 days  Overall anticipated outcome: Mod independent w/c with some                                                                                                                         minimal assist  Depending on your progress and recovery, your program may change. Your Social Worker will coordinate services and will keep you informed of any changes. Your Social Worker's name and contact numbers are listed  below.  The following services may also be recommended but are not provided by the [facility]:   Driving Evaluations  Home Health Rehabiltiation Services  Outpatient Rehabilitation Services    Arrangements will be made to provide these services after discharge if needed.  Arrangements include referral to agencies that provide these services.  Your insurance has been verified to be:   Medicare and [employer] Your primary doctor is:  Dr. Rudi Heap  Pertinent information will be shared with your doctor and your insurance company.  Social Worker:  [place], [place] [phone] or ([phone]   Information discussed with and copy given to patient by: Amada Jupiter, 07/01/2013, 4:22 PM

## 2013-07-04 NOTE — Progress Notes (Signed)
Social Work  Social Work Assessment and Plan  Patient Details  Name: Elizabeth Drake MRN: [id number] Date of Birth: 01-03-1941  Today's Date: 07/01/2013  Problem List:  Patient Active Problem List   Diagnosis Date Noted  . Physical deconditioning 06/30/2013  . DKA, type 2 06/25/2013  . Pulmonary embolism 06/24/2013  . PE (pulmonary thromboembolism) 06/24/2013  . Hypoxemia 06/24/2013  . Tachycardia 06/24/2013  . Cellulitis of foot without toes, right 06/24/2013  . DM type 2 (diabetes mellitus, type 2) 04/25/2013  . OAB (overactive bladder) 04/25/2013  . Cellulitis of toe of left foot with ulcer 04/25/2013  . Leukocytosis, unspecified 04/25/2013  . 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 Wheelchair bound 04/25/2013  . UTI (lower urinary tract infection) 04/25/2013  . Osteoporosis, unspecified 04/25/2013  . Chronic back pain 04/25/2013  . Chronic leg pain 04/25/2013  . OA (osteoarthritis) 04/25/2013  . Hyperlipidemia 12/31/2012  . Sarcoidosis 12/31/2012   Past Medical History:  Past Medical History  Diagnosis Date  . Sarcoidosis   . Hiatal hernia   . Diabetes mellitus without complication   . Osteoporosis    Past Surgical History:  Past Surgical History  Procedure Laterality Date  . Abdominal hysterectomy    . Joint replacement      toe  . Lung lobectomy Left     Lower   Social History:  reports that she quit smoking about 12 years ago. Her smoking use included Cigarettes. She smoked 0.00 packs per day. She does not have any smokeless tobacco history on file. She reports that she does not drink alcohol or use illicit drugs.  Family / Support Systems Marital Status: Married How Long?: 55 yrs Patient Roles: Spouse;Parent Spouse/Significant Other: spouse, Elizabeth Drake") @ [phone] Children: 3 adult sons:  son, Deniece Portela, @ SNF for several years due to deficts from a CVA he suffered at age 41;  son, Moshe Cipro, in [place] and son, Mellody Dance, living locally and staying often with  parents Anticipated Caregiver: spouse Ability/Limitations of Caregiver: supervision to min assist Caregiver Availability: 24/7 Family Dynamics: pt notes her sons are very supportive and will help her husband in providing any needed assistance.  Social History Preferred language: English Religion: Baptist Cultural Background: NA Education: HS Read: Yes Write: Yes Employment Status: Retired Fish farm manager Issues: none Guardian/Conservator: none - pt capable of making decision on her own behalf   Abuse/Neglect Physical Abuse: Denies Verbal Abuse: Denies Sexual Abuse: Denies Exploitation of patient/patient's resources: Denies Self-Neglect: Denies  Emotional Status Pt's affect, behavior adn adjustment status: pt very pleasant, fully oriented.  Admits much frustration with her level of physical deconditioning and concern about whether she will be able to regain prior level of function.  Denies any significant emotional distress - will monitor.  No s/s of depression and no formal screen warranted at this point. Recent Psychosocial Issues: None Pyschiatric History: None Substance Abuse History: None  Patient / Family Perceptions, Expectations & Goals Pt/Family understanding of illness & functional limitations: pt with basic understanding of her current level of physical deconditioning and need for CIR.  Admits she is unceratin of how much improvement to expect. Premorbid pt/family roles/activities: Independent at w/c level; limited community level activities Anticipated changes in roles/activities/participation: may require more assistance than husband can provide - will monitor.   Pt/family expectations/goals: "I need to be able to get around on my own...don't know how I'm gonna get there..."  [employer]: None Premorbid Home Care/DME Agencies: None Transportation available at discharge:  yes  Discharge Planning Living Arrangements:  Spouse/significant other;Other (Comment) (spouse and son in the home) Support Systems: Spouse/significant other;Children Type of Residence: Private residence Insurance Resources: Medicare;Private Insurance (specify) ([employer] manager) Surveyor, quantity Resources: Social Security Financial Screen Referred: No Living Expenses: Own Money Management: Spouse Does the patient have any problems obtaining your medications?: No Home Management: spouse handles finances and they share light housekeeping and meals.  also has a houskeeper q other week Patient/Family Preliminary Plans: Pt to d/c home with husband who can provide light physical assist Social Work Anticipated Follow Up Needs: HH/OP Expected length of stay: ELOS 12 to 14 days  Clinical Impression Oriented elderly woman who is here following significant deconditioning due to PE and DVTs.  Was independent at a w/c level PTA. Very concerned that she will not make significant gains here on CIR.  Will monitor mood and assist with any d/c planning needs.  Nijel Flink 07/01/2013, 4:12 PM

## 2013-07-04 NOTE — Progress Notes (Addendum)
Subjective/Complaints: AALINA BREGE is a 72 y.o. 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 Patient recently treated for third toe cellulitis right foot. Mrs. Keadle is wheelchair-bound prior to admission independent with transfers no ambulation. Admitted 06/24/2013 with shortness of breath and chest heaviness that worsened with minimal exertion. Troponin and cardiac enzymes negative. CT angiogram chest showed extensive pulmonary embolus with evidence of right ventricular strain. Noted pulmonary emboli involve significant portions of both right and left main pulmonary arteries. Patient placed on intravenous heparin and transitioned to Xarelto. Venous Doppler studies lower extremities 06/25/2013 with findings consistent of acute DVT distal femoral-popliteal vein of left lower extremity  Slept poorly. Still has family and would like to try putting. Constipated  Review of Systems - Negative except Weakness in the legs also see above  Objective: Vital Signs: Blood pressure 153/75, pulse 82, temperature 97.6 F (36.4 C), temperature source Oral, resp. rate 18, height 5\' 6"  (1.676 m), weight 51.5 kg (113 lb 8.6 oz), SpO2 95.00%. No results found. Results for orders placed during the hospital encounter of 06/30/13 (from the past 72 hour(s))  GLUCOSE, CAPILLARY     Status: Abnormal   Collection Time    07/01/13 11:42 AM      Result Value Range   Glucose-Capillary 261 (*) 70 - 99 mg/dL   Comment 1 Notify RN    GLUCOSE, CAPILLARY     Status: Abnormal   Collection Time    07/01/13  4:57 PM      Result Value Range   Glucose-Capillary 251 (*) 70 - 99 mg/dL   Comment 1 Notify RN    GLUCOSE, CAPILLARY     Status: Abnormal   Collection Time    07/01/13  8:14 PM      Result Value Range   Glucose-Capillary 213 (*) 70 - 99 mg/dL   Comment 1 Notify RN    GLUCOSE, CAPILLARY     Status: Abnormal   Collection Time    07/02/13   9:00 AM      Result Value Range   Glucose-Capillary 237 (*) 70 - 99 mg/dL   Comment 1 Notify RN    GLUCOSE, CAPILLARY     Status: Abnormal   Collection Time    07/02/13 11:56 AM      Result Value Range   Glucose-Capillary 211 (*) 70 - 99 mg/dL   Comment 1 Notify RN    GLUCOSE, CAPILLARY     Status: Abnormal   Collection Time    07/02/13  4:28 PM      Result Value Range   Glucose-Capillary 312 (*) 70 - 99 mg/dL   Comment 1 Notify RN    GLUCOSE, CAPILLARY     Status: Abnormal   Collection Time    07/02/13  8:19 PM      Result Value Range   Glucose-Capillary 292 (*) 70 - 99 mg/dL   Comment 1 Notify RN    GLUCOSE, CAPILLARY     Status: Abnormal   Collection Time    07/03/13  7:29 AM      Result Value Range   Glucose-Capillary 161 (*) 70 - 99 mg/dL   Comment 1 Notify RN    GLUCOSE, CAPILLARY     Status: Abnormal   Collection Time    07/03/13 11:13 AM      Result Value Range   Glucose-Capillary 292 (*) 70 - 99 mg/dL   Comment 1 Notify RN  GLUCOSE, CAPILLARY     Status: Abnormal   Collection Time    07/03/13  4:26 PM      Result Value Range   Glucose-Capillary 298 (*) 70 - 99 mg/dL   Comment 1 Notify RN    GLUCOSE, CAPILLARY     Status: Abnormal   Collection Time    07/03/13  8:59 PM      Result Value Range   Glucose-Capillary 253 (*) 70 - 99 mg/dL   Comment 1 Notify RN    GLUCOSE, CAPILLARY     Status: None   Collection Time    07/04/13  7:36 AM      Result Value Range   Glucose-Capillary 96  70 - 99 mg/dL     HEENT: normal Cardio: RRR and No murmur Resp: CTA B/L and Unlabored GI: BS positive and Nondistended Extremity:  Pulses positive and No Edema Skin:   Intact Neuro: Alert/Oriented, Cranial Nerve II-XII normal, Normal Sensory and Abnormal Motor 4+/5 bilateral deltoid, bicep, tricep, grip,   2 minus in the right hip flexor 3 minus knee extensor 2 minus right ankle dorsiflexor plantar flexor  3 minus left hip flexor knee extensor ankle dorsiflexor plantar  flexor Musc/Skel:  Other Atrophy in the right leg General no acute distress   Assessment/Plan: 1. Functional deficits secondary to  deconditioning after pulmonary embolism and DVT in a patient with chronic paraparesis  which require 3+ hours per day of interdisciplinary therapy in a comprehensive inpatient rehab setting. Physiatrist is providing close team supervision and 24 hour management of active medical problems listed below. Physiatrist and rehab team continue to assess barriers to discharge/monitor patient progress toward functional and medical goals. FIM: FIM - Bathing Bathing Steps Patient Completed: Chest;Right Arm;Left Arm;Abdomen;Front perineal area;Right lower leg (including foot);Left upper leg;Right upper leg;Left lower leg (including foot) Bathing: 2: Max-Patient completes 3-4 43f 10 parts or 25-49%  FIM - Upper Body Dressing/Undressing Upper body dressing/undressing steps patient completed: Thread/unthread right sleeve of pullover shirt/dresss;Thread/unthread left sleeve of pullover shirt/dress;Put head through opening of pull over shirt/dress;Pull shirt over trunk Upper body dressing/undressing: 4: Min-Patient completed 75 plus % of tasks FIM - Lower Body Dressing/Undressing Lower body dressing/undressing steps patient completed: Pull pants up/down;Pull underwear up/down;Thread/unthread left pants leg;Thread/unthread left underwear leg;Thread/unthread right pants leg;Thread/unthread right underwear leg;Don/Doff left sock;Don/Doff right sock Lower body dressing/undressing: 3: Mod-Patient completed 50-74% of tasks        FIM - Press photographer Assistive Devices: Bed rails;Arm rests;HOB elevated Bed/Chair Transfer: 2: Sit > Supine: Max A (lifting assist/Pt. 25-49%);2: Supine > Sit: Max A (lifting assist/Pt. 25-49%);2: Chair or W/C > Bed: Max A (lift and lower assist);2: Bed > Chair or W/C: Max A (lift and lower assist)  FIM - Locomotion:  Wheelchair Distance: 100' Locomotion: Wheelchair: 0: Activity did not occur FIM - Locomotion: Ambulation Locomotion: Ambulation: 0: Activity did not occur  Comprehension Comprehension Mode: Auditory Comprehension: 6-Follows complex conversation/direction: With extra time/assistive device  Expression Expression Mode: Verbal Expression: 5-Expresses complex 90% of the time/cues < 10% of the time  Social Interaction Social Interaction: 6-Interacts appropriately with others with medication or extra time (anti-anxiety, antidepressant).  Problem Solving Problem Solving: 5-Solves basic 90% of the time/requires cueing < 10% of the time  Memory Memory: 5-Recognizes or recalls 90% of the time/requires cueing < 10% of the time  Medical Problem List and Plan  1. 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 DVT Prophylaxis/Anticoagulation: Xarelto.  Monitor for any bleeding episodes  3. Pain Management: Hydrocodone as needed. Monitor with increased mobility  4. Neuropsych: This patient is capable of making decisions on her own behalf.  5. Sarcoidosis. Continue chronic prednisone. Patient with pronounced bilateral lower extremity weakness  6. Diabetes mellitus with peripheral neuropathy.uncontrolled Hemoglobin A1c 7.4. Amaryl 4 mg daily. Check blood sugars a.c. and at bedtime , resume metformin 500mg  BID. This is a home meds. Creatinine is normal 7. Hypertension. Lopressor 25 mg twice a day. Monitor with increased mobility  8. Escherichia coli urinary tract infection. Changed rocephin to po ceftin as per IM D/C summ 9. Urinary retention continue Flomax, discontinue Enablex  LOS (Days) 4 A FACE TO FACE EVALUATION WAS PERFORMED  KIRSTEINS,ANDREW E 07/04/2013, 10:51 AM

## 2013-07-04 NOTE — Progress Notes (Signed)
Occupational Therapy Session Note  Patient Details  Name: Elizabeth Drake MRN: [id number] Date of Birth: 1941-04-08  Today's Date: 07/04/2013 Time: 1000-1048 and 1430-1500 Time Calculation (min): 48 min and 30 min   Short Term Goals: Week 1:  OT Short Term Goal 1 (Week 1): Bath:  Mod assist with bath to include lateral leans for peri area and buttocks OT Short Term Goal 2 (Week 1): LB Dressing:  Mod assist to include lateral leans OT Short Term Goal 3 (Week 1): Toilet Transfer: Mod assist with squat pivot OT Short Term Goal 4 (Week 1): Toileting:  Mod assist with 2/3 tasks to include lateral leans  Skilled Therapeutic Interventions/Progress Updates:    Session 1: Pt seen for ADL retraining with focus on activity tolerance, bed mobility, functional transfers, and techniques to facilitate increase in independence with self-care tasks. Pt received sitting in w/c requesting to return to bed d/t fatigue. Pt agreeable for therapy at this time. Completed sponge bath at sink with frequent rest breaks. Attempted sit<>stand and pt completed 1 with max assist and sustained standing for approx 4-5 seconds. Pt declining standing anymore and requesting to finish LB self-care tasks in bed. Completed squat pivot transfer to right with max assist and sit>supine max assist. Engaged in peri hygiene and pt completed via rolling with increased time and min assist when rolling to left and supervision with cues to flex LLE when rolling to right. Required assist for threading BLE into pants then pt used rolling and bridging technique to manage around waist. Pt very fatigued at end of session and left supine in bed with HOB elevated.   Session 2: Pt received supine on mat table after PT session and very fatigue. Completed UE exercises using yellow theraband while supine on mat table to increase strength for sit>stand and scooting. Engaged in BLE exercises on mat table via leg raises and flexion/extension with pt fatiguing  quickly. Practiced bed mobility with supine<>sit with mod assist x2 trials each. At end of session pt requiring max assist for squat pivot transfer mat table>w/c then mod assist squat pivot w/c>bed. Pt left supine in bed with nursing tech present.   Therapy Documentation Precautions:  Precautions Precautions: Fall Restrictions Weight Bearing Restrictions: No General: General Amount of Missed OT Time (min): 12 Minutes Vital Signs: Therapy Vitals Pulse Rate: 82 BP: 153/75 mmHg SpO2>96% throughout session.  Pain: Pt reporting no pain during therapy sessions.   See FIM for current functional status  Therapy/Group: Individual Therapy  Daneil Dan 07/04/2013, 10:54 AM

## 2013-07-04 NOTE — Plan of Care (Signed)
Problem: RH BLADDER ELIMINATION Goal: RH STG MANAGE BLADDER WITH ASSISTANCE STG Manage Bladder With mod Assistance  Outcome: Not Progressing Requires cathing at this time

## 2013-07-04 NOTE — Progress Notes (Signed)
Physical Therapy Session Note  Patient Details  Name: Elizabeth Drake MRN: [id number] Date of Birth: January 19, 1941  Today's Date: 07/04/2013 Time: 0803-0900 and 4098-1191 Time Calculation (min): 57 min and 45 min  Short Term Goals: Week 1:  PT Short Term Goal 1 (Week 1): Pt to maintain postural control during static sitting >61min w/ min A PT Short Term Goal 2 (Week 1): Pt to maintain postural control during dynamic sitting w/ mod A  PT Short Term Goal 3 (Week 1): Pt to perform bed mobility w/ mod A PT Short Term Goal 4 (Week 1): Pt to transfer bed<>w/c w/ mod A  Skilled Therapeutic Interventions/Progress Updates:    AM Session: Patient received supine in bed with HOB elevated, finishing breakfast. Session focused on wheelchair mobility, functional transfers, postural control, and dynamic sitting balance; see details below.. Patient supine>sit with HOB elevated and use of bedrails with modA. Patient transfers bed>wheelchair (going to her L) with maxA via squat pivot. Sitting in wheelchair, patient brushed teeth and combed hair with emphasis on upright posture, using mirror for visual feedback. Patient positioned supine on mat with wedge and pillow to elevate head and shoulders. Passive stretching to B pecs in this position, returned to this position to perform stretching in between trials of dynamic sitting activity. Patient performed anterior/posterior pelvic tilt/weight shifting activity with patient's B UEs on therapist's shoulders, therapist seated in front of patient. Manual facilitation for anterior/posterior pelvic tilt for muscle re-education and for increased weight shifting to improve pelvic mobility. Patient left sitting in wheelchair with pillows for positioning to assist midline orientation. Patient insturcted to call for assistance if she is not tolerating wheelchair or has difficulty breathing. RN notified of patient status and verbalizes she will keep an eye at her.  PM Session: Patient  receive supine in bed with HOB elevated. Session focused on functional transfers, postural control, and B scapular strengthening. Patient supine>sit with HOB elevated and use of bedrails with modA. Patient transfers bed>wheelchair (going to her L) with maxA via squat pivot. Assisted patient with doffing non skid socks and donning regular socks and shoes. Seated in wheelchair with arm rests removed, patient performed B scapular retractions with emphasis on static hold in retraction. Patient demonstrates good muscle recruitment with this exercise. Progressed patient to perform rows with use of theraband for increased tactile feedback. Tactile cues between B scaps to maximize retraction. Patient able to hold 3-5" with each repetition and instructed to "stick chest out" and "look up" to improve upright posture. Patient able to maintain scapular retraction longer than upright posture. Patient transferred wheelchair>mat (going L) via squat pivot and modA, maxA sit>supine. Patient left supine on mat with wedge for positioning, awaiting OT session.  Noted patient with improved ability to assist with transfers when cued to push through B UEs "like you are scooting". Patient with improved ability to clear buttocks when doing so. Added Vonna Kotyk back to wheelchair for increased/firmer back support with minimal lateral support. Patient reports comfort with wheelchair back, but requires increased lateral supports due to scoliotic posture.  Therapy Documentation Precautions:  Precautions Precautions: Fall Restrictions Weight Bearing Restrictions: No Pain: Pain Assessment Pain Assessment: No/denies pain Pain Score: 0-No pain Locomotion : Naval architect Mobility: Yes Wheelchair Assistance: 5: Financial planner Details: Verbal cues for Copy: Both upper extremities Wheelchair Parts Management: Needs assistance Distance: 100'  Balance: Dynamic Sitting  Balance Dynamic Sitting - Balance Support: Left upper extremity supported;Feet supported Dynamic Sitting - Level of Assistance:  4: Min assist Dynamic Sitting - Balance Activities: Lateral lean/weight shifting Dynamic Sitting - Comments: Patient performed dynamic sitting balance activity with emphasis on L trunk elongation/R trunk shortening to improve postural control in sitting. Patient positioned with numbered discs under R buttocks. Patient instructed to shift weight to the L, with manual facilitation for L trunk elongation, to unweight R buttocks and remove one disc at a time. Patient performed 3 trials x6 discs, instruction to return to upright sitting in between each repeition.  See FIM for current functional status  Therapy/Group: Individual Therapy  Chipper Herb. Khali Perella, PT, DPT  07/04/2013, 11:58 AM

## 2013-07-05 LAB — GLUCOSE, CAPILLARY
Glucose-Capillary: 93 mg/dL (ref 70–99)
Glucose-Capillary: 264 mg/dL — ABNORMAL HIGH (ref 70–99)
Glucose-Capillary: 212 mg/dL — ABNORMAL HIGH (ref 70–99)

## 2013-07-05 MED ORDER — SENNOSIDES-DOCUSATE SODIUM 8.6-50 MG PO TABS
2.0000 | ORAL_TABLET | Freq: Two times a day (BID) | ORAL | Status: DC
  Administered 2013-07-05 – 2013-07-17 (×20): 2 via ORAL
  Filled 2013-07-05 (×20): qty 2
  Filled 2013-07-05: qty 1

## 2013-07-05 NOTE — Progress Notes (Signed)
Subjective/Complaints: Elizabeth Drake is a 72 y.o. 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 Patient recently treated for third toe cellulitis right foot. Elizabeth Drake is wheelchair-bound prior to admission independent with transfers no ambulation. Admitted 06/24/2013 with shortness of breath and chest heaviness that worsened with minimal exertion. Troponin and cardiac enzymes negative. CT angiogram chest showed extensive pulmonary embolus with evidence of right ventricular strain. Noted pulmonary emboli involve significant portions of both right and left main pulmonary arteries. Patient placed on intravenous heparin and transitioned to Xarelto. Venous Doppler studies lower extremities 06/25/2013 with findings consistent of acute DVT distal femoral-popliteal vein of left lower extremity   Constipated took vicodin QID at home now Q 4.  Used dulcolax supp q wk at home  Review of Systems - Negative except Weakness in the legs also see above  Objective: Vital Signs: Blood pressure 133/77, pulse 95, temperature 96.7 F (35.9 C), temperature source Oral, resp. rate 16, height 5\' 6"  (1.676 m), weight 51 kg (112 lb 7 oz), SpO2 95.00%. No results found. Results for orders placed during the hospital encounter of 06/30/13 (from the past 72 hour(s))  GLUCOSE, CAPILLARY     Status: Abnormal   Collection Time    07/02/13 11:56 AM      Result Value Range   Glucose-Capillary 211 (*) 70 - 99 mg/dL   Comment 1 Notify RN    GLUCOSE, CAPILLARY     Status: Abnormal   Collection Time    07/02/13  4:28 PM      Result Value Range   Glucose-Capillary 312 (*) 70 - 99 mg/dL   Comment 1 Notify RN    GLUCOSE, CAPILLARY     Status: Abnormal   Collection Time    07/02/13  8:19 PM      Result Value Range   Glucose-Capillary 292 (*) 70 - 99 mg/dL   Comment 1 Notify RN    GLUCOSE, CAPILLARY     Status: Abnormal   Collection Time     07/03/13  7:29 AM      Result Value Range   Glucose-Capillary 161 (*) 70 - 99 mg/dL   Comment 1 Notify RN    GLUCOSE, CAPILLARY     Status: Abnormal   Collection Time    07/03/13 11:13 AM      Result Value Range   Glucose-Capillary 292 (*) 70 - 99 mg/dL   Comment 1 Notify RN    GLUCOSE, CAPILLARY     Status: Abnormal   Collection Time    07/03/13  4:26 PM      Result Value Range   Glucose-Capillary 298 (*) 70 - 99 mg/dL   Comment 1 Notify RN    GLUCOSE, CAPILLARY     Status: Abnormal   Collection Time    07/03/13  8:59 PM      Result Value Range   Glucose-Capillary 253 (*) 70 - 99 mg/dL   Comment 1 Notify RN    GLUCOSE, CAPILLARY     Status: None   Collection Time    07/04/13  7:36 AM      Result Value Range   Glucose-Capillary 96  70 - 99 mg/dL  GLUCOSE, CAPILLARY     Status: Abnormal   Collection Time    07/04/13 11:35 AM      Result Value Range   Glucose-Capillary 219 (*) 70 - 99 mg/dL   Comment 1 Notify RN    GLUCOSE,  CAPILLARY     Status: Abnormal   Collection Time    07/04/13  4:50 PM      Result Value Range   Glucose-Capillary 226 (*) 70 - 99 mg/dL   Comment 1 Notify RN    GLUCOSE, CAPILLARY     Status: Abnormal   Collection Time    07/04/13  9:12 PM      Result Value Range   Glucose-Capillary 181 (*) 70 - 99 mg/dL  GLUCOSE, CAPILLARY     Status: None   Collection Time    07/05/13  7:31 AM      Result Value Range   Glucose-Capillary 93  70 - 99 mg/dL   Comment 1 Notify RN       HEENT: normal Cardio: RRR and No murmur Resp: CTA B/L and Unlabored GI: BS positive and Nondistended Extremity:  Pulses positive and No Edema Skin:   Intact Neuro: Alert/Oriented, Cranial Nerve II-XII normal, Normal Sensory and Abnormal Motor 4+/5 bilateral deltoid, bicep, tricep, grip,   2 minus in the right hip flexor 3 minus knee extensor 2 minus right ankle dorsiflexor plantar flexor  3 minus left hip flexor knee extensor ankle dorsiflexor plantar flexor Musc/Skel:  Other  Atrophy in the right leg General no acute distress   Assessment/Plan: 1. Functional deficits secondary to  deconditioning after pulmonary embolism and DVT in a patient with chronic paraparesis  which require 3+ hours per day of interdisciplinary therapy in a comprehensive inpatient rehab setting. Physiatrist is providing close team supervision and 24 hour management of active medical problems listed below. Physiatrist and rehab team continue to assess barriers to discharge/monitor patient progress toward functional and medical goals. FIM: FIM - Bathing Bathing Steps Patient Completed: Chest;Right Arm;Left Arm;Abdomen;Front perineal area;Left upper leg;Right upper leg;Buttocks (completed LB via rolling in bed secondary to fatigue) Bathing: 4: Min-Patient completes 8-9 11f 10 parts or 75+ percent  FIM - Upper Body Dressing/Undressing Upper body dressing/undressing steps patient completed: Thread/unthread left sleeve of pullover shirt/dress;Put head through opening of pull over shirt/dress;Pull shirt over trunk Upper body dressing/undressing: 4: Min-Patient completed 75 plus % of tasks FIM - Lower Body Dressing/Undressing Lower body dressing/undressing steps patient completed: Pull pants up/down Lower body dressing/undressing: 2: Max-Patient completed 25-49% of tasks  FIM - Toileting Toileting: 0: Activity did not occur  FIM - Archivist Transfers: 0-Activity did not occur  FIM - Banker Devices: Arm rests;Bed rails Bed/Chair Transfer: 2: Chair or W/C > Bed: Max A (lift and lower assist);3: Sit > Supine: Mod A (lifting assist/Pt. 50-74%/lift 2 legs);3: Supine > Sit: Mod A (lifting assist/Pt. 50-74%/lift 2 legs;2: Bed > Chair or W/C: Max A (lift and lower assist)  FIM - Locomotion: Wheelchair Distance: 100 Locomotion: Wheelchair: 0: Activity did not occur FIM - Locomotion: Ambulation Ambulation/Gait Assistance: Not tested  (comment) Locomotion: Ambulation: 0: Activity did not occur  Comprehension Comprehension Mode: Auditory Comprehension: 6-Follows complex conversation/direction: With extra time/assistive device  Expression Expression Mode: Verbal Expression: 5-Expresses complex 90% of the time/cues < 10% of the time  Social Interaction Social Interaction: 6-Interacts appropriately with others with medication or extra time (anti-anxiety, antidepressant).  Problem Solving Problem Solving: 5-Solves complex 90% of the time/cues < 10% of the time  Memory Memory: 5-Recognizes or recalls 90% of the time/requires cueing < 10% of the time  Medical Problem List and Plan  1. 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 DVT Prophylaxis/Anticoagulation: Xarelto.  Monitor for any bleeding episodes  3. Pain Management: Hydrocodone as needed. Monitor with increased mobility  4. Neuropsych: This patient is capable of making decisions on her own behalf.  5. Sarcoidosis. Continue chronic prednisone. Patient with pronounced bilateral lower extremity weakness  6. Diabetes mellitus with peripheral neuropathy.uncontrolled Hemoglobin A1c 7.4. Amaryl 4 mg daily. Check blood sugars a.c. and at bedtime , resume metformin 500mg  BID. This is a home meds. Creatinine is normal 7. Hypertension. Lopressor 25 mg twice a day. Monitor with increased mobility  8. Escherichia coli urinary tract infection. Changed rocephin to po ceftin as per IM D/C summ 9. Urinary retention continue Flomax, discontinue Enablex  LOS (Days) 5 A FACE TO FACE EVALUATION WAS PERFORMED  Edenilson Austad E 07/05/2013, 9:29 AM

## 2013-07-05 NOTE — Progress Notes (Signed)
Pt c/o increased back pain today; states H/O; Vicodin x 3 today, K-Pad obtained, turned and repositioned, monitor.

## 2013-07-05 NOTE — Progress Notes (Signed)
Pt requiring I/O caths At 1200 noon, assisted to BR, attempt to void, unable , cathed for 400cc (10 hr.pd) 1830 pt incontinent SMALL amt urine, to BR, no further void, I/O cathed for 500cc. Poor appetite, taking po fluids well.

## 2013-07-06 ENCOUNTER — Encounter (HOSPITAL_COMMUNITY): Payer: No Typology Code available for payment source | Admitting: Occupational Therapy

## 2013-07-06 ENCOUNTER — Inpatient Hospital Stay (HOSPITAL_COMMUNITY): Payer: Medicare Other

## 2013-07-06 ENCOUNTER — Inpatient Hospital Stay (HOSPITAL_COMMUNITY): Payer: No Typology Code available for payment source

## 2013-07-06 DIAGNOSIS — D869 Sarcoidosis, unspecified: Secondary | ICD-10-CM

## 2013-07-06 DIAGNOSIS — R5381 Other malaise: Secondary | ICD-10-CM

## 2013-07-06 DIAGNOSIS — G8220 Paraplegia, unspecified: Secondary | ICD-10-CM

## 2013-07-06 LAB — GLUCOSE, CAPILLARY
Glucose-Capillary: 89 mg/dL (ref 70–99)
Glucose-Capillary: 277 mg/dL — ABNORMAL HIGH (ref 70–99)
Glucose-Capillary: 251 mg/dL — ABNORMAL HIGH (ref 70–99)

## 2013-07-06 MED ORDER — FLEET ENEMA 7-19 GM/118ML RE ENEM: 1.0000 | ENEMA | Freq: Every day | RECTAL | Status: DC | PRN

## 2013-07-06 NOTE — Progress Notes (Signed)
Occupational Therapy Note  Patient Details  Name: DAVEY BERGSMA MRN: [id number] Date of Birth: 1941-07-17 Today's Date: 07/06/2013  Time: 1100-1130 Pt denies pain Individual Therapy  Pt resting in w/c upon arrival.  Pt engaged in sit<>stand from w/c X 5 and static standing X 5 with BUE support for approx 5 secs each occurrence.  Pt required light mod A for sit<>stand and min A while standing.  Pt states that her legs "just give out" when standing and patient is unable to control sitting down requiring max A.  Attempted to have patient perform dynamic standing task but patient unable to remove UE from support.  Focus on activity tolerance, sit<>stand, standing balance, and safety awareness.  Pt expressed that she was concerned that her progress was as slow as it was and that she was somewhat discouraged.  Emotional support and encouragement provided.   Lavone Neri [facility] 07/06/2013, 12:11 PM

## 2013-07-06 NOTE — Plan of Care (Signed)
Problem: RH BLADDER ELIMINATION Goal: RH STG MANAGE BLADDER WITH ASSISTANCE STG Manage Bladder With mod Assistance  Outcome: Not Progressing Pt is unable to void and is requiring I & O cath Q 8 hrs  Problem: RH PAIN MANAGEMENT Goal: RH STG PAIN MANAGED AT OR BELOW PT'S PAIN GOAL Pain level managed at or below 2 with min assistance  Vicodin q 4 hrs PRN

## 2013-07-06 NOTE — Progress Notes (Signed)
Subjective/Complaints: Elizabeth Drake is a 72 y.o. 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 Patient recently treated for third toe cellulitis right foot. Elizabeth Drake is wheelchair-bound prior to admission independent with transfers no ambulation. Admitted 06/24/2013 with shortness of breath and chest heaviness that worsened with minimal exertion. Troponin and cardiac enzymes negative. CT angiogram chest showed extensive pulmonary embolus with evidence of right ventricular strain. Noted pulmonary emboli involve significant portions of both right and left main pulmonary arteries. Patient placed on intravenous heparin and transitioned to Xarelto. Venous Doppler studies lower extremities 06/25/2013 with findings consistent of acute DVT distal femoral-popliteal vein of left lower extremity   still constipated. Doesn't feel that she's doing as well as she should be.  Review of Systems - Negative except Weakness in the legs also see above  Objective: Vital Signs: Blood pressure 130/62, pulse 83, temperature 98.4 F (36.9 C), temperature source Oral, resp. rate 18, height 5\' 6"  (1.676 m), weight 52 kg (114 lb 10.2 oz), SpO2 99.00%. No results found. Results for orders placed during the hospital encounter of 06/30/13 (from the past 72 hour(s))  GLUCOSE, CAPILLARY     Status: Abnormal   Collection Time    07/03/13 11:13 AM      Result Value Range   Glucose-Capillary 292 (*) 70 - 99 mg/dL   Comment 1 Notify RN    GLUCOSE, CAPILLARY     Status: Abnormal   Collection Time    07/03/13  4:26 PM      Result Value Range   Glucose-Capillary 298 (*) 70 - 99 mg/dL   Comment 1 Notify RN    GLUCOSE, CAPILLARY     Status: Abnormal   Collection Time    07/03/13  8:59 PM      Result Value Range   Glucose-Capillary 253 (*) 70 - 99 mg/dL   Comment 1 Notify RN    GLUCOSE, CAPILLARY     Status: None   Collection Time    07/04/13   7:36 AM      Result Value Range   Glucose-Capillary 96  70 - 99 mg/dL  GLUCOSE, CAPILLARY     Status: Abnormal   Collection Time    07/04/13 11:35 AM      Result Value Range   Glucose-Capillary 219 (*) 70 - 99 mg/dL   Comment 1 Notify RN    GLUCOSE, CAPILLARY     Status: Abnormal   Collection Time    07/04/13  4:50 PM      Result Value Range   Glucose-Capillary 226 (*) 70 - 99 mg/dL   Comment 1 Notify RN    GLUCOSE, CAPILLARY     Status: Abnormal   Collection Time    07/04/13  9:12 PM      Result Value Range   Glucose-Capillary 181 (*) 70 - 99 mg/dL  GLUCOSE, CAPILLARY     Status: None   Collection Time    07/05/13  7:31 AM      Result Value Range   Glucose-Capillary 93  70 - 99 mg/dL   Comment 1 Notify RN    GLUCOSE, CAPILLARY     Status: Abnormal   Collection Time    07/05/13 11:48 AM      Result Value Range   Glucose-Capillary 264 (*) 70 - 99 mg/dL   Comment 1 Notify RN    GLUCOSE, CAPILLARY     Status: Abnormal   Collection Time  07/05/13  4:40 PM      Result Value Range   Glucose-Capillary 212 (*) 70 - 99 mg/dL   Comment 1 Notify RN    GLUCOSE, CAPILLARY     Status: Abnormal   Collection Time    07/05/13  8:36 PM      Result Value Range   Glucose-Capillary 160 (*) 70 - 99 mg/dL  GLUCOSE, CAPILLARY     Status: None   Collection Time    07/06/13  7:29 AM      Result Value Range   Glucose-Capillary 89  70 - 99 mg/dL     HEENT: normal Cardio: RRR and No murmur Resp: CTA B/L and Unlabored GI: BS positive and Nondistended Extremity:  Pulses positive and No Edema Skin:   Intact Neuro: Alert/Oriented, Cranial Nerve II-XII normal, Normal Sensory and Abnormal Motor 4+/5 bilateral deltoid, bicep, tricep, grip,   2 minus in the right hip flexor 3 minus knee extensor 2 minus right ankle dorsiflexor plantar flexor  3 minus left hip flexor knee extensor ankle dorsiflexor plantar flexor Musc/Skel:  Other Atrophy in the right leg General no acute  distress   Assessment/Plan: 1. Functional deficits secondary to  deconditioning after pulmonary embolism and DVT in a patient with chronic paraparesis  which require 3+ hours per day of interdisciplinary therapy in a comprehensive inpatient rehab setting. Physiatrist is providing close team supervision and 24 hour management of active medical problems listed below. Physiatrist and rehab team continue to assess barriers to discharge/monitor patient progress toward functional and medical goals. FIM: FIM - Bathing Bathing Steps Patient Completed: Chest;Right Arm;Left Arm;Abdomen;Front perineal area;Left upper leg;Right upper leg;Buttocks (completed LB via rolling in bed secondary to fatigue) Bathing: 4: Min-Patient completes 8-9 12f 10 parts or 75+ percent  FIM - Upper Body Dressing/Undressing Upper body dressing/undressing steps patient completed: Thread/unthread left sleeve of pullover shirt/dress;Put head through opening of pull over shirt/dress;Pull shirt over trunk Upper body dressing/undressing: 4: Min-Patient completed 75 plus % of tasks FIM - Lower Body Dressing/Undressing Lower body dressing/undressing steps patient completed: Pull pants up/down Lower body dressing/undressing: 2: Max-Patient completed 25-49% of tasks  FIM - Toileting Toileting: 0: Activity did not occur  FIM - Archivist Transfers: 0-Activity did not occur  FIM - Banker Devices: Arm rests;Bed rails Bed/Chair Transfer: 2: Chair or W/C > Bed: Max A (lift and lower assist);3: Sit > Supine: Mod A (lifting assist/Pt. 50-74%/lift 2 legs);3: Supine > Sit: Mod A (lifting assist/Pt. 50-74%/lift 2 legs;2: Bed > Chair or W/C: Max A (lift and lower assist)  FIM - Locomotion: Wheelchair Distance: 100 Locomotion: Wheelchair: 0: Activity did not occur FIM - Locomotion: Ambulation Ambulation/Gait Assistance: Not tested (comment) Locomotion: Ambulation: 0: Activity did not  occur  Comprehension Comprehension Mode: Auditory Comprehension: 6-Follows complex conversation/direction: With extra time/assistive device  Expression Expression Mode: Verbal Expression: 5-Expresses complex 90% of the time/cues < 10% of the time  Social Interaction Social Interaction: 6-Interacts appropriately with others with medication or extra time (anti-anxiety, antidepressant).  Problem Solving Problem Solving: 5-Solves complex 90% of the time/cues < 10% of the time  Memory Memory: 5-Recognizes or recalls 90% of the time/requires cueing < 10% of the time  Medical Problem List and Plan  1. 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 DVT Prophylaxis/Anticoagulation: Xarelto. Monitor for any bleeding episodes  3. Pain Management: Hydrocodone as needed. Monitor with increased mobility  4. Neuropsych: This patient is  capable of making decisions on her own behalf.  5. Sarcoidosis. Continue chronic prednisone. Patient with pronounced bilateral lower extremity weakness  6. Diabetes mellitus with peripheral neuropathy. uncontrolled Hemoglobin A1c 7.4. Amaryl 4 mg daily. Checking blood sugars a.c. and at bedtime , resumed metformin 500mg  BID.   Will need further adjustment 7. Hypertension. Lopressor 25 mg twice a day. Monitor with increased mobility  8. Escherichia coli urinary tract infection. Changed rocephin to po ceftin   9. Urinary retention continue Flomax, discontinued Enablex/ still some retention 10. Constipation: augment bowel regimen LOS (Days) 6 A FACE TO FACE EVALUATION WAS PERFORMED  Jiles Goya T 07/06/2013, 8:59 AM

## 2013-07-06 NOTE — Progress Notes (Signed)
Physical Therapy Session Note  Patient Details  Name: Elizabeth Drake MRN: [id number] Date of Birth: 1940-09-19  Today's Date: 07/06/2013 Time: Treatment Session 1: 0930-1030; Treatment Session 2: 1500-1530 Time Calculation (min): Treatment Session 1: 60 min; Treatment Session 2:  Short Term Goals: Week 1:  PT Short Term Goal 1 (Week 1): Pt to maintain postural control during static sitting >57min w/ min A PT Short Term Goal 2 (Week 1): Pt to maintain postural control during dynamic sitting w/ mod A  PT Short Term Goal 3 (Week 1): Pt to perform bed mobility w/ mod A PT Short Term Goal 4 (Week 1): Pt to transfer bed<>w/c w/ mod A  Skilled Therapeutic Interventions/Progress Updates:  Treatment Session 1:  1:1. Pt received sitting in w/c, verbalized feeling fatigued but ready for therapy. Pt able to propel w/c 44' w/ B UE and supervision prior to onset of fatigue and therapist taking over. Introduced Film/video editor this session, pt familiar with technique from therapy many years ago. Pt able to perform level SBT bed<>w/c w/ mod A overall, pt demonstrating good functional strength in B UE, therapist assisting with bottom clearance to prevent skin breakdown. Pt performed supine therex for strengthening in neck, core and B LE. Exercises included 2x10: chin tucks, shoulder protraction, shoulder depression, glute sets, bridging, rotation of B LE in hooklying. Pt req verbal/tactile cues for technique and demonstrating improved quality/endurance of contractions compared to end of last week. Emphasis in sitting on static posture including anterior pelvic tilt and keeping head/chest up w/ verbal and tactile cues. Pt fatiguing very quickly and unable to sustain. Transition to reaching activity to promote anterior pelvic tilt, head/chest up, req multiple rest breaks 2/2 fatigue. Pt verbalized feeling very frustrated and that she feels like a "rag doll." Education regarding amount of time it takes to build muscular  strength vs. Lose it. In // bars, pt able to t/f sit<>stand x2 attempts w/ mod A and blocking of B knees, however, unable to maintain standing >3seconds. Towel roll positioned behind pt sitting in w/c to facilitate anterior pelvic tilt. Pt sitting in w/c at end of session w/ all needs in reach.   Treatment Session 2:  1:1. Pt received semi-reclined in bed, ready for therapy. Focus this session on bed mobility and SBT bed<>w/c x2. Pt req mod A for t/f sup>sit EOB w/ HOB flat and use of bed rails, min cueing for seq. Pt able to perform level and unlevel SBT bed<>w/c w/ mod A for board placement/removal as well as improved bottom clearance. Pt demonstrating good UE strength for transfer completion and repositioning in chair. Pt req rest breaks between reps 2/2 fatigue. Therapist providing positive encouragement to pt for use of slide board as it decreases burden of care on caregiver compared to squat pivot transfer therefore improving pt's level of functional independence. Pt again educated on amount of time it takes to build muscular strength vs. Lose it. Pt sitting in w/c at end of session w/ all needs in reach.   Therapy Documentation Precautions:  Precautions Precautions: Fall Restrictions Weight Bearing Restrictions: No   Pain: Pain Assessment Pain Score: 6  Pain Type: Chronic pain Pain Location: Leg Pain Orientation: Right;Left Pain Descriptors / Indicators: Aching;Discomfort;Constant Pain Onset: On-going Pain Intervention(s): Medication (See eMAR)  See FIM for current functional status  Therapy/Group: Individual Therapy  Denzil Hughes 07/06/2013, 12:16 PM

## 2013-07-06 NOTE — Progress Notes (Signed)
Occupational Therapy Session Note  Patient Details  Name: Elizabeth Drake MRN: [id number] Date of Birth: 1940/11/10  Today's Date: 07/06/2013 Time: 0830-0930 Time Calculation (min): 60 min  Short Term Goals: Week 1:  OT Short Term Goal 1 (Week 1): Bath:  Mod assist with bath to include lateral leans for peri area and buttocks OT Short Term Goal 2 (Week 1): LB Dressing:  Mod assist to include lateral leans OT Short Term Goal 3 (Week 1): Toilet Transfer: Mod assist with squat pivot OT Short Term Goal 4 (Week 1): Toileting:  Mod assist with 2/3 tasks to include lateral leans  Skilled Therapeutic Interventions/Progress Updates:    1:1 self care retraining at EOB including bathing and dressing. Focus on bed mobility from flat bed with bed rail with more than reasonable amt of time and min  A. Pt sat EOB with focus on sitting tolerance, activity tolerance and sitting balance. Pt performed sit to stands from elevated bed height with therapist sitting in w/c in front of her; pt pushed up from bed and then able to steady herself using w/c arm rests in front of her. Pt required bilateral UE support to remain standing. Pt could stand for 5-10 sec before needing to return to seated position. Pt requested a supine rest break- assisted with mod A. Pt threaded pants in supine with min A and total A for donning socks. Pt returned to EOB and performed sit to stands again with min A with elevated surface. Transferred using posterior approach with mod A. Performed grooming mod I at sink .  Therapy Documentation Precautions:  Precautions Precautions: Fall Restrictions Weight Bearing Restrictions: No Pain: Pain Assessment Pain Score: 8  Pain Type: Chronic pain Pain Location: Leg Pain Orientation: Right;Left Pain Descriptors / Indicators: Aching;Discomfort;Constant Pain Onset: On-going Pain Intervention(s): Medication (See eMAR)  Repositioned- RN aware  See FIM for current functional  status  Therapy/Group: Individual Therapy  [name] [facility] 07/06/2013, 9:34 AM

## 2013-07-07 ENCOUNTER — Inpatient Hospital Stay (HOSPITAL_COMMUNITY): Payer: Medicare Other

## 2013-07-07 ENCOUNTER — Encounter (HOSPITAL_COMMUNITY): Payer: No Typology Code available for payment source | Admitting: Occupational Therapy

## 2013-07-07 ENCOUNTER — Inpatient Hospital Stay (HOSPITAL_COMMUNITY): Payer: Medicare Other | Admitting: *Deleted

## 2013-07-07 ENCOUNTER — Inpatient Hospital Stay (HOSPITAL_COMMUNITY): Payer: No Typology Code available for payment source

## 2013-07-07 DIAGNOSIS — G8220 Paraplegia, unspecified: Secondary | ICD-10-CM

## 2013-07-07 DIAGNOSIS — D869 Sarcoidosis, unspecified: Secondary | ICD-10-CM

## 2013-07-07 DIAGNOSIS — R5381 Other malaise: Secondary | ICD-10-CM

## 2013-07-07 LAB — URINE CULTURE

## 2013-07-07 LAB — GLUCOSE, CAPILLARY
Glucose-Capillary: 90 mg/dL (ref 70–99)
Glucose-Capillary: 250 mg/dL — ABNORMAL HIGH (ref 70–99)
Glucose-Capillary: 243 mg/dL — ABNORMAL HIGH (ref 70–99)

## 2013-07-07 MED ORDER — GLIMEPIRIDE 4 MG PO TABS
8.0000 mg | ORAL_TABLET | Freq: Every day | ORAL | Status: DC
  Administered 2013-07-07 – 2013-07-18 (×12): 8 mg via ORAL
  Filled 2013-07-07 (×13): qty 2

## 2013-07-07 NOTE — Patient Care Conference (Signed)
Inpatient RehabilitationTeam Conference and Plan of Care Update Date: 07/07/2013   Time: 2:55 PM    Patient Name: Elizabeth Drake      Medical Record Number: [id number]  Date of Birth: [birth year] Sex: Female         Room/Bed: 4W23C/4W23C-01 Payor Info: Payor: MEDICARE / Plan: MEDICARE PART A AND B / Product Type: *No Product type* /    Admitting Diagnosis: Sarcold myelopathy  Admit Date/Time:  06/30/2013  7:28 PM Admission Comments: No comment available   Primary Diagnosis:  <principal problem not specified> Principal Problem: <principal problem not specified>  Patient Active Problem List   Diagnosis Date Noted  . Physical deconditioning 06/30/2013  . DKA, type 2 06/25/2013  . Pulmonary embolism 06/24/2013  . PE (pulmonary thromboembolism) 06/24/2013  . Hypoxemia 06/24/2013  . Tachycardia 06/24/2013  . Cellulitis of foot without toes, right 06/24/2013  . DM type 2 (diabetes mellitus, type 2) 04/25/2013  . OAB (overactive bladder) 04/25/2013  . Cellulitis of toe of left foot with ulcer 04/25/2013  . Leukocytosis, unspecified 04/25/2013  . 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 Wheelchair bound 04/25/2013  . UTI (lower urinary tract infection) 04/25/2013  . Osteoporosis, unspecified 04/25/2013  . Chronic back pain 04/25/2013  . Chronic leg pain 04/25/2013  . OA (osteoarthritis) 04/25/2013  . Hyperlipidemia 12/31/2012  . Sarcoidosis 12/31/2012    Expected Discharge Date: Expected Discharge Date: 07/16/13  Team Members Present: Physician leading conference: Dr. Faith Rogue Social Worker Present: Amada Jupiter, LCSW Nurse Present: Keturah Barre, RN PT Present: Cyndia Skeeters, Scot Jun, PT OT Present: Ardis Rowan, COTA;Jennifer Marlis Edelson, OT SLP Present: Feliberto Gottron, SLP PPS Coordinator present : Tora Duck, RN, CRRN;Becky Henrene Dodge, PT     Current Status/Progress Goal Weekly Team Focus  Medical   deconditioning after sepsis. pe. hx of sarcoid (?myelopathy)  improve  stamina to allow increased participation in therapy  motivation, bowel mgt. fluid/volume balance   Bowel/Bladder   Incontinent of B&B. Has had 1 episode of incontinence. Continues to have urinary retention. Scanned for 379 ml, cath for 350 ml at 0026. LBM: 07/06/13  Continent of B&B  [facility] the [facility], bedpan @ night   Swallow/Nutrition/ Hydration             ADL's   supervisin for UB, max A for LB sit to stands, stand pivot transfers with mod-max A, slide board transfers with min A   overall min  A  sit to stands, activity tolerance, standing balance   Mobility   Mod A for mobility, Supervision for w/c propulsion <75'; very poor postural control and functional endurance  Mod(I) for w/c propulsion <100' in controlled/home environments. (S) for dynamic sitting balance; Min A for bed mob, transfers  Gross strengthening, functional endurance, slide board transfers, w/c propulsion, education   Communication             Safety/Cognition/ Behavioral Observations            Pain   Pain ranked as 7/10 with a quality of being aching and constant. Vicodin 5/325, 1 tab, given @ 2230.   3 or less  Monitor effectiveness of pain med   Skin   Necrotic area to right foot 2nd toe, left 3rd toe is red, stage 1 on buttocks  Monitor skin Q shift for no new breakdown; no s/s of infection  Free of new skin breakdown    Rehab Goals Patient on target to meet rehab goals: Yes *See Care Plan  and progress notes for long and short-term goals.  Barriers to Discharge: motivation, realistic expectations of progress and goals    Possible Resolutions to Barriers:  pt education, adaptive equipment, supervision at home    Discharge Planning/Teaching Needs:  home with husband to provide 24/7 assist with recommended private duty assist vs SNF      Team Discussion:  Pt very discouraged with her slow progress and not very realistic about time needed for recovery. Will definitely need 24/7 physical assistance  at home which pt does not feel her husband can provide.  Recommend private duty care or consideration of SNF.  Minimal assist w/c level overall.   Revisions to Treatment Plan:  neuropsych consult recommended.   Continued Need for Acute Rehabilitation Level of Care: The patient requires daily medical management by a physician with specialized training in physical medicine and rehabilitation for the following conditions: Daily direction of a multidisciplinary physical rehabilitation program to ensure safe treatment while eliciting the highest outcome that is of practical value to the patient.: Yes Daily medical management of patient stability for increased activity during participation in an intensive rehabilitation regime.: Yes Daily analysis of laboratory values and/or radiology reports with any subsequent need for medication adjustment of medical intervention for : Pulmonary problems;Neurological problems;Other  [name] 07/07/2013, 7:32 PM

## 2013-07-07 NOTE — Progress Notes (Signed)
Physical Therapy Session Note  Patient Details  Name: Elizabeth Drake MRN: [id number] Date of Birth: 1941/05/13  Today's Date: 07/07/2013 Time: Treatment Session 1: 1191-4782; Treatment Session 2: 9562-1308; Treatment Session 3:  1330-1400 Time Calculation (min): Treatment Session 1: 48 min; Treatment Session 2: ; Treatment Session 3:61min   Short Term Goals: Week 1:  PT Short Term Goal 1 (Week 1): Pt to maintain postural control during static sitting >3min w/ min A PT Short Term Goal 2 (Week 1): Pt to maintain postural control during dynamic sitting w/ mod A  PT Short Term Goal 3 (Week 1): Pt to perform bed mobility w/ mod A PT Short Term Goal 4 (Week 1): Pt to transfer bed<>w/c w/ mod A  Skilled Therapeutic Interventions/Progress Updates:  Treatment Session 1:  1:1. Pt received sitting in w/c, ready for therapy. Pt stated feeling very fatigued and grossly sore, RN made aware. Pt able to briefly propel w/c 71' w/ close (S), therapist took over 2/2 fatigue. Pt able to perform SBT bed<>w/c w/ min-mod A for board placement/removal and bottom clearance, min cue for safety/seq. Pt w/ good tolerance to supine therex to target neck, core and B LE strength. Exercises included 2/10 reps of: B scapular retraction, glute sets, SAQ and hip flexion/ext w/ B LE elevated on therapy ball. Pt demonstrating improved quality of muscle contractions w/ decreased cues for technique. Req rest breaks between exercises 2/2 fatigue. In // bars, pt able to t/f sit<>stand x3 w/ mod A and therapist blocking R knee to prevent hyperextension. Pt able to maintain standing 8sec 1x and 5 sec 2 and 3x's. Pt verbalized frustration regarding want to go home and be at mod(I) level again. Positive encouragement and education provided regarding thinking about gains she has made vs. Where she wants to be, time it takes to regain strength/endurance vs. Lose as well as benefits of ADs to increased pt's level of functional independence  therefore decreasing burden of care on caregiver. Pt also verbalized feeling very down due to the holidays approaching and how active she normal is at this time. Will discuss w/ rec therapist regarding pt's participation in holiday related therapeutic tasks. Pt sitting in w/c at end of session w/ all needs in reach.   Treatment Session 2:  1:1. Pt received sitting in w/c, ready for therapy. Focus this tx session on functional transfers in therapy apartment. Pt req min-mod A for level SBT w/c<>bed, but mod A for unlevel SBT w/c<>couch. Pt able to t/f sup<>sit in a standard bed w/ mod A, pt attempting to self-assist B LE but unable to achieve bed clearance. Pt stated at home she would pull on sheets for assist to t/f sup>sit. Pt also able to manage w/c in home environment setup on carpeted surface x25' w/ (S)-min A for obstacle negotiation to pull up next to furniture. Min-mod cueing throughout session for seq and technique. Pt sitting in w/c at end of session w/ all needs in reach.   Treatment Session 3: Co-tx this session w/ rec therapist. Pt received sitting in w/c, speaking with rec therapist. Focus this session on dynamic sitting balance as well as functional transfers. Dynamic sitting balance targeted while pt sitting in w/c w/ therapist facilitating anterior pelvic tilt to promote upright posture as well as pt's ability to reach +/-midline while putting ornaments on tree. Pt req mod A for SBT w/c<>bed and t/f sit>sup. Pt semi-reclined in bed at end of session w/ all needs in reach.   Pt  encouraged to verbalized 3 things that went well each session to facilitate positive encouragement.    Therapy Documentation Precautions:  Precautions Precautions: Fall Restrictions Weight Bearing Restrictions: No Pain:    See FIM for current functional status  Therapy/Group: Individual Therapy  Denzil Hughes 07/07/2013, 10:19 AM

## 2013-07-07 NOTE — Plan of Care (Signed)
Problem: RH BLADDER ELIMINATION Goal: RH STG MANAGE BLADDER WITH ASSISTANCE STG Manage Bladder With mod Assistance  Outcome: Progressing 1 episode of incontinence; continues to retain urine. Scanned for 379, cath for 350 @ 0026.

## 2013-07-07 NOTE — Progress Notes (Signed)
Recreational Therapy Assessment and Plan  Patient Details  Name: Elizabeth Drake MRN: [id number] Date of Birth: [birth year] Today's Date: 07/07/2013  Rehab Potential: Good ELOS: 2 weeks   Assessment Clinical Impression: Problem List:  Patient Active Problem List    Diagnosis  Date Noted   .  Physical deconditioning  06/30/2013   .  DKA, type 2  06/25/2013   .  Pulmonary embolism  06/24/2013   .  PE (pulmonary thromboembolism)  06/24/2013   .  Hypoxemia  06/24/2013   .  Tachycardia  06/24/2013   .  Cellulitis of foot without toes, right  06/24/2013   .  DM type 2 (diabetes mellitus, type 2)  04/25/2013   .  OAB (overactive bladder)  04/25/2013   .  Cellulitis of toe of left foot with ulcer  04/25/2013   .  Leukocytosis, unspecified  04/25/2013   .  Paraparesis  04/25/2013   .  Wheelchair bound  04/25/2013   .  UTI (lower urinary tract infection)  04/25/2013   .  Osteoporosis, unspecified  04/25/2013   .  Chronic back pain  04/25/2013   .  Chronic leg pain  04/25/2013   .  OA (osteoarthritis)  04/25/2013   .  Hyperlipidemia  12/31/2012   .  Sarcoidosis  12/31/2012    Past Medical History:  Past Medical History   Diagnosis  Date   .  Sarcoidosis    .  Hiatal hernia    .  Diabetes mellitus without complication    .  Osteoporosis     Past Surgical History:  Past Surgical History   Procedure  Laterality  Date   .  Abdominal hysterectomy     .  Joint replacement       toe   .  Lung lobectomy  Left      Lower    Assessment & Plan  Clinical Impression: Elizabeth Drake is a 72 y.o. 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 Patient recently treated for third toe cellulitis right foot. Elizabeth Drake is wheelchair-bound prior to admission independent with transfers no ambulation. Admitted 06/24/2013 with shortness of breath and chest heaviness that worsened with minimal exertion. Troponin and cardiac  enzymes negative. CT angiogram chest showed extensive pulmonary embolus with evidence of right ventricular strain. Noted pulmonary emboli involve significant portions of both right and left main pulmonary arteries. Patient placed on intravenous heparin and transitioned to Xarelto. Venous Doppler studies lower extremities 06/25/2013 with findings consistent of acute DVT distal femoral-popliteal vein of left lower extremity. Urine culture greater than 100,000 Escherichia coli and maintained on Rocephin. Physical therapy evaluation completed 06/29/2013 with recommendations for physical medicine rehabilitation consult to consider inpatient rehabilitation services. Patient was felt to be a good candidate for inpatient rehabilitation services was admitted for a comprehensive rehabilitation program. Patient transferred to CIR on 06/30/2013.    Pt presents with decreased activity tolerance, decreased functional mobility, decreased balance, decreased postural control, Limiting pt's independence with leisure/community pursuits.  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 Other Leisure Interests: Television;Reading;Cooking/Baking;Housework Leisure Participation Style: Alone;With Family/Friends Awareness of Community Resources: Fair-identify 2 post discharge leisure resources Psychosocial / Spiritual Social interaction - Mood/Behavior: Cooperative Firefighter Appropriate for Education?: Yes Recreational Therapy Orientation Orientation -Reviewed with patient: Available activity resources Strengths/Weaknesses Patient Strengths/Abilities: Willingness to participate Patient weaknesses: Physical limitations TR Patient demonstrates impairments in the following area(s): Endurance;Pain  Plan Rec Therapy Plan Is patient appropriate for Therapeutic Recreation?: Yes Rehab Potential: Good Treatment times per week:  Min 1 time per week >20 minutes Estimated Length of Stay: 2 weeks TR Treatment/Interventions: Adaptive equipment instruction;1:1 session;Balance/vestibular training;Functional mobility training;Community reintegration;Patient/family education;Therapeutic activities;Recreation/leisure participation;Therapeutic exercise;UE/LE Coordination activities;Wheelchair propulsion/positioning  Recommendations for other services: None  Discharge Criteria: Patient will be discharged from TR if patient refuses treatment 3 consecutive times without medical reason.  If treatment goals not met, if there is a change in medical status, if patient makes no progress towards goals or if patient is discharged from hospital.  The above assessment, treatment plan, treatment alternatives and goals were discussed and mutually agreed upon: by patient  Elizabeth Drake 07/07/2013, 4:35 PM

## 2013-07-07 NOTE — Progress Notes (Signed)
Occupational Therapy Session Note  Patient Details  Name: LYSBETH DICOLA MRN: [id number] Date of Birth: [birth year]  Today's Date: 07/07/2013 Time: 0830-0930 Time Calculation (min): 60 min  Short Term Goals: Week 1:  OT Short Term Goal 1 (Week 1): Bath:  Mod assist with bath to include lateral leans for peri area and buttocks OT Short Term Goal 2 (Week 1): LB Dressing:  Mod assist to include lateral leans OT Short Term Goal 3 (Week 1): Toilet Transfer: Mod assist with squat pivot OT Short Term Goal 4 (Week 1): Toileting:  Mod assist with 2/3 tasks to include lateral leans  Skilled Therapeutic Interventions/Progress Updates:    1:1 self care retraining including bed mobility from flat bed with bed rails, stand pivot transfer with max A bed to [facility], toileting with total A, sit to stands with  Bilateral UE support, standing balance and tolerance with bilateral UE support, sitting unsupported tolerance,  Transfer bed to w/c with slide board with min A with mod verbal cues for sequencing. Pt continues to present with significant right knee hyperextension with standing requiring facilitation to maintain proper stability at neutral.   Therapy Documentation Precautions:  Precautions Precautions: Fall Restrictions Weight Bearing Restrictions: No Pain: 8/10 pain- called for meds; RN came and addressed  See FIM for current functional status  Therapy/Group: Individual Therapy  [name] [facility] 07/07/2013, 1:53 PM

## 2013-07-07 NOTE — Progress Notes (Signed)
Subjective/Complaints: Elizabeth Drake is a 72 y.o. 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 Patient recently treated for third toe cellulitis right foot. Elizabeth Drake is wheelchair-bound prior to admission independent with transfers no ambulation. Admitted 06/24/2013 with shortness of breath and chest heaviness that worsened with minimal exertion. Troponin and cardiac enzymes negative. CT angiogram chest showed extensive pulmonary embolus with evidence of right ventricular strain. Noted pulmonary emboli involve significant portions of both right and left main pulmonary arteries. Patient placed on intravenous heparin and transitioned to Xarelto. Venous Doppler studies lower extremities 06/25/2013 with findings consistent of acute DVT distal femoral-popliteal vein of left lower extremity   moved bowels. Still anxious as she perceives that she's not recovering at the rate she expected Review of Systems - otherwise negative  Objective: Vital Signs: Blood pressure 150/71, pulse 75, temperature 97.3 F (36.3 C), temperature source Oral, resp. rate 18, height 5\' 6"  (1.676 m), weight 50.8 kg (111 lb 15.9 oz), SpO2 98.00%. No results found. Results for orders placed during the hospital encounter of 06/30/13 (from the past 72 hour(s))  GLUCOSE, CAPILLARY     Status: Abnormal   Collection Time    07/04/13 11:35 AM      Result Value Range   Glucose-Capillary 219 (*) 70 - 99 mg/dL   Comment 1 Notify RN    GLUCOSE, CAPILLARY     Status: Abnormal   Collection Time    07/04/13  4:50 PM      Result Value Range   Glucose-Capillary 226 (*) 70 - 99 mg/dL   Comment 1 Notify RN    GLUCOSE, CAPILLARY     Status: Abnormal   Collection Time    07/04/13  9:12 PM      Result Value Range   Glucose-Capillary 181 (*) 70 - 99 mg/dL  GLUCOSE, CAPILLARY     Status: None   Collection Time    07/05/13  7:31 AM      Result Value Range   Glucose-Capillary 93  70 - 99 mg/dL   Comment 1 Notify RN    GLUCOSE, CAPILLARY     Status: Abnormal   Collection Time    07/05/13 11:48 AM      Result Value Range   Glucose-Capillary 264 (*) 70 - 99 mg/dL   Comment 1 Notify RN    GLUCOSE, CAPILLARY     Status: Abnormal   Collection Time    07/05/13  4:40 PM      Result Value Range   Glucose-Capillary 212 (*) 70 - 99 mg/dL   Comment 1 Notify RN    GLUCOSE, CAPILLARY     Status: Abnormal   Collection Time    07/05/13  8:36 PM      Result Value Range   Glucose-Capillary 160 (*) 70 - 99 mg/dL  GLUCOSE, CAPILLARY     Status: None   Collection Time    07/06/13  7:29 AM      Result Value Range   Glucose-Capillary 89  70 - 99 mg/dL  GLUCOSE, CAPILLARY     Status: Abnormal   Collection Time    07/06/13 11:36 AM      Result Value Range   Glucose-Capillary 277 (*) 70 - 99 mg/dL   Comment 1 Notify RN    GLUCOSE, CAPILLARY     Status: Abnormal   Collection Time    07/06/13  4:28 PM      Result Value Range  Glucose-Capillary 251 (*) 70 - 99 mg/dL   Comment 1 Notify RN    GLUCOSE, CAPILLARY     Status: Abnormal   Collection Time    07/06/13  7:53 PM      Result Value Range   Glucose-Capillary 253 (*) 70 - 99 mg/dL  GLUCOSE, CAPILLARY     Status: None   Collection Time    07/07/13  7:34 AM      Result Value Range   Glucose-Capillary 90  70 - 99 mg/dL   Comment 1 Notify RN       HEENT: normal Cardio: RRR and No murmur Resp: CTA B/L and Unlabored GI: BS positive and Nondistended Extremity:  Pulses positive and No Edema Skin:   Intact Neuro: Alert/Oriented, Cranial Nerve II-XII normal, Normal Sensory and Abnormal Motor 4+/5 bilateral deltoid, bicep, tricep, grip,   2   in the right hip flexor 3 minus knee extensor 2   right ankle dorsiflexor plantar flexor  3 minus left hip flexor knee extensor ankle dorsiflexor plantar flexor Musc/Skel:  Other Atrophy in the right leg General no acute distress   Assessment/Plan: 1.  Functional deficits secondary to  deconditioning after pulmonary embolism and DVT in a patient with chronic paraparesis  which require 3+ hours per day of interdisciplinary therapy in a comprehensive inpatient rehab setting. Physiatrist is providing close team supervision and 24 hour management of active medical problems listed below. Physiatrist and rehab team continue to assess barriers to discharge/monitor patient progress toward functional and medical goals. FIM: FIM - Bathing Bathing Steps Patient Completed: Chest;Right Arm;Left Arm;Abdomen;Right upper leg;Left upper leg;Left lower leg (including foot) Bathing: 3: Mod-Patient completes 5-7 37f 10 parts or 50-74%  FIM - Upper Body Dressing/Undressing Upper body dressing/undressing steps patient completed: Thread/unthread right sleeve of pullover shirt/dresss;Thread/unthread left sleeve of pullover shirt/dress;Put head through opening of pull over shirt/dress;Pull shirt over trunk Upper body dressing/undressing: 5: Set-up assist to: Obtain clothing/put away FIM - Lower Body Dressing/Undressing Lower body dressing/undressing steps patient completed: Pull pants up/down Lower body dressing/undressing: 1: Total-Patient completed less than 25% of tasks  FIM - Toileting Toileting: 0: Activity did not occur  FIM - Archivist Transfers: 0-Activity did not occur  FIM - Banker Devices: Arm rests;Bed rails Bed/Chair Transfer: 3: Supine > Sit: Mod A (lifting assist/Pt. 50-74%/lift 2 legs;3: Sit > Supine: Mod A (lifting assist/Pt. 50-74%/lift 2 legs);3: Bed > Chair or W/C: Mod A (lift or lower assist);3: Chair or W/C > Bed: Mod A (lift or lower assist)  FIM - Locomotion: Wheelchair Distance: 100 Locomotion: Wheelchair: 2: Travels 50 - 149 ft with supervision, cueing or coaxing FIM - Locomotion: Ambulation Ambulation/Gait Assistance: Not tested (comment) Locomotion: Ambulation: 0: Activity did  not occur  Comprehension Comprehension Mode: Auditory Comprehension: 5-Understands complex 90% of the time/Cues < 10% of the time  Expression Expression Mode: Verbal Expression: 5-Expresses complex 90% of the time/cues < 10% of the time  Social Interaction Social Interaction: 6-Interacts appropriately with others with medication or extra time (anti-anxiety, antidepressant).  Problem Solving Problem Solving: 5-Solves complex 90% of the time/cues < 10% of the time  Memory Memory: 5-Recognizes or recalls 90% of the time/requires cueing < 10% of the time  Medical Problem List and Plan  1. 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 DVT Prophylaxis/Anticoagulation: Xarelto. Monitor for any bleeding episodes  3. Pain Management: Hydrocodone as needed. Monitor with increased mobility  4.  Neuropsych: This patient is capable of making decisions on her own behalf.   -needs regular positive reinforcement 5. Sarcoidosis. Continue chronic prednisone. Patient with pronounced bilateral lower extremity weakness  6. Diabetes mellitus with peripheral neuropathy. uncontrolled Hemoglobin A1c 7.4. Amaryl increased to 8 mg daily. Checking blood sugars a.c. and at bedtime , resumed metformin 500mg  BID.   Will need further adjustment 7. Hypertension. Lopressor 25 mg twice a day. Monitor with increased mobility  8. Escherichia coli urinary tract infection.  po ceftin   9. Urinary retention continue Flomax, discontinued Enablex/ still some retention 10. Constipation: augmented bowel regimen with results.  LOS (Days) 7 A FACE TO FACE EVALUATION WAS PERFORMED  SWARTZ,ZACHARY T 07/07/2013, 7:46 AM

## 2013-07-07 NOTE — Progress Notes (Signed)
Occupational Therapy Note  Patient Details  Name: Elizabeth Drake MRN: [id number] Date of Birth: June 28, 1941 Today's Date: 07/07/2013  Time: 1130-1159 Pt denies pain Individual therapy  Pt initially engaged in sit<>stand from w/c X 5 requiring min A.  Pt able to stand for short periods only (5 secs) each time.  Pt expressed frustration that she wasn't able to "do better." Introduced drop arm BSC and discussed use with sliding board.  Remaining time did not permit practicing transfer at this time but plan to practice tomorrow.   Lavone Neri [facility] 07/07/2013, 12:00 PM

## 2013-07-07 NOTE — Progress Notes (Signed)
Social Work Patient ID: Elizabeth Drake, female   DOB: 02-19-1941, 72 y.o.   MRN: [id number]  Met with pt and husband following team conference today and discussed targeted d/c date of 12/11 and goals of minimal assistance w/c level.  Pt very doubtful that she will be "ready" for d/c on this date.  Stressed to pt and husband that team feels she will  possibly need more assistance than her husband could provide, therefore, need to consider private duty care or SNF at least initially.  Also reviewed ELOS for CIR and that her recovery to her preferred level of function will be on a much longer term timeframe.  Pt and husband indicate they understand this and plan to discuss and follow up with me tomorrow about their d/c plan decision.  Will keep staff posted.  Elizabeth Redmon, LCSW

## 2013-07-08 ENCOUNTER — Encounter (HOSPITAL_COMMUNITY): Payer: No Typology Code available for payment source | Admitting: Occupational Therapy

## 2013-07-08 ENCOUNTER — Inpatient Hospital Stay (HOSPITAL_COMMUNITY): Payer: No Typology Code available for payment source | Admitting: *Deleted

## 2013-07-08 ENCOUNTER — Inpatient Hospital Stay (HOSPITAL_COMMUNITY): Payer: Medicare Other

## 2013-07-08 LAB — GLUCOSE, CAPILLARY
Glucose-Capillary: 68 mg/dL — ABNORMAL LOW (ref 70–99)
Glucose-Capillary: 220 mg/dL — ABNORMAL HIGH (ref 70–99)
Glucose-Capillary: 199 mg/dL — ABNORMAL HIGH (ref 70–99)

## 2013-07-08 MED ORDER — METFORMIN HCL 500 MG PO TABS
500.0000 mg | ORAL_TABLET | Freq: Every day | ORAL | Status: DC
  Administered 2013-07-09 – 2013-07-10 (×2): 500 mg via ORAL
  Filled 2013-07-08 (×3): qty 1

## 2013-07-08 MED ORDER — ENSURE COMPLETE PO LIQD: 237.0000 mL | Freq: Every day | ORAL | Status: DC | PRN

## 2013-07-08 NOTE — Progress Notes (Signed)
Inpatient Diabetes Program Recommendations  AACE/ADA: New Consensus Statement on Inpatient Glycemic Control (2013)  Target Ranges:  Prepandial:   less than 140 mg/dL      Peak postprandial:   less than 180 mg/dL (1-2 hours)      Critically ill patients:  140 - 180 mg/dL   Results for SANTOS, HARDWICK (MRN [id number]) as of 07/08/2013 15:04  Ref. Range 07/07/2013 07:34 07/07/2013 11:37 07/07/2013 16:48 07/07/2013 20:30 07/08/2013 07:33 07/08/2013 08:28 07/08/2013 12:07  Glucose-Capillary Latest Range: 70-99 mg/dL 90 914 (H) 782 (H) 956 (H) 68 (L) 159 (H) 220 (H)   Fasting glucose low this am and typically less than 100 mg/dL  Pt does not appear to need Metformin while here, as this contribute to early am hypoglycemia.  Also, Amaryl 8 mg is a high dose that is not controlling post-prandial glucose levels, as all are in the 200's.  Highly recommend discontinuing Amaryl while here and using meal coverage instead or even correction insulin.at moderate level.  Would first start with 3-4 units Novolog meal coverage tidwc.  Thank you, Lenor Coffin, RN, CNS, Diabetes Coordinator [phone])

## 2013-07-08 NOTE — Progress Notes (Signed)
Physical Therapy Session Note  Patient Details  Name: Elizabeth Drake MRN: [id number] Date of Birth: 1940-10-16  Today's Date: 07/08/2013 Time: 1120-1204 Time Calculation (min): 44 min  Short Term Goals: Week 1:  PT Short Term Goal 1 (Week 1): Pt to maintain postural control during static sitting >2min w/ min A PT Short Term Goal 2 (Week 1): Pt to maintain postural control during dynamic sitting w/ mod A  PT Short Term Goal 3 (Week 1): Pt to perform bed mobility w/ mod A PT Short Term Goal 4 (Week 1): Pt to transfer bed<>w/c w/ mod A  Skilled Therapeutic Interventions/Progress Updates:    Session focused on overall activity tolerance, posture, dynamic sitting balance while opening mail and preparing peanut butter crackers seated edge of mat and required S to mod A for balance and postural control. Completed slideboard transfers with close S/steady A to/from w/c and mat.   Therapy Documentation Precautions:  Precautions Precautions: Fall Restrictions Weight Bearing Restrictions: No Pain: C/o generalized pain - RN aware but unable to get next dose until lunchtime.   See FIM for current functional status  Therapy/Group: Individual Therapy and Co-Treatment with Rec. Therapy  Karolee Stamps [facility] 07/08/2013, 12:06 PM

## 2013-07-08 NOTE — Progress Notes (Signed)
Subjective/Complaints: Elizabeth Drake is a 72 y.o. 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 Patient recently treated for third toe cellulitis right foot. Elizabeth Drake is wheelchair-bound prior to admission independent with transfers no ambulation. Admitted 06/24/2013 with shortness of breath and chest heaviness that worsened with minimal exertion. Troponin and cardiac enzymes negative. CT angiogram chest showed extensive pulmonary embolus with evidence of right ventricular strain. Noted pulmonary emboli involve significant portions of both right and left main pulmonary arteries. Patient placed on intravenous heparin and transitioned to Xarelto. Venous Doppler studies lower extremities 06/25/2013 with findings consistent of acute DVT distal femoral-popliteal vein of left lower extremity   slept very well last night. No new problems reported. Still hoped to have more progress by now. Review of Systems - otherwise negative  Objective: Vital Signs: Blood pressure 127/69, pulse 71, temperature 97.9 F (36.6 C), temperature source Oral, resp. rate 16, height 5\' 6"  (1.676 m), weight 51.2 kg (112 lb 14 oz), SpO2 97.00%. No results found. Results for orders placed during the hospital encounter of 06/30/13 (from the past 72 hour(s))  GLUCOSE, CAPILLARY     Status: Abnormal   Collection Time    07/05/13 11:48 AM      Result Value Range   Glucose-Capillary 264 (*) 70 - 99 mg/dL   Comment 1 Notify RN    GLUCOSE, CAPILLARY     Status: Abnormal   Collection Time    07/05/13  4:40 PM      Result Value Range   Glucose-Capillary 212 (*) 70 - 99 mg/dL   Comment 1 Notify RN    GLUCOSE, CAPILLARY     Status: Abnormal   Collection Time    07/05/13  8:36 PM      Result Value Range   Glucose-Capillary 160 (*) 70 - 99 mg/dL  GLUCOSE, CAPILLARY     Status: None   Collection Time    07/06/13  7:29 AM      Result Value Range    Glucose-Capillary 89  70 - 99 mg/dL  GLUCOSE, CAPILLARY     Status: Abnormal   Collection Time    07/06/13 11:36 AM      Result Value Range   Glucose-Capillary 277 (*) 70 - 99 mg/dL   Comment 1 Notify RN    URINE CULTURE     Status: None   Collection Time    07/06/13 11:45 AM      Result Value Range   Specimen Description URINE, CATHETERIZED     Special Requests NONE     Culture  Setup Time       Value: 07/06/2013 17:27     Performed at [facility] Count       Value: 15,000 COLONIES/ML     Performed at [facility]   Culture       Value: YEAST     Performed at [facility]   Report Status 07/07/2013 FINAL    GLUCOSE, CAPILLARY     Status: Abnormal   Collection Time    07/06/13  4:28 PM      Result Value Range   Glucose-Capillary 251 (*) 70 - 99 mg/dL   Comment 1 Notify RN    GLUCOSE, CAPILLARY     Status: Abnormal   Collection Time    07/06/13  7:53 PM      Result Value Range   Glucose-Capillary 253 (*) 70 - 99  mg/dL  GLUCOSE, CAPILLARY     Status: None   Collection Time    07/07/13  7:34 AM      Result Value Range   Glucose-Capillary 90  70 - 99 mg/dL   Comment 1 Notify RN    GLUCOSE, CAPILLARY     Status: Abnormal   Collection Time    07/07/13 11:37 AM      Result Value Range   Glucose-Capillary 219 (*) 70 - 99 mg/dL   Comment 1 Notify RN    GLUCOSE, CAPILLARY     Status: Abnormal   Collection Time    07/07/13  4:48 PM      Result Value Range   Glucose-Capillary 250 (*) 70 - 99 mg/dL   Comment 1 Notify RN    GLUCOSE, CAPILLARY     Status: Abnormal   Collection Time    07/07/13  8:30 PM      Result Value Range   Glucose-Capillary 243 (*) 70 - 99 mg/dL  GLUCOSE, CAPILLARY     Status: Abnormal   Collection Time    07/08/13  7:33 AM      Result Value Range   Glucose-Capillary 68 (*) 70 - 99 mg/dL   Comment 1 Notify RN       HEENT: normal Cardio: RRR and No murmur Resp: CTA B/L and Unlabored GI: BS positive and  Nondistended Extremity:  Pulses positive and No Edema Skin:   Intact Neuro: Alert/Oriented, Cranial Nerve II-XII normal, Normal Sensory and Abnormal Motor 4+/5 bilateral deltoid, bicep, tricep, grip,   2   in the right hip flexor 3 minus knee extensor 2   right ankle dorsiflexor plantar flexor  3 minus left hip flexor knee extensor ankle dorsiflexor plantar flexor Musc/Skel:  Other Atrophy in the right leg General no acute distress   Assessment/Plan: 1. Functional deficits secondary to  deconditioning after pulmonary embolism and DVT in a patient with chronic paraparesis  which require 3+ hours per day of interdisciplinary therapy in a comprehensive inpatient rehab setting. Physiatrist is providing close team supervision and 24 hour management of active medical problems listed below. Physiatrist and rehab team continue to assess barriers to discharge/monitor patient progress toward functional and medical goals.  Pt will need some assistance once home. SW looking the possibility of hired help at home.   FIM: FIM - Bathing Bathing Steps Patient Completed: Chest;Right Arm;Left Arm;Abdomen;Right upper leg;Left upper leg;Left lower leg (including foot) Bathing: 3: Mod-Patient completes 5-7 23f 10 parts or 50-74%  FIM - Upper Body Dressing/Undressing Upper body dressing/undressing steps patient completed: Thread/unthread right sleeve of pullover shirt/dresss;Thread/unthread left sleeve of pullover shirt/dress;Put head through opening of pull over shirt/dress;Pull shirt over trunk Upper body dressing/undressing: 5: Set-up assist to: Obtain clothing/put away FIM - Lower Body Dressing/Undressing Lower body dressing/undressing steps patient completed: Pull pants up/down Lower body dressing/undressing: 1: Total-Patient completed less than 25% of tasks  FIM - Toileting Toileting: 0: Activity did not occur  FIM - Archivist Transfers: 0-Activity did not occur  FIM - Physiological scientist Devices: Arm rests;Bed rails;Sliding board Bed/Chair Transfer: 3: Supine > Sit: Mod A (lifting assist/Pt. 50-74%/lift 2 legs;3: Sit > Supine: Mod A (lifting assist/Pt. 50-74%/lift 2 legs);3: Bed > Chair or W/C: Mod A (lift or lower assist);3: Chair or W/C > Bed: Mod A (lift or lower assist)  FIM - Locomotion: Wheelchair Distance: 100 Locomotion: Wheelchair: 2: Travels 50 - 149 ft with supervision, cueing or coaxing FIM -  Locomotion: Ambulation Ambulation/Gait Assistance: Not tested (comment) Locomotion: Ambulation: 0: Activity did not occur  Comprehension Comprehension Mode: Auditory Comprehension: 5-Understands complex 90% of the time/Cues < 10% of the time  Expression Expression Mode: Verbal Expression: 5-Expresses complex 90% of the time/cues < 10% of the time  Social Interaction Social Interaction: 6-Interacts appropriately with others with medication or extra time (anti-anxiety, antidepressant).  Problem Solving Problem Solving: 5-Solves complex 90% of the time/cues < 10% of the time  Memory Memory: 5-Recognizes or recalls 90% of the time/requires cueing < 10% of the time  Medical Problem List and Plan  1. 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 DVT Prophylaxis/Anticoagulation: Xarelto. Monitor for any bleeding episodes  3. Pain Management: Hydrocodone as needed. Monitor with increased mobility  4. Neuropsych: This patient is capable of making decisions on her own behalf.   -needs regular positive reinforcement 5. Sarcoidosis. Continue chronic prednisone. Patient with pronounced bilateral lower extremity weakness  6. Diabetes mellitus with peripheral neuropathy. uncontrolled Hemoglobin A1c 7.4. Amaryl increased to 8 mg daily. Checking blood sugars a.c. and at bedtime , resumed metformin 500mg  BID.  Need to watch out for low am sugars. Will hold pm metformin for  now. 7. Hypertension. Lopressor 25 mg twice a day. Monitor with increased mobility  8. Escherichia coli urinary tract infection.  po ceftin   9. Urinary retention continue Flomax, discontinued Enablex/ still some retention at times 10. Constipation: augmented bowel regimen with results.  LOS (Days) 8 A FACE TO FACE EVALUATION WAS PERFORMED  Sabrinia Prien T 07/08/2013, 8:37 AM

## 2013-07-08 NOTE — Progress Notes (Signed)
INITIAL NUTRITION ASSESSMENT  DOCUMENTATION CODES Per approved criteria  -Not Applicable   INTERVENTION: Add Ensure Complete po prn daily, each supplement provides 350 kcal and 13 grams of protein. RD to continue to follow nutrition care plan.  NUTRITION DIAGNOSIS: Inadequate oral intake related to variable appetite as evidenced by pt report.   Goal: Intake to meet >90% of estimated nutrition needs.  Monitor:  weight trends, lab trends, I/O's, PO intake, supplement tolerance  Reason for Assessment: Malnutrition Screening Tool  72 y.o. 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 Admitted 11/19 with pulmonary embolus and acute DVT of LLE. Also with E. Coli UTI.  Plan for d/c on 12/11. Oral intake is variable, ranging from 0 - 100%. She reports that she is eating fair. She states that she is a "finicky eater." Very thin upper and lower extremities. She states that she has not had Boost or Ensure and doesn't feel that she needs it right now but is open to it "if it gets bad enough", referring to her eating.  We discussed high protein foods and encouraged increased intake of them. Pt verbalized understanding.  Height: Ht Readings from Last 1 Encounters:  07/04/13 5\' 6"  (1.676 m)    Weight: Wt Readings from Last 1 Encounters:  07/08/13 112 lb 14 oz (51.2 kg)    Ideal Body Weight: 130 lb  % Ideal Body Weight: 86%  Wt Readings from Last 10 Encounters:  07/08/13 112 lb 14 oz (51.2 kg)  06/30/13 119 lb 4.3 oz (54.1 kg)  04/25/13 115 lb (52.164 kg)  04/25/13 116 lb (52.617 kg)  04/24/13 116 lb (52.617 kg)  12/31/12 116 lb (52.617 kg)    Usual Body Weight: 115 lb  % Usual Body Weight: 97%  BMI:  Body mass index is 18.23 kg/(m^2). Normal weight  Estimated Nutritional Needs: Kcal: 1500 - 1800 Protein: 60 - 70 g Fluid: 1.5 - 1.8 liters  Skin:   Stage I pressure ulcer to sacrum Diabetic toe ulcer  Diet Order: Carb Control Medium  EDUCATION NEEDS: -No education needs identified at this time   Intake/Output Summary (Last 24 hours) at 07/08/13 1538 Last data filed at 07/08/13 1234  Gross per 24 hour  Intake    480 ml  Output   1197 ml  Net   -717 ml    Last BM: 12/1  Labs:  No results found for this basename: NA, K, CL, CO2, BUN, CREATININE, CALCIUM, MG, PHOS, GLUCOSE,  in the last 168 hours  CBG (last 3)   Recent Labs  07/08/13 0733 07/08/13 0828 07/08/13 1207  GLUCAP 68* 159* 220*    Scheduled Meds: . calcium-vitamin D  1 tablet Oral Q breakfast  . glimepiride  8 mg Oral Q breakfast  . insulin aspart  0-5 Units Subcutaneous QHS  . [START ON 07/09/2013] metFORMIN  500 mg Oral Q breakfast  . metoprolol tartrate  25 mg Oral BID  . multivitamin with minerals  1 tablet Oral Daily  . predniSONE  10 mg Oral Q breakfast  . Rivaroxaban  15 mg Oral BID WC   Followed by  . [START ON 07/18/2013] rivaroxaban  20 mg Oral Q supper  . senna-docusate  2 tablet Oral BID  . tamsulosin  0.4 mg Oral Daily    Continuous Infusions:   Past Medical History  Diagnosis Date  . Sarcoidosis   . Hiatal hernia   .  Diabetes mellitus without complication   . Osteoporosis     Past Surgical History  Procedure Laterality Date  . Abdominal hysterectomy    . Joint replacement      toe  . Lung lobectomy Left     Lower    Jarold Motto MS, [place], [place] Pager: [phone] After-hours pager: [phone]

## 2013-07-08 NOTE — Progress Notes (Signed)
Recreational Therapy Session Note  Patient Details  Name: EVANY SCHECTER MRN: [id number] Date of Birth: 1941-06-19 Today's Date: 07/08/2013  Pain: c/o pain, unrated, RN aware, next med due at lunchtime Skilled Therapeutic Interventions/Progress Updates: Session focused on overall activity tolerance, posture, dynamic sitting balance while opening mail and preparing peanut butter crackers seated edge of mat.  Pt varied in amount of assist needed for balance, supervision-mod assist. Pt performed slideboard transfers with close S-min assist to/from w/c and mat.  Pt with questions about discharge planning, specifically if team would recommend further rehab at another facility after discharge from [facility].  Discussed the above with PT, OT, & SW.  Pt's sister Harriett Sine present & observing.  Therapy/Group: Co-Treatment  Caylan Chenard 07/08/2013, 2:33 PM

## 2013-07-08 NOTE — Progress Notes (Signed)
Occupational Therapy Session Note  Patient Details  Name: KATLIN BORTNER MRN: [id number] Date of Birth: 21-Jul-1941  Today's Date: 07/08/2013 Time: 1191-4782 Time Calculation (min): 45 min  Short Term Goals: Week 1:  OT Short Term Goal 1 (Week 1): Bath:  Mod assist with bath to include lateral leans for peri area and buttocks OT Short Term Goal 2 (Week 1): LB Dressing:  Mod assist to include lateral leans OT Short Term Goal 3 (Week 1): Toilet Transfer: Mod assist with squat pivot OT Short Term Goal 4 (Week 1): Toileting:  Mod assist with 2/3 tasks to include lateral leans  Skilled Therapeutic Interventions/Progress Updates:    1:1 self care retraining at shower level with focus on w/c setup in prep for transfer, slide board transfer wc/ to shower bench, sit to stands, standing tolerance and balance for clothing management, activity tolerance, sitting balance, LB dressing with recall to dress right LE first. Pt continues to be "down" on herself despite encouragement. Min A slide board transfers with min VC for sequence and extra time.  Therapy Documentation Precautions:  Precautions Precautions: Fall Restrictions Weight Bearing Restrictions: No Pain: 8/10 in back legs and feet- RN made aware but no more meds due at this time. Repositioned- showered with warm water for comfort See FIM for current functional status  Therapy/Group: Individual Therapy  [name] [facility] 07/08/2013, 3:02 PM

## 2013-07-08 NOTE — Progress Notes (Addendum)
Physical Therapy Session Note  Patient Details  Name: Elizabeth Drake MRN: [id number] Date of Birth: 1941/05/29  Today's Date: 07/08/2013 Time: Treatment Session 1: 7829-5621; Treatment Session 2: 3086-5784 Time Calculation (min): Treatment Session 1: 60 min; Treatment Session 2:  Short Term Goals: Week 1:  PT Short Term Goal 1 (Week 1): Pt to maintain postural control during static sitting >38min w/ min A PT Short Term Goal 2 (Week 1): Pt to maintain postural control during dynamic sitting w/ mod A  PT Short Term Goal 3 (Week 1): Pt to perform bed mobility w/ mod A PT Short Term Goal 4 (Week 1): Pt to transfer bed<>w/c w/ mod A  Skilled Therapeutic Interventions/Progress Updates:  Treatment Session 1:  1:1. Pt received semi-reclined in bed, awake and ready for therapy. Pt req min A for donning socks, but max A for donning shoes. Pt req mod A for bed mobility as well as SBT bed>w/c<>tx mat. Focus majority of tx session on seated therex for neck, core and B LE strengthening as well as static/dynamic sitting balance. Supported seated therex included 2x10reps of: chin tucks, B scap retraction, hip add, marching (R LE assisted), toe/heel raises as well as LAQ. Pt fatiguing very quickly req rest breaks between reps as well as supine rest break upon completion. Static sitting balance targeted w/ pt sitting on EOM and B UE supported on backrest of chair. With therapist facilitating anterior pelvic tilt, pt able to demonstrate improved neck and trunk extension. Req verbal cues to sustain >15sec 2/2 fatigue. Transition to dynamic sitting balance w/ B UE supported on table in front of pt w/ therapist again facilitating anterior pelvic tilt. Pt managing cards into suites w/ R UE while stabilizing self w/ L UE and intermittently w/ R UE. Pt fatiguing very quickly during seated balance exercises w/ overall mod A from therapist. Pt sitting in w/c at end of session w/ all needs in reach.   Treatment Session 2:   1:1. Pt received semi-reclined in bed, pt verbalized feeling overwhelmed about d/c planning as well as want to return home at [facility]. Pt verbally educated on recovery time, progress seen and SNF option. Pt verbalized understanding, but again verbalized wish to go home. Pt also said she would call her husband tonight to discuss if they have the ability to modify their bathroom at home for w/c accessibility. Pt req mod A for bed mobility and SBT bed>w/c this session. Focus on dynamic sitting balance in wheelchair with pt watering multiple plants and therapist facilitating improved trunk positioning for improved reach. Pt sitting in w/c at end of session w/ all needs in reach.   Therapy Documentation Precautions:  Precautions Precautions: Fall Restrictions Weight Bearing Restrictions: No Pain: Pain Assessment Pain Assessment: 0-10 Pain Score: 7  Pain Location: Back Pain Orientation: Lower Pain Descriptors / Indicators: Aching Patients Stated Pain Goal: 4 Pain Intervention(s): Medication (See eMAR)  See FIM for current functional status  Therapy/Group: Individual Therapy  Denzil Hughes 07/08/2013, 3:58 PM

## 2013-07-09 ENCOUNTER — Inpatient Hospital Stay (HOSPITAL_COMMUNITY): Payer: Medicare Other

## 2013-07-09 ENCOUNTER — Encounter (HOSPITAL_COMMUNITY): Payer: No Typology Code available for payment source | Admitting: Occupational Therapy

## 2013-07-09 ENCOUNTER — Inpatient Hospital Stay (HOSPITAL_COMMUNITY): Payer: No Typology Code available for payment source | Admitting: Occupational Therapy

## 2013-07-09 LAB — GLUCOSE, CAPILLARY
Glucose-Capillary: 80 mg/dL (ref 70–99)
Glucose-Capillary: 228 mg/dL — ABNORMAL HIGH (ref 70–99)
Glucose-Capillary: 272 mg/dL — ABNORMAL HIGH (ref 70–99)
Glucose-Capillary: 223 mg/dL — ABNORMAL HIGH (ref 70–99)

## 2013-07-09 NOTE — Progress Notes (Signed)
Recreational Therapy Session Note  Patient Details  Name: Elizabeth Drake MRN: [id number] Date of Birth: 1941/07/21 Today's Date: 07/09/2013  Pain: 3/10, leg pain, RN aware Skilled Therapeutic Interventions/Progress Updates: Session focused on activity tolerance, sitting balance & discharge planning.  Pt states she does not want to bother family & friends to assist her at home.  Extensive discussion with pt about her continued need for physical assistance at discharge and the need to train her husband and caregivers.  Discussion did include SNF in which pt state she did not want to go there, had been there before and it wasn't helpful.    ThSIMPSON,Janelle Spellman 07/09/2013, 4:13 PM

## 2013-07-09 NOTE — Progress Notes (Signed)
Occupational Therapy Note  Patient Details  Name: Elizabeth Drake MRN: [id number] Date of Birth: [birth year] Today's Date: 07/09/2013  Time: 1300-1345 Pt denies pain Individual Therapy  Pt asleep seated in w/c upon arrival but easily aroused.  Pt agreeable to participate in therapy although she stated that she was exhausted from morning therapies.  Pt initially practiced scoot transfers w/c<>tub bench.  Pt performed scoot transfer to tub bench with steady A but required assistance to lift one leg into tub.  Pt required assistance lifting BLE out of tub and performed scoot/squat transfer to w/c.  Pt performed transfer X 3 before transitioning to bedroom in ADL apartment to practice w/c<>bed transfers with steady A to transfer to bed and mod A to transfer from bed to w/c.  Pt stated that she was "giving out" as evidenced by increasing assistance provided by therapist for transfers.  Pt rolled partially back to room before asking therapist for assistance.  Pt required min A for w/c->bed transfer and sit->supine.  Pt stated she was fatigued and wasn't able to continue.  Pt missed 15 mins skilled OT services.   Lavone Neri [facility] 07/09/2013, 1:47 PM

## 2013-07-09 NOTE — Progress Notes (Addendum)
Occupational Therapy Session Note  Patient Details  Name: Elizabeth Drake MRN: [id number] Date of Birth: [birth year]  Today's Date: 07/09/2013 Time: 1100-1130 Time Calculation (min): 30 min  Short Term Goals: Week 1:  OT Short Term Goal 1 (Week 1): Bath:  Mod assist with bath to include lateral leans for peri area and buttocks OT Short Term Goal 2 (Week 1): LB Dressing:  Mod assist to include lateral leans OT Short Term Goal 3 (Week 1): Toilet Transfer: Mod assist with squat pivot OT Short Term Goal 4 (Week 1): Toileting:  Mod assist with 2/3 tasks to include lateral leans  Skilled Therapeutic Interventions/Progress Updates:    1:1 focus on transfer training with focus on squat pivot transfers w/c <>mat 5 times with close supervision to steady A with focus on bottom clearance  With transfers. Sit to stands with grab bar simulating using the sink or counter; pt able to perform sit to stands 4x pushing up with both UE on arm rests with steady A (with attention to decr hyperextension of right knee).    Therapy Documentation Precautions:  Precautions Precautions: Fall Restrictions Weight Bearing Restrictions: No Vital Signs:   Pain: ongoing - RN aware See FIM for current functional status  Therapy/Group: Individual Therapy  [name] [facility] 07/09/2013, 2:00 PM

## 2013-07-09 NOTE — Plan of Care (Signed)
Problem: RH PAIN MANAGEMENT Goal: RH STG PAIN MANAGED AT OR BELOW PT'S PAIN GOAL Pain level managed at or below 2 with min assistance  Outcome: Not Progressing Requests pain medication every 3 hours or so- pain level always at 7-8/10

## 2013-07-09 NOTE — Progress Notes (Signed)
Inpatient Diabetes Program Recommendations  AACE/ADA: New Consensus Statement on Inpatient Glycemic Control (2013)  Target Ranges:  Prepandial:   less than 140 mg/dL      Peak postprandial:   less than 180 mg/dL (1-2 hours)      Critically ill patients:  140 - 180 mg/dL   Post-prandial hyperglycemia:  Inpatient Diabetes Program Recommendations Correction (SSI): please add sensitive correction tidwc or meal coverage of 3 units tidwc. Oral Agents: Please decrease Amaryl to 2-4 units per day or discontinue (Amaryl has a long half-life throughout the day, esp at high doses))  Thank you, Lenor Coffin, RN, CNS, Diabetes Coordinator [phone])

## 2013-07-09 NOTE — Progress Notes (Signed)
Subjective/Complaints:    no new issues. Pain controlled. Slept well. Frustrated over lack of perceived progress Review of Systems - otherwise negative  Objective: Vital Signs: Blood pressure 118/84, pulse 84, temperature 97.4 F (36.3 C), temperature source Oral, resp. rate 17, height 5\' 6"  (1.676 m), weight 49.85 kg (109 lb 14.4 oz), SpO2 98.00%. No results found. Results for orders placed during the hospital encounter of 06/30/13 (from the past 72 hour(s))  GLUCOSE, CAPILLARY     Status: Abnormal   Collection Time    07/06/13 11:36 AM      Result Value Range   Glucose-Capillary 277 (*) 70 - 99 mg/dL   Comment 1 Notify RN    URINE CULTURE     Status: None   Collection Time    07/06/13 11:45 AM      Result Value Range   Specimen Description URINE, CATHETERIZED     Special Requests NONE     Culture  Setup Time       Value: 07/06/2013 17:27     Performed at [facility] Count       Value: 15,000 COLONIES/ML     Performed at [facility]   Culture       Value: YEAST     Performed at [facility]   Report Status 07/07/2013 FINAL    GLUCOSE, CAPILLARY     Status: Abnormal   Collection Time    07/06/13  4:28 PM      Result Value Range   Glucose-Capillary 251 (*) 70 - 99 mg/dL   Comment 1 Notify RN    GLUCOSE, CAPILLARY     Status: Abnormal   Collection Time    07/06/13  7:53 PM      Result Value Range   Glucose-Capillary 253 (*) 70 - 99 mg/dL  GLUCOSE, CAPILLARY     Status: None   Collection Time    07/07/13  7:34 AM      Result Value Range   Glucose-Capillary 90  70 - 99 mg/dL   Comment 1 Notify RN    GLUCOSE, CAPILLARY     Status: Abnormal   Collection Time    07/07/13 11:37 AM      Result Value Range   Glucose-Capillary 219 (*) 70 - 99 mg/dL   Comment 1 Notify RN    GLUCOSE, CAPILLARY     Status: Abnormal   Collection Time    07/07/13  4:48 PM      Result Value Range   Glucose-Capillary 250 (*) 70 - 99 mg/dL   Comment 1  Notify RN    GLUCOSE, CAPILLARY     Status: Abnormal   Collection Time    07/07/13  8:30 PM      Result Value Range   Glucose-Capillary 243 (*) 70 - 99 mg/dL  GLUCOSE, CAPILLARY     Status: Abnormal   Collection Time    07/08/13  7:33 AM      Result Value Range   Glucose-Capillary 68 (*) 70 - 99 mg/dL   Comment 1 Notify RN    GLUCOSE, CAPILLARY     Status: Abnormal   Collection Time    07/08/13  8:28 AM      Result Value Range   Glucose-Capillary 159 (*) 70 - 99 mg/dL   Comment 1 Notify RN    GLUCOSE, CAPILLARY     Status: Abnormal   Collection Time    07/08/13 12:07 PM  Result Value Range   Glucose-Capillary 220 (*) 70 - 99 mg/dL   Comment 1 Notify RN    GLUCOSE, CAPILLARY     Status: Abnormal   Collection Time    07/08/13  4:53 PM      Result Value Range   Glucose-Capillary 276 (*) 70 - 99 mg/dL   Comment 1 Notify RN    GLUCOSE, CAPILLARY     Status: Abnormal   Collection Time    07/08/13  8:34 PM      Result Value Range   Glucose-Capillary 199 (*) 70 - 99 mg/dL  GLUCOSE, CAPILLARY     Status: None   Collection Time    07/09/13  7:34 AM      Result Value Range   Glucose-Capillary 80  70 - 99 mg/dL   Comment 1 Notify RN       HEENT: normal Cardio: RRR and No murmur Resp: CTA B/L and Unlabored GI: BS positive and Nondistended Extremity:  Pulses positive and No Edema Skin:   Intact Neuro: Alert/Oriented, Cranial Nerve II-XII normal, Normal Sensory and Abnormal Motor 4+/5 bilateral deltoid, bicep, tricep, grip,   2   in the right hip flexor 3 minus knee extensor 2   right ankle dorsiflexor plantar flexor  3 minus left hip flexor knee extensor ankle dorsiflexor plantar flexor Musc/Skel:  Other Atrophy in the right leg General no acute distress   Assessment/Plan: 1. Functional deficits secondary to  deconditioning after pulmonary embolism and DVT in a patient with chronic paraparesis  which require 3+ hours per day of interdisciplinary therapy in a comprehensive  inpatient rehab setting. Physiatrist is providing close team supervision and 24 hour management of active medical problems listed below. Physiatrist and rehab team continue to assess barriers to discharge/monitor patient progress toward functional and medical goals.  Pt will need some assistance once home. SW looking the possibility of hired help at home.   FIM: FIM - Bathing Bathing Steps Patient Completed: Chest;Right Arm;Left Arm;Abdomen;Right upper leg;Left upper leg;Left lower leg (including foot) Bathing: 3: Mod-Patient completes 5-7 42f 10 parts or 50-74%  FIM - Upper Body Dressing/Undressing Upper body dressing/undressing steps patient completed: Thread/unthread right sleeve of pullover shirt/dresss;Thread/unthread left sleeve of pullover shirt/dress;Put head through opening of pull over shirt/dress;Pull shirt over trunk Upper body dressing/undressing: 5: Set-up assist to: Obtain clothing/put away FIM - Lower Body Dressing/Undressing Lower body dressing/undressing steps patient completed: Pull pants up/down Lower body dressing/undressing: 1: Total-Patient completed less than 25% of tasks  FIM - Toileting Toileting: 0: Activity did not occur  FIM - Archivist Transfers: 0-Activity did not occur  FIM - Banker Devices: Sliding board;Arm rests;HOB elevated;Bed rails Bed/Chair Transfer: 3: Supine > Sit: Mod A (lifting assist/Pt. 50-74%/lift 2 legs;3: Sit > Supine: Mod A (lifting assist/Pt. 50-74%/lift 2 legs);3: Chair or W/C > Bed: Mod A (lift or lower assist);3: Bed > Chair or W/C: Mod A (lift or lower assist)  FIM - Locomotion: Wheelchair Distance: 100 Locomotion: Wheelchair: 2: Travels 50 - 149 ft with supervision, cueing or coaxing FIM - Locomotion: Ambulation Ambulation/Gait Assistance: Not tested (comment) Locomotion: Ambulation: 0: Activity did not occur  Comprehension Comprehension Mode: Auditory Comprehension:  5-Follows basic conversation/direction: With no assist  Expression Expression Mode: Verbal Expression: 5-Expresses basic 90% of the time/requires cueing < 10% of the time.  Social Interaction Social Interaction: 6-Interacts appropriately with others with medication or extra time (anti-anxiety, antidepressant).  Problem Solving Problem Solving: 5-Solves basic problems:  With no assist  Memory Memory: 5-Recognizes or recalls 90% of the time/requires cueing < 10% of the time  Medical Problem List and Plan  1. 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 DVT Prophylaxis/Anticoagulation: Xarelto. Monitor for any bleeding episodes  3. Pain Management: Hydrocodone as needed. Monitor with increased mobility  4. Neuropsych: This patient is capable of making decisions on her own behalf.   -needs regular positive reinforcement 5. Sarcoidosis. Continue chronic prednisone. Patient with pronounced bilateral lower extremity weakness  6. Diabetes mellitus with peripheral neuropathy. uncontrolled Hemoglobin A1c 7.4. Amaryl increased to 8 mg daily. Checking blood sugars a.c. and at bedtime , metformin changed to 500mg  qd--(pm dose held).  Need to watch out for low am sugars.--follow for pattern (80 this am) 7. Hypertension. Lopressor 25 mg twice a day. Monitor with increased mobility  8. Escherichia coli urinary tract infection.  po ceftin   9. Urinary retention continue Flomax. Voiding patterns overall improved 10. Constipation: augmented bowel regimen with results.  LOS (Days) 9 A FACE TO FACE EVALUATION WAS PERFORMED  Adi Seales T 07/09/2013, 8:32 AM

## 2013-07-09 NOTE — Progress Notes (Signed)
Physical Therapy Session Note  Patient Details  Name: Elizabeth Drake MRN: [id number] Date of Birth: [birth year]  Today's Date: 07/09/2013 Time: 0930-1030 Time Calculation (min): 60 min  Short Term Goals: Week 1:  PT Short Term Goal 1 (Week 1): Pt to maintain postural control during static sitting >80min w/ min A PT Short Term Goal 2 (Week 1): Pt to maintain postural control during dynamic sitting w/ mod A  PT Short Term Goal 3 (Week 1): Pt to perform bed mobility w/ mod A PT Short Term Goal 4 (Week 1): Pt to transfer bed<>w/c w/ mod A  Skilled Therapeutic Interventions/Progress Updates:  1:1. Pt received sitting in w/c at sink, just completed grooming w/ nurse tech in room. Focus this session on SBT, dynamic sitting balance as well as w/c propulsion for B UE strength/endurance. Pt able to propel w/c 100' and 50'x4 this session w/ B UE to target strength and endurance. Pt fatiguing very quickly, asking therapist to take over. Pt w/ verbalizations of gross pain this session, but primarily in B LE. RN aware and present to provide pain meds. Pt practiced unlevel SBT couch<>w/c<>car, req mod A overall. Pt demonstrating difficulty maintaining forward weight shift for improved leverage this session. Supported dynamic seated balance targeted this session by pt assisting w/ loading dishes into diswasher, pressing buttons in elevator and observing plants and ornaments on tree in main lobby. Pt able to pull self forward in chair to promote anterior pelvic tilt for single UE reaching outside of BOS in all directions w/ single UE support and min guard-min A by therapist. Recreational therapist present at end of session to assist w/ dynamic sitting balance tasks as well as continued discussion regarding d/c planning. Emphasis on training additional caregiver besides husband due to his limited physical ability to assist pt. Pt again stating that she does not want to bother anyone. Will continue to work with Arts development officer regarding d/c planning (home vs. SNF) and caregiver availability.   Therapy Documentation Precautions:  Precautions Precautions: Fall Restrictions Weight Bearing Restrictions: No   Pain: Pain Assessment Pain Assessment: 0-10 Pain Score: 3  Pain Location: Leg Pain Orientation: Left Pain Descriptors / Indicators: Aching Pain Frequency: Constant Pain Onset: On-going Patients Stated Pain Goal: 2 Pain Intervention(s): Medication (See eMAR) (norco 2 tabs po prn)  See FIM for current functional status  Therapy/Group: Individual Therapy  Denzil Hughes 07/09/2013, 12:16 PM

## 2013-07-09 NOTE — Progress Notes (Signed)
Occupational Therapy Session Note  Patient Details  Name: Elizabeth Drake MRN: [id number] Date of Birth: 1940-09-28  Today's Date: 07/09/2013 Time: 0820-0915 Time Calculation (min): 55 min  Short Term Goals: Week 1:  OT Short Term Goal 1 (Week 1): Bath:  Mod assist with bath to include lateral leans for peri area and buttocks OT Short Term Goal 2 (Week 1): LB Dressing:  Mod assist to include lateral leans OT Short Term Goal 3 (Week 1): Toilet Transfer: Mod assist with squat pivot OT Short Term Goal 4 (Week 1): Toileting:  Mod assist with 2/3 tasks to include lateral leans  Skilled Therapeutic Interventions/Progress Updates:    1:1 self care retraining at sink level - pt's choice.  Focus on LB bathing and dressing; requiring steady A to hold right LE in crossed position to don socks and shoes, squat pivot transfer bed to w/c with min  A, sit to stand at sink with min A ( with extra rest time before stood); activity tolerance, standing tolerance. Discussed d/c planning, assistance needs, progress pt has made in OT, home setup, option for SNF (however pt reported she went to one before and doesn't want to go again.) pt able to maneuver her w/c around room to obtain items mod I.   Therapy Documentation Precautions:  Precautions Precautions: Fall Restrictions Weight Bearing Restrictions: No Pain: 6/10in feet, LEs and back - RN aware; put lotion on back for comfort  See FIM for current functional status  Therapy/Group: Individual Therapy  [name] [facility] 07/09/2013, 9:05 AM

## 2013-07-10 ENCOUNTER — Inpatient Hospital Stay (HOSPITAL_COMMUNITY): Payer: Medicare Other | Admitting: Physical Therapy

## 2013-07-10 ENCOUNTER — Inpatient Hospital Stay (HOSPITAL_COMMUNITY): Payer: No Typology Code available for payment source | Admitting: Occupational Therapy

## 2013-07-10 ENCOUNTER — Inpatient Hospital Stay (HOSPITAL_COMMUNITY): Payer: Medicare Other

## 2013-07-10 ENCOUNTER — Encounter (HOSPITAL_COMMUNITY): Payer: No Typology Code available for payment source | Admitting: Occupational Therapy

## 2013-07-10 DIAGNOSIS — D869 Sarcoidosis, unspecified: Secondary | ICD-10-CM

## 2013-07-10 DIAGNOSIS — G8220 Paraplegia, unspecified: Secondary | ICD-10-CM

## 2013-07-10 DIAGNOSIS — R5381 Other malaise: Secondary | ICD-10-CM

## 2013-07-10 LAB — GLUCOSE, CAPILLARY
Glucose-Capillary: 83 mg/dL (ref 70–99)
Glucose-Capillary: 208 mg/dL — ABNORMAL HIGH (ref 70–99)
Glucose-Capillary: 253 mg/dL — ABNORMAL HIGH (ref 70–99)
Glucose-Capillary: 194 mg/dL — ABNORMAL HIGH (ref 70–99)

## 2013-07-10 MED ORDER — METFORMIN HCL 500 MG PO TABS
1000.0000 mg | ORAL_TABLET | Freq: Every day | ORAL | Status: DC
  Administered 2013-07-11 – 2013-07-18 (×8): 1000 mg via ORAL
  Filled 2013-07-10 (×9): qty 2

## 2013-07-10 NOTE — Progress Notes (Signed)
Physical Therapy Weekly Progress Note  Patient Details  Name: Elizabeth Drake MRN: [id number] Date of Birth: 17-Dec-1940  Today's Date: 07/10/2013 Time: 1430-1500 Time Calculation (min): 30 min  Pt continues to be very motivated to participate in physical therapy and has recently achieved 4/4 short term goals w/ continued progress towards respective long term goals. Pt's progress attributed to improved functional endurance, increased strength and functional use of B UE to assist w/ maintaining sitting balance, bed mobility and transfers. Pt to benefit from customized w/c evaluation to improve positioning 2/2 physical deformities to prevent skin breakdown and improve mobility therefore maximizing level of functional independence. Long term goals regarding dynamic sitting balance and w/c propulsion distance downgraded 2/2 continued need for assist as well as decreased functional endurance. Pt continues to demonstrate poor insight regarding continued need for assistance upon d/c home. Pt's husband only able to provide limited physical assistance and pt reluctant to ask for additional help from friends family. Pt has been educated on SNF option, however, at this point pt strongly wishes to d/c to home environment.   Patient continues to demonstrate the following deficits: decreased functional endurance, decreased balance, decreased strength, decreased anticipatory awareness and therefore will continue to benefit from skilled PT intervention to enhance overall performance with the identified impairments.  Patient progressing toward long term goals..  Continue plan of care.  PT Short Term Goals Week 1:  PT Short Term Goal 1 (Week 1): Pt to maintain postural control during static sitting >32min w/ min A PT Short Term Goal 1 - Progress (Week 1): Met PT Short Term Goal 2 (Week 1): Pt to maintain postural control during dynamic sitting w/ mod A  PT Short Term Goal 2 - Progress (Week 1): Met PT Short Term Goal 3  (Week 1): Pt to perform bed mobility w/ mod A PT Short Term Goal 3 - Progress (Week 1): Met PT Short Term Goal 4 (Week 1): Pt to transfer bed<>w/c w/ mod A PT Short Term Goal 4 - Progress (Week 1): Met Week 2:  PT Short Term Goal 1 (Week 2): STGs=LTGs due to anticipated LOS  Skilled Therapeutic Interventions/Progress Updates:  1:1. Pt received sitting in w/c, ready for therapy. Focus this session on dynamic sitting balance and functional transfers. Pt able to perform squat pivot t/f w/c<>tx mat w/ mod A for bottom clearance to prevent skin breakdown. Pt able to tolerate sitting on edge of mat x3min to trim the Christmas tree. Pt req close (S) to min A for static sitting balance, but req min-mod A/facilitation of BOS for improved trunk positioning and single UE reaching. Pt able to propel w/c 100' w/ B UE and supervision before onset of fatigue. Pt req mod A for t/f sit>sup in bed. Pt semi-reclined in bed at end of session w/ all needs in reach.   Therapy Documentation Precautions:  Precautions Precautions: Fall Restrictions Weight Bearing Restrictions: No Pain: Pain Assessment Pain Assessment: 0-10 Pain Score: 7  Pain Type: Acute pain Pain Location: Leg Pain Orientation: Right;Left Pain Radiating Towards: back Pain Descriptors / Indicators: Aching Pain Frequency: Intermittent Pain Onset: On-going Patients Stated Pain Goal: 2 Pain Intervention(s): Medication (See eMAR) (norco 2 tabs po prn) See FIM for current functional status  Therapy/Group: Individual Therapy  Denzil Hughes 07/10/2013, 4:37 PM

## 2013-07-10 NOTE — Progress Notes (Signed)
Occupational Therapy Note  Patient Details  Name: Elizabeth Drake MRN: [id number] Date of Birth: [birth year] Today's Date: 07/10/2013  Pt missed 30 min of therapy due to nursing care needs. Assisted pt back into w/c and ready her for her next physical therapy session.   [name] [facility] 07/10/2013, 2:33 PM

## 2013-07-10 NOTE — Progress Notes (Signed)
Subjective/Complaints:    no new issues. Pain controlled. Slept well. Frustrated over lack of perceived progress Review of Systems - otherwise negative  Objective: Vital Signs: Blood pressure 130/59, pulse 83, temperature 97.6 F (36.4 C), temperature source Oral, resp. rate 18, height 5\' 6"  (1.676 m), weight 52.663 kg (116 lb 1.6 oz), SpO2 96.00%. No results found. Results for orders placed during the hospital encounter of 06/30/13 (from the past 72 hour(s))  GLUCOSE, CAPILLARY     Status: Abnormal   Collection Time    07/07/13 11:37 AM      Result Value Range   Glucose-Capillary 219 (*) 70 - 99 mg/dL   Comment 1 Notify RN    GLUCOSE, CAPILLARY     Status: Abnormal   Collection Time    07/07/13  4:48 PM      Result Value Range   Glucose-Capillary 250 (*) 70 - 99 mg/dL   Comment 1 Notify RN    GLUCOSE, CAPILLARY     Status: Abnormal   Collection Time    07/07/13  8:30 PM      Result Value Range   Glucose-Capillary 243 (*) 70 - 99 mg/dL  GLUCOSE, CAPILLARY     Status: Abnormal   Collection Time    07/08/13  7:33 AM      Result Value Range   Glucose-Capillary 68 (*) 70 - 99 mg/dL   Comment 1 Notify RN    GLUCOSE, CAPILLARY     Status: Abnormal   Collection Time    07/08/13  8:28 AM      Result Value Range   Glucose-Capillary 159 (*) 70 - 99 mg/dL   Comment 1 Notify RN    GLUCOSE, CAPILLARY     Status: Abnormal   Collection Time    07/08/13 12:07 PM      Result Value Range   Glucose-Capillary 220 (*) 70 - 99 mg/dL   Comment 1 Notify RN    GLUCOSE, CAPILLARY     Status: Abnormal   Collection Time    07/08/13  4:53 PM      Result Value Range   Glucose-Capillary 276 (*) 70 - 99 mg/dL   Comment 1 Notify RN    GLUCOSE, CAPILLARY     Status: Abnormal   Collection Time    07/08/13  8:34 PM      Result Value Range   Glucose-Capillary 199 (*) 70 - 99 mg/dL  GLUCOSE, CAPILLARY     Status: None   Collection Time    07/09/13  7:34 AM      Result Value Range   Glucose-Capillary 80  70 - 99 mg/dL   Comment 1 Notify RN    GLUCOSE, CAPILLARY     Status: Abnormal   Collection Time    07/09/13 11:48 AM      Result Value Range   Glucose-Capillary 228 (*) 70 - 99 mg/dL   Comment 1 Notify RN    GLUCOSE, CAPILLARY     Status: Abnormal   Collection Time    07/09/13  5:28 PM      Result Value Range   Glucose-Capillary 272 (*) 70 - 99 mg/dL   Comment 1 Notify RN    GLUCOSE, CAPILLARY     Status: Abnormal   Collection Time    07/09/13  8:36 PM      Result Value Range   Glucose-Capillary 223 (*) 70 - 99 mg/dL  GLUCOSE, CAPILLARY     Status: None   Collection  Time    07/10/13  7:37 AM      Result Value Range   Glucose-Capillary 83  70 - 99 mg/dL     HEENT: normal Cardio: RRR and No murmur Resp: CTA B/L and Unlabored GI: BS positive and Nondistended Extremity:  Pulses positive and No Edema Skin:   Intact Neuro: Alert/Oriented, Cranial Nerve II-XII normal, Normal Sensory and Abnormal Motor 4+/5 bilateral deltoid, bicep, tricep, grip,   2   in the right hip flexor 3 minus knee extensor 2   right ankle dorsiflexor plantar flexor  3 minus left hip flexor knee extensor ankle dorsiflexor plantar flexor Musc/Skel:  Other Atrophy in the right leg General no acute distress   Assessment/Plan: 1. Functional deficits secondary to  deconditioning after pulmonary embolism and DVT in a patient with chronic paraparesis  which require 3+ hours per day of interdisciplinary therapy in a comprehensive inpatient rehab setting. Physiatrist is providing close team supervision and 24 hour management of active medical problems listed below. Physiatrist and rehab team continue to assess barriers to discharge/monitor patient progress toward functional and medical goals.  Pt will need some assistance once home. SW looking the possibility of hired help at home.   FIM: FIM - Bathing Bathing Steps Patient Completed: Chest;Right Arm;Left Arm;Abdomen;Right upper leg;Left  upper leg;Right lower leg (including foot);Left lower leg (including foot) Bathing: 4: Min-Patient completes 8-9 17f 10 parts or 75+ percent  FIM - Upper Body Dressing/Undressing Upper body dressing/undressing steps patient completed: Put head through opening of pull over shirt/dress;Thread/unthread left sleeve of pullover shirt/dress;Thread/unthread right sleeve of pullover shirt/dresss Upper body dressing/undressing: 4: Min-Patient completed 75 plus % of tasks FIM - Lower Body Dressing/Undressing Lower body dressing/undressing steps patient completed: Don/Doff left sock;Don/Doff right shoe;Don/Doff left shoe;Fasten/unfasten right shoe;Fasten/unfasten left shoe;Thread/unthread right pants leg;Thread/unthread left pants leg Lower body dressing/undressing: 4: Min-Patient completed 75 plus % of tasks  FIM - Toileting Toileting: 0: Activity did not occur  FIM - Archivist Transfers: 0-Activity did not occur  FIM - Banker Devices: Sliding board;Arm rests Bed/Chair Transfer: 3: Chair or W/C > Bed: Mod A (lift or lower assist);3: Bed > Chair or W/C: Mod A (lift or lower assist)  FIM - Locomotion: Wheelchair Distance: 100 Locomotion: Wheelchair: 2: Travels 50 - 149 ft with supervision, cueing or coaxing FIM - Locomotion: Ambulation Ambulation/Gait Assistance: Not tested (comment) Locomotion: Ambulation: 0: Activity did not occur  Comprehension Comprehension Mode: Auditory Comprehension: 5-Follows basic conversation/direction: With no assist  Expression Expression Mode: Verbal Expression: 5-Expresses complex 90% of the time/cues < 10% of the time  Social Interaction Social Interaction: 6-Interacts appropriately with others with medication or extra time (anti-anxiety, antidepressant).  Problem Solving Problem Solving: 5-Solves basic problems: With no assist  Memory Memory: 5-Recognizes or recalls 90% of the time/requires cueing <  10% of the time  Medical Problem List and Plan  1. 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 DVT Prophylaxis/Anticoagulation: Xarelto. Monitor for any bleeding episodes  3. Pain Management: Hydrocodone as needed. Monitor with increased mobility  4. Neuropsych: This patient is capable of making decisions on her own behalf.   -needs regular positive reinforcement 5. Sarcoidosis. Continue chronic prednisone. Patient with pronounced bilateral lower extremity weakness  6. Diabetes mellitus with peripheral neuropathy. uncontrolled Hemoglobin A1c 7.4. Amaryl increased to 8 mg daily. Checking blood sugars a.c. and at bedtime , metformin changed to 500mg  qd--increase to 1000mg  qam--(pm dose held).  Need to watch out for low am sugars.--follow for pattern (80 this am) 7. Hypertension. Lopressor 25 mg twice a day. Monitor with increased mobility  8. Escherichia coli urinary tract infection.  po ceftin   9. Urinary retention continue Flomax. Voiding patterns overall improved, still with 50-300 residuals at times.  -encourage OOB to void 10. Constipation: augmented bowel regimen with results.  LOS (Days) 10 A FACE TO FACE EVALUATION WAS PERFORMED  Elizabeth Drake T 07/10/2013, 8:46 AM

## 2013-07-10 NOTE — Progress Notes (Signed)
Occupational Therapy Session Note  Patient Details  Name: Elizabeth Drake MRN: [id number] Date of Birth: Jun 19, 1941  Today's Date: 07/10/2013 Time: 1100-1200 Time Calculation (min): 60 min  Short Term Goals: Week 1:  OT Short Term Goal 1 (Week 1): Bath:  Mod assist with bath to include lateral leans for peri area and buttocks OT Short Term Goal 2 (Week 1): LB Dressing:  Mod assist to include lateral leans OT Short Term Goal 3 (Week 1): Toilet Transfer: Mod assist with squat pivot OT Short Term Goal 4 (Week 1): Toileting:  Mod assist with 2/3 tasks to include lateral leans  Skilled Therapeutic Interventions/Progress Updates:    1:1 self care retraining at shower level down in the AD[place] apartment to focus on tub bench transfer into bathtub with a squat pivot, sitting and standing tolerance, activity tolerance, utilizing strategies during dressing to increase independence and decr fatigue, sit to stand at sink for clothing management, standing balance and tolerance (20 sec ) with bilateral UE support for clothing management and continue discussion about d/c planning.  Therapy Documentation Precautions:  Precautions Precautions: Fall Restrictions Weight Bearing Restrictions: No    Pain:  ongoing pain - RN aware  See FIM for current functional status  Therapy/Group: Individual Therapy  [name] [facility] 07/10/2013, 3:56 PM

## 2013-07-10 NOTE — Progress Notes (Signed)
Physical Therapy Session Note  Patient Details  Name: Elizabeth Drake MRN: [id number] Date of Birth: 05-14-1941  Today's Date: 07/10/2013 Time: 9528-4132 Time Calculation (min): 60 min  Short Term Goals: Week 2:  PT Short Term Goal 1 (Week 2): STGs=LTGs due to anticipated LOS  Skilled Therapeutic Interventions/Progress Updates:    Pt received supine in bed reported she needed to urinate and requested nsg to do bladder scan.  Nsg notified and assisted with toileting to BSC,<>bed mod A +2 stand pivot transfer, +2 to complete clothing management and personal hygiene. Sitting balance and postural activities. W/c mobility in and around room and in hall x 100 ft with min A. Transfer training w/c <>mat for squat pivot transfer x 2 with  Min A to improve independence with task, pt is very motivated to return home. Pt returned to room with all needs within reach.  Therapy Documentation Precautions:  Precautions Precautions: Fall Restrictions Weight Bearing Restrictions: No     Pain: 5-6/10 premedicated per pt report                       See FIM for current functional status  Therapy/Group: Individual Therapy  Jackelyn Knife 07/10/2013, 3:58 PM

## 2013-07-11 NOTE — Progress Notes (Signed)
Subjective/Complaints:    slept well. In relatively good spirits today Review of Systems - otherwise negative  Objective: Vital Signs: Blood pressure 122/57, pulse 110, temperature 98 F (36.7 C), temperature source Oral, resp. rate 18, height 5\' 6"  (1.676 m), weight 49.5 kg (109 lb 2 oz), SpO2 100.00%. No results found. Results for orders placed during the hospital encounter of 06/30/13 (from the past 72 hour(s))  GLUCOSE, CAPILLARY     Status: Abnormal   Collection Time    07/08/13 12:07 PM      Result Value Range   Glucose-Capillary 220 (*) 70 - 99 mg/dL   Comment 1 Notify RN    GLUCOSE, CAPILLARY     Status: Abnormal   Collection Time    07/08/13  4:53 PM      Result Value Range   Glucose-Capillary 276 (*) 70 - 99 mg/dL   Comment 1 Notify RN    GLUCOSE, CAPILLARY     Status: Abnormal   Collection Time    07/08/13  8:34 PM      Result Value Range   Glucose-Capillary 199 (*) 70 - 99 mg/dL  GLUCOSE, CAPILLARY     Status: None   Collection Time    07/09/13  7:34 AM      Result Value Range   Glucose-Capillary 80  70 - 99 mg/dL   Comment 1 Notify RN    GLUCOSE, CAPILLARY     Status: Abnormal   Collection Time    07/09/13 11:48 AM      Result Value Range   Glucose-Capillary 228 (*) 70 - 99 mg/dL   Comment 1 Notify RN    GLUCOSE, CAPILLARY     Status: Abnormal   Collection Time    07/09/13  5:28 PM      Result Value Range   Glucose-Capillary 272 (*) 70 - 99 mg/dL   Comment 1 Notify RN    GLUCOSE, CAPILLARY     Status: Abnormal   Collection Time    07/09/13  8:36 PM      Result Value Range   Glucose-Capillary 223 (*) 70 - 99 mg/dL  GLUCOSE, CAPILLARY     Status: None   Collection Time    07/10/13  7:37 AM      Result Value Range   Glucose-Capillary 83  70 - 99 mg/dL  GLUCOSE, CAPILLARY     Status: Abnormal   Collection Time    07/10/13 11:58 AM      Result Value Range   Glucose-Capillary 208 (*) 70 - 99 mg/dL  GLUCOSE, CAPILLARY     Status: Abnormal   Collection Time    07/10/13  4:55 PM      Result Value Range   Glucose-Capillary 253 (*) 70 - 99 mg/dL  GLUCOSE, CAPILLARY     Status: Abnormal   Collection Time    07/10/13  8:41 PM      Result Value Range   Glucose-Capillary 194 (*) 70 - 99 mg/dL   Comment 1 Notify RN    GLUCOSE, CAPILLARY     Status: Abnormal   Collection Time    07/11/13  7:28 AM      Result Value Range   Glucose-Capillary 110 (*) 70 - 99 mg/dL     HEENT: normal Cardio: RRR and No murmur Resp: CTA B/L and Unlabored GI: BS positive and Nondistended Extremity:  Pulses positive and No Edema Skin:   Intact Neuro: Alert/Oriented, Cranial Nerve II-XII normal, Normal Sensory and Abnormal  Motor 4+/5 bilateral deltoid, bicep, tricep, grip,   2   in the right hip flexor 3   knee extensor 2 + right ankle dorsiflexor plantar flexor  3 minus left hip flexor knee extensor ankle dorsiflexor plantar flexor Musc/Skel:  Other Atrophy in the right leg General no acute distress   Assessment/Plan: 1. Functional deficits secondary to  deconditioning after pulmonary embolism and DVT in a patient with chronic paraparesis  which require 3+ hours per day of interdisciplinary therapy in a comprehensive inpatient rehab setting. Physiatrist is providing close team supervision and 24 hour management of active medical problems listed below. Physiatrist and rehab team continue to assess barriers to discharge/monitor patient progress toward functional and medical goals.  Pt will need some assistance once home. SW looking the possibility of hired help at home.   FIM: FIM - Bathing Bathing Steps Patient Completed: Chest;Right Arm;Left Arm;Abdomen;Right upper leg;Left upper leg;Right lower leg (including foot);Left lower leg (including foot) Bathing: 4: Min-Patient completes 8-9 67f 10 parts or 75+ percent  FIM - Upper Body Dressing/Undressing Upper body dressing/undressing steps patient completed: Put head through opening of pull over  shirt/dress;Thread/unthread left sleeve of pullover shirt/dress;Thread/unthread right sleeve of pullover shirt/dresss Upper body dressing/undressing: 4: Min-Patient completed 75 plus % of tasks FIM - Lower Body Dressing/Undressing Lower body dressing/undressing steps patient completed: Don/Doff left sock;Don/Doff right shoe;Don/Doff left shoe;Fasten/unfasten right shoe;Fasten/unfasten left shoe;Thread/unthread right pants leg;Thread/unthread left pants leg Lower body dressing/undressing: 4: Min-Patient completed 75 plus % of tasks  FIM - Toileting Toileting: 0: Activity did not occur  FIM - Archivist Transfers: 0-Activity did not occur  FIM - Banker Devices: Arm rests Bed/Chair Transfer: 3: Sit > Supine: Mod A (lifting assist/Pt. 50-74%/lift 2 legs);3: Chair or W/C > Bed: Mod A (lift or lower assist);3: Bed > Chair or W/C: Mod A (lift or lower assist)  FIM - Locomotion: Wheelchair Distance: 100 Locomotion: Wheelchair: 2: Travels 50 - 149 ft with supervision, cueing or coaxing FIM - Locomotion: Ambulation Ambulation/Gait Assistance: Not tested (comment) Locomotion: Ambulation: 0: Activity did not occur  Comprehension Comprehension Mode: Auditory Comprehension: 5-Follows basic conversation/direction: With no assist  Expression Expression Mode: Verbal Expression: 5-Expresses basic needs/ideas: With extra time/assistive device  Social Interaction Social Interaction: 6-Interacts appropriately with others with medication or extra time (anti-anxiety, antidepressant).  Problem Solving Problem Solving: 5-Solves basic problems: With no assist  Memory Memory: 5-Recognizes or recalls 90% of the time/requires cueing < 10% of the time  Medical Problem List and Plan  1. 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 DVT Prophylaxis/Anticoagulation: Xarelto. Monitor for  any bleeding episodes  3. Pain Management: Hydrocodone as needed. Monitor with increased mobility  4. Neuropsych: This patient is capable of making decisions on her own behalf.   -needs regular positive reinforcement 5. Sarcoidosis. Continue chronic prednisone. Patient with pronounced bilateral lower extremity weakness  6. Diabetes mellitus with peripheral neuropathy. uncontrolled Hemoglobin A1c 7.4. Amaryl increased to 8 mg daily. Checking blood sugars a.c. and at bedtime , metformin changed to 500mg  qd--increase to 1000mg  qam--(pm dose held).  Need to watch out for low am sugars.  Titrate further as indicated 7. Hypertension. Lopressor 25 mg twice a day. Monitor with increased mobility  8. Escherichia coli urinary tract infection.  po ceftin   9. Urinary retention continue Flomax. Voiding patterns overall improved, i/o cath prn.  -encourage OOB to void 10. Constipation: augmented bowel regimen with results.  LOS (Days) 11 A FACE TO FACE EVALUATION WAS PERFORMED  SWARTZ,ZACHARY T 07/11/2013, 9:13 AM

## 2013-07-11 NOTE — Plan of Care (Signed)
Problem: RH BLADDER ELIMINATION Goal: RH STG MANAGE BLADDER WITH ASSISTANCE STG Manage Bladder With mod Assistance  Outcome: Not Progressing Unable to tell when need to void-wears brief  Problem: RH PAIN MANAGEMENT Goal: RH STG PAIN MANAGED AT OR BELOW PT'S PAIN GOAL Pain level managed at or below 3 with min assistance  Outcome: Not Progressing Patient reports chronic pain to legs

## 2013-07-12 ENCOUNTER — Inpatient Hospital Stay (HOSPITAL_COMMUNITY): Payer: Medicare Other | Admitting: *Deleted

## 2013-07-12 LAB — GLUCOSE, CAPILLARY
Glucose-Capillary: 217 mg/dL — ABNORMAL HIGH (ref 70–99)
Glucose-Capillary: 142 mg/dL — ABNORMAL HIGH (ref 70–99)
Glucose-Capillary: 218 mg/dL — ABNORMAL HIGH (ref 70–99)
Glucose-Capillary: 205 mg/dL — ABNORMAL HIGH (ref 70–99)

## 2013-07-12 NOTE — Progress Notes (Signed)
Physical Therapy Session Note  Patient Details  Name: Elizabeth Drake MRN: [id number] Date of Birth: Oct 29, 1940  Today's Date: 07/12/2013 Time: 4098-1191 Time Calculation (min): 45 min   Skilled Therapeutic Interventions/Progress Updates:  At the beginning of the session patient in w/c ,asked to use BSC, transfer with max A. Assistance with donning and doffing lower body dressing ,mod A. Patient able to push up a little to pull pants on, standing in front of the sink for 25 seconds to assist in pulling up pants x2 . W/c mobility with supervision 4 x 70 feet wit rest breaks due to fatigue. Transfer to mat with mod A, dynamic and static sitting w/o support 15 min. Patient has difficulty correcting her posture, gets tired easily. Transfer back to w/c with mod A. Patient returned to room and transferred to bed with sliding board with min A and assistance with set up.Patient left in bed with all needs within reach. Patient stated she wants to go home and not into the SNF. No c/o of pain during this session. Therapy Documentation Precautions:  Precautions Precautions: Fall Restrictions Weight Bearing Restrictions: No Vital Signs: Therapy Vitals Pulse Rate: 102 Pain: Pain Assessment Pain Assessment: 0-10 Pain Score: 7  Pain Type: Acute pain Pain Location: Leg Pain Orientation: Right;Left Pain Descriptors / Indicators: Aching Pain Onset: On-going Pain Intervention(s): Medication (See eMAR)  See FIM for current functional status  Therapy/Group: Individual Therapy  Dorna Mai 07/12/2013, 9:47 AM

## 2013-07-12 NOTE — Plan of Care (Signed)
Problem: RH PAIN MANAGEMENT Goal: RH STG PAIN MANAGED AT OR BELOW PT'S PAIN GOAL Pain level managed at or below 3 with min assistance  Outcome: Not Progressing Patient has chronic pain to BLE

## 2013-07-12 NOTE — Progress Notes (Signed)
Subjective/Complaints:    had a reasonable night. Moved bowels yesterday. Denies pain. Still feels somewhat weak Review of Systems - otherwise negative  Objective: Vital Signs: Blood pressure 131/69, pulse 102, temperature 97.7 F (36.5 C), temperature source Oral, resp. rate 18, height 5\' 6"  (1.676 m), weight 51 kg (112 lb 7 oz), SpO2 96.00%. No results found. Results for orders placed during the hospital encounter of 06/30/13 (from the past 72 hour(s))  GLUCOSE, CAPILLARY     Status: Abnormal   Collection Time    07/09/13 11:48 AM      Result Value Range   Glucose-Capillary 228 (*) 70 - 99 mg/dL   Comment 1 Notify RN    GLUCOSE, CAPILLARY     Status: Abnormal   Collection Time    07/09/13  5:28 PM      Result Value Range   Glucose-Capillary 272 (*) 70 - 99 mg/dL   Comment 1 Notify RN    GLUCOSE, CAPILLARY     Status: Abnormal   Collection Time    07/09/13  8:36 PM      Result Value Range   Glucose-Capillary 223 (*) 70 - 99 mg/dL  GLUCOSE, CAPILLARY     Status: None   Collection Time    07/10/13  7:37 AM      Result Value Range   Glucose-Capillary 83  70 - 99 mg/dL  GLUCOSE, CAPILLARY     Status: Abnormal   Collection Time    07/10/13 11:58 AM      Result Value Range   Glucose-Capillary 208 (*) 70 - 99 mg/dL  GLUCOSE, CAPILLARY     Status: Abnormal   Collection Time    07/10/13  4:55 PM      Result Value Range   Glucose-Capillary 253 (*) 70 - 99 mg/dL  GLUCOSE, CAPILLARY     Status: Abnormal   Collection Time    07/10/13  8:41 PM      Result Value Range   Glucose-Capillary 194 (*) 70 - 99 mg/dL   Comment 1 Notify RN    GLUCOSE, CAPILLARY     Status: Abnormal   Collection Time    07/11/13  7:28 AM      Result Value Range   Glucose-Capillary 110 (*) 70 - 99 mg/dL  GLUCOSE, CAPILLARY     Status: Abnormal   Collection Time    07/11/13 11:56 AM      Result Value Range   Glucose-Capillary 196 (*) 70 - 99 mg/dL  GLUCOSE, CAPILLARY     Status: Abnormal   Collection Time    07/11/13  4:49 PM      Result Value Range   Glucose-Capillary 264 (*) 70 - 99 mg/dL  GLUCOSE, CAPILLARY     Status: Abnormal   Collection Time    07/11/13  8:41 PM      Result Value Range   Glucose-Capillary 217 (*) 70 - 99 mg/dL   Comment 1 Notify RN    GLUCOSE, CAPILLARY     Status: None   Collection Time    07/12/13  7:28 AM      Result Value Range   Glucose-Capillary 79  70 - 99 mg/dL   Comment 1 Notify RN       HEENT: normal Cardio: RRR and No murmur Resp: CTA B/L and Unlabored GI: BS positive and Nondistended Extremity:  Pulses positive and No Edema Skin:   Intact Neuro: Alert/Oriented, Cranial Nerve II-XII normal, Normal Sensory and Abnormal Motor 4+/5  bilateral deltoid, bicep, tricep, grip,   2   in the right hip flexor 3   knee extensor 2 + right ankle dorsiflexor plantar flexor  3 minus left hip flexor knee extensor ankle dorsiflexor plantar flexor Musc/Skel:  Other Atrophy in the right leg General no acute distress   Assessment/Plan: 1. Functional deficits secondary to  deconditioning after pulmonary embolism and DVT in a patient with chronic paraparesis  which require 3+ hours per day of interdisciplinary therapy in a comprehensive inpatient rehab setting. Physiatrist is providing close team supervision and 24 hour management of active medical problems listed below. Physiatrist and rehab team continue to assess barriers to discharge/monitor patient progress toward functional and medical goals.  Pt will need some assistance once home. SW looking the possibility of hired help at home.   FIM: FIM - Bathing Bathing Steps Patient Completed: Chest;Right Arm;Left Arm;Abdomen;Right upper leg;Left upper leg;Right lower leg (including foot);Left lower leg (including foot) Bathing: 4: Min-Patient completes 8-9 63f 10 parts or 75+ percent  FIM - Upper Body Dressing/Undressing Upper body dressing/undressing steps patient completed: Put head through opening  of pull over shirt/dress;Thread/unthread left sleeve of pullover shirt/dress;Thread/unthread right sleeve of pullover shirt/dresss Upper body dressing/undressing: 4: Min-Patient completed 75 plus % of tasks FIM - Lower Body Dressing/Undressing Lower body dressing/undressing steps patient completed: Don/Doff left sock;Don/Doff right shoe;Don/Doff left shoe;Fasten/unfasten right shoe;Fasten/unfasten left shoe;Thread/unthread right pants leg;Thread/unthread left pants leg Lower body dressing/undressing: 4: Min-Patient completed 75 plus % of tasks  FIM - Toileting Toileting: 0: Activity did not occur  FIM - Archivist Transfers: 0-Activity did not occur  FIM - Banker Devices: Arm rests Bed/Chair Transfer: 5: Set-up assist to: Apply orthosis/W/C set-up;3: Bed > Chair or W/C: Mod A (lift or lower assist);3: Chair or W/C > Bed: Mod A (lift or lower assist)  FIM - Locomotion: Wheelchair Distance: 100 Locomotion: Wheelchair: 2: Travels 50 - 149 ft with supervision, cueing or coaxing FIM - Locomotion: Ambulation Ambulation/Gait Assistance: Not tested (comment) Locomotion: Ambulation: 0: Activity did not occur  Comprehension Comprehension Mode: Auditory Comprehension: 5-Follows basic conversation/direction: With no assist  Expression Expression Mode: Verbal Expression: 5-Expresses basic needs/ideas: With extra time/assistive device  Social Interaction Social Interaction: 6-Interacts appropriately with others with medication or extra time (anti-anxiety, antidepressant).  Problem Solving Problem Solving: 5-Solves basic problems: With no assist  Memory Memory: 5-Recognizes or recalls 90% of the time/requires cueing < 10% of the time  Medical Problem List and Plan  1. 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 DVT Prophylaxis/Anticoagulation: Xarelto. Monitor for any  bleeding episodes  3. Pain Management: Hydrocodone as needed. Monitor with increased mobility  4. Neuropsych: This patient is capable of making decisions on her own behalf.   -needs regular positive reinforcement 5. Sarcoidosis. Continue chronic prednisone. Patient with pronounced bilateral lower extremity weakness  6. Diabetes mellitus with peripheral neuropathy. uncontrolled Hemoglobin A1c 7.4. Amaryl increased to 8 mg daily. Checking blood sugars a.c. and at bedtime , metformin changed to 500mg  qd--increase to 1000mg  qam--(pm dose held).  Need to watch out for low am sugars.  Showing signs of improvement 7. Hypertension. Lopressor 25 mg twice a day. Monitor with increased mobility  8. Escherichia coli urinary tract infection.  po ceftin   9. Urinary retention continue Flomax. Voiding patterns overall improved, i/o cath prn.  -encourage OOB to void 10. Constipation: moving bowels qod essentially LOS (Days) 12 A FACE TO FACE EVALUATION  WAS PERFORMED  Elizabeth Drake T 07/12/2013, 8:41 AM

## 2013-07-13 ENCOUNTER — Inpatient Hospital Stay (HOSPITAL_COMMUNITY): Payer: Medicare Other | Admitting: Physical Therapy

## 2013-07-13 ENCOUNTER — Inpatient Hospital Stay (HOSPITAL_COMMUNITY): Payer: Medicare Other | Admitting: *Deleted

## 2013-07-13 ENCOUNTER — Inpatient Hospital Stay (HOSPITAL_COMMUNITY): Payer: No Typology Code available for payment source | Admitting: Physical Therapy

## 2013-07-13 ENCOUNTER — Encounter (HOSPITAL_COMMUNITY): Payer: No Typology Code available for payment source

## 2013-07-13 ENCOUNTER — Inpatient Hospital Stay (HOSPITAL_COMMUNITY): Payer: Medicare Other | Admitting: Occupational Therapy

## 2013-07-13 DIAGNOSIS — R5381 Other malaise: Secondary | ICD-10-CM

## 2013-07-13 LAB — GLUCOSE, CAPILLARY
Glucose-Capillary: 75 mg/dL (ref 70–99)
Glucose-Capillary: 177 mg/dL — ABNORMAL HIGH (ref 70–99)
Glucose-Capillary: 174 mg/dL — ABNORMAL HIGH (ref 70–99)

## 2013-07-13 NOTE — Progress Notes (Signed)
Recreational Therapy Session Note  Patient Details  Name: Elizabeth Drake MRN: [id number] Date of Birth: 11-26-1940 Today's Date: 07/13/2013  Pain: c/o back pain, RN in to give meds Skilled Therapeutic Interventions/Progress Updates: Session focused on discharge planning, activity tolerance, w/c mobility, UE strength, functional transfers & problem solving.  Pt states she does not want to go to SNF unless no other option.   Therapy/Group: Co-Treatment   Yanet Balliet 07/13/2013, 9:08 AM

## 2013-07-13 NOTE — Progress Notes (Signed)
LBM 12/06. Complained of pain to BLE's, pain managed with PRN vicodin. 2 dark spots to 2nd toes on bilateral feet. Heels red and elevated off bed with pillows. Foam dressing to sacrum.  Incontinent of urine at HS. Unable to void this AM, bladder scan=357, I & O cath =400. Alfredo Martinez A

## 2013-07-13 NOTE — Progress Notes (Signed)
Inpatient Diabetes Program Recommendations  AACE/ADA: New Consensus Statement on Inpatient Glycemic Control (2013)  Target Ranges:  Prepandial:   less than 140 mg/dL      Peak postprandial:   less than 180 mg/dL (1-2 hours)      Critically ill patients:  140 - 180 mg/dL   Fasting am cbg running toward the low side most probably due to the HS correction. No correction ordered except for the HS scale. If patient could get a mild, sensitive correction tidwc or low dose meal coverage tidwc (when eats at least 50% of meal), this would prevent the HS CBG to get greater than 200, and then needing correction before sleep.  This appears to be the culprit of the fasting glucose somewhat on the low side.  Inpatient Diabetes Program Recommendations Correction (SSI): please add sensitive correction tidwc or meal coverage of 3 units tidwc. Oral Agents: xxx  Thank you, Lenor Coffin, RN, CNS, Diabetes Coordinator [phone])

## 2013-07-13 NOTE — Progress Notes (Signed)
Physical Therapy Session Note  Patient Details  Name: Elizabeth Drake MRN: [id number] Date of Birth: [birth year]  Today's Date: 07/13/2013 Time: 1430-1500 Time Calculation (min): 30 min  Short Term Goals: Week 2:  PT Short Term Goal 1 (Week 2): STGs=LTGs due to anticipated LOS  Skilled Therapeutic Interventions/Progress Updates:    Patient received supine in bed. Session focused on bed mobility with emphasis on rolling to B sides and sit<>supine with HOB flat. Patient initially supine>sit with HOB slightly elevated and use of bed rails with supervision, sit>supine with supervision. HOB lowered to flat and patient requires modA for supine>sit and minA sit>supine secondary to difficulty managing R LE. Patient rolling to B sides with supervision, but uses bedrails and only arms, maintaining LEs extended. Educated patient on proper sequencing by bringing B LEs into hooklying position, then initiating rolling as to decrease chance of torsional forces on low back. Introduced use of leg lifter for use with R LE only. Emphasized to patient use of leg lifter for days/times she is more fatigued, but that it is still important to continue to increase R LE strength in order to lift it without use of leg lifter. Patient performed several sit<>supine from flat bed and positioning of LEs in hooklying prior to rolling with use of leg lifter for R LE positioning. Patient requires minA for sit<>supine with use of bed rails and HOB flat. Patient left supine in bed with all needs within reach and bed alarm on.  Therapy Documentation Precautions:  Precautions Precautions: Fall Restrictions Weight Bearing Restrictions: No Pain: Pain Assessment Pain Assessment: No/denies pain Pain Score: 0-No pain Locomotion : Ambulation Ambulation/Gait Assistance: Not tested (comment)   See FIM for current functional status  Therapy/Group: Individual Therapy  Chipper Herb. Tarnesha Ulloa, PT, DPT 07/13/2013, 4:05 PM

## 2013-07-13 NOTE — Progress Notes (Signed)
Physical Therapy Note  Patient Details  Name: Elizabeth Drake MRN: [id number] Date of Birth: [birth year] Today's Date: 07/13/2013  1020 -1100 (40 minutes) individual Pain : no reported pain Focus of treatment: Transfer training; wc mobility for endurance; therapeutic exercise focused on strengthening bilateral LEs Treatment: Pt up in wc upon arrival; wc mobility 60 feet SBA on level surfaces; wc setup for transfer vcs for removal of armrest ; scoot (level) transfer min to close SBA; unlevel mat to wc mod lifting assist; sit to supine on mat SBA with UE assist for LEs; Therapeutic exercises X 15 - hip flexion using therapy ball in supine (AA on right); leg presses with manual resistance; hip abduction (AA on right) supine to sit SBA with pt self assisting RT LE off mat; returned to room with all needs within reach.        Chamia Schmutz,JIM 07/13/2013, 11:07 AM

## 2013-07-13 NOTE — Plan of Care (Signed)
Problem: RH BLADDER ELIMINATION Goal: RH STG MANAGE BLADDER WITH ASSISTANCE STG Manage Bladder With mod Assistance  Outcome: Not Progressing Requiring in and out caths due to inability to void.

## 2013-07-13 NOTE — Progress Notes (Signed)
Physical Therapy Note  Patient Details  Name: Elizabeth Drake MRN: [id number] Date of Birth: July 11, 1941 Today's Date: 07/13/2013  Time: 800-900 60 minutes  1:1 Pt c/o back pain. Transfers w/c <> mat with min A to level surface, pt with min cuing for w/c parts management and set up.  Standing tolerance/balance multiple attempts up to 3 minutes for horseshoe toss and balance reaching activity with min A for balance, min-mod A for sit to stand.  Scooting laterally edge of mat to assist with transfers, pt able to perform with cuing for UE/LE placement and close supervision.  Furniture transfers with rec therapy to simulate home environment. Pt required +2 assist to rise from low chair, able to perform with mod A from raised chair surface.  Discussed d/c planning and home set up with patient.  Pt states she does not want to go SNF and is willing to have a home eval to improve safety at home.  Cherise Fedder 07/13/2013, 9:04 AM

## 2013-07-13 NOTE — Progress Notes (Signed)
Subjective/Complaints:    no complaints. Up and ready for therapy! Review of Systems - otherwise negative  Objective: Vital Signs: Blood pressure 141/60, pulse 67, temperature 97.3 F (36.3 C), temperature source Oral, resp. rate 17, height 5\' 6"  (1.676 m), weight 51.9 kg (114 lb 6.7 oz), SpO2 99.00%. No results found. Results for orders placed during the hospital encounter of 06/30/13 (from the past 72 hour(s))  GLUCOSE, CAPILLARY     Status: Abnormal   Collection Time    07/10/13 11:58 AM      Result Value Range   Glucose-Capillary 208 (*) 70 - 99 mg/dL  GLUCOSE, CAPILLARY     Status: Abnormal   Collection Time    07/10/13  4:55 PM      Result Value Range   Glucose-Capillary 253 (*) 70 - 99 mg/dL  GLUCOSE, CAPILLARY     Status: Abnormal   Collection Time    07/10/13  8:41 PM      Result Value Range   Glucose-Capillary 194 (*) 70 - 99 mg/dL   Comment 1 Notify RN    GLUCOSE, CAPILLARY     Status: Abnormal   Collection Time    07/11/13  7:28 AM      Result Value Range   Glucose-Capillary 110 (*) 70 - 99 mg/dL  GLUCOSE, CAPILLARY     Status: Abnormal   Collection Time    07/11/13 11:56 AM      Result Value Range   Glucose-Capillary 196 (*) 70 - 99 mg/dL  GLUCOSE, CAPILLARY     Status: Abnormal   Collection Time    07/11/13  4:49 PM      Result Value Range   Glucose-Capillary 264 (*) 70 - 99 mg/dL  GLUCOSE, CAPILLARY     Status: Abnormal   Collection Time    07/11/13  8:41 PM      Result Value Range   Glucose-Capillary 217 (*) 70 - 99 mg/dL   Comment 1 Notify RN    GLUCOSE, CAPILLARY     Status: None   Collection Time    07/12/13  7:28 AM      Result Value Range   Glucose-Capillary 79  70 - 99 mg/dL   Comment 1 Notify RN    GLUCOSE, CAPILLARY     Status: Abnormal   Collection Time    07/12/13 11:30 AM      Result Value Range   Glucose-Capillary 142 (*) 70 - 99 mg/dL   Comment 1 Notify RN    GLUCOSE, CAPILLARY     Status: Abnormal   Collection Time   07/12/13  4:25 PM      Result Value Range   Glucose-Capillary 218 (*) 70 - 99 mg/dL   Comment 1 Notify RN    GLUCOSE, CAPILLARY     Status: Abnormal   Collection Time    07/12/13  8:38 PM      Result Value Range   Glucose-Capillary 205 (*) 70 - 99 mg/dL   Comment 1 Notify RN    GLUCOSE, CAPILLARY     Status: None   Collection Time    07/13/13  7:40 AM      Result Value Range   Glucose-Capillary 75  70 - 99 mg/dL   Comment 1 Notify RN       HEENT: normal Cardio: RRR and No murmur Resp: CTA B/L and Unlabored GI: BS positive and Nondistended Extremity:  Pulses positive and No Edema Skin:   Intact Neuro: Alert/Oriented, Cranial  Nerve II-XII normal, Normal Sensory and Abnormal Motor 4+/5 bilateral deltoid, bicep, tricep, grip.   2+  in the right hip flexor 3-   knee extensor 2 + right ankle dorsiflexor plantar flexor  3  left hip flexor knee extensor ankle dorsiflexor plantar flexor Musc/Skel:  Other Atrophy in the right leg General no acute distress   Assessment/Plan: 1. Functional deficits secondary to  deconditioning after pulmonary embolism and DVT in a patient with chronic paraparesis  which require 3+ hours per day of interdisciplinary therapy in a comprehensive inpatient rehab setting. Physiatrist is providing close team supervision and 24 hour management of active medical problems listed below. Physiatrist and rehab team continue to assess barriers to discharge/monitor patient progress toward functional and medical goals.      FIM: FIM - Bathing Bathing Steps Patient Completed: Chest;Right Arm;Left Arm;Abdomen;Right upper leg;Left upper leg;Right lower leg (including foot);Left lower leg (including foot) Bathing: 4: Min-Patient completes 8-9 50f 10 parts or 75+ percent  FIM - Upper Body Dressing/Undressing Upper body dressing/undressing steps patient completed: Put head through opening of pull over shirt/dress;Thread/unthread left sleeve of pullover  shirt/dress;Thread/unthread right sleeve of pullover shirt/dresss Upper body dressing/undressing: 4: Min-Patient completed 75 plus % of tasks FIM - Lower Body Dressing/Undressing Lower body dressing/undressing steps patient completed: Don/Doff left sock;Don/Doff right shoe;Don/Doff left shoe;Fasten/unfasten right shoe;Fasten/unfasten left shoe;Thread/unthread right pants leg;Thread/unthread left pants leg Lower body dressing/undressing: 4: Min-Patient completed 75 plus % of tasks  FIM - Toileting Toileting: 0: Activity did not occur  FIM - Archivist Transfers: 0-Activity did not occur  FIM - Banker Devices: Arm rests Bed/Chair Transfer: 3: Bed > Chair or W/C: Mod A (lift or lower assist);3: Chair or W/C > Bed: Mod A (lift or lower assist)  FIM - Locomotion: Wheelchair Distance: 100 Locomotion: Wheelchair: 2: Travels 50 - 149 ft with supervision, cueing or coaxing FIM - Locomotion: Ambulation Ambulation/Gait Assistance: Not tested (comment) Locomotion: Ambulation: 0: Activity did not occur  Comprehension Comprehension Mode: Auditory Comprehension: 5-Follows basic conversation/direction: With extra time/assistive device  Expression Expression Mode: Verbal Expression: 5-Expresses basic needs/ideas: With extra time/assistive device  Social Interaction Social Interaction: 6-Interacts appropriately with others with medication or extra time (anti-anxiety, antidepressant).  Problem Solving Problem Solving: 5-Solves basic problems: With no assist  Memory Memory: 5-Recognizes or recalls 90% of the time/requires cueing < 10% of the time  Medical Problem List and Plan  1. 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 DVT Prophylaxis/Anticoagulation: Xarelto. Monitor for any bleeding episodes  3. Pain Management: Hydrocodone as needed. Monitor with increased mobility   4. Neuropsych: This patient is capable of making decisions on her own behalf.   -needs regular positive reinforcement 5. Sarcoidosis. Continue chronic prednisone. Patient with pronounced bilateral lower extremity weakness  6. Diabetes mellitus with peripheral neuropathy. uncontrolled Hemoglobin A1c 7.4. Amaryl increased to 8 mg daily. Checking blood sugars a.c. and at bedtime , metformin changed to 500mg  qd--increased to 1000mg  qam--(pm dose held).  Need to watch out for low am sugars.  Showing some improvement 7. Hypertension. Lopressor 25 mg twice a day. Monitor with increased mobility  8. Escherichia coli urinary tract infection.  po ceftin   9. Urinary retention continue Flomax. Voiding patterns overall improved, i/o cath prn.  -encourage OOB to void 10. Constipation: moving bowels qod essentially LOS (Days) 13 A FACE TO FACE EVALUATION WAS PERFORMED  Moe Graca T 07/13/2013, 8:26 AM

## 2013-07-13 NOTE — Progress Notes (Signed)
Occupational Therapy Session Note  Patient Details  Name: Elizabeth Drake MRN: [id number] Date of Birth: Jun 07, 1941  Today's Date: 07/13/2013 Time: 4270-6237 Time Calculation (min): 55 min  Short Term Goals: Week 1:  OT Short Term Goal 1 (Week 1): Bath:  Mod assist with bath to include lateral leans for peri area and buttocks OT Short Term Goal 2 (Week 1): LB Dressing:  Mod assist to include lateral leans OT Short Term Goal 3 (Week 1): Toilet Transfer: Mod assist with squat pivot OT Short Term Goal 4 (Week 1): Toileting:  Mod assist with 2/3 tasks to include lateral leans  Skilled Therapeutic Interventions/Progress Updates:  Patient found seated in w/c at sink. Patient requested to bathe at shower level. Patient propelled self into bathroom and transferred onto tub transfer bench with minimal assistance. Patient then completed UB/LB bathing in seated position with occasional steady assist. After bathing, patient transferred back to w/c with minimal assistance. UB/LB dressing completed from w/c level, patient completed sit<>stand at sink in order to donn brief and pull pants up to waist. Patient takes more than reasonable amount of time and requires multiple rest breaks during therapy. Patient sat at sink to complete grooming tasks at a mod I level. At end of session, patient left with in room with PT present.   Precautions:  Precautions Precautions: Fall Restrictions Weight Bearing Restrictions: No  See FIM for current functional status  Therapy/Group: Individual Therapy  Brittanee Ghazarian 07/13/2013, 10:35 AM

## 2013-07-14 ENCOUNTER — Encounter (HOSPITAL_COMMUNITY): Payer: No Typology Code available for payment source | Admitting: *Deleted

## 2013-07-14 ENCOUNTER — Encounter (HOSPITAL_COMMUNITY): Payer: No Typology Code available for payment source | Admitting: Occupational Therapy

## 2013-07-14 ENCOUNTER — Inpatient Hospital Stay (HOSPITAL_COMMUNITY): Payer: Medicare Other

## 2013-07-14 DIAGNOSIS — G8220 Paraplegia, unspecified: Secondary | ICD-10-CM

## 2013-07-14 DIAGNOSIS — R5381 Other malaise: Secondary | ICD-10-CM

## 2013-07-14 DIAGNOSIS — D869 Sarcoidosis, unspecified: Secondary | ICD-10-CM

## 2013-07-14 LAB — URINE MICROSCOPIC-ADD ON

## 2013-07-14 LAB — URINALYSIS, ROUTINE W REFLEX MICROSCOPIC
Bilirubin Urine: NEGATIVE
Glucose, UA: NEGATIVE mg/dL
Ketones, ur: NEGATIVE mg/dL
Nitrite: POSITIVE — AB
Specific Gravity, Urine: 1.019 (ref 1.005–1.030)
Urobilinogen, UA: 0.2 mg/dL (ref 0.0–1.0)
pH: 5.5 (ref 5.0–8.0)

## 2013-07-14 LAB — GLUCOSE, CAPILLARY: Glucose-Capillary: 104 mg/dL — ABNORMAL HIGH (ref 70–99)

## 2013-07-14 MED ORDER — ENSURE PUDDING PO PUDG
1.0000 | Freq: Two times a day (BID) | ORAL | Status: DC
  Administered 2013-07-15 – 2013-07-17 (×4): 1 via ORAL

## 2013-07-14 MED ORDER — BETHANECHOL CHLORIDE 10 MG PO TABS
10.0000 mg | ORAL_TABLET | Freq: Three times a day (TID) | ORAL | Status: DC
  Administered 2013-07-14 – 2013-07-17 (×9): 10 mg via ORAL
  Filled 2013-07-14 (×12): qty 1

## 2013-07-14 MED ORDER — LOPERAMIDE HCL 2 MG PO CAPS
2.0000 mg | ORAL_CAPSULE | ORAL | Status: DC | PRN
  Administered 2013-07-15: 2 mg via ORAL
  Filled 2013-07-14: qty 1

## 2013-07-14 NOTE — Progress Notes (Signed)
NUTRITION FOLLOW-UP  DOCUMENTATION CODES Per approved criteria  -Not Applicable   INTERVENTION: Add Ensure Pudding po BID, each supplement provides 170 kcal and 4 grams of protein.  RD to continue to follow nutrition care plan.  NUTRITION DIAGNOSIS: Inadequate oral intake related to variable appetite as evidenced by pt report. Ongoing.  Goal: Intake to meet >90% of estimated nutrition needs.  Monitor:  weight trends, lab trends, I/O's, PO intake, supplement tolerance  ASSESSMENT: PMHx significant for sarcoidosis on chronic prednisone and severe peripheral neuropathy with diabetes mellitus. Admitted 11/19 with pulmonary embolus and acute DVT of LLE. Also with E. Coli UTI.  Plan for d/c on 12/11. Pt is eating 50-100% of meals. Pt reports that she is eating fair, she notes that it is not a realistic expectation for her to eat 100% of all meals. Pt is concerned with her ongoing weight loss and is agreeable to trying Ensure Pudding but she is "pretty sure" that she "won't like it."  Height: Ht Readings from Last 1 Encounters:  07/04/13 5\' 6"  (1.676 m)    Weight: Wt Readings from Last 1 Encounters:  07/14/13 113 lb 5.1 oz (51.4 kg)  Admit wt is 119 lb - stable  BMI:  Body mass index is 18.3 kg/(m^2). Normal weight  Estimated Nutritional Needs: Kcal: 1500 - 1800 Protein: 60 - 70 g Fluid: 1.5 - 1.8 liters  Skin:  Stage I pressure ulcer to sacrum Diabetic toe ulcer  Diet Order: Carb Control Medium  EDUCATION NEEDS: -No education needs identified at this time   Intake/Output Summary (Last 24 hours) at 07/14/13 1146 Last data filed at 07/14/13 0223  Gross per 24 hour  Intake    240 ml  Output    750 ml  Net   -510 ml    Last BM: 12/6  Labs:  No results found for this basename: NA, K, CL, CO2, BUN, CREATININE, CALCIUM, MG, PHOS, GLUCOSE,  in the last 168 hours  CBG (last 3)   Recent Labs  07/13/13 1628 07/13/13 2048 07/14/13 0752  GLUCAP 174* 125* 104*     Scheduled Meds: . calcium-vitamin D  1 tablet Oral Q breakfast  . glimepiride  8 mg Oral Q breakfast  . insulin aspart  0-5 Units Subcutaneous QHS  . metFORMIN  1,000 mg Oral Q breakfast  . metoprolol tartrate  25 mg Oral BID  . multivitamin with minerals  1 tablet Oral Daily  . predniSONE  10 mg Oral Q breakfast  . Rivaroxaban  15 mg Oral BID WC   Followed by  . [START ON 07/18/2013] rivaroxaban  20 mg Oral Q supper  . senna-docusate  2 tablet Oral BID  . tamsulosin  0.4 mg Oral Daily    Continuous Infusions:    Jarold Motto MS, RD, LDN Pager: [phone] After-hours pager: [phone]

## 2013-07-14 NOTE — Progress Notes (Signed)
Subjective/Complaints:    low grade temp. No other problems reported. Up on [facility] already this am Review of Systems - otherwise negative  Objective: Vital Signs: Blood pressure 110/67, pulse 73, temperature 98.1 F (36.7 C), temperature source Oral, resp. rate 18, height 5\' 6"  (1.676 m), weight 51.4 kg (113 lb 5.1 oz), SpO2 93.00%. No results found. Results for orders placed during the hospital encounter of 06/30/13 (from the past 72 hour(s))  GLUCOSE, CAPILLARY     Status: Abnormal   Collection Time    07/11/13 11:56 AM      Result Value Range   Glucose-Capillary 196 (*) 70 - 99 mg/dL  GLUCOSE, CAPILLARY     Status: Abnormal   Collection Time    07/11/13  4:49 PM      Result Value Range   Glucose-Capillary 264 (*) 70 - 99 mg/dL  GLUCOSE, CAPILLARY     Status: Abnormal   Collection Time    07/11/13  8:41 PM      Result Value Range   Glucose-Capillary 217 (*) 70 - 99 mg/dL   Comment 1 Notify RN    GLUCOSE, CAPILLARY     Status: None   Collection Time    07/12/13  7:28 AM      Result Value Range   Glucose-Capillary 79  70 - 99 mg/dL   Comment 1 Notify RN    GLUCOSE, CAPILLARY     Status: Abnormal   Collection Time    07/12/13 11:30 AM      Result Value Range   Glucose-Capillary 142 (*) 70 - 99 mg/dL   Comment 1 Notify RN    GLUCOSE, CAPILLARY     Status: Abnormal   Collection Time    07/12/13  4:25 PM      Result Value Range   Glucose-Capillary 218 (*) 70 - 99 mg/dL   Comment 1 Notify RN    GLUCOSE, CAPILLARY     Status: Abnormal   Collection Time    07/12/13  8:38 PM      Result Value Range   Glucose-Capillary 205 (*) 70 - 99 mg/dL   Comment 1 Notify RN    GLUCOSE, CAPILLARY     Status: None   Collection Time    07/13/13  7:40 AM      Result Value Range   Glucose-Capillary 75  70 - 99 mg/dL   Comment 1 Notify RN    GLUCOSE, CAPILLARY     Status: Abnormal   Collection Time    07/13/13 11:33 AM      Result Value Range   Glucose-Capillary 177 (*) 70 - 99 mg/dL    Comment 1 Notify RN    GLUCOSE, CAPILLARY     Status: Abnormal   Collection Time    07/13/13  4:28 PM      Result Value Range   Glucose-Capillary 174 (*) 70 - 99 mg/dL   Comment 1 Notify RN    GLUCOSE, CAPILLARY     Status: Abnormal   Collection Time    07/13/13  8:48 PM      Result Value Range   Glucose-Capillary 125 (*) 70 - 99 mg/dL   Comment 1 Notify RN    GLUCOSE, CAPILLARY     Status: Abnormal   Collection Time    07/14/13  7:52 AM      Result Value Range   Glucose-Capillary 104 (*) 70 - 99 mg/dL   Comment 1 Notify RN  HEENT: normal Cardio: RRR and No murmur Resp: CTA B/L and Unlabored GI: BS positive and Nondistended Extremity:  Pulses positive and No Edema Skin:   Intact Neuro: Alert/Oriented, Cranial Nerve II-XII normal, Normal Sensory and Abnormal Motor 4+/5 bilateral deltoid, bicep, tricep, grip.   2+  in the right hip flexor 3-   knee extensor 2 + right ankle dorsiflexor plantar flexor  3  left hip flexor knee extensor ankle dorsiflexor plantar flexor Musc/Skel:  Other Atrophy in the right leg General no acute distress   Assessment/Plan: 1. Functional deficits secondary to  deconditioning after pulmonary embolism and DVT in a patient with chronic paraparesis  which require 3+ hours per day of interdisciplinary therapy in a comprehensive inpatient rehab setting. Physiatrist is providing close team supervision and 24 hour management of active medical problems listed below. Physiatrist and rehab team continue to assess barriers to discharge/monitor patient progress toward functional and medical goals.      FIM: FIM - Bathing Bathing Steps Patient Completed: Chest;Right Arm;Left Arm;Abdomen;Right upper leg;Left upper leg;Right lower leg (including foot);Left lower leg (including foot);Front perineal area Bathing: 4: Min-Patient completes 8-9 59f 10 parts or 75+ percent  FIM - Upper Body Dressing/Undressing Upper body dressing/undressing steps patient  completed: Put head through opening of pull over shirt/dress;Thread/unthread left sleeve of pullover shirt/dress;Thread/unthread right sleeve of pullover shirt/dresss;Thread/unthread right bra strap;Thread/unthread left bra strap;Pull shirt over trunk Upper body dressing/undressing: 4: Min-Patient completed 75 plus % of tasks FIM - Lower Body Dressing/Undressing Lower body dressing/undressing steps patient completed: Don/Doff left sock;Don/Doff right shoe;Don/Doff left shoe;Fasten/unfasten right shoe;Fasten/unfasten left shoe;Thread/unthread right pants leg;Thread/unthread left pants leg;Don/Doff right sock Lower body dressing/undressing: 4: Min-Patient completed 75 plus % of tasks  FIM - Toileting Toileting: 0: Activity did not occur  FIM - Archivist Transfers: 0-Activity did not occur  FIM - Banker Devices: Arm rests Bed/Chair Transfer: 4: Supine > Sit: Min A (steadying Pt. > 75%/lift 1 leg);5: Sit > Supine: Supervision (verbal cues/safety issues)  FIM - Locomotion: Wheelchair Distance: 100 Locomotion: Wheelchair: 0: Activity did not occur FIM - Locomotion: Ambulation Ambulation/Gait Assistance: Not tested (comment) Locomotion: Ambulation: 0: Activity did not occur  Comprehension Comprehension Mode: Auditory Comprehension: 5-Follows basic conversation/direction: With no assist  Expression Expression Mode: Verbal Expression: 5-Expresses basic needs/ideas: With extra time/assistive device  Social Interaction Social Interaction: 6-Interacts appropriately with others with medication or extra time (anti-anxiety, antidepressant).  Problem Solving Problem Solving: 5-Solves basic problems: With no assist  Memory Memory: 5-Recognizes or recalls 90% of the time/requires cueing < 10% of the time  Medical Problem List and Plan  1. Deconditioned secondary to DVT/pulmonary emboli superimposed on paraparesis from previous sarcoid  myelopathy  2. DVT Prophylaxis/Anticoagulation: Xarelto. Monitor for any bleeding episodes  3. Pain Management: Hydrocodone as needed. Monitor with increased mobility  4. Neuropsych: This patient is capable of making decisions on her own behalf.   -needs regular positive reinforcement 5. Sarcoidosis. Continue chronic prednisone. Patient with pronounced bilateral lower extremity weakness  6. Diabetes mellitus with peripheral neuropathy. uncontrolled Hemoglobin A1c 7.4. Amaryl increased to 8 mg daily. Checking blood sugars a.c. and at bedtime , metformin changed to 500mg  qd--increased to 1000mg  qam--(pm dose held).  Need to watch out for low am sugars.  Showing some improvement 7. Hypertension. Lopressor 25 mg twice a day. Monitor with increased mobility  8. Escherichia coli urinary tract infection.  po ceftin completed  9. Urinary retention continue Flomax. Voiding patterns overall improved, i/o  cath prn.  -encourage OOB to void  -low grade temp=== recheck check ua /cx 10. Constipation: moving bowels qod essentially LOS (Days) 14 A FACE TO FACE EVALUATION WAS PERFORMED  Kynzlee Hucker T 07/14/2013, 8:14 AM

## 2013-07-14 NOTE — Progress Notes (Signed)
Recreational Therapy Session Note  Patient Details  Name: Elizabeth Drake MRN: [id number] Date of Birth: Apr 22, 1941 Today's Date: 07/14/2013  Pain: no c/o, pt premedicated, requesting pain med once returned to rehab center Skilled Therapeutic Interventions/Progress Updates: Complete home eval with Elijah Birk, COTA.  See OT note for further details   Krishawna Stiefel 07/14/2013, 4:32 PM

## 2013-07-14 NOTE — Progress Notes (Signed)
Physical Therapy Session Note  Patient Details  Name: Elizabeth Drake MRN: [id number] Date of Birth: 07/19/1941  Today's Date: 07/14/2013 Time: 0900-1005 Time Calculation (min): 65 min  Short Term Goals: Week 2:  PT Short Term Goal 1 (Week 2): STGs=LTGs due to anticipated LOS  Skilled Therapeutic Interventions/Progress Updates:  1:1. Pt received sitting in w/c, ready for therapy. Focus majority of this session on w/c evaluation w/ Fleet Contras from [facility]. Pt verbalized need to use bathroom at end of session. Mod A for squat pivot w/c<>BSC and total A x2 for managing clothing and pericare. Pt supine in bed at end of session w/ mod A in care of nurse tech.   Therapy Documentation Precautions:  Precautions Precautions: Fall Restrictions Weight Bearing Restrictions: No  See FIM for current functional status  Therapy/Group: Individual Therapy  Denzil Hughes 07/14/2013, 12:13 PM

## 2013-07-14 NOTE — Progress Notes (Signed)
Occupational Therapy Session Note  Patient Details  Name: [name] MRN: [id number] Date of Birth: [birth year]  Today's Date: 07/14/2013 Time: 0830-0900 Time Calculation (min): 30 min  Short Term Goals: Week 1:  OT Short Term Goal 1 (Week 1): Bath:  Mod assist with bath to include lateral leans for peri area and buttocks OT Short Term Goal 2 (Week 1): LB Dressing:  Mod assist to include lateral leans OT Short Term Goal 3 (Week 1): Toilet Transfer: Mod assist with squat pivot OT Short Term Goal 4 (Week 1): Toileting:  Mod assist with 2/3 tasks to include lateral leans  Skilled Therapeutic Interventions/Progress Updates:    1:1 self care retraining with focus on dressing only with focus on sit to stands, standing balance and tolerance, standing with only one UE support for clothing management and d/c planning and making plans for home eval today.   Therapy Documentation Precautions:  Precautions Precautions: Fall Restrictions Weight Bearing Restrictions: No Pain:  no c/o in session   See FIM for current functional status  Therapy/Group: Individual Therapy  [name] [facility] 07/14/2013, 12:19 PM

## 2013-07-14 NOTE — Progress Notes (Signed)
Occupational Therapy Note  Patient Details  Name: Elizabeth Drake MRN: [id number] Date of Birth: [birth year] Today's Date: 07/14/2013  Time: 9147-8295 Pt denies Pain Individual Therapy (cotx with Recreational Therapist)  Home evaluation with Recreational Therapist.  Pt's husband present at home to practice w/c<>bed transfers, bed<>drop arm BSC transfers, w/c<>chair transfers.  Pt's husband assisted with transfers but will require additional education during therapy.  Pt's bedroom, kitchen, dining, and living area is w/c accessible but bathroom is not accessible.  Recommended drop arm BSC for use beside bed at home.  See home eval in shadow chart.   Lavone Neri [facility] 07/14/2013, 4:01 PM

## 2013-07-15 ENCOUNTER — Inpatient Hospital Stay (HOSPITAL_COMMUNITY): Payer: Medicare Other

## 2013-07-15 ENCOUNTER — Inpatient Hospital Stay (HOSPITAL_COMMUNITY): Payer: No Typology Code available for payment source | Admitting: Rehabilitation

## 2013-07-15 ENCOUNTER — Encounter (HOSPITAL_COMMUNITY): Payer: No Typology Code available for payment source | Admitting: Occupational Therapy

## 2013-07-15 DIAGNOSIS — G8220 Paraplegia, unspecified: Secondary | ICD-10-CM

## 2013-07-15 DIAGNOSIS — R5381 Other malaise: Secondary | ICD-10-CM

## 2013-07-15 DIAGNOSIS — D869 Sarcoidosis, unspecified: Secondary | ICD-10-CM

## 2013-07-15 LAB — GLUCOSE, CAPILLARY
Glucose-Capillary: 78 mg/dL (ref 70–99)
Glucose-Capillary: 141 mg/dL — ABNORMAL HIGH (ref 70–99)
Glucose-Capillary: 193 mg/dL — ABNORMAL HIGH (ref 70–99)

## 2013-07-15 NOTE — Patient Care Conference (Signed)
Inpatient RehabilitationTeam Conference and Plan of Care Update Date: 07/14/2013   Time: 3:05 PM    Patient Name: Elizabeth Drake      Medical Record Number: [id number]  Date of Birth: [birth year] Sex: Female         Room/Bed: 4W23C/4W23C-01 Payor Info: Payor: MEDICARE / Plan: MEDICARE PART A AND B / Product Type: *No Product type* /    Admitting Diagnosis: Sarcold myelopathy  Admit Date/Time:  06/30/2013  7:28 PM Admission Comments: No comment available   Primary Diagnosis:  <principal problem not specified> Principal Problem: <principal problem not specified>  Patient Active Problem List   Diagnosis Date Noted  . Physical deconditioning 06/30/2013  . DKA, type 2 06/25/2013  . Pulmonary embolism 06/24/2013  . PE (pulmonary thromboembolism) 06/24/2013  . Hypoxemia 06/24/2013  . Tachycardia 06/24/2013  . Cellulitis of foot without toes, right 06/24/2013  . DM type 2 (diabetes mellitus, type 2) 04/25/2013  . OAB (overactive bladder) 04/25/2013  . Cellulitis of toe of left foot with ulcer 04/25/2013  . Leukocytosis, unspecified 04/25/2013  . 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 Wheelchair bound 04/25/2013  . UTI (lower urinary tract infection) 04/25/2013  . Osteoporosis, unspecified 04/25/2013  . Chronic back pain 04/25/2013  . Chronic leg pain 04/25/2013  . OA (osteoarthritis) 04/25/2013  . Hyperlipidemia 12/31/2012  . Sarcoidosis 12/31/2012    Expected Discharge Date: Expected Discharge Date: 07/16/13  Team Members Present: Physician leading conference: Dr. Faith Rogue Social Worker Present: Amada Jupiter, LCSW Nurse Present: Carlean Purl, RN PT Present: Cyndia Skeeters, Scot Jun, PT OT Present: [name], Loistine Chance, OT SLP Present: Feliberto Gottron, SLP PPS Coordinator present : Tora Duck, RN, CRRN;Becky Henrene Dodge, PT     Current Status/Progress Goal Weekly Team Focus  Medical   urine retention, see prior.   see prior  see prior. bladder ed    Bowel/Bladder   incont. and cont. of bladder&bowel.Last bm 07/11/13.Requires I&O caths for retention.  bowel and bladder managed by pt/family by d/c  Up to the [facility] to void    Swallow/Nutrition/ Hydration             ADL's   min  A overall iwth more than reasonable amt of time  overall min  A  family education, home eval   Mobility   Min-Mod A for mobility, supervsion vor w/c propulsion <75', continues to demostrate decreased functional endurance and postural control  Mod(I) for w/c propulsion <100' in controlled/home environments. (S) for dynamic sitting balance; Min A for bed mob, transfers  W/C evaluation, family training, functional endurance, functional transfers   Communication             Safety/Cognition/ Behavioral Observations  No unsafe behaviors noted.  No injury,follows safety plan  Toilet regularly,remind pt to call for assist   Pain   C/o pain in both lower extrem.Vicodin prn  Pain relief,goal 3 or less  Monitor effectiveness of pain med   Skin   1.sacrum pink,2.small black areas to right2nd toe and left 2nd toe  healing w/o further skin issues  Monitor skin and assist pt to turn at night    Rehab Goals Patient on target to meet rehab goals: Yes *See Care Plan and progress notes for long and short-term goals.  Barriers to Discharge: see prior    Possible Resolutions to Barriers:  see prior    Discharge Planning/Teaching Needs:  home with husband to provide 24/7 assistance      Team Discussion:  Currently on home  eval.  Hoping to do some education with husband on this trip.  Concern still over voiding issues.  Pt still plans on d/c home.  May need to change d/c date to complete family ed.  Revisions to Treatment Plan:  None at this time.   Continued Need for Acute Rehabilitation Level of Care: The patient requires daily medical management by a physician with specialized training in physical medicine and rehabilitation for the following conditions: Daily  direction of a multidisciplinary physical rehabilitation program to ensure safe treatment while eliciting the highest outcome that is of practical value to the patient.: Yes Daily medical management of patient stability for increased activity during participation in an intensive rehabilitation regime.: Yes Daily analysis of laboratory values and/or radiology reports with any subsequent need for medication adjustment of medical intervention for : Other;Neurological problems  Elizabeth Drake 07/15/2013, 1:51 PM

## 2013-07-15 NOTE — Progress Notes (Signed)
Occupational Therapy Daily and Weekly Progress Note  Patient Details  Name: Elizabeth Drake MRN: [id number] Date of Birth: April 10, 1941  Today's Date: 07/15/2013 Time: 1000-1100 Time Calculation (min): 60 min  1:1 continue to focus on sit to stands, standing balance, standing tolerance and activity tolerance with basic ADL tasks. Pt requested to shower today. Pt will not be able to access her shower at home due to not performing ambulation at this time. Continued to discussed d/c planning and results of home evaluation yesterday.   Weekly  Patient has met 3 of 3 short term goals.  Pt has made good gains in occupational therapy since time of evaluation. Pt is overall min A for basic scoot pivot transfers, sit to stands, standing balance for 20-30 seconds at a time, bathing and dressing. At this time pt can not access her bathroom at home so she will be performing toileting and toilet transfers with a BSC and performing BADLs at kitchen sink. A home evaulation was performed yesterday with her husband present to determine best options for success at their home dwelling. Pt reports pt's sister will also be providing some assistance prn at home. Pt continues to fatigue easily with prolonged unsupported sitting activities and standing. Pt is working with physical therapy for a special sitting system to better fit her needs.   Patient continues to demonstrate the following deficits: activity tolerance, standing balance, sitting and standing tolerance, hyperextension of right knee in standing, urine retention,  Bowel accidents  and therefore will continue to benefit from skilled OT intervention to enhance overall performance with BADL, Reduce care partner burden and family education.  Patient progressing toward long term goals..  Continue plan of care.  OT Short Term Goals Week 1:  OT Short Term Goal 1 (Week 1): Bath:  Mod assist with bath to include lateral leans for peri area and buttocks OT Short Term  Goal 1 - Progress (Week 1): Met OT Short Term Goal 2 (Week 1): LB Dressing:  Mod assist to include lateral leans OT Short Term Goal 2 - Progress (Week 1): Met OT Short Term Goal 3 (Week 1): Toilet Transfer: Mod assist with squat pivot OT Short Term Goal 3 - Progress (Week 1): Met OT Short Term Goal 4 (Week 1): Toileting:  Mod assist with 2/3 tasks to include lateral leans OT Short Term Goal 4 - Progress (Week 1): Met Week 2:  OT Short Term Goal 1 (Week 2): complete family education   Skilled Therapeutic Interventions/Progress Updates:      Therapy Documentation Precautions:  Precautions Precautions: Fall Restrictions Weight Bearing Restrictions: No Pain: No c/o pain in session just fatigue See FIM for current functional status  Therapy/Group: Individual Therapy  [name] [facility] 07/15/2013, 10:43 AM

## 2013-07-15 NOTE — Consult Note (Signed)
INITIAL DIAGNOSTIC EVALUATION - [facility]IDENTIAL [place] [facility]   MEDICAL NECESSITY:  [name] was seen on the [facility] Inpatient Rehabilitation Unit for an initial diagnostic evaluation owing to the patient's diagnosis of physical deconditioning.   According to medical records, [name] was admitted to the rehab unit owing to "functional deficits secondary to deconditioning after pulmonary embolism and DVT in a patient with chronic paraparesis."  During today's visit, [name] denied experiencing any cognitive difficulties. She described her current mood as "okay" but "not that good," admitting to transient feeling of depression given her present medical situation. She has no reported history of mental health issues. At first she did not feel that she was making strides in therapy but she is now noticing mild gains that are slow to improve. She was adamant about wanting to return home, particularly since Christmas is close.   [name] described having a good social support system that includes her family and husband. She reported nothing but good things about her treatment team. He also mentioned that her adult son lives with them and is willing to assist with daily activities as needed.   PROCEDURES ADMINISTERED: [1 unit [id number] on 07/13/13] Diagnostic clinical interview  Review of available records  Emotional & Behavioral Evaluation: [name] was appropriately dressed for season and situation, and she appeared tidy and well-groomed. Normal posture was noted. She was friendly and rapport was easily established. Her speech was as expected and she was able to express ideas effectively. Her affect was somewhat bland. Attention and motivation were good.   From an emotional standpoint, [name] admitted to mild depressive symptoms owing to her present medical situation. No adjustment issues related to her hospital admission were endorsed. Suicidal/homicidal  ideation, plan or intent was denied. No manic or hypomanic episodes were reported. The patient denied ever experiencing any auditory/visual hallucinations. No major behavioral or personality changes were endorsed.    Overall, [name] denied experiencing any major cognitive issues and no blatant signs of cognitive impairment were observed. She admitted to mild depressive symptoms in reaction to her present medical situation but does not wish to initiate any medical treatment for mood. And no treatment appears warranted beyond supportive psychotherapy as needed. [name] said she would initiate contact with neuropsychology if she wished to pursue this endeavor. As such, we will follow-up as requested.    Debbe Mounts, Psy.D.  Clinical Neuropsychologist

## 2013-07-15 NOTE — Progress Notes (Signed)
Occupational Therapy Note  Patient Details  Name: PATRA GHERARDI MRN: [id number] Date of Birth: 11-28-1940 Today's Date: 07/15/2013  Time: 1135-1200 Pt denies pain Individual Therapy  Pt engaged is sit<>stand from w/c with high back chair in front to position BUE once standing.  Pt performed sit<>stand X 5 with close supervision and used BUE to support self while standing.  Pt only able to stand for approx 10 secs each occurrence. Pt transferred to bed at end of session requiring min A for squat pivot transfer.   Lavone Neri [facility] 07/15/2013, 12:03 PM

## 2013-07-15 NOTE — Progress Notes (Signed)
Subjective/Complaints:   Slept well. Not voiding. Denies pain. Slow progress. Home eval yesterday Review of Systems - otherwise negative  Objective: Vital Signs: Blood pressure 114/76, pulse 89, temperature 97.6 F (36.4 C), temperature source Oral, resp. rate 19, height 5\' 6"  (1.676 m), weight 52.4 kg (115 lb 8.3 oz), SpO2 99.00%. No results found. Results for orders placed during the hospital encounter of 06/30/13 (from the past 72 hour(s))  GLUCOSE, CAPILLARY     Status: Abnormal   Collection Time    07/12/13 11:30 AM      Result Value Range   Glucose-Capillary 142 (*) 70 - 99 mg/dL   Comment 1 Notify RN    GLUCOSE, CAPILLARY     Status: Abnormal   Collection Time    07/12/13  4:25 PM      Result Value Range   Glucose-Capillary 218 (*) 70 - 99 mg/dL   Comment 1 Notify RN    GLUCOSE, CAPILLARY     Status: Abnormal   Collection Time    07/12/13  8:38 PM      Result Value Range   Glucose-Capillary 205 (*) 70 - 99 mg/dL   Comment 1 Notify RN    GLUCOSE, CAPILLARY     Status: None   Collection Time    07/13/13  7:40 AM      Result Value Range   Glucose-Capillary 75  70 - 99 mg/dL   Comment 1 Notify RN    GLUCOSE, CAPILLARY     Status: Abnormal   Collection Time    07/13/13 11:33 AM      Result Value Range   Glucose-Capillary 177 (*) 70 - 99 mg/dL   Comment 1 Notify RN    GLUCOSE, CAPILLARY     Status: Abnormal   Collection Time    07/13/13  4:28 PM      Result Value Range   Glucose-Capillary 174 (*) 70 - 99 mg/dL   Comment 1 Notify RN    GLUCOSE, CAPILLARY     Status: Abnormal   Collection Time    07/13/13  8:48 PM      Result Value Range   Glucose-Capillary 125 (*) 70 - 99 mg/dL   Comment 1 Notify RN    GLUCOSE, CAPILLARY     Status: Abnormal   Collection Time    07/14/13  7:52 AM      Result Value Range   Glucose-Capillary 104 (*) 70 - 99 mg/dL   Comment 1 Notify RN    URINALYSIS, ROUTINE W REFLEX MICROSCOPIC     Status: Abnormal   Collection Time     07/14/13 10:16 AM      Result Value Range   Color, Urine YELLOW  YELLOW   APPearance CLOUDY (*) CLEAR   Specific Gravity, Urine 1.019  1.005 - 1.030   pH 5.5  5.0 - 8.0   Glucose, UA NEGATIVE  NEGATIVE mg/dL   Hgb urine dipstick SMALL (*) NEGATIVE   Bilirubin Urine NEGATIVE  NEGATIVE   Ketones, ur NEGATIVE  NEGATIVE mg/dL   Protein, ur NEGATIVE  NEGATIVE mg/dL   Urobilinogen, UA 0.2  0.0 - 1.0 mg/dL   Nitrite POSITIVE (*) NEGATIVE   Leukocytes, UA TRACE (*) NEGATIVE  URINE MICROSCOPIC-ADD ON     Status: Abnormal   Collection Time    07/14/13 10:16 AM      Result Value Range   Squamous Epithelial / LPF RARE  RARE   WBC, UA 3-6  <3 WBC/hpf  RBC / HPF 0-2  <3 RBC/hpf   Bacteria, UA MANY (*) RARE  GLUCOSE, CAPILLARY     Status: Abnormal   Collection Time    07/14/13 11:55 AM      Result Value Range   Glucose-Capillary 125 (*) 70 - 99 mg/dL   Comment 1 Notify RN    GLUCOSE, CAPILLARY     Status: None   Collection Time    07/14/13  5:02 PM      Result Value Range   Glucose-Capillary 73  70 - 99 mg/dL   Comment 1 Notify RN    GLUCOSE, CAPILLARY     Status: None   Collection Time    07/15/13  7:41 AM      Result Value Range   Glucose-Capillary 78  70 - 99 mg/dL   Comment 1 Notify RN       HEENT: normal Cardio: RRR and No murmur Resp: CTA B/L and Unlabored GI: BS positive and Nondistended Extremity:  Pulses positive and No Edema Skin:   Intact Neuro: Alert/Oriented, Cranial Nerve II-XII normal, Normal Sensory and Abnormal Motor 4+/5 bilateral deltoid, bicep, tricep, grip.   2+  in the right hip flexor 3-   knee extensor 2 + right ankle dorsiflexor plantar flexor  3  left hip flexor knee extensor ankle dorsiflexor plantar flexor Musc/Skel:  Other Atrophy in the right leg General no acute distress   Assessment/Plan: 1. Functional deficits secondary to  deconditioning after pulmonary embolism and DVT in a patient with chronic paraparesis  which require 3+ hours per day of  interdisciplinary therapy in a comprehensive inpatient rehab setting. Physiatrist is providing close team supervision and 24 hour management of active medical problems listed below. Physiatrist and rehab team continue to assess barriers to discharge/monitor patient progress toward functional and medical goals.  Discuss with team today regarding the conclusions drawn from home evaluation. It looks like it will be a lot for she and her husband to handle at home.      FIM: FIM - Bathing Bathing Steps Patient Completed: Chest;Right Arm;Left Arm;Abdomen;Right upper leg;Left upper leg;Right lower leg (including foot);Left lower leg (including foot);Front perineal area Bathing: 4: Min-Patient completes 8-9 49f 10 parts or 75+ percent  FIM - Upper Body Dressing/Undressing Upper body dressing/undressing steps patient completed: Put head through opening of pull over shirt/dress;Thread/unthread left sleeve of pullover shirt/dress;Thread/unthread right sleeve of pullover shirt/dresss;Thread/unthread right bra strap;Thread/unthread left bra strap;Pull shirt over trunk Upper body dressing/undressing: 4: Min-Patient completed 75 plus % of tasks FIM - Lower Body Dressing/Undressing Lower body dressing/undressing steps patient completed: Don/Doff left sock;Don/Doff right shoe;Don/Doff left shoe;Fasten/unfasten right shoe;Fasten/unfasten left shoe;Thread/unthread right pants leg;Thread/unthread left pants leg;Don/Doff right sock Lower body dressing/undressing: 4: Min-Patient completed 75 plus % of tasks  FIM - Toileting Toileting: 1: Two helpers  FIM - Archivist Transfers: 3-From toilet/BSC: Mod A (lift or lower assist);3-To toilet/BSC: Mod A (lift or lower assist)  FIM - Bed/Chair Transfer Bed/Chair Transfer Assistive Devices: Arm rests Bed/Chair Transfer: 3: Sit > Supine: Mod A (lifting assist/Pt. 50-74%/lift 2 legs);4: Bed > Chair or W/C: Min A (steadying Pt. > 75%);4: Chair or W/C > Bed:  Min A (steadying Pt. > 75%)  FIM - Locomotion: Wheelchair Distance: 100 Locomotion: Wheelchair: 1: Total Assistance/staff pushes wheelchair (Pt<25%) FIM - Locomotion: Ambulation Ambulation/Gait Assistance: Not tested (comment) Locomotion: Ambulation: 0: Activity did not occur  Comprehension Comprehension Mode: Auditory Comprehension: 5-Follows basic conversation/direction: With no assist  Expression Expression Mode: Verbal Expression:  5-Expresses basic needs/ideas: With extra time/assistive device  Social Interaction Social Interaction: 6-Interacts appropriately with others with medication or extra time (anti-anxiety, antidepressant).  Problem Solving Problem Solving: 5-Solves basic problems: With no assist  Memory Memory: 5-Recognizes or recalls 90% of the time/requires cueing < 10% of the time  Medical Problem List and Plan  1. 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 DVT Prophylaxis/Anticoagulation: Xarelto. Monitor for any bleeding episodes  3. Pain Management: Hydrocodone as needed. Monitor with increased mobility  4. Neuropsych: This patient is capable of making decisions on her own behalf.   -needs regular positive reinforcement 5. Sarcoidosis. Continue chronic prednisone. Patient with pronounced bilateral lower extremity weakness  6. Diabetes mellitus with peripheral neuropathy. uncontrolled Hemoglobin A1c 7.4. Amaryl increased to 8 mg daily. Checking blood sugars a.c. and at bedtime , metformin changed to 500mg  qd--increased to 1000mg  qam--(pm dose held).  Need to watch out for low am sugars.  Showing some improvement 7. Hypertension. Lopressor 25 mg twice a day. Monitor with increased mobility  8. Escherichia coli urinary tract infection.  po ceftin completed  9. Urinary retention continue Flomax. Added low dose urecholine  -may need foley. She won't be able to cath herself  -encourage OOB to  void  -low grade temp---ua +, culture pending. Hold on abx at present as she's afebrile 10. Constipation: moving bowels qod essentially LOS (Days) 15 A FACE TO FACE EVALUATION WAS PERFORMED  Eileen Kangas T 07/15/2013, 8:23 AM

## 2013-07-15 NOTE — Discharge Summary (Signed)
Discharge summary job [phone]

## 2013-07-15 NOTE — Progress Notes (Signed)
Physical Therapy Session Note  Patient Details  Name: Elizabeth Drake MRN: [id number] Date of Birth: 06-07-1941  Today's Date: 07/15/2013 Time: Treatment Session 1: 5621-3086; Treatment Session 2: 1100-1130; Treatment Session 3:1300-1330  Time Calculation (min): Treatment Session 1: 45; Treatment Session 2: 30 min; Treatment Session 3:  Short Term Goals: Week 2:  PT Short Term Goal 1 (Week 2): STGs=LTGs due to anticipated LOS  Skilled Therapeutic Interventions/Progress Updates:  Treatment Session 1:  1:1. Pt received sitting in w/c, ready for therapy. Reviewed home visit with pt and discussed what went well and what she found to be a little harder. Pt stated that her husband was often trying to assist her too early vs. Letting her initiate mobility. Pt also expressed difficulty w/ bed mobility 2/2 height of bed. Focus this session on t/f sup<>sit on elevated tx mat to match bed height as well as SBT w/c<>car. Pt req min-mod A for squat pivot t/f w/c<>elevated tx mat and trial use of leg lifter x2 attempts for management of R LE and min A, however, pt demonstrated improved success on 3rd attempt w/ self-management of R LE at min guard level. Pt able to demonstrate successful t/f w/c<>therapy car w/ use of slide board and min-mod A. With use of slide board, pt corrected to lift and move bottom vs. slide. Pt able to propel w/c 75' and 50'x3 this session w/ emphasis on B UE strength. Pt demonstrates very slow pace and fatigues quickly. Pt speaking w/ SW at end of session regarding d/c planning.   Treatment Session 2: 1:1. Pt received sitting in w/c at sink, just completed grooming. Focus this session on t/f sit<>stand, standing endurance as well as dynamic supported sitting balance. Pt able to t/f sit<>stand x5 attempts pushing off of arm rests w/ min guard assist. Pt able to maintain standing in // bars for bouts of 15-25 seconds w/ min guard assist, verbal cues for erect trunk/head posture.  Therapist manually blocking R knee to prevent hyperextension. Pt able to demonstrate blocked stepping of L LE during final two standing attempts, but unable to progress R LE. Pt tended to flowers in day room to target dynamic supported sitting balance while reaching across midline with weighted watering can. Pt sitting in room at end of session w/ all needs in reach.   Treatment Session 3: Pt received being wheeled down hall by rec therapist. Co-tx this session w/ rec therapist and emphasis on problem solving bed mobility due to height of pt's bed that was observed by rec therapist yesterday during home eval. Pt able to perform squat pivot t/f from w/c<>elevated tx mat to match height (30in) of pt's bed at home w/ min A. Pt demonstrating difficulty scooting back on tx mat due to height, suggested use of step stool to safely achieve sitting EOB. With assist of stool, pt then able to complete t/f sup<>sit w/ min A. Therapists further discussing set up of DME along bed for safety and efficiency w/ transfers including w/c<>bed<>BSC. Pt left in care of rec therapist at end of session.   Therapy Documentation Precautions:  Precautions Precautions: Fall Restrictions Weight Bearing Restrictions: No  See FIM for current functional status  Therapy/Group: Individual Therapy and Co-Treatment  Denzil Hughes 07/15/2013, 11:33 AM

## 2013-07-15 NOTE — Progress Notes (Signed)
1000; Pt OOB, attempted to void, unable. Cathed for 400cc ( 8hr.period) 1800; No void in 8 hrs.  Cathed for 400cc.

## 2013-07-15 NOTE — Progress Notes (Signed)
Social Work Patient ID: Elizabeth Drake, female   DOB: 06/17/1941, 72 y.o.   MRN: [id number]  Amada Jupiter, LCSW Social Worker Signed  Patient Care Conference Service date: 07/15/2013 1:51 PM  Inpatient RehabilitationTeam Conference and Plan of Care Update Date: 07/14/2013   Time: 3:05 PM     Patient Name: Elizabeth Drake       Medical Record Number: [id number]   Date of Birth: [birth year] Sex: Female         Room/Bed: 4W23C/4W23C-01 Payor Info: Payor: MEDICARE / Plan: MEDICARE PART A AND B / Product Type: *No Product type* /   Admitting Diagnosis: Sarcold myelopathy   Admit Date/Time:  06/30/2013  7:28 PM Admission Comments: No comment available   Primary Diagnosis:  <principal problem not specified> Principal Problem: <principal problem not specified>    Patient Active Problem List     Diagnosis  Date Noted   .  Physical deconditioning  06/30/2013   .  DKA, type 2  06/25/2013   .  Pulmonary embolism  06/24/2013   .  PE (pulmonary thromboembolism)  06/24/2013   .  Hypoxemia  06/24/2013   .  Tachycardia  06/24/2013   .  Cellulitis of foot without toes, right  06/24/2013   .  DM type 2 (diabetes mellitus, type 2)  04/25/2013   .  OAB (overactive bladder)  04/25/2013   .  Cellulitis of toe of left foot with ulcer  04/25/2013   .  Leukocytosis, unspecified  04/25/2013   .  Paraparesis  04/25/2013   .  Wheelchair bound  04/25/2013   .  UTI (lower urinary tract infection)  04/25/2013   .  Osteoporosis, unspecified  04/25/2013   .  Chronic back pain  04/25/2013   .  Chronic leg pain  04/25/2013   .  OA (osteoarthritis)  04/25/2013   .  Hyperlipidemia  12/31/2012   .  Sarcoidosis  12/31/2012     Expected Discharge Date: Expected Discharge Date: 07/16/13  Team Members Present: Physician leading conference: Dr. Faith Rogue Social Worker Present: Amada Jupiter, LCSW Nurse Present: Carlean Purl, RN PT Present: Cyndia Skeeters, Scot Jun, PT OT Present: [name], Loistine Chance, OT SLP Present: Feliberto Gottron, SLP PPS Coordinator present : Tora Duck, RN, CRRN;Becky Henrene Dodge, PT        Current Status/Progress  Goal  Weekly Team Focus   Medical     urine retention, see prior.   see prior  see prior. bladder ed   Bowel/Bladder     incont. and cont. of bladder&bowel.Last bm 07/11/13.Requires I&O caths for retention.  bowel and bladder managed by pt/family by d/c  Up to the [facility] to void    Swallow/Nutrition/ Hydration            ADL's     min A overall iwth more than reasonable amt of time  overall min  A  family education, home eval   Mobility     Min-Mod A for mobility, supervsion vor w/c propulsion <75', continues to demostrate decreased functional endurance and postural control  Mod(I) for w/c propulsion <100' in controlled/home environments. (S) for dynamic sitting balance; Min A for bed mob, transfers  W/C evaluation, family training, functional endurance, functional transfers   Communication            Safety/Cognition/ Behavioral Observations    No unsafe behaviors noted.  No injury,follows safety plan  Toilet regularly,remind pt to call for assist   Pain  C/o pain in both lower extrem.Vicodin prn  Pain relief,goal 3 or less  Monitor effectiveness of pain med   Skin     1.sacrum pink,2.small black areas to right2nd toe and left 2nd toe  healing w/o further skin issues  Monitor skin and assist pt to turn at night    Rehab Goals Patient on target to meet rehab goals: Yes *See Care Plan and progress notes for long and short-term goals.    Barriers to Discharge:  see prior      Possible Resolutions to Barriers:    see prior      Discharge Planning/Teaching Needs:    home with husband to provide 24/7 assistance      Team Discussion:    Currently on home eval.  Hoping to do some education with husband on this trip.  Concern still over voiding issues.  Pt still plans on d/c home.  May need to change d/c date to complete family ed.    Revisions to Treatment Plan:    None at this time.    Continued Need for Acute Rehabilitation Level of Care: The patient requires daily medical management by a physician with specialized training in physical medicine and rehabilitation for the following conditions: Daily direction of a multidisciplinary physical rehabilitation program to ensure safe treatment while eliciting the highest outcome that is of practical value to the patient.: Yes Daily medical management of patient stability for increased activity during participation in an intensive rehabilitation regime.: Yes Daily analysis of laboratory values and/or radiology reports with any subsequent need for medication adjustment of medical intervention for : Other;Neurological problems  Fortune Brannigan 07/15/2013, 1:51 PM

## 2013-07-15 NOTE — Progress Notes (Signed)
Social Work Patient ID: Elizabeth Drake, female   DOB: 1941-06-08, 72 y.o.   MRN: [id number]  Met with pt to review team conference yesterday afternoon and this morning.  Pt reports she feels that home eval "went real good", however, still undecided about d/c plans.  She feels that husband can meet her care needs, however, he will be unable to attend therapies for education until Friday.  Notes her sister has also committed to being of assist to her at home but pt reluctant to "burden" her.  Reviewed (again) her options of hiring private duty help as well as considering SNF placement.  Pt prefers to "try it at home first".  After discussion with team and MD, plan now to change d/c date to 12/13 to allow completion of family ed.  HH and DME to be arranged.  Continue to follow.  Kamil Hanigan, LCSW

## 2013-07-16 ENCOUNTER — Inpatient Hospital Stay (HOSPITAL_COMMUNITY): Payer: No Typology Code available for payment source

## 2013-07-16 ENCOUNTER — Inpatient Hospital Stay (HOSPITAL_COMMUNITY): Payer: Medicare Other

## 2013-07-16 ENCOUNTER — Encounter (HOSPITAL_COMMUNITY): Payer: No Typology Code available for payment source | Admitting: Occupational Therapy

## 2013-07-16 LAB — URINE CULTURE: Colony Count: >=100000

## 2013-07-16 LAB — GLUCOSE, CAPILLARY
Glucose-Capillary: 232 mg/dL — ABNORMAL HIGH (ref 70–99)
Glucose-Capillary: 198 mg/dL — ABNORMAL HIGH (ref 70–99)

## 2013-07-16 MED ORDER — CEFIXIME 400 MG PO TABS
400.0000 mg | ORAL_TABLET | Freq: Every day | ORAL | Status: DC
  Administered 2013-07-16 – 2013-07-18 (×3): 400 mg via ORAL
  Filled 2013-07-16 (×4): qty 1

## 2013-07-16 NOTE — Discharge Summary (Signed)
NAMESHANDRA, SZYMBORSKI NO.:  [id number]  MEDICAL RECORD NO.:  [id number]  LOCATION:  4W23C                        FACILITY:  [facility]  PHYSICIAN:  Mariam Dollar, P.A.  DATE OF BIRTH:  02-25-1941  DATE OF ADMISSION:  06/30/2013 DATE OF DISCHARGE:  07/18/2013                              DISCHARGE SUMMARY   DISCHARGE DIAGNOSES: 1. 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 2. Xarelto for DVT. 3. Pain management. 4. Sarcoidosis. 5. Diabetes mellitus with peripheral neuropathy. 6. Hypertension. 7. Escherichia coli urinary tract infection, resolved. 8. Urinary retention. 9. Constipation.  HISTORY OF PRESENT ILLNESS:  This is a 72 year old right-handed female with history of sarcoidosis with prednisone therapy, recently treated for a third toe cellulitis right foot.  The patient is wheelchair bound prior to admission except independent for transfers, no ambulation. Admitted on June 24, 2013 for shortness of breath and chest heaviness that worsened with minimal exertion.  Troponin and cardiac enzymes negative.  CT angiogram of the chest showed extensive pulmonary embolus with evidence of right ventricular strain.  Noted pulmonary emboli involving significant portions of both right and left main pulmonary arteries.  The patient placed on intravenous heparin, transitioned to Xarelto.  Venous Doppler studies of lower extremities on June 25, 2013, with findings consistent of DVT, distal femoral popliteal vein of lower extremity.  Urine culture greater than 100,000 E. coli maintained on Rocephin.  Physical and occupational therapy ongoing.  The patient was admitted for comprehensive rehab program.  PAST MEDICAL HISTORY:  See discharge diagnoses.  SOCIAL HISTORY:  Wheelchair bound except independent for transfers.  The patient lives with spouse.  FUNCTIONAL STATUS:  Upon admission to  rehab service is moderate assist to scoot to the edge of the bed.  PHYSICAL EXAMINATION:  VITAL SIGNS:  Blood pressure 153/82, pulse 86, temperature 98, respirations 18. GENERAL:  This was an alert female, oriented x3.  Pupils were round and reactive to light.  Follows full commands. LUNGS:  Clear to auscultation. CARDIAC:  Regular rate and rhythm. ABDOMEN:  Soft, nontender.  Good bowel sounds.  REHABILITATION HOSPITAL COURSE:  The patient was admitted to inpatient rehab services with therapies initiated on a 3-hour daily basis consisting of physical therapy, occupational therapy, and rehabilitation nursing.  The following issues were addressed during the patient's rehabilitation stay.  Pertaining to Ms. Faust's deconditioning secondary to DVT, pulmonary emboli remained stable.  She continued on Xarelto with no bleeding episodes.  Pain management with the use of hydrocodone and good results.  She did have a history of sarcoidosis with chronic prednisone therapy pronounced bilateral lower extremity weakness. Diabetes mellitus with peripheral neuropathy.  Hemoglobin A1c of 7.4. She remained on Amaryl as well as Glucophage.  Her blood pressures were well controlled on Lopressor 25 mg b.i.d.  She completed a course of Ceftin for E. coli urinary tract infection.  She did have some urinary retention maintained on Flomax as well as introduction of Urecholine and monitored.  Bowel program established for some constipation.  The patient received weekly collaborative interdisciplinary team conferences to discuss estimated length of stay, family teaching, and any  barriers to her discharge.  She was moderate assist for squat pivot, wheelchair, bedside commode, total assist x2 for managing clothing and peri care. Still needing some assistance for lower body activities of daily living with full family teaching completed.  Plan was to be discharged to home with ongoing therapy dictated per [facility].  DISCHARGE MEDICATIONS:  Included; 1. Urecholine 10 mg p.o. t.i.d. taper as directed. 2. Calcium one tablet daily. 3. Amaryl 8 mg p.o. daily. 4. Hydrocodone 1-2 tablets every 4 hours as needed moderate pain,     dispense of 60 tablets. 5. Glucophage 1000 mg p.o. daily. 6. Lopressor 25 mg p.o. b.i.d. 7. Multivitamin one tablet daily. 8. MiraLax daily as needed constipation. 9. Prednisone 10 mg p.o. daily. 10.Xarelto 20 mg daily. 11.Flomax 0.4 mg p.o. daily.  DIET:  Diabetic diet.  SPECIAL INSTRUCTIONS:  The patient would follow up with Dr. Ernestina Penna, Medical Management; Dr. Ranelle Oyster, the outpatient rehab service office as needed.     Mariam Dollar, P.A.     DA/MEDQ  D:  07/15/2013  T:  07/16/2013  Job:  [id number]  cc:   Ranelle Oyster, M.D. Ernestina Penna, M.D.

## 2013-07-16 NOTE — Progress Notes (Signed)
Subjective/Complaints:   No new issues reported. Denies pain. Bladder dribbling.  Review of Systems - otherwise negative  Objective: Vital Signs: Blood pressure 115/62, pulse 78, temperature 97.1 F (36.2 C), temperature source Oral, resp. rate 18, height 5\' 6"  (1.676 m), weight 52.572 kg (115 lb 14.4 oz), SpO2 95.00%. No results found. Results for orders placed during the hospital encounter of 06/30/13 (from the past 72 hour(s))  GLUCOSE, CAPILLARY     Status: Abnormal   Collection Time    07/13/13 11:33 AM      Result Value Range   Glucose-Capillary 177 (*) 70 - 99 mg/dL   Comment 1 Notify RN    GLUCOSE, CAPILLARY     Status: Abnormal   Collection Time    07/13/13  4:28 PM      Result Value Range   Glucose-Capillary 174 (*) 70 - 99 mg/dL   Comment 1 Notify RN    GLUCOSE, CAPILLARY     Status: Abnormal   Collection Time    07/13/13  8:48 PM      Result Value Range   Glucose-Capillary 125 (*) 70 - 99 mg/dL   Comment 1 Notify RN    GLUCOSE, CAPILLARY     Status: Abnormal   Collection Time    07/14/13  7:52 AM      Result Value Range   Glucose-Capillary 104 (*) 70 - 99 mg/dL   Comment 1 Notify RN    URINE CULTURE     Status: None   Collection Time    07/14/13 10:16 AM      Result Value Range   Specimen Description URINE, CATHETERIZED     Special Requests NONE     Culture  Setup Time       Value: 07/14/2013 15:03     Performed at [facility] Count       Value: >=100,000 COLONIES/ML     Performed at [facility]   Culture       Value: ESCHERICHIA COLI     Performed at [facility]   Report Status 07/16/2013 FINAL     Organism ID, Bacteria ESCHERICHIA COLI    URINALYSIS, ROUTINE W REFLEX MICROSCOPIC     Status: Abnormal   Collection Time    07/14/13 10:16 AM      Result Value Range   Color, Urine YELLOW  YELLOW   APPearance CLOUDY (*) CLEAR   Specific Gravity, Urine 1.019  1.005 - 1.030   pH 5.5  5.0 - 8.0   Glucose, UA NEGATIVE   NEGATIVE mg/dL   Hgb urine dipstick SMALL (*) NEGATIVE   Bilirubin Urine NEGATIVE  NEGATIVE   Ketones, ur NEGATIVE  NEGATIVE mg/dL   Protein, ur NEGATIVE  NEGATIVE mg/dL   Urobilinogen, UA 0.2  0.0 - 1.0 mg/dL   Nitrite POSITIVE (*) NEGATIVE   Leukocytes, UA TRACE (*) NEGATIVE  URINE MICROSCOPIC-ADD ON     Status: Abnormal   Collection Time    07/14/13 10:16 AM      Result Value Range   Squamous Epithelial / LPF RARE  RARE   WBC, UA 3-6  <3 WBC/hpf   RBC / HPF 0-2  <3 RBC/hpf   Bacteria, UA MANY (*) RARE  GLUCOSE, CAPILLARY     Status: Abnormal   Collection Time    07/14/13 11:55 AM      Result Value Range   Glucose-Capillary 125 (*) 70 - 99 mg/dL   Comment 1  Notify RN    GLUCOSE, CAPILLARY     Status: None   Collection Time    07/14/13  5:02 PM      Result Value Range   Glucose-Capillary 73  70 - 99 mg/dL   Comment 1 Notify RN    GLUCOSE, CAPILLARY     Status: None   Collection Time    07/15/13  7:41 AM      Result Value Range   Glucose-Capillary 78  70 - 99 mg/dL   Comment 1 Notify RN    GLUCOSE, CAPILLARY     Status: Abnormal   Collection Time    07/15/13 11:29 AM      Result Value Range   Glucose-Capillary 141 (*) 70 - 99 mg/dL   Comment 1 Notify RN    GLUCOSE, CAPILLARY     Status: Abnormal   Collection Time    07/15/13  4:16 PM      Result Value Range   Glucose-Capillary 193 (*) 70 - 99 mg/dL   Comment 1 Notify RN    GLUCOSE, CAPILLARY     Status: Abnormal   Collection Time    07/15/13  8:42 PM      Result Value Range   Glucose-Capillary 149 (*) 70 - 99 mg/dL  GLUCOSE, CAPILLARY     Status: None   Collection Time    07/16/13  7:19 AM      Result Value Range   Glucose-Capillary 95  70 - 99 mg/dL   Comment 1 Notify RN       HEENT: normal Cardio: RRR and No murmur Resp: CTA B/L and Unlabored GI: BS positive and Nondistended Extremity:  Pulses positive and No Edema Skin:   Intact Neuro: Alert/Oriented, Cranial Nerve II-XII normal, Normal Sensory and  Abnormal Motor 4+/5 bilateral deltoid, bicep, tricep, grip.   2+  in the right hip flexor 3-   knee extensor 2 + right ankle dorsiflexor plantar flexor  3  left hip flexor knee extensor ankle dorsiflexor plantar flexor Musc/Skel:  Other Atrophy in the right leg General no acute distress   Assessment/Plan: 1. Functional deficits secondary to  deconditioning after pulmonary embolism and DVT in a patient with chronic paraparesis  which require 3+ hours per day of interdisciplinary therapy in a comprehensive inpatient rehab setting. Physiatrist is providing close team supervision and 24 hour management of active medical problems listed below. Physiatrist and rehab team continue to assess barriers to discharge/monitor patient progress toward functional and medical goals.  Discuss with team today regarding the conclusions drawn from home evaluation. It looks like it will be a lot for she and her husband to handle at home.      FIM: FIM - Bathing Bathing Steps Patient Completed: Chest;Right Arm;Left Arm;Abdomen;Right upper leg;Left upper leg;Right lower leg (including foot);Left lower leg (including foot);Front perineal area;Buttocks Bathing: 5: Set-up assist to: Adjust water temp  FIM - Upper Body Dressing/Undressing Upper body dressing/undressing steps patient completed: Put head through opening of pull over shirt/dress;Thread/unthread left sleeve of pullover shirt/dress;Thread/unthread right sleeve of pullover shirt/dresss;Thread/unthread right bra strap;Thread/unthread left bra strap;Pull shirt over trunk Upper body dressing/undressing: 4: Min-Patient completed 75 plus % of tasks FIM - Lower Body Dressing/Undressing Lower body dressing/undressing steps patient completed: Don/Doff left sock;Don/Doff right shoe;Don/Doff left shoe;Fasten/unfasten right shoe;Fasten/unfasten left shoe;Thread/unthread right pants leg;Thread/unthread left pants leg;Don/Doff right sock Lower body dressing/undressing: 4:  Steadying Assist  FIM - Toileting Toileting: 1: Two helpers  FIM - Archivist Transfers: 3-From  toilet/BSC: Mod A (lift or lower assist);3-To toilet/BSC: Mod A (lift or lower assist)  FIM - Bed/Chair Transfer Bed/Chair Transfer Assistive Devices: Arm rests Bed/Chair Transfer: 4: Supine > Sit: Min A (steadying Pt. > 75%/lift 1 leg);4: Sit > Supine: Min A (steadying pt. > 75%/lift 1 leg);3: Chair or W/C > Bed: Mod A (lift or lower assist);3: Bed > Chair or W/C: Mod A (lift or lower assist)  FIM - Locomotion: Wheelchair Distance: 100 Locomotion: Wheelchair: 1: Travels less than 50 ft with supervision, cueing or coaxing FIM - Locomotion: Ambulation Ambulation/Gait Assistance: Not tested (comment) Locomotion: Ambulation: 0: Activity did not occur  Comprehension Comprehension Mode: Auditory Comprehension: 5-Follows basic conversation/direction: With extra time/assistive device  Expression Expression Mode: Verbal Expression: 5-Expresses basic needs/ideas: With extra time/assistive device  Social Interaction Social Interaction: 6-Interacts appropriately with others with medication or extra time (anti-anxiety, antidepressant).  Problem Solving Problem Solving: 5-Solves basic problems: With no assist  Memory Memory: 5-Recognizes or recalls 90% of the time/requires cueing < 10% of the time  Medical Problem List and Plan  1. 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 DVT Prophylaxis/Anticoagulation: Xarelto. Monitor for any bleeding episodes  3. Pain Management: Hydrocodone as needed. Monitor with increased mobility  4. Neuropsych: This patient is capable of making decisions on her own behalf.   -needs regular positive reinforcement 5. Sarcoidosis. Continue chronic prednisone. Patient with pronounced bilateral lower extremity weakness  6. Diabetes mellitus with peripheral neuropathy. uncontrolled  Hemoglobin A1c 7.4. Amaryl increased to 8 mg daily. Checking blood sugars a.c. and at bedtime , metformin changed to 500mg  qd--increased to 1000mg  qam--(pm dose held).  Need to watch out for low am sugars.  Showing some improvement 7. Hypertension. Lopressor 25 mg twice a day. Monitor with increased mobility  8. Escherichia coli urinary tract infection.  + again. Add suprax 9. Urinary retention continue Flomax. Added low dose urecholine  -may need foley. She won't be able to cath herself  -encourage OOB to void  -rx UTI 10. Constipation: moving bowels qod essentially LOS (Days) 16 A FACE TO FACE EVALUATION WAS PERFORMED  Kynli Chou T 07/16/2013, 8:36 AM

## 2013-07-16 NOTE — Progress Notes (Signed)
Recreational Therapy Session Note  Patient Details  Name: Elizabeth Drake MRN: [id number] Date of Birth: Aug 22, 1940 Today's Date: 07/16/2013  Pain: no c/o Skilled Therapeutic Interventions/Progress Updates: Session focused on activity tolerance, w/c mobility in community environment, dynamic sitting balance reaching outside BOS.  Pt required supervision-min assist for w/c mobility, min assist for obstacle negotiation in tightly spaced areas.  Pt supervision for w/c mobility otherwise & for reaching activities.  Pt's states "i love to shop...this is the hightlight of her day" Izael Bessinger 07/16/2013, 4:06 PM

## 2013-07-16 NOTE — Progress Notes (Signed)
Recreational Therapy Session Note  Patient Details  Name: Elizabeth Drake MRN: [id number] Date of Birth: [birth year] Today's Date: 07/16/2013 LATE ENTRY FOR 07/15/13 Pain: no c/o Skilled Therapeutic Interventions/Progress Updates: Session focused on discharge planning during co-treat with PT based on home eval findings.  Pt stated her sister has committed to providing assistance at home in addition to pt's husband.  Pt performed squat pivot transfers during session with min assist. Young Mulvey 07/16/2013, 8:39 AM

## 2013-07-16 NOTE — Progress Notes (Signed)
Physical Therapy Session Note  Patient Details  Name: Elizabeth Drake MRN: [id number] Date of Birth: 11-12-1940  Today's Date: 07/16/2013 Time: Treatment Session 1: 1030-1130;Treatment Session 2: 1400-1445 Time Calculation (min): Treatment Session 1: 60 min; Treatment Session 2:  Short Term Goals: Week 2:  PT Short Term Goal 1 (Week 2): STGs=LTGs due to anticipated LOS  Skilled Therapeutic Interventions/Progress Updates:  Treatment Session 1:  Pt received sitting in w/c, ready for therapy. Co-tx this session w/ rec therapist. Focus majority of tx session on w/c management in gift shop to target obstacle negotiation in tight spaces on carpet like home environment. Pt able to propel w/c 25'x5 w/ supervision-occasional min A for obstacle negotiation. Pt demonstrates slow, but steady pace and req prolonged rest breaks between bouts. Additional focus on dynamic sitting balance in wheel chair as pt looked at items in gift shop. Pt able to reach +/- midline in all directions with supervision. Pt able to self direct care or needs when unable to reach item. Further discussion w/ rec therapist at end of session regarding ways to safely return to hobbies and participate in holiday functions (church) when home w/ emphasis on energy conservation and w/c accessibility. Pt sitting in w/c at end of session w/ all needs in reach.   Treatment Session 2:  1:1. Pt received sitting in w/c,ready for therapy but verbalized feeling fatigued. Focus this session on functional transfers as well as education regarding goals for family training tomorrow and goals for [facility] PT upon d/c. Pt able to perform t/f w/c<>therapy car w/ use of slide board and min A overall. Emphasis on pt verbally directing setup and when needing assistance during car t/f. Pt req min verbal cues for correction of seq. Pt verbalized understanding of all education this session. Attempted to practice standing endurance, however, pt unable to complete  >1stand 2/2 fatigue. Pt req min A for squat pivot t/f w/c<>bed and mod A t/f sit>sup. Once in bed, pt able to reposition w/ min A. Pt semi-reclined in bed at end of session w/ all needs in reach, bed alarm on.    Therapy Documentation Precautions:  Precautions Precautions: Fall Restrictions Weight Bearing Restrictions: No   Pain: Pain Assessment Pain Assessment: 0-10 Pain Score: 7  Pain Type: Chronic pain Pain Location: Leg Pain Orientation: Right;Left Pain Descriptors / Indicators: Aching Pain Frequency: Intermittent Pain Onset: On-going Patients Stated Pain Goal: 3 Pain Intervention(s): Medication (See eMAR);Repositioned;Emotional support Multiple Pain Sites: No  See FIM for current functional status  Therapy/Group: Individual Therapy and Co-Treatment  Denzil Hughes 07/16/2013, 12:23 PM

## 2013-07-16 NOTE — Progress Notes (Signed)
Occupational Therapy Note  Patient Details  Name: Elizabeth Drake MRN: [id number] Date of Birth: 06-10-1941 Today's Date: 07/16/2013  Time: 1330-1400 Pt denies pain Individual Therapy  Pt resting in w/c upon arrival "taking a nap" but was easily aroused.  Pt engaged in practicing sit<>stand from w/c with raised surface in front.  Pt performs sit->stand with supervision but requires assistance when performing stand->sit.  Pt exhibits difficulty controlling descent into w/c.  Pt only able to stand for ~5 secs before requiring to sit down.  Pt practiced removing alternating UE from position of support to simulate pulling up pants while standing.  Focus on standing endurance/strength, sit<>stand, standing balance, and activity tolerance.   Lavone Neri [facility] 07/16/2013, 2:43 PM

## 2013-07-16 NOTE — Progress Notes (Signed)
Occupational Therapy Session Note  Patient Details  Name: Elizabeth Drake MRN: [id number] Date of Birth: 1941-04-28  Today's Date: 07/16/2013 Time: 0815-0915 Time Calculation (min): 60 min  Short Term Goals: Week 2:  OT Short Term Goal 1 (Week 2): complete family education   Skilled Therapeutic Interventions/Progress Updates:    1:1 Self care retraining at sink level simulating how she will perform basic ADL tasks at the kitchen sink. Pt able to perform sit to stands multiple times with supervision in prep for peri hygiene and clothing management. Once standing pt required steady A to maintain standing balance in order to complete functional task of hygiene/ clothing management. Pt still requires multiple rest breaks throughout session due to fatigue.  Performed toilet transfer with drop arm commode with min A to continue to attempt voiding her bladder.   Therapy Documentation Precautions:  Precautions Precautions: Fall Restrictions Weight Bearing Restrictions: No Pain: No c/o pain  See FIM for current functional status  Therapy/Group: Individual Therapy  [name] [facility] 07/16/2013, 10:08 AM

## 2013-07-17 ENCOUNTER — Inpatient Hospital Stay (HOSPITAL_COMMUNITY): Payer: Medicare Other

## 2013-07-17 ENCOUNTER — Encounter (HOSPITAL_COMMUNITY): Payer: No Typology Code available for payment source | Admitting: Occupational Therapy

## 2013-07-17 LAB — GLUCOSE, CAPILLARY
Glucose-Capillary: 204 mg/dL — ABNORMAL HIGH (ref 70–99)
Glucose-Capillary: 195 mg/dL — ABNORMAL HIGH (ref 70–99)

## 2013-07-17 MED ORDER — RIVAROXABAN 20 MG PO TABS: 20.0000 mg | ORAL_TABLET | Freq: Every day | ORAL | Status: DC

## 2013-07-17 MED ORDER — METOPROLOL TARTRATE 25 MG PO TABS: 25.0000 mg | ORAL_TABLET | Freq: Two times a day (BID) | ORAL | Status: DC

## 2013-07-17 MED ORDER — PREDNISONE 5 MG PO TABS: 10.0000 mg | ORAL_TABLET | Freq: Every day | ORAL | Status: DC

## 2013-07-17 MED ORDER — METFORMIN HCL 1000 MG PO TABS: 1000.0000 mg | ORAL_TABLET | Freq: Every day | ORAL | Status: DC

## 2013-07-17 MED ORDER — HYDROCODONE-ACETAMINOPHEN 5-325 MG PO TABS: 1.0000 | ORAL_TABLET | ORAL | Status: DC | PRN

## 2013-07-17 MED ORDER — TAMSULOSIN HCL 0.4 MG PO CAPS: 0.4000 mg | ORAL_CAPSULE | Freq: Every day | ORAL | Status: DC

## 2013-07-17 MED ORDER — GLIMEPIRIDE 4 MG PO TABS: 8.0000 mg | ORAL_TABLET | Freq: Every day | ORAL | Status: DC

## 2013-07-17 NOTE — Progress Notes (Signed)
Subjective/Complaints:   Urinary dribbling (incontinent)--unable to empty when she attempt to void  Review of Systems - otherwise negative  Objective: Vital Signs: Blood pressure 110/68, pulse 70, temperature 98.1 F (36.7 C), temperature source Oral, resp. rate 18, height 5\' 6"  (1.676 m), weight 51.5 kg (113 lb 8.6 oz), SpO2 97.00%. No results found. Results for orders placed during the hospital encounter of 06/30/13 (from the past 72 hour(s))  URINE CULTURE     Status: None   Collection Time    07/14/13 10:16 AM      Result Value Range   Specimen Description URINE, CATHETERIZED     Special Requests NONE     Culture  Setup Time       Value: 07/14/2013 15:03     Performed at [facility] Count       Value: >=100,000 COLONIES/ML     Performed at [facility]   Culture       Value: ESCHERICHIA COLI     Performed at [facility]   Report Status 07/16/2013 FINAL     Organism ID, Bacteria ESCHERICHIA COLI    URINALYSIS, ROUTINE W REFLEX MICROSCOPIC     Status: Abnormal   Collection Time    07/14/13 10:16 AM      Result Value Range   Color, Urine YELLOW  YELLOW   APPearance CLOUDY (*) CLEAR   Specific Gravity, Urine 1.019  1.005 - 1.030   pH 5.5  5.0 - 8.0   Glucose, UA NEGATIVE  NEGATIVE mg/dL   Hgb urine dipstick SMALL (*) NEGATIVE   Bilirubin Urine NEGATIVE  NEGATIVE   Ketones, ur NEGATIVE  NEGATIVE mg/dL   Protein, ur NEGATIVE  NEGATIVE mg/dL   Urobilinogen, UA 0.2  0.0 - 1.0 mg/dL   Nitrite POSITIVE (*) NEGATIVE   Leukocytes, UA TRACE (*) NEGATIVE  URINE MICROSCOPIC-ADD ON     Status: Abnormal   Collection Time    07/14/13 10:16 AM      Result Value Range   Squamous Epithelial / LPF RARE  RARE   WBC, UA 3-6  <3 WBC/hpf   RBC / HPF 0-2  <3 RBC/hpf   Bacteria, UA MANY (*) RARE  GLUCOSE, CAPILLARY     Status: Abnormal   Collection Time    07/14/13 11:55 AM      Result Value Range   Glucose-Capillary 125 (*) 70 - 99 mg/dL   Comment  1 Notify RN    GLUCOSE, CAPILLARY     Status: None   Collection Time    07/14/13  5:02 PM      Result Value Range   Glucose-Capillary 73  70 - 99 mg/dL   Comment 1 Notify RN    GLUCOSE, CAPILLARY     Status: None   Collection Time    07/15/13  7:41 AM      Result Value Range   Glucose-Capillary 78  70 - 99 mg/dL   Comment 1 Notify RN    GLUCOSE, CAPILLARY     Status: Abnormal   Collection Time    07/15/13 11:29 AM      Result Value Range   Glucose-Capillary 141 (*) 70 - 99 mg/dL   Comment 1 Notify RN    GLUCOSE, CAPILLARY     Status: Abnormal   Collection Time    07/15/13  4:16 PM      Result Value Range   Glucose-Capillary 193 (*) 70 - 99 mg/dL  Comment 1 Notify RN    GLUCOSE, CAPILLARY     Status: Abnormal   Collection Time    07/15/13  8:42 PM      Result Value Range   Glucose-Capillary 149 (*) 70 - 99 mg/dL  GLUCOSE, CAPILLARY     Status: None   Collection Time    07/16/13  7:19 AM      Result Value Range   Glucose-Capillary 95  70 - 99 mg/dL   Comment 1 Notify RN    GLUCOSE, CAPILLARY     Status: Abnormal   Collection Time    07/16/13 11:37 AM      Result Value Range   Glucose-Capillary 162 (*) 70 - 99 mg/dL   Comment 1 Notify RN    GLUCOSE, CAPILLARY     Status: Abnormal   Collection Time    07/16/13  4:19 PM      Result Value Range   Glucose-Capillary 232 (*) 70 - 99 mg/dL   Comment 1 Notify RN    GLUCOSE, CAPILLARY     Status: Abnormal   Collection Time    07/16/13  8:31 PM      Result Value Range   Glucose-Capillary 198 (*) 70 - 99 mg/dL  GLUCOSE, CAPILLARY     Status: None   Collection Time    07/17/13  7:38 AM      Result Value Range   Glucose-Capillary 78  70 - 99 mg/dL   Comment 1 Notify RN       HEENT: normal Cardio: RRR and No murmur Resp: CTA B/L and Unlabored GI: BS positive and Nondistended Extremity:  Pulses positive and No Edema Skin:   Intact Neuro: Alert/Oriented, Cranial Nerve II-XII normal, Normal Sensory and Abnormal Motor  4+/5 bilateral deltoid, bicep, tricep, grip.   2+  in the right hip flexor 3-   knee extensor 2 + right ankle dorsiflexor plantar flexor  3  left hip flexor knee extensor ankle dorsiflexor plantar flexor Musc/Skel:  Other Atrophy in the right leg General no acute distress   Assessment/Plan: 1. Functional deficits secondary to  deconditioning after pulmonary embolism and DVT in a patient with chronic paraparesis  which require 3+ hours per day of interdisciplinary therapy in a comprehensive inpatient rehab setting. Physiatrist is providing close team supervision and 24 hour management of active medical problems listed below. Physiatrist and rehab team continue to assess barriers to discharge/monitor patient progress toward functional and medical goals.     home tomorrow. Dc planning underway   FIM: FIM - Bathing Bathing Steps Patient Completed: Chest;Right Arm;Left Arm;Abdomen;Right upper leg;Left upper leg;Right lower leg (including foot);Left lower leg (including foot);Front perineal area;Buttocks Bathing: 4: Steadying assist (sit to stand)  FIM - Upper Body Dressing/Undressing Upper body dressing/undressing steps patient completed: Put head through opening of pull over shirt/dress;Thread/unthread left sleeve of pullover shirt/dress;Thread/unthread right sleeve of pullover shirt/dresss;Thread/unthread right bra strap;Thread/unthread left bra strap;Pull shirt over trunk;Hook/unhook bra Upper body dressing/undressing: 5: Set-up assist to: Obtain clothing/put away FIM - Lower Body Dressing/Undressing Lower body dressing/undressing steps patient completed: Don/Doff left sock;Don/Doff right shoe;Don/Doff left shoe;Fasten/unfasten right shoe;Fasten/unfasten left shoe;Thread/unthread right pants leg;Thread/unthread left pants leg;Don/Doff right sock;Thread/unthread right underwear leg;Thread/unthread left underwear leg;Pull underwear up/down Lower body dressing/undressing: 4: Steadying  Assist  FIM - Toileting Toileting: 1: Two helpers  FIM - Archivist Transfers: 4-To toilet/BSC: Min A (steadying Pt. > 75%);4-From toilet/BSC: Min A (steadying Pt. > 75%)  FIM - Banker  Devices: Arm rests Bed/Chair Transfer: 3: Supine > Sit: Mod A (lifting assist/Pt. 50-74%/lift 2 legs;4: Chair or W/C > Bed: Min A (steadying Pt. > 75%);4: Bed > Chair or W/C: Min A (steadying Pt. > 75%)  FIM - Locomotion: Wheelchair Distance: 75' Locomotion: Wheelchair: 1: Travels less than 50 ft with minimal assistance (Pt.>75%) FIM - Locomotion: Ambulation Ambulation/Gait Assistance: Not tested (comment) Locomotion: Ambulation: 0: Activity did not occur  Comprehension Comprehension Mode: Auditory Comprehension: 6-Follows complex conversation/direction: With extra time/assistive device  Expression Expression Mode: Verbal Expression: 6-Expresses complex ideas: With extra time/assistive device  Social Interaction Social Interaction: 6-Interacts appropriately with others with medication or extra time (anti-anxiety, antidepressant).  Problem Solving Problem Solving: 6-Solves complex problems: With extra time  Memory Memory: 5-Requires cues to use assistive device  Medical Problem List and Plan  1. 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 DVT Prophylaxis/Anticoagulation: Xarelto. Monitor for any bleeding episodes  3. Pain Management: Hydrocodone as needed. Monitor with increased mobility  4. Neuropsych: This patient is capable of making decisions on her own behalf.   -needs regular positive reinforcement 5. Sarcoidosis. Continue chronic prednisone. Patient with pronounced bilateral lower extremity weakness  6. Diabetes mellitus with peripheral neuropathy. uncontrolled Hemoglobin A1c 7.4. Amaryl increased to 8 mg daily. Checking blood sugars a.c. and at bedtime , metformin  changed to 500mg  qd--increased to 1000mg  qam--(pm dose held).  Still some lability but showing improvement 7. Hypertension. Lopressor 25 mg twice a day. Monitor with increased mobility  8. Escherichia coli urinary tract infection.  + again. Added suprax 9. Urinary retention continue Flomax.    -replace foley today in expectation of going home  -stop urecholine  -rx UTI 10. Constipation: moving bowels qod essentially LOS (Days) 17 A FACE TO FACE EVALUATION WAS PERFORMED  Elizabeth Drake T 07/17/2013, 9:59 AM

## 2013-07-17 NOTE — Progress Notes (Signed)
Occupational Therapy Discharge Summary  Patient Details  Name: Elizabeth Drake MRN: [id number] Date of Birth: [birth year]  Today's Date: 07/17/2013 Time: 1100-1200 Time Calculation (min): 60 min  1:1 Family education with pt and pt's husband with focus on bathing and dressing at sink level, squat pivot transfers to w/c and BSC, sit to stand and how to provide steady A, problem solving home environment for w/c management and recommendation for other DME placement.  Patient has met 8 of 8 long term goals due to improved activity tolerance, improved balance, postural control, ability to compensate for deficits and functional use of  RIGHT upper, RIGHT lower, LEFT upper and LEFT lower extremity.  Patient to discharge at [facility] Assist level for basic ADL tasks at sink level.  Pt can not access her bathroom via her w/c so bathing, dressing and grooming were recommended to occur in kitchen using the table or counter to steady herself with standing.  Pt also is not voiding consistently and will d/c home with a foley. Patient's care partner (husband) is independent to provide the necessary physical assistance at discharge.    Reasons goals not met: n/a  Recommendation:  Patient will benefit from ongoing skilled OT services in home health setting to continue to advance functional skills in the area of BADL and Reduce care partner burden.  Equipment: custom w/c, drop arm commode  Reasons for discharge: treatment goals met and discharge from hospital  Patient/family agrees with progress made and goals achieved: Yes  OT Discharge Precautions/Restrictions   fall Pain  no c/o pain in session  ADL  see FIM Vision/Perception  Vision - History Baseline Vision: Wears glasses only for reading Visual History: Retinopathy Vision - Assessment Vision Assessment: Vision not tested Perception Perception: Within Functional Limits Praxis Praxis: Intact  Cognition Overall Cognitive Status: Within  Functional Limits for tasks assessed Arousal/Alertness: Awake/alert Orientation Level: Oriented X4 Attention: Selective;Alternating Selective Attention: Appears intact Alternating Attention: Appears intact Memory: Appears intact Awareness: Appears intact Problem Solving: Appears intact Safety/Judgment: Appears intact Comments: Safety intact, however, not realistic at this time regarding ability to d/c at Mod(I) level Sensation Sensation Light Touch: Appears Intact Hot/Cold: Appears Intact Proprioception: Appears Intact Coordination Gross Motor Movements are Fluid and Coordinated: No Fine Motor Movements are Fluid and Coordinated: No Motor    Mobility     Trunk/Postural Assessment  Cervical Assessment Cervical Assessment: Exceptions to [facility] Cervical Strength Overall Cervical Strength Comments: forward head, able to sustain upright head positioning w/ back support, w/out back support only able to sustain for approx  Thoracic Assessment Thoracic Assessment: Exceptions to [facility] Thoracic AROM Thoracic Flexion: Pt demonstrating significant kyphontic posture w/ scoliosis, L lean Thoracic Strength Overall Thoracic Strength Comments: Able to maintain upright posure w/ B UE support  Lumbar Assessment Lumbar Assessment: Exceptions to [facility] Lumbar AROM Overall Lumbar AROM Comments: Pt demonstrating posterior pelvic tilt Lumbar Strength Overall Lumbar Strength Comments: Unable to sustain corrected positioning  Balance Static Sitting Balance Static Sitting - Balance Support: Bilateral upper extremity supported;Feet supported Static Sitting - Level of Assistance: 5: Stand by assistance Dynamic Sitting Balance Dynamic Sitting - Balance Support: Left upper extremity supported;Feet supported;Right upper extremity supported;Bilateral upper extremity supported Dynamic Sitting - Level of Assistance: 5: Stand by assistance Static Standing Balance Static Standing - Balance Support: Bilateral  upper extremity supported Static Standing - Level of Assistance: 4: Min assist Extremity/Trunk Assessment RUE Assessment RUE Assessment: Within Functional Limits LUE Assessment LUE Assessment: Within Functional Limits  See FIM  for current functional status  [name] [facility] 07/17/2013, 3:34 PM

## 2013-07-17 NOTE — Progress Notes (Signed)
Recreational Therapy Discharge Summary Patient Details  Name: Elizabeth Drake MRN: [id number] Date of Birth: October 29, 1940 Today's Date: 07/17/2013  Long term goals set: 1  Long term goals met: 1  Comments on progress toward goals: Pt has made great progress toward goal and is ready for discharge home with husband/family to provide 24 hour supervision/assist.  Pt requires supervision/set up assist for simple TR tasks w/c level. Reasons for discharge: discharge from hospital  Patient/family agrees with progress made and goals achieved: Yes  America Sandall 07/17/2013, 8:52 AM

## 2013-07-17 NOTE — Progress Notes (Signed)
Physical Therapy Discharge Summary  Patient Details  Name: Elizabeth Drake MRN: [id number] Date of Birth: 21-Feb-1941  Today's Date: 07/17/2013 Time: Treatment Session 1: [phone]; Treatment Session 2:1300-1400 Time Calculation (min): Treatment Session 1: ; Treatment Session 2: 60 min  Patient has met 5 of 6 long term goals due to improved activity tolerance, improved balance, improved postural control, increased strength, ability to compensate for deficits and functional use of  right lower extremity and left lower extremity.  Patient to discharge at a wheelchair level Min Assist.   Patient's care partner is independent to provide the necessary physical assistance at discharge.  Reasons goals not met: Pt did not achieve LTG regarding w/c propulsion in controlled environment due to decreased functional endurance. Pt able to propel w/c at mod(I) level, but only able to consistently propel w/c 50' at very slow propulsion speed vs. Target goal of 75'.  Recommendation:  Patient will benefit from ongoing skilled PT services in home health setting to continue to advance safe functional mobility, address ongoing impairments in decreased functional endurance, decreased balance, decreased strength, decreased postural control and minimize fall risk.  Equipment: 30" slide board  Reasons for discharge: treatment goals met and discharge from hospital  Patient/family agrees with progress made and goals achieved: Yes  Skilled Therapeutic Interventions Treatment Session 1:  1:1. Pt received semi-reclined in bed, req min A for t/f sup>sit and squat pivot t/f bed>w/c w/ min cueing for seq overall. Focus this session on initiation of d/c assessment, see detailed objective information below, as well as meeting w/ Fleet Contras, ATP specialist regarding custom w/c trial in home environment. Pt sitting in w/c at end of session w/ all needs in reach.   Treatment Session 2:  1:1. Pt received sitting in w/c,  ready for therapy. Focus this session on family training w/ pt's husband. Pt able to briefly propel w/c at start of tx session x50' w/ B UE at very slow speed but requesting husband to take over 2/2 fatigue. Pt and husband able to demonstrate safe performance of t/f sup<>sit on elevated bed w/ use of step stool, squat pivot t/f bed<>w/c<>recliner, sit<>stand from simulated BSC w/ chair in front for B UE and husband managing pants, slide board t/f w/c<>car w/ pt req min A overall. Pt cueing husband to wait for her to ask for help vs. Jumping in too soon. Therapist providing intermittent cues for improved safety and seq throughout tx session. Pt and husband also verbally educated regarding process of tiral/receiving customized w/c and goals/benefits of HH PT. Pt and husband verbalized understanding of all education completed this session. Pt sitting in w/c at end of session w/ all needs in reach.   PT Discharge Precautions/Restrictions Precautions Precautions: Fall Restrictions Weight Bearing Restrictions: No Vital Signs Therapy Vitals Temp: 98.1 F (36.7 C) Temp src: Oral Pulse Rate: 70 Resp: 18 BP: 110/68 mmHg Patient Position, if appropriate: Lying Oxygen Therapy SpO2: 97 % O2 Device: None (Room air) Pain Pain Assessment Pain Assessment: No/denies pain Vision/Perception  Vision - History Baseline Vision: Wears glasses only for reading Perception Perception: Within Functional Limits Praxis Praxis: Intact  Cognition Overall Cognitive Status: Within Functional Limits for tasks assessed Arousal/Alertness: Awake/alert Orientation Level: Oriented X4 Attention: Selective;Alternating Selective Attention: Appears intact Alternating Attention: Appears intact Memory: Appears intact Awareness: Appears intact Problem Solving: Appears intact Safety/Judgment: Appears intact Sensation Sensation Light Touch: Appears Intact Proprioception: Appears Intact Coordination Gross Motor  Movements are Fluid and Coordinated: No Motor  Motor Motor: Abnormal  postural alignment and control Motor - Discharge Observations: Improved strength overall, R LE cnotinues to remain weaker than L LE  Mobility Bed Mobility Bed Mobility: Supine to Sit;Sit to Supine;Sitting - Scoot to Edge of Bed Supine to Sit: 4: Min assist Supine to Sit Details: Verbal cues for sequencing Sitting - Scoot to Edge of Bed: 4: Min assist Sitting - Scoot to [place] of Bed Details: Verbal cues for sequencing Sit to Supine: 4: Min assist Sit to Supine - Details: Verbal cues for sequencing Transfers Transfers: Yes Sit to Stand: 4: Min Designer, television/film set Transfers: 4: Min assist Lateral/Scoot Transfers: 4: Min assist;With Librarian, academic Details: Verbal cues for sequencing Lateral/Scoot Transfer Details (indicate cue type and reason): verbal cues to attend to positioning of B LE Locomotion  Ambulation Ambulation: No (Pt non-ambulatory PTA) Stairs / Additional Locomotion Stairs: No (Pt did not negotiate stairs PTA) Wheelchair Mobility Wheelchair Mobility: Yes Wheelchair Assistance: 6: Modified independent (Device/Increase time) Occupational hygienist: Both upper extremities Wheelchair Parts Management: Needs assistance Distance: 50'; however, very slow propulsion speed Trunk/Postural Assessment  Cervical Assessment Cervical Assessment: Exceptions to [facility] Cervical Strength Overall Cervical Strength Comments: forward head, able to sustain upright head positioning w/ back support, w/out back support only able to sustain for approx  Thoracic Assessment Thoracic Assessment: Exceptions to [facility] Thoracic AROM Thoracic Flexion: Pt demonstrating significant kyphontic posture w/ scoliosis, L lean Thoracic Strength Overall Thoracic Strength Comments: Able to maintain upright posure w/ B UE support  Lumbar Assessment Lumbar Assessment: Exceptions to [facility] Lumbar AROM Overall Lumbar AROM  Comments: Pt demonstrating posterior pelvic tilt Lumbar Strength Overall Lumbar Strength Comments: Unable to sustain corrected positioning Postural Control Postural Control: Deficits on evaluation Head Control: poor, unable to sustain >5-10 seconds in unsupported sitting Trunk Control: poor, req B UE support to maintain upright posture continues to fatigue quickly Postural Limitations: premorbid deficits scoliosis and sarcoidosis   Balance Balance Balance Assessed: Yes Static Sitting Balance Static Sitting - Balance Support: Bilateral upper extremity supported;Feet supported Static Sitting - Level of Assistance: 5: Stand by assistance Dynamic Sitting Balance Dynamic Sitting - Balance Support: Left upper extremity supported;Feet supported;Right upper extremity supported;Bilateral upper extremity supported Dynamic Sitting - Level of Assistance: 5: Stand by assistance;4: Min assist Dynamic Sitting - Balance Activities: Lateral lean/weight shifting;Forward lean/weight shifting;Reaching for objects;Trunk control activities Static Standing Balance Static Standing - Balance Support: Bilateral upper extremity supported, During functional activity Static Standing - Level of Assistance: 4: Min assist Dynamic Standing Balance Dynamic Standing - Balance Support: Bilateral upper extremity supported, During functional activity Dynamic Standing - Level of Assistance: 4: Min assist; 3. Mod assist Dynamic Standing - Balance Activities: Forward lean/weight shifting;Lateral lean/weight shifting Dynamic Standing - Comments: during transfers Extremity Assessment  RUE Assessment RUE Assessment: Within Functional Limits LUE Assessment LUE Assessment: Within Functional Limits RLE Assessment RLE Assessment: Exceptions to [facility] RLE Strength Right Hip Flexion: 2+/5 Right Knee Flexion: 3/5 Right Knee Extension: 3-/5 Right Ankle Dorsiflexion: 3+/5 Right Ankle Plantar Flexion: 3+/5 LLE Assessment LLE  Assessment: Exceptions to [facility] LLE Strength Left Hip Flexion: 3+/5 Left Knee Flexion: 3+/5 Left Knee Extension: 4/5 Left Ankle Dorsiflexion: 3+/5 Left Ankle Plantar Flexion: 4/5  See FIM for current functional status  Denzil Hughes 07/17/2013, 4:16 PM

## 2013-07-17 NOTE — Progress Notes (Signed)
Social Work Discharge Note Discharge Note  The overall goal for the admission was met for:   Discharge location: Yes-HOME WITH HUSBAND WHO CAN PROVIDE MINIMAL ASSIST  Length of Stay: Yes-18 DAYS  Discharge activity level: Yes-MOD/I -SUPERVISION LEVEL  Home/community participation: Yes  Services provided included: MD, RD, PT, OT, RN, Pharmacy and SW  Financial Services: Medicare and Private Insurance: [employer] [place]  Follow-up services arranged: Home Health: [facility]-PT,OT,RN, DME: [facility]-DROP-ARM BSC & 30 TRANSFER BOARD and Patient/Family has no preference for HH/DME agencies  Comments (or additional information):  Patient/Family verbalized understanding of follow-up arrangements: Yes  Individual responsible for coordination of the follow-up plan: HUSBAND  Confirmed correct DME delivered: Lucy Chris 07/17/2013    Lucy Chris

## 2013-07-17 NOTE — Progress Notes (Signed)
Occupational Therapy Note  Patient Details  Name: Elizabeth Drake MRN: [id number] Date of Birth: 08-Jul-1941 Today's Date: 07/17/2013  Time: 1400-1430 Pt denied pain initially but c/o back pain at end of session; 6/10; RN aware and repositioned Individual Therapy  Pt resting in w/c with husband at side upon arrival.  PT from pervious session stated that she had practiced [facility] transfers and toileting during earlier session and requested that patient practice car transfers using sliding board.  Pt stated that she was getting tired but thought she had enough energy to practice one more time.  Pt and husband required min verbal cues for w/c positioning prior to transferring into car.  Pt's husband has tendency to assist patient when not needed and provide too much assistance.  Recommended to husband to allow patient to do as much as she can and wait for her to ask for assistance.  Recommended to husband that he allow patient to direct positioning of w/c and board prior to transfer.  Pt performed squat pivot transfer back to bed upon return to room.  Husband provided appropriate level of assistance for transfer and positioning in bed.  Pt remained in bed, bed alarm on, and husband present.   Lavone Neri [facility] 07/17/2013, 2:31 PM

## 2013-07-17 NOTE — Progress Notes (Signed)
Social Work Patient ID: Elizabeth Drake, female   DOB: 06/09/1941, 72 y.o.   MRN: [id number]  CSW met with pt and pt's husband after family education.  Pt's husband feels like he can provide the care pt will need at home.  He has already been making home modifications.  CSW spoke with Rayfield Citizen, PT, and she stated that pt's husband did well with family education.  Pt's husband alluded to the PT that he was providing pt with assistance prior to admission.  Pt will receive drop arm commode and 30" transfer board prior to d/c tomorrow.  No other needs identified at this time.

## 2013-07-18 DIAGNOSIS — D869 Sarcoidosis, unspecified: Secondary | ICD-10-CM

## 2013-07-18 DIAGNOSIS — R5381 Other malaise: Secondary | ICD-10-CM

## 2013-07-18 DIAGNOSIS — G8220 Paraplegia, unspecified: Secondary | ICD-10-CM

## 2013-07-18 LAB — GLUCOSE, CAPILLARY
Glucose-Capillary: 65 mg/dL — ABNORMAL LOW (ref 70–99)
Glucose-Capillary: 112 mg/dL — ABNORMAL HIGH (ref 70–99)

## 2013-07-18 NOTE — Progress Notes (Signed)
Subjective/Complaints:  No pain c/os No SOB, no bowel issues Urinary dribbling (incontinent)--unable to empty when she attempt to void  Review of Systems - otherwise negative  Objective: Vital Signs: Blood pressure 126/72, pulse 69, temperature 97.8 F (36.6 C), temperature source Oral, resp. rate 18, height 5\' 6"  (1.676 m), weight 50.8 kg (111 lb 15.9 oz), SpO2 97.00%. No results found. Results for orders placed during the hospital encounter of 06/30/13 (from the past 72 hour(s))  GLUCOSE, CAPILLARY     Status: Abnormal   Collection Time    07/15/13 11:29 AM      Result Value Range   Glucose-Capillary 141 (*) 70 - 99 mg/dL   Comment 1 Notify RN    GLUCOSE, CAPILLARY     Status: Abnormal   Collection Time    07/15/13  4:16 PM      Result Value Range   Glucose-Capillary 193 (*) 70 - 99 mg/dL   Comment 1 Notify RN    GLUCOSE, CAPILLARY     Status: Abnormal   Collection Time    07/15/13  8:42 PM      Result Value Range   Glucose-Capillary 149 (*) 70 - 99 mg/dL  GLUCOSE, CAPILLARY     Status: None   Collection Time    07/16/13  7:19 AM      Result Value Range   Glucose-Capillary 95  70 - 99 mg/dL   Comment 1 Notify RN    GLUCOSE, CAPILLARY     Status: Abnormal   Collection Time    07/16/13 11:37 AM      Result Value Range   Glucose-Capillary 162 (*) 70 - 99 mg/dL   Comment 1 Notify RN    GLUCOSE, CAPILLARY     Status: Abnormal   Collection Time    07/16/13  4:19 PM      Result Value Range   Glucose-Capillary 232 (*) 70 - 99 mg/dL   Comment 1 Notify RN    GLUCOSE, CAPILLARY     Status: Abnormal   Collection Time    07/16/13  8:31 PM      Result Value Range   Glucose-Capillary 198 (*) 70 - 99 mg/dL  GLUCOSE, CAPILLARY     Status: None   Collection Time    07/17/13  7:38 AM      Result Value Range   Glucose-Capillary 78  70 - 99 mg/dL   Comment 1 Notify RN    GLUCOSE, CAPILLARY     Status: Abnormal   Collection Time    07/17/13 11:01 AM      Result Value Range   Glucose-Capillary 204 (*) 70 - 99 mg/dL   Comment 1 Notify RN    GLUCOSE, CAPILLARY     Status: Abnormal   Collection Time    07/17/13  4:31 PM      Result Value Range   Glucose-Capillary 195 (*) 70 - 99 mg/dL  GLUCOSE, CAPILLARY     Status: Abnormal   Collection Time    07/17/13  8:55 PM      Result Value Range   Glucose-Capillary 139 (*) 70 - 99 mg/dL   Comment 1 Notify RN       HEENT: normal Cardio: RRR and No murmur Resp: CTA B/L and Unlabored GI: BS positive and Nondistended Extremity:  Pulses positive and No Edema Skin:   Intact Neuro: Alert/Oriented, Cranial Nerve II-XII normal, Normal Sensory and Abnormal Motor 4+/5 bilateral deltoid, bicep, tricep, grip.   2+  in the right hip flexor 3-   knee extensor 2 + right ankle dorsiflexor plantar flexor  3  left hip flexor knee extensor ankle dorsiflexor plantar flexor Musc/Skel:  Other Atrophy in the right leg General no acute distress   Assessment/Plan: 1. Functional deficits secondary to  deconditioning after pulmonary embolism and DVT in a patient with chronic paraparesis   Stable for D/C today F/u PCP in 1-2 weeks F/u PM&R 3 weeks See D/C summary See D/C instructions   FIM: FIM - Bathing Bathing Steps Patient Completed: Chest;Right Arm;Left Arm;Abdomen;Front perineal area;Right lower leg (including foot);Left lower leg (including foot);Buttocks;Right upper leg;Left upper leg Bathing: 4: Steadying assist  FIM - Upper Body Dressing/Undressing Upper body dressing/undressing steps patient completed: Put head through opening of pull over shirt/dress;Thread/unthread left sleeve of pullover shirt/dress;Thread/unthread right sleeve of pullover shirt/dresss;Thread/unthread right bra strap;Thread/unthread left bra strap;Pull shirt over trunk;Hook/unhook bra Upper body dressing/undressing: 7: Complete Independence: No helper FIM - Lower Body Dressing/Undressing Lower body dressing/undressing steps patient completed:  Thread/unthread right underwear leg;Thread/unthread left underwear leg;Pull underwear up/down;Thread/unthread right pants leg;Thread/unthread left pants leg;Pull pants up/down;Don/Doff right sock;Don/Doff left sock;Don/Doff right shoe;Don/Doff left shoe;Fasten/unfasten right shoe;Fasten/unfasten left shoe Lower body dressing/undressing: 4: Steadying Assist  FIM - Toileting Toileting steps completed by patient: Adjust clothing prior to toileting;Performs perineal hygiene;Adjust clothing after toileting Toileting: 4: Steadying assist  FIM - Archivist Transfers: 4-To toilet/BSC: Min A (steadying Pt. > 75%);4-From toilet/BSC: Min A (steadying Pt. > 75%)  FIM - Bed/Chair Transfer Bed/Chair Transfer Assistive Devices: Arm rests;Sliding board Bed/Chair Transfer: 4: Bed > Chair or W/C: Min A (steadying Pt. > 75%);4: Chair or W/C > Bed: Min A (steadying Pt. > 75%);4: Supine > Sit: Min A (steadying Pt. > 75%/lift 1 leg);4: Sit > Supine: Min A (steadying pt. > 75%/lift 1 leg)  FIM - Locomotion: Wheelchair Distance: 50'; however, very slow propulsion speed Locomotion: Wheelchair: 5: Travels 50 - 149 ft, turns around, maneuvers to table, bed or toilet, negotiates 3% grade: modified independent FIM - Locomotion: Ambulation Ambulation/Gait Assistance: Not tested (comment) Locomotion: Ambulation: 0: Activity did not occur (Pt non-ambulatory PTA)  Comprehension Comprehension Mode: Auditory Comprehension: 6-Follows complex conversation/direction: With extra time/assistive device  Expression Expression Mode: Verbal Expression: 6-Expresses complex ideas: With extra time/assistive device  Social Interaction Social Interaction: 6-Interacts appropriately with others with medication or extra time (anti-anxiety, antidepressant).  Problem Solving Problem Solving: 6-Solves complex problems: With extra time  Memory Memory: 5-Requires cues to use assistive device  Medical Problem List and Plan   1. 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 DVT Prophylaxis/Anticoagulation: Xarelto. Monitor for any bleeding episodes  3. Pain Management: Hydrocodone as needed. Monitor with increased mobility  4. Neuropsych: This patient is capable of making decisions on her own behalf.   -needs regular positive reinforcement 5. Sarcoidosis. Continue chronic prednisone. Patient with pronounced bilateral lower extremity weakness  6. Diabetes mellitus with peripheral neuropathy. uncontrolled Hemoglobin A1c 7.4. Amaryl increased to 8 mg daily. Checking blood sugars a.c. and at bedtime , metformin changed to 500mg  qd--increased to 1000mg  qam--(pm dose held).  AM CBG low, discussed need for hs snack at home, pt plans to have peanut butter and crackers 7. Hypertension. Lopressor 25 mg twice a day. Monitor with increased mobility  8. Escherichia coli urinary tract infection.  + again. Added suprax 9. Urinary retention continue Flomax.    -replace foley today   -stop urecholine  -rx UTI 10. Constipation: moving  bowels qod essentially LOS (Days) 18 A FACE TO FACE EVALUATION WAS PERFORMED  KIRSTEINS,ANDREW E 07/18/2013, 8:06 AM

## 2013-07-18 NOTE — Plan of Care (Signed)
Problem: RH BOWEL ELIMINATION Goal: RH STG MANAGE BOWEL WITH ASSISTANCE STG Manage Bowel with mod Assistance.  Outcome: Not Met (add Reason) Patient continent of bowel but refused laxative last few days. Longer that 2 days since last BM  Problem: RH BLADDER ELIMINATION Goal: RH STG MANAGE BLADDER WITH ASSISTANCE STG Manage Bladder With mod Assistance  Outcome: Not Met (add Reason) Patient continues to retain and require PRN cath. Catheter placed prior to [facility]. To follow up outpatient Goal: RH STG MANAGE BLADDER WITH MEDICATION WITH ASSISTANCE STG Manage Bladder With Medication With mod Assistance.  Outcome: Not Met (add Reason) Foley placed due to urinary retention

## 2013-07-18 NOTE — Significant Event (Signed)
Hypoglycemic Event  CBG: 65  Treatment: 15 GM carbohydrate snack- breakfast  Symptoms: None  Follow-up CBG: Time:0856 CBG Result:112  Possible Reasons for Event: Unknown  Comments/MD notified:MD Kirsteins notified. No new orders received    Yanelie Abraha, Sylvie Farrier  Remember to initiate Hypoglycemia Order Set & complete

## 2013-07-18 NOTE — Plan of Care (Signed)
Problem: RH BOWEL ELIMINATION Goal: RH STG MANAGE BOWEL WITH ASSISTANCE STG Manage Bowel with mod Assistance.  Outcome: Not Progressing LBM 07-15-13 refused stool softner

## 2013-07-18 NOTE — Progress Notes (Signed)
Urethral catheter 14 french placed prior to discharge per order. Discharge instructions and follow up appointments rreviewed with patient and family. No further questions noted. Patient discharged with sliding board and drop arm commode brought in. Patient discharge instructions given via PA Friday to patient and family. Patient discharged home about 1100 with all belongings. Patient vitals stable.

## 2013-07-20 ENCOUNTER — Telehealth: Payer: Self-pay | Admitting: *Deleted

## 2013-07-20 ENCOUNTER — Encounter (HOSPITAL_COMMUNITY): Payer: Self-pay | Admitting: Emergency Medicine

## 2013-07-20 ENCOUNTER — Emergency Department (HOSPITAL_COMMUNITY)
Admission: EM | Admit: 2013-07-20 | Discharge: 2013-07-20 | Disposition: A | Discharge status: Home / Self Care | Payer: Medicare Other | Attending: Emergency Medicine | Admitting: Emergency Medicine

## 2013-07-20 ENCOUNTER — Other Ambulatory Visit (INDEPENDENT_AMBULATORY_CARE_PROVIDER_SITE_OTHER): Payer: Medicare Other

## 2013-07-20 DIAGNOSIS — N390 Urinary tract infection, site not specified: Secondary | ICD-10-CM

## 2013-07-20 DIAGNOSIS — Z7901 Long term (current) use of anticoagulants: Secondary | ICD-10-CM | POA: Insufficient documentation

## 2013-07-20 DIAGNOSIS — Z86718 Personal history of other venous thrombosis and embolism: Secondary | ICD-10-CM | POA: Insufficient documentation

## 2013-07-20 DIAGNOSIS — D869 Sarcoidosis, unspecified: Secondary | ICD-10-CM | POA: Diagnosis not present

## 2013-07-20 DIAGNOSIS — R609 Edema, unspecified: Secondary | ICD-10-CM | POA: Diagnosis not present

## 2013-07-20 DIAGNOSIS — Z466 Encounter for fitting and adjustment of urinary device: Secondary | ICD-10-CM | POA: Insufficient documentation

## 2013-07-20 DIAGNOSIS — R319 Hematuria, unspecified: Secondary | ICD-10-CM

## 2013-07-20 DIAGNOSIS — M545 Low back pain, unspecified: Secondary | ICD-10-CM | POA: Insufficient documentation

## 2013-07-20 DIAGNOSIS — G8929 Other chronic pain: Secondary | ICD-10-CM | POA: Insufficient documentation

## 2013-07-20 DIAGNOSIS — Z8719 Personal history of other diseases of the digestive system: Secondary | ICD-10-CM | POA: Insufficient documentation

## 2013-07-20 DIAGNOSIS — Z8619 Personal history of other infectious and parasitic diseases: Secondary | ICD-10-CM | POA: Insufficient documentation

## 2013-07-20 DIAGNOSIS — I10 Essential (primary) hypertension: Secondary | ICD-10-CM | POA: Diagnosis not present

## 2013-07-20 DIAGNOSIS — Z79899 Other long term (current) drug therapy: Secondary | ICD-10-CM | POA: Diagnosis not present

## 2013-07-20 DIAGNOSIS — E1149 Type 2 diabetes mellitus with other diabetic neurological complication: Secondary | ICD-10-CM | POA: Diagnosis not present

## 2013-07-20 DIAGNOSIS — M79609 Pain in unspecified limb: Secondary | ICD-10-CM | POA: Insufficient documentation

## 2013-07-20 DIAGNOSIS — Z888 Allergy status to other drugs, medicaments and biological substances status: Secondary | ICD-10-CM | POA: Diagnosis not present

## 2013-07-20 DIAGNOSIS — Z87891 Personal history of nicotine dependence: Secondary | ICD-10-CM | POA: Insufficient documentation

## 2013-07-20 DIAGNOSIS — K5900 Constipation, unspecified: Secondary | ICD-10-CM | POA: Insufficient documentation

## 2013-07-20 DIAGNOSIS — M810 Age-related osteoporosis without current pathological fracture: Secondary | ICD-10-CM | POA: Insufficient documentation

## 2013-07-20 DIAGNOSIS — Z86711 Personal history of pulmonary embolism: Secondary | ICD-10-CM | POA: Diagnosis not present

## 2013-07-20 DIAGNOSIS — E1129 Type 2 diabetes mellitus with other diabetic kidney complication: Secondary | ICD-10-CM | POA: Diagnosis not present

## 2013-07-20 DIAGNOSIS — G8220 Paraplegia, unspecified: Secondary | ICD-10-CM | POA: Diagnosis not present

## 2013-07-20 DIAGNOSIS — F329 Major depressive disorder, single episode, unspecified: Secondary | ICD-10-CM | POA: Diagnosis not present

## 2013-07-20 DIAGNOSIS — Z885 Allergy status to narcotic agent status: Secondary | ICD-10-CM | POA: Insufficient documentation

## 2013-07-20 DIAGNOSIS — IMO0001 Reserved for inherently not codable concepts without codable children: Secondary | ICD-10-CM | POA: Diagnosis not present

## 2013-07-20 DIAGNOSIS — I82409 Acute embolism and thrombosis of unspecified deep veins of unspecified lower extremity: Secondary | ICD-10-CM | POA: Diagnosis not present

## 2013-07-20 DIAGNOSIS — R339 Retention of urine, unspecified: Secondary | ICD-10-CM | POA: Diagnosis not present

## 2013-07-20 DIAGNOSIS — R110 Nausea: Secondary | ICD-10-CM | POA: Insufficient documentation

## 2013-07-20 DIAGNOSIS — Z87440 Personal history of urinary (tract) infections: Secondary | ICD-10-CM | POA: Diagnosis not present

## 2013-07-20 DIAGNOSIS — I2699 Other pulmonary embolism without acute cor pulmonale: Secondary | ICD-10-CM | POA: Diagnosis not present

## 2013-07-20 DIAGNOSIS — E1142 Type 2 diabetes mellitus with diabetic polyneuropathy: Secondary | ICD-10-CM | POA: Diagnosis not present

## 2013-07-20 HISTORY — DX: Other pulmonary embolism without acute cor pulmonale: I26.99

## 2013-07-20 HISTORY — DX: Acute embolism and thrombosis of unspecified deep veins of unspecified lower extremity: I82.409

## 2013-07-20 LAB — COMPREHENSIVE METABOLIC PANEL
AST: 18 U/L (ref 0–37)
Albumin: 3.3 g/dL — ABNORMAL LOW (ref 3.5–5.2)
Alkaline Phosphatase: 50 U/L (ref 39–117)
BUN: 17 mg/dL (ref 6–23)
Chloride: 102 mEq/L (ref 96–112)
Creatinine, Ser: 0.47 mg/dL — ABNORMAL LOW (ref 0.50–1.10)
Potassium: 4.2 mEq/L (ref 3.5–5.1)
Sodium: 139 mEq/L (ref 135–145)
Total Protein: 6.7 g/dL (ref 6.0–8.3)

## 2013-07-20 LAB — POCT URINALYSIS DIPSTICK

## 2013-07-20 LAB — CBC WITH DIFFERENTIAL/PLATELET
Basophils Absolute: 0 10*3/uL (ref 0.0–0.1)
Basophils Relative: 0 % (ref 0–1)
Eosinophils Absolute: 0 10*3/uL (ref 0.0–0.7)
Eosinophils Relative: 0 % (ref 0–5)
Hemoglobin: 13.1 g/dL (ref 12.0–15.0)
MCH: 32.8 pg (ref 26.0–34.0)
MCHC: 33 g/dL (ref 30.0–36.0)
MCV: 99.5 fL (ref 78.0–100.0)
Monocytes Absolute: 0.5 10*3/uL (ref 0.1–1.0)
Monocytes Relative: 5 % (ref 3–12)
Neutro Abs: 7.2 10*3/uL (ref 1.7–7.7)
Neutrophils Relative %: 82 % — ABNORMAL HIGH (ref 43–77)
RDW: 12.5 % (ref 11.5–15.5)

## 2013-07-20 LAB — URINALYSIS, ROUTINE W REFLEX MICROSCOPIC
Glucose, UA: >1000 mg/dL — AB
Ketones, ur: 15 mg/dL — AB
Protein, ur: 100 mg/dL — AB

## 2013-07-20 LAB — POCT UA - MICROSCOPIC ONLY
Bacteria, U Microscopic: NEGATIVE
Casts, Ur, LPF, POC: NEGATIVE
Crystals, Ur, HPF, POC: NEGATIVE
Mucus, UA: NEGATIVE

## 2013-07-20 LAB — URINE MICROSCOPIC-ADD ON

## 2013-07-20 MED ORDER — CEPHALEXIN 500 MG PO CAPS
500.0000 mg | ORAL_CAPSULE | Freq: Two times a day (BID) | ORAL | Status: DC
Start: 2013-07-20 — End: 2013-09-17

## 2013-07-20 MED ORDER — DEXTROSE 5 % IV SOLN
1.0000 g | Freq: Once | INTRAVENOUS | Status: AC
  Administered 2013-07-20: 1 g via INTRAVENOUS
  Filled 2013-07-20: qty 10

## 2013-07-20 NOTE — ED Provider Notes (Signed)
CSN: [id number]     Arrival date & time 07/20/13  1442 History   First MD Initiated Contact with Patient 07/20/13 1514     Chief Complaint  Patient presents with  . Hematuria    HPI  Elizabeth Drake is a 72 y.o. female with a PMH of DVT, PE, osteoporosis, DM, hiatal hernia, and sarcoidosis who presents to the ED for evaluation of hematuria.  History was provided by the patient.  Patient was recently admitted on 06/30/13 for deconditioning secondary to DVT & PE and discharged on 07/18/13 home.  Patient was found to have a urinary tract infection (E Coli) and was treated with Ceftin, which cleared her UTI.  She was not sent home on antibiotics.  She was sent home with a urinary catheter, which was placed on 07/18/13 for urinary retention.  Patient has a hx of overactive bladder and has seen Dr. Annabell Howells with urology in the past.  She has never had a urinary catheter.  She states she is still "trying to get used to it" and has a follow-up appointment with Dr. Annabell Howells on 08/12/12.  She was seen by her home health RN today for the first time, who was concerned about the dark colored urine.  Patient states that her urine has been a little darker for the past 24 hours.  She also has some mild "burning" in her bladder, however, denies any abdominal/pelvic pain.  She denies any trauma/injuries to the catheter.  She is currently on xarelto with no missed doses.  She has had mild nausea from "not eating anything today" but otherwise has had good appetite/activity/intake.  She denies any fevers, chills, chest pain, SOB, dyspnea, or emesis.  She has not had a bowel movement for the past 24-48 hours and has been taking stool softeners.  She is taking Vicodin for chronic low back pain and leg pain with no acute changes.      Past Medical History  Diagnosis Date  . Sarcoidosis   . Hiatal hernia   . Diabetes mellitus without complication   . Osteoporosis   . DVT (deep venous thrombosis)   . Pulmonary emboli    Past  Surgical History  Procedure Laterality Date  . Abdominal hysterectomy    . Joint replacement      toe  . Lung lobectomy Left     Lower   Family History  Problem Relation Age of Onset  . Heart disease Mother   . Hypertension Mother   . Cancer Father     pancratic  . Cancer - Other Sister    History  Substance Use Topics  . Smoking status: Former Smoker    Types: Cigarettes    Quit date: 12/31/2000  . Smokeless tobacco: Not on file  . Alcohol Use: No   OB History   Grav Para Term Preterm Abortions TAB SAB Ect Mult Living                 Review of Systems  Constitutional: Negative for fever, chills, diaphoresis, activity change, appetite change and fatigue.  Eyes: Negative for visual disturbance.  Respiratory: Negative for cough, shortness of breath and wheezing.   Cardiovascular: Positive for leg swelling (at baseline). Negative for chest pain.  Gastrointestinal: Positive for nausea (intermittent) and constipation. Negative for vomiting, abdominal pain and diarrhea.  Genitourinary: Positive for dysuria, hematuria and difficulty urinating (urinary catheter in place due to urinary retention). Negative for flank pain, decreased urine volume, vaginal bleeding, vaginal pain and  pelvic pain.  Musculoskeletal: Positive for arthralgias (chronic), back pain (chronic) and myalgias (chronic).  Skin: Negative for wound.  Neurological: Negative for dizziness, weakness, light-headedness and headaches.    Allergies  Advil; Azo; Indocin; Loratadine; and Percocet  Home Medications   Current Outpatient Rx  Name  Route  Sig  Dispense  Refill  . calcium-vitamin D (OSCAL 500/200 D-3) 500-200 MG-UNIT per tablet   Oral   Take 1 tablet by mouth daily with breakfast.         . glimepiride (AMARYL) 4 MG tablet   Oral   Take 2 tablets (8 mg total) by mouth daily with breakfast.   30 tablet   1   . HYDROcodone-acetaminophen (NORCO/VICODIN) 5-325 MG per tablet   Oral   Take 1-2 tablets  by mouth every 4 (four) hours as needed for moderate pain.   60 tablet   0   . metFORMIN (GLUCOPHAGE) 1000 MG tablet   Oral   Take 1 tablet (1,000 mg total) by mouth daily with breakfast.   30 tablet   1   . metoprolol tartrate (LOPRESSOR) 25 MG tablet   Oral   Take 1 tablet (25 mg total) by mouth 2 (two) times daily.   60 tablet   1   . Multiple Vitamin (MULTIVITAMIN WITH MINERALS) TABS tablet   Oral   Take 1 tablet by mouth daily.         . predniSONE (DELTASONE) 5 MG tablet   Oral   Take 2 tablets (10 mg total) by mouth daily with breakfast.   30 tablet   1   . Rivaroxaban (XARELTO) 20 MG TABS tablet   Oral   Take 1 tablet (20 mg total) by mouth daily with supper.   30 tablet   1   . tamsulosin (FLOMAX) 0.4 MG CAPS capsule   Oral   Take 1 capsule (0.4 mg total) by mouth daily.   30 capsule   1    BP 119/75  Pulse 95  Temp(Src) 98.1 F (36.7 C)  Resp 16  SpO2 95%  Filed Vitals:   07/20/13 1645 07/20/13 1730 07/20/13 1745 07/20/13 1800  BP: 112/94 131/49 134/64 136/71  Pulse: 91 84 97 82  Temp:    98.1 F (36.7 C)  TempSrc:    Oral  Resp:    18  SpO2: 96% 97% 99% 100%    Physical Exam  Nursing note and vitals reviewed. Constitutional: She is oriented to person, place, and time. She appears well-developed and well-nourished. No distress.  HENT:  Head: Normocephalic and atraumatic.  Right Ear: External ear normal.  Left Ear: External ear normal.  Nose: Nose normal.  Mouth/Throat: Oropharynx is clear and moist.  Eyes: Conjunctivae are normal. Right eye exhibits no discharge. Left eye exhibits no discharge.  Neck: Normal range of motion. Neck supple.  Cardiovascular: Normal rate, regular rhythm, normal heart sounds and intact distal pulses.  Exam reveals no gallop and no friction rub.   No murmur heard. Dorsalis pedis pulses present and equal bilaterally  Pulmonary/Chest: Effort normal and breath sounds normal. No respiratory distress. She has no  wheezes. She has no rales. She exhibits no tenderness.  Abdominal: Soft. She exhibits no distension and no mass. There is no tenderness. There is no rebound and no guarding.  Genitourinary:  Urethral urinary catheter in with no active bleeding/hemorrhage. Small 5 mm circular mass of tissue protruding 2mm  from the urethral canal, which is erythematous and bleeds with  palpated.  Urine is pink-tinged in the urinary catheter bag  Musculoskeletal: Normal range of motion. She exhibits edema. She exhibits no tenderness.  Trace pitting edema bilaterally.  Neurological: She is alert and oriented to person, place, and time.  Skin: She is not diaphoretic.    ED Course  Procedures (including critical care time) Labs Review Labs Reviewed  CBC WITH DIFFERENTIAL  COMPREHENSIVE METABOLIC PANEL  URINALYSIS, ROUTINE W REFLEX MICROSCOPIC   Imaging Review No results found.  EKG Interpretation   None      Results for orders placed during the hospital encounter of 07/20/13  URINE CULTURE      Result Value Range   Specimen Description URINE, CATHETERIZED     Special Requests NONE     Culture  Setup Time       Value: 07/20/2013 16:15     Performed at [facility] Count       Value: 50,000 COLONIES/ML     Performed at [facility]   Culture       Value: YEAST     Performed at [facility]   Report Status 07/21/2013 FINAL    CBC WITH DIFFERENTIAL      Result Value Range   WBC 8.8  4.0 - 10.5 K/uL   RBC 3.99  3.87 - 5.11 MIL/uL   Hemoglobin 13.1  12.0 - 15.0 g/dL   HCT 16.1  09.6 - 04.5 %   MCV 99.5  78.0 - 100.0 fL   MCH 32.8  26.0 - 34.0 pg   MCHC 33.0  30.0 - 36.0 g/dL   RDW 40.9  81.1 - 91.4 %   Platelets 235  150 - 400 K/uL   Neutrophils Relative % 82 (*) 43 - 77 %   Neutro Abs 7.2  1.7 - 7.7 K/uL   Lymphocytes Relative 13  12 - 46 %   Lymphs Abs 1.1  0.7 - 4.0 K/uL   Monocytes Relative 5  3 - 12 %   Monocytes Absolute 0.5  0.1 - 1.0 K/uL    Eosinophils Relative 0  0 - 5 %   Eosinophils Absolute 0.0  0.0 - 0.7 K/uL   Basophils Relative 0  0 - 1 %   Basophils Absolute 0.0  0.0 - 0.1 K/uL  COMPREHENSIVE METABOLIC PANEL      Result Value Range   Sodium 139  135 - 145 mEq/L   Potassium 4.2  3.5 - 5.1 mEq/L   Chloride 102  96 - 112 mEq/L   CO2 26  19 - 32 mEq/L   Glucose, Bld 261 (*) 70 - 99 mg/dL   BUN 17  6 - 23 mg/dL   Creatinine, Ser 7.82 (*) 0.50 - 1.10 mg/dL   Calcium 9.8  8.4 - 95.6 mg/dL   Total Protein 6.7  6.0 - 8.3 g/dL   Albumin 3.3 (*) 3.5 - 5.2 g/dL   AST 18  0 - 37 U/L   ALT 17  0 - 35 U/L   Alkaline Phosphatase 50  39 - 117 U/L   Total Bilirubin 0.3  0.3 - 1.2 mg/dL   GFR calc non Af Amer >90  >90 mL/min   GFR calc Af Amer >90  >90 mL/min  URINALYSIS, ROUTINE W REFLEX MICROSCOPIC      Result Value Range   Color, Urine RED (*) YELLOW   APPearance TURBID (*) CLEAR   Specific Gravity, Urine 1.030  1.005 - 1.030  pH 5.0  5.0 - 8.0   Glucose, UA >1000 (*) NEGATIVE mg/dL   Hgb urine dipstick LARGE (*) NEGATIVE   Bilirubin Urine MODERATE (*) NEGATIVE   Ketones, ur 15 (*) NEGATIVE mg/dL   Protein, ur 161 (*) NEGATIVE mg/dL   Urobilinogen, UA 0.2  0.0 - 1.0 mg/dL   Nitrite NEGATIVE  NEGATIVE   Leukocytes, UA MODERATE (*) NEGATIVE  URINE MICROSCOPIC-ADD ON      Result Value Range   Squamous Epithelial / LPF RARE  RARE   WBC, UA 11-20  <3 WBC/hpf   RBC / HPF TOO NUMEROUS TO COUNT  <3 RBC/hpf   Bacteria, UA FEW (*) RARE   Urine-Other MUCOUS PRESENT       MDM   DAJSHA MASSARO is a 72 y.o. female with a PMH of DVT, PE, osteoporosis, DM, hiatal hernia, and sarcoidosis who presents to the ED for evaluation of hematuria.   Rechecks  4:35 PM = Will treat UTI with 1 gram Rocephin and discharge home on Keflex.  Patient doing well with no concerns.     Etiology of hematuria possibly due to a UTI vs urethral irritation from the foley catheter likely exacerbated by Xarelto for DVT & PE.  Patient tx with Rocephin  in the ED and discharged on Keflex.  Patient's last E Coli was sensitive to Keflex and resistant to Bactrim, Cipro, and Levaquin.  Urine sent for culture.  Patient afebrile, without leukocytosis, and non-toxic in appearance.  H&H stable.  She had no complaints of abdominal/pelvic pain.  Patient has follow-up with urology in the next few weeks and continued care from home health.  Return precautions, discharge instructions, and follow-up was discussed with the patient before discharge.   Discharge Medication List as of 07/20/2013  5:56 PM    START taking these medications   Details  cephALEXin (KEFLEX) 500 MG capsule Take 1 capsule (500 mg total) by mouth 2 (two) times daily., Starting 07/20/2013, Until Discontinued, Print         Final impressions: 1. UTI (urinary tract infection)   2. Hematuria      Luiz Iron PA-C   This patient was discussed with Dr. Clayborne Dana, PA-C 07/21/13 1255

## 2013-07-20 NOTE — Telephone Encounter (Signed)
Elizabeth Drake at [facility] called and said pts. Urine was grossly bloody. MMM gave verbal order to get u/a and bring to office to be read. No bacteria showed in urine, so MMM gave order to send pt to ER. I notified pt and she said they would be on their way to [facility].

## 2013-07-20 NOTE — ED Notes (Signed)
Pt dx from hospital on Saturday (12/13) after being tx for DVT and bilateral PE's. PT started on Xarelto. Pt reports that last night her husband noticed her urine being a "little darker than normal." Pt now has dark urine in catheter bag. Denies any pain or complaint at this time. Resting comfortably in bed. Pt called PCP today with concern who informed her to come here.

## 2013-07-20 NOTE — ED Notes (Addendum)
Just released from rehab on sat and has a indewelling cath and last night her husband states that he ua was dark pink and burgandy cloudy pt is on blood thinners for pe and dvt

## 2013-07-20 NOTE — Progress Notes (Signed)
Patient came in for labs only.

## 2013-07-21 DIAGNOSIS — G8220 Paraplegia, unspecified: Secondary | ICD-10-CM | POA: Diagnosis not present

## 2013-07-21 DIAGNOSIS — I82409 Acute embolism and thrombosis of unspecified deep veins of unspecified lower extremity: Secondary | ICD-10-CM | POA: Diagnosis not present

## 2013-07-21 DIAGNOSIS — E1142 Type 2 diabetes mellitus with diabetic polyneuropathy: Secondary | ICD-10-CM | POA: Diagnosis not present

## 2013-07-21 DIAGNOSIS — D869 Sarcoidosis, unspecified: Secondary | ICD-10-CM | POA: Diagnosis not present

## 2013-07-21 DIAGNOSIS — I2699 Other pulmonary embolism without acute cor pulmonale: Secondary | ICD-10-CM | POA: Diagnosis not present

## 2013-07-21 DIAGNOSIS — E1149 Type 2 diabetes mellitus with other diabetic neurological complication: Secondary | ICD-10-CM | POA: Diagnosis not present

## 2013-07-21 LAB — URINE CULTURE: Colony Count: 50000

## 2013-07-21 NOTE — Progress Notes (Signed)
Social Work  Discharge Note  The overall goal for the admission was met for:   Discharge location: Yes - home with husband to provide 24/7 assistance  Length of Stay: Yes - 18 days  Discharge activity level: Yes - minimal assistance w/c level overall  Home/community participation: Yes  Services provided included: MD, RD, PT, OT, RN, TR, Pharmacy and SW  Financial Services: Medicare and Private Insurance: [place] [employer] [place]  Follow-up services arranged: Home Health: RN, PT, OT via [facility], DME: droparm commode via [facility] and Patient/Family has no preference for HH/DME agencies  Comments (or additional information):  Patient/Family verbalized understanding of follow-up arrangements: Yes  Individual responsible for coordination of the follow-up plan: patient  Confirmed correct DME delivered: Vondra Aldredge 07/21/2013    Mlissa Tamayo

## 2013-07-23 DIAGNOSIS — I82409 Acute embolism and thrombosis of unspecified deep veins of unspecified lower extremity: Secondary | ICD-10-CM | POA: Diagnosis not present

## 2013-07-23 DIAGNOSIS — I2699 Other pulmonary embolism without acute cor pulmonale: Secondary | ICD-10-CM | POA: Diagnosis not present

## 2013-07-23 DIAGNOSIS — E1142 Type 2 diabetes mellitus with diabetic polyneuropathy: Secondary | ICD-10-CM | POA: Diagnosis not present

## 2013-07-23 DIAGNOSIS — D869 Sarcoidosis, unspecified: Secondary | ICD-10-CM | POA: Diagnosis not present

## 2013-07-23 DIAGNOSIS — E1149 Type 2 diabetes mellitus with other diabetic neurological complication: Secondary | ICD-10-CM | POA: Diagnosis not present

## 2013-07-23 DIAGNOSIS — G8220 Paraplegia, unspecified: Secondary | ICD-10-CM | POA: Diagnosis not present

## 2013-07-23 NOTE — ED Provider Notes (Signed)
Medical screening examination/treatment/procedure(s) were conducted as a shared visit with non-physician practitioner(s) and myself.  I personally evaluated the patient during the encounter   .Face to face Exam:  General:  A&Ox3 HEENT:  Atraumatic Resp:  Normal effort Abd:  Nondistended Neuro:No focal deficits     Nelia Shi, MD 07/23/13 1342

## 2013-07-24 ENCOUNTER — Encounter: Payer: Self-pay | Admitting: Nurse Practitioner

## 2013-07-24 ENCOUNTER — Ambulatory Visit (INDEPENDENT_AMBULATORY_CARE_PROVIDER_SITE_OTHER): Payer: Medicare Other | Admitting: Nurse Practitioner

## 2013-07-24 VITALS — BP 105/57 | HR 71 | Temp 98.8°F

## 2013-07-24 DIAGNOSIS — N390 Urinary tract infection, site not specified: Secondary | ICD-10-CM

## 2013-07-24 DIAGNOSIS — I82409 Acute embolism and thrombosis of unspecified deep veins of unspecified lower extremity: Secondary | ICD-10-CM | POA: Diagnosis not present

## 2013-07-24 DIAGNOSIS — I2699 Other pulmonary embolism without acute cor pulmonale: Secondary | ICD-10-CM

## 2013-07-24 DIAGNOSIS — Z09 Encounter for follow-up examination after completed treatment for conditions other than malignant neoplasm: Secondary | ICD-10-CM | POA: Diagnosis not present

## 2013-07-24 DIAGNOSIS — I82401 Acute embolism and thrombosis of unspecified deep veins of right lower extremity: Secondary | ICD-10-CM

## 2013-07-24 MED ORDER — CIPROFLOXACIN HCL 500 MG PO TABS: 500.0000 mg | ORAL_TABLET | Freq: Two times a day (BID) | ORAL | Status: DC

## 2013-07-24 NOTE — Patient Instructions (Signed)
Urinary Tract Infection  Urinary tract infections (UTIs) can develop anywhere along your urinary tract. Your urinary tract is your body's drainage system for removing wastes and extra water. Your urinary tract includes two kidneys, two ureters, a bladder, and a urethra. Your kidneys are a pair of bean-shaped organs. Each kidney is about the size of your fist. They are located below your ribs, one on each side of your spine.  CAUSES  Infections are caused by microbes, which are microscopic organisms, including fungi, viruses, and bacteria. These organisms are so small that they can only be seen through a microscope. Bacteria are the microbes that most commonly cause UTIs.  SYMPTOMS   Symptoms of UTIs may vary by age and gender of the patient and by the location of the infection. Symptoms in young women typically include a frequent and intense urge to urinate and a painful, burning feeling in the bladder or urethra during urination. Older women and men are more likely to be tired, shaky, and weak and have muscle aches and abdominal pain. A fever may mean the infection is in your kidneys. Other symptoms of a kidney infection include pain in your back or sides below the ribs, nausea, and vomiting.  DIAGNOSIS  To diagnose a UTI, your caregiver will ask you about your symptoms. Your caregiver also will ask to provide a urine sample. The urine sample will be tested for bacteria and white blood cells. White blood cells are made by your body to help fight infection.  TREATMENT   Typically, UTIs can be treated with medication. Because most UTIs are caused by a bacterial infection, they usually can be treated with the use of antibiotics. The choice of antibiotic and length of treatment depend on your symptoms and the type of bacteria causing your infection.  HOME CARE INSTRUCTIONS   If you were prescribed antibiotics, take them exactly as your caregiver instructs you. Finish the medication even if you feel better after you  have only taken some of the medication.   Drink enough water and fluids to keep your urine clear or pale yellow.   Avoid caffeine, tea, and carbonated beverages. They tend to irritate your bladder.   Empty your bladder often. Avoid holding urine for long periods of time.   Empty your bladder before and after sexual intercourse.   After a bowel movement, women should cleanse from front to back. Use each tissue only once.  SEEK MEDICAL CARE IF:    You have back pain.   You develop a fever.   Your symptoms do not begin to resolve within 3 days.  SEEK IMMEDIATE MEDICAL CARE IF:    You have severe back pain or lower abdominal pain.   You develop chills.   You have nausea or vomiting.   You have continued burning or discomfort with urination.  MAKE SURE YOU:    Understand these instructions.   Will watch your condition.   Will get help right away if you are not doing well or get worse.  Document Released: 05/02/2005 Document Revised: 01/22/2012 Document Reviewed: 08/31/2011  [employer] Patient Information 2014 [employer]

## 2013-07-24 NOTE — Progress Notes (Signed)
   Subjective:    Patient ID: Elizabeth Drake, female    DOB: 1941-05-20, 72 y.o.   MRN: [id number]  HPI Patient here today for hospital follow up- she was in the hospital for blood clot in right lower leg and 2 in lungs. Hospital started her on xeralto- they also changed a lot of her meds around while in the hospital. She also has a UTI and was given keflex  and she can't take pill because it gives her diarrhea so she only took 2 pills.    Review of Systems  Constitutional: Negative.   HENT: Negative.   Respiratory: Negative.   Cardiovascular: Negative.   Gastrointestinal: Negative.   Genitourinary: Positive for dysuria and hematuria.       Objective:   Physical Exam  Constitutional: She is oriented to person, place, and time. She appears well-developed and well-nourished.  Cardiovascular: Normal rate and normal heart sounds.   Pulmonary/Chest: Effort normal and breath sounds normal.  Abdominal: Soft. Bowel sounds are normal.  Genitourinary:  hematuria  Neurological: She is alert and oriented to person, place, and time.  Skin: Skin is warm and dry.  Psychiatric: She has a normal mood and affect. Her behavior is normal. Judgment and thought content normal.    BP 105/57  Pulse 71  Temp(Src) 98.8 F (37.1 C) (Oral)       Assessment & Plan:   1. Hospital discharge follow-up   2. UTI (urinary tract infection)   3. Acute pulmonary embolism   4. DVT (deep venous thrombosis), right    Meds ordered this encounter  Medications  . ciprofloxacin (CIPRO) 500 MG tablet    Sig: Take 1 tablet (500 mg total) by mouth 2 (two) times daily.    Dispense:  14 tablet    Refill:  0    Order Specific Question:  Supervising Provider    Answer:  Deborra Medina  Reviewed hospital records Force fluids Rest RTO prn Keep follow up appointment with Dr. Dorinda Hill, FNP

## 2013-07-27 DIAGNOSIS — R339 Retention of urine, unspecified: Secondary | ICD-10-CM | POA: Diagnosis not present

## 2013-07-28 DIAGNOSIS — D869 Sarcoidosis, unspecified: Secondary | ICD-10-CM | POA: Diagnosis not present

## 2013-07-28 DIAGNOSIS — E1142 Type 2 diabetes mellitus with diabetic polyneuropathy: Secondary | ICD-10-CM | POA: Diagnosis not present

## 2013-07-28 DIAGNOSIS — I82409 Acute embolism and thrombosis of unspecified deep veins of unspecified lower extremity: Secondary | ICD-10-CM | POA: Diagnosis not present

## 2013-07-28 DIAGNOSIS — I2699 Other pulmonary embolism without acute cor pulmonale: Secondary | ICD-10-CM | POA: Diagnosis not present

## 2013-07-28 DIAGNOSIS — E1149 Type 2 diabetes mellitus with other diabetic neurological complication: Secondary | ICD-10-CM | POA: Diagnosis not present

## 2013-07-28 DIAGNOSIS — G8220 Paraplegia, unspecified: Secondary | ICD-10-CM | POA: Diagnosis not present

## 2013-07-29 DIAGNOSIS — E1142 Type 2 diabetes mellitus with diabetic polyneuropathy: Secondary | ICD-10-CM | POA: Diagnosis not present

## 2013-07-29 DIAGNOSIS — I2699 Other pulmonary embolism without acute cor pulmonale: Secondary | ICD-10-CM | POA: Diagnosis not present

## 2013-07-29 DIAGNOSIS — I82409 Acute embolism and thrombosis of unspecified deep veins of unspecified lower extremity: Secondary | ICD-10-CM | POA: Diagnosis not present

## 2013-07-29 DIAGNOSIS — G8220 Paraplegia, unspecified: Secondary | ICD-10-CM | POA: Diagnosis not present

## 2013-07-29 DIAGNOSIS — D869 Sarcoidosis, unspecified: Secondary | ICD-10-CM | POA: Diagnosis not present

## 2013-07-29 DIAGNOSIS — E1149 Type 2 diabetes mellitus with other diabetic neurological complication: Secondary | ICD-10-CM | POA: Diagnosis not present

## 2013-07-31 DIAGNOSIS — D869 Sarcoidosis, unspecified: Secondary | ICD-10-CM | POA: Diagnosis not present

## 2013-07-31 DIAGNOSIS — I2699 Other pulmonary embolism without acute cor pulmonale: Secondary | ICD-10-CM | POA: Diagnosis not present

## 2013-07-31 DIAGNOSIS — I82409 Acute embolism and thrombosis of unspecified deep veins of unspecified lower extremity: Secondary | ICD-10-CM | POA: Diagnosis not present

## 2013-07-31 DIAGNOSIS — G8220 Paraplegia, unspecified: Secondary | ICD-10-CM | POA: Diagnosis not present

## 2013-07-31 DIAGNOSIS — E1149 Type 2 diabetes mellitus with other diabetic neurological complication: Secondary | ICD-10-CM | POA: Diagnosis not present

## 2013-07-31 DIAGNOSIS — E1142 Type 2 diabetes mellitus with diabetic polyneuropathy: Secondary | ICD-10-CM | POA: Diagnosis not present

## 2013-08-03 DIAGNOSIS — G8220 Paraplegia, unspecified: Secondary | ICD-10-CM | POA: Diagnosis not present

## 2013-08-03 DIAGNOSIS — D869 Sarcoidosis, unspecified: Secondary | ICD-10-CM | POA: Diagnosis not present

## 2013-08-03 DIAGNOSIS — E1142 Type 2 diabetes mellitus with diabetic polyneuropathy: Secondary | ICD-10-CM | POA: Diagnosis not present

## 2013-08-03 DIAGNOSIS — E1149 Type 2 diabetes mellitus with other diabetic neurological complication: Secondary | ICD-10-CM | POA: Diagnosis not present

## 2013-08-03 DIAGNOSIS — I2699 Other pulmonary embolism without acute cor pulmonale: Secondary | ICD-10-CM | POA: Diagnosis not present

## 2013-08-03 DIAGNOSIS — I82409 Acute embolism and thrombosis of unspecified deep veins of unspecified lower extremity: Secondary | ICD-10-CM | POA: Diagnosis not present

## 2013-08-04 DIAGNOSIS — R300 Dysuria: Secondary | ICD-10-CM | POA: Diagnosis not present

## 2013-08-04 DIAGNOSIS — R310 Gross hematuria: Secondary | ICD-10-CM | POA: Diagnosis not present

## 2013-08-04 DIAGNOSIS — R339 Retention of urine, unspecified: Secondary | ICD-10-CM | POA: Diagnosis not present

## 2013-08-05 DIAGNOSIS — E1142 Type 2 diabetes mellitus with diabetic polyneuropathy: Secondary | ICD-10-CM | POA: Diagnosis not present

## 2013-08-05 DIAGNOSIS — D869 Sarcoidosis, unspecified: Secondary | ICD-10-CM | POA: Diagnosis not present

## 2013-08-05 DIAGNOSIS — G8220 Paraplegia, unspecified: Secondary | ICD-10-CM | POA: Diagnosis not present

## 2013-08-05 DIAGNOSIS — I2699 Other pulmonary embolism without acute cor pulmonale: Secondary | ICD-10-CM | POA: Diagnosis not present

## 2013-08-05 DIAGNOSIS — E1149 Type 2 diabetes mellitus with other diabetic neurological complication: Secondary | ICD-10-CM | POA: Diagnosis not present

## 2013-08-05 DIAGNOSIS — I82409 Acute embolism and thrombosis of unspecified deep veins of unspecified lower extremity: Secondary | ICD-10-CM | POA: Diagnosis not present

## 2013-08-06 DIAGNOSIS — I82409 Acute embolism and thrombosis of unspecified deep veins of unspecified lower extremity: Secondary | ICD-10-CM | POA: Diagnosis not present

## 2013-08-06 DIAGNOSIS — D869 Sarcoidosis, unspecified: Secondary | ICD-10-CM | POA: Diagnosis not present

## 2013-08-06 DIAGNOSIS — E1142 Type 2 diabetes mellitus with diabetic polyneuropathy: Secondary | ICD-10-CM | POA: Diagnosis not present

## 2013-08-06 DIAGNOSIS — E1149 Type 2 diabetes mellitus with other diabetic neurological complication: Secondary | ICD-10-CM | POA: Diagnosis not present

## 2013-08-06 DIAGNOSIS — G8220 Paraplegia, unspecified: Secondary | ICD-10-CM | POA: Diagnosis not present

## 2013-08-06 DIAGNOSIS — I2699 Other pulmonary embolism without acute cor pulmonale: Secondary | ICD-10-CM | POA: Diagnosis not present

## 2013-08-11 DIAGNOSIS — I2699 Other pulmonary embolism without acute cor pulmonale: Secondary | ICD-10-CM | POA: Diagnosis not present

## 2013-08-11 DIAGNOSIS — I82409 Acute embolism and thrombosis of unspecified deep veins of unspecified lower extremity: Secondary | ICD-10-CM | POA: Diagnosis not present

## 2013-08-11 DIAGNOSIS — D869 Sarcoidosis, unspecified: Secondary | ICD-10-CM | POA: Diagnosis not present

## 2013-08-11 DIAGNOSIS — G8220 Paraplegia, unspecified: Secondary | ICD-10-CM | POA: Diagnosis not present

## 2013-08-11 DIAGNOSIS — E1142 Type 2 diabetes mellitus with diabetic polyneuropathy: Secondary | ICD-10-CM | POA: Diagnosis not present

## 2013-08-11 DIAGNOSIS — E1149 Type 2 diabetes mellitus with other diabetic neurological complication: Secondary | ICD-10-CM | POA: Diagnosis not present

## 2013-08-12 DIAGNOSIS — N3020 Other chronic cystitis without hematuria: Secondary | ICD-10-CM | POA: Diagnosis not present

## 2013-08-12 DIAGNOSIS — R339 Retention of urine, unspecified: Secondary | ICD-10-CM | POA: Diagnosis not present

## 2013-08-13 DIAGNOSIS — D869 Sarcoidosis, unspecified: Secondary | ICD-10-CM | POA: Diagnosis not present

## 2013-08-13 DIAGNOSIS — I2699 Other pulmonary embolism without acute cor pulmonale: Secondary | ICD-10-CM | POA: Diagnosis not present

## 2013-08-13 DIAGNOSIS — E1142 Type 2 diabetes mellitus with diabetic polyneuropathy: Secondary | ICD-10-CM | POA: Diagnosis not present

## 2013-08-13 DIAGNOSIS — E1149 Type 2 diabetes mellitus with other diabetic neurological complication: Secondary | ICD-10-CM | POA: Diagnosis not present

## 2013-08-13 DIAGNOSIS — G8220 Paraplegia, unspecified: Secondary | ICD-10-CM | POA: Diagnosis not present

## 2013-08-13 DIAGNOSIS — I82409 Acute embolism and thrombosis of unspecified deep veins of unspecified lower extremity: Secondary | ICD-10-CM | POA: Diagnosis not present

## 2013-08-14 ENCOUNTER — Telehealth: Payer: Self-pay | Admitting: Nurse Practitioner

## 2013-08-17 NOTE — Telephone Encounter (Signed)
She said the meds the hospital put her on

## 2013-08-17 NOTE — Telephone Encounter (Signed)
Spoke with patient- told to stay on all meds as increased- make appointment in 2 months for follow up

## 2013-08-17 NOTE — Telephone Encounter (Signed)
What meds is she talking about

## 2013-08-17 NOTE — Telephone Encounter (Signed)
Patient wants to know if she needs to stay on her medicine or come off some

## 2013-08-18 DIAGNOSIS — D869 Sarcoidosis, unspecified: Secondary | ICD-10-CM | POA: Diagnosis not present

## 2013-08-18 DIAGNOSIS — E1149 Type 2 diabetes mellitus with other diabetic neurological complication: Secondary | ICD-10-CM | POA: Diagnosis not present

## 2013-08-18 DIAGNOSIS — G8220 Paraplegia, unspecified: Secondary | ICD-10-CM | POA: Diagnosis not present

## 2013-08-18 DIAGNOSIS — I2699 Other pulmonary embolism without acute cor pulmonale: Secondary | ICD-10-CM | POA: Diagnosis not present

## 2013-08-18 DIAGNOSIS — I82409 Acute embolism and thrombosis of unspecified deep veins of unspecified lower extremity: Secondary | ICD-10-CM | POA: Diagnosis not present

## 2013-08-18 DIAGNOSIS — E1142 Type 2 diabetes mellitus with diabetic polyneuropathy: Secondary | ICD-10-CM | POA: Diagnosis not present

## 2013-08-20 ENCOUNTER — Telehealth: Payer: Self-pay | Admitting: Nurse Practitioner

## 2013-08-20 ENCOUNTER — Telehealth: Payer: Self-pay

## 2013-08-20 DIAGNOSIS — E1149 Type 2 diabetes mellitus with other diabetic neurological complication: Secondary | ICD-10-CM | POA: Diagnosis not present

## 2013-08-20 DIAGNOSIS — I82409 Acute embolism and thrombosis of unspecified deep veins of unspecified lower extremity: Secondary | ICD-10-CM | POA: Diagnosis not present

## 2013-08-20 DIAGNOSIS — G8220 Paraplegia, unspecified: Secondary | ICD-10-CM | POA: Diagnosis not present

## 2013-08-20 DIAGNOSIS — E1142 Type 2 diabetes mellitus with diabetic polyneuropathy: Secondary | ICD-10-CM | POA: Diagnosis not present

## 2013-08-20 DIAGNOSIS — I2699 Other pulmonary embolism without acute cor pulmonale: Secondary | ICD-10-CM | POA: Diagnosis not present

## 2013-08-20 DIAGNOSIS — D869 Sarcoidosis, unspecified: Secondary | ICD-10-CM | POA: Diagnosis not present

## 2013-08-20 NOTE — Telephone Encounter (Signed)
Need to contact physician that put her on this in hospital to change

## 2013-08-20 NOTE — Telephone Encounter (Signed)
Patient called requesting a cheaper medication other than Xerelto. Spoke with patient and advised her to contact her PCP.

## 2013-08-21 NOTE — Telephone Encounter (Signed)
Spoke with patient and she will call back to schedule an appt with tammy

## 2013-08-21 NOTE — Telephone Encounter (Signed)
Will need to see tammy to get changed to coumadin

## 2013-08-21 NOTE — Telephone Encounter (Signed)
This dr was just one that she seen in the hospital and that she will have this handled through her primary care physicians

## 2013-08-25 DIAGNOSIS — B351 Tinea unguium: Secondary | ICD-10-CM | POA: Diagnosis not present

## 2013-08-25 DIAGNOSIS — D869 Sarcoidosis, unspecified: Secondary | ICD-10-CM | POA: Diagnosis not present

## 2013-08-25 DIAGNOSIS — E1149 Type 2 diabetes mellitus with other diabetic neurological complication: Secondary | ICD-10-CM | POA: Diagnosis not present

## 2013-08-25 DIAGNOSIS — L03039 Cellulitis of unspecified toe: Secondary | ICD-10-CM | POA: Diagnosis not present

## 2013-08-25 DIAGNOSIS — L851 Acquired keratosis [keratoderma] palmaris et plantaris: Secondary | ICD-10-CM | POA: Diagnosis not present

## 2013-08-25 DIAGNOSIS — E1159 Type 2 diabetes mellitus with other circulatory complications: Secondary | ICD-10-CM | POA: Diagnosis not present

## 2013-08-25 DIAGNOSIS — I2699 Other pulmonary embolism without acute cor pulmonale: Secondary | ICD-10-CM | POA: Diagnosis not present

## 2013-08-25 DIAGNOSIS — I82409 Acute embolism and thrombosis of unspecified deep veins of unspecified lower extremity: Secondary | ICD-10-CM | POA: Diagnosis not present

## 2013-08-25 DIAGNOSIS — G8220 Paraplegia, unspecified: Secondary | ICD-10-CM | POA: Diagnosis not present

## 2013-08-25 DIAGNOSIS — E1142 Type 2 diabetes mellitus with diabetic polyneuropathy: Secondary | ICD-10-CM | POA: Diagnosis not present

## 2013-09-09 DIAGNOSIS — E1142 Type 2 diabetes mellitus with diabetic polyneuropathy: Secondary | ICD-10-CM | POA: Diagnosis not present

## 2013-09-09 DIAGNOSIS — I82409 Acute embolism and thrombosis of unspecified deep veins of unspecified lower extremity: Secondary | ICD-10-CM | POA: Diagnosis not present

## 2013-09-09 DIAGNOSIS — E1149 Type 2 diabetes mellitus with other diabetic neurological complication: Secondary | ICD-10-CM | POA: Diagnosis not present

## 2013-09-09 DIAGNOSIS — D869 Sarcoidosis, unspecified: Secondary | ICD-10-CM | POA: Diagnosis not present

## 2013-09-09 DIAGNOSIS — I2699 Other pulmonary embolism without acute cor pulmonale: Secondary | ICD-10-CM | POA: Diagnosis not present

## 2013-09-09 DIAGNOSIS — G8220 Paraplegia, unspecified: Secondary | ICD-10-CM | POA: Diagnosis not present

## 2013-09-10 DIAGNOSIS — D869 Sarcoidosis, unspecified: Secondary | ICD-10-CM | POA: Diagnosis not present

## 2013-09-10 DIAGNOSIS — M810 Age-related osteoporosis without current pathological fracture: Secondary | ICD-10-CM | POA: Diagnosis not present

## 2013-09-14 ENCOUNTER — Other Ambulatory Visit: Payer: Self-pay | Admitting: Physical Medicine & Rehabilitation

## 2013-09-14 DIAGNOSIS — R339 Retention of urine, unspecified: Secondary | ICD-10-CM | POA: Diagnosis not present

## 2013-09-14 DIAGNOSIS — N3020 Other chronic cystitis without hematuria: Secondary | ICD-10-CM | POA: Diagnosis not present

## 2013-09-17 ENCOUNTER — Ambulatory Visit (INDEPENDENT_AMBULATORY_CARE_PROVIDER_SITE_OTHER): Payer: Medicare Other | Admitting: Family Medicine

## 2013-09-17 VITALS — BP 114/66 | HR 96 | Temp 96.8°F

## 2013-09-17 DIAGNOSIS — J069 Acute upper respiratory infection, unspecified: Secondary | ICD-10-CM

## 2013-09-17 MED ORDER — CEPHALEXIN 500 MG PO CAPS: 500.0000 mg | ORAL_CAPSULE | Freq: Two times a day (BID) | ORAL | Status: DC

## 2013-09-17 NOTE — Progress Notes (Signed)
   Subjective:    Patient ID: Elizabeth Drake, female    DOB: 1940-09-03, 73 y.o.   MRN: [id number]  HPI  This 73 y.o. female presents for evaluation of URI sx's and cough.  Review of Systems C/o cough   No chest pain, SOB, HA, dizziness, vision change, N/V, diarrhea, constipation, dysuria, urinary urgency or frequency, myalgias, arthralgias or rash.  Objective:   Physical Exam  Vital signs noted  Wheelchair bound female.  HEENT - Head atraumatic Normocephalic                Eyes - PERRLA, Conjuctiva - clear Sclera- Clear EOMI                Ears - EAC's Wnl TM's Wnl Gross Hearing WNL                Throat - oropharanx wnl Respiratory - Lungs CTA bilateral Cardiac - RRR S1 and S2 without murmur      Assessment & Plan:  URI (upper respiratory infection) - Plan: cephALEXin (KEFLEX) 500 MG capsule Po bid x 10 days. Push po fluids, rest, tylenol and motrin otc prn as directed for fever, arthralgias, and myalgias.  Follow up prn if sx's continue or persist.  Lysbeth Penner FNP

## 2013-09-25 ENCOUNTER — Ambulatory Visit: Payer: Medicare Other | Admitting: Nurse Practitioner

## 2013-10-07 DIAGNOSIS — H348392 Tributary (branch) retinal vein occlusion, unspecified eye, stable: Secondary | ICD-10-CM | POA: Diagnosis not present

## 2013-10-08 ENCOUNTER — Other Ambulatory Visit: Payer: Self-pay

## 2013-10-08 NOTE — Telephone Encounter (Signed)
Last seen 09/17/13  B [place]   Last glucose 07/20/13  Alprazolam was not on EPIC list and this is for mail order

## 2013-10-12 ENCOUNTER — Ambulatory Visit (INDEPENDENT_AMBULATORY_CARE_PROVIDER_SITE_OTHER): Payer: Medicare Other | Admitting: Nurse Practitioner

## 2013-10-12 ENCOUNTER — Encounter: Payer: Self-pay | Admitting: Nurse Practitioner

## 2013-10-12 VITALS — BP 146/78 | HR 89 | Temp 97.5°F

## 2013-10-12 DIAGNOSIS — R000 Tachycardia, unspecified: Secondary | ICD-10-CM

## 2013-10-12 DIAGNOSIS — I10 Essential (primary) hypertension: Secondary | ICD-10-CM | POA: Diagnosis not present

## 2013-10-12 DIAGNOSIS — E785 Hyperlipidemia, unspecified: Secondary | ICD-10-CM

## 2013-10-12 DIAGNOSIS — E119 Type 2 diabetes mellitus without complications: Secondary | ICD-10-CM | POA: Diagnosis not present

## 2013-10-12 DIAGNOSIS — I2699 Other pulmonary embolism without acute cor pulmonale: Secondary | ICD-10-CM | POA: Diagnosis not present

## 2013-10-12 LAB — POCT GLYCOSYLATED HEMOGLOBIN (HGB A1C)

## 2013-10-12 MED ORDER — METOPROLOL TARTRATE 25 MG PO TABS: 25.0000 mg | ORAL_TABLET | Freq: Two times a day (BID) | ORAL | Status: DC

## 2013-10-12 MED ORDER — RIVAROXABAN 20 MG PO TABS: 20.0000 mg | ORAL_TABLET | Freq: Every day | ORAL | Status: DC

## 2013-10-12 MED ORDER — METFORMIN HCL 500 MG PO TABS: 500.0000 mg | ORAL_TABLET | Freq: Every day | ORAL | Status: DC

## 2013-10-12 MED ORDER — GLIMEPIRIDE 2 MG PO TABS: 2.0000 mg | ORAL_TABLET | Freq: Every day | ORAL | Status: DC

## 2013-10-12 NOTE — Patient Instructions (Signed)

## 2013-10-12 NOTE — Progress Notes (Signed)
Subjective:    Patient ID: Elizabeth Drake, female    DOB: [birth year], 73 y.o.   MRN: [id number]  HPI Patient in today to discuss changing some of her meds. SHe was in the hospital in November and was there almost a month- They made many changes to her medicines and she needs some refills and to go over meds. -glimepiride was changed to $RemoveBe'4mg'pTTWPdaat$ - when she got home her blood sugars were dropping so she put herself back on 2 mg and blood sugars have been stable. -hydrocodone was changed but Dr. Ouida Sills straightened her out. -Metformin was changed to 1000 bid but she ran out and has been on the metformin 500 bid and blood sugars are running in 90-130's fasting. -Was put on metoprolol BID- not taking because couldn't get refill- needs to stay on. Blood pressure has been running a little high. - concerned about xeralto $RemoveBefor'20mg'HKAwnGPFgvnr$  because she is about to run out and afraid no one will fill it for her.   Review of Systems  Constitutional: Positive for fatigue.  HENT: Negative.   Respiratory: Negative for cough, shortness of breath, wheezing and stridor.   Cardiovascular: Negative for chest pain, palpitations and leg swelling.  Gastrointestinal: Negative.   Genitourinary: Negative.   Musculoskeletal: Negative.   Neurological: Negative.   Hematological: Negative.   Psychiatric/Behavioral: Negative.   All other systems reviewed and are negative.       Objective:   Physical Exam  Constitutional: She is oriented to person, place, and time. She appears well-developed and well-nourished.  HENT:  Nose: Nose normal.  Mouth/Throat: Oropharynx is clear and moist.  Eyes: EOM are normal.  Neck: Trachea normal, normal range of motion and full passive range of motion without pain. Neck supple. No JVD present. Carotid bruit is not present. No thyromegaly present.  Cardiovascular: Normal rate, regular rhythm, normal heart sounds and intact distal pulses.  Exam reveals no gallop and no friction rub.   No murmur  heard. Pulmonary/Chest: Effort normal and breath sounds normal.  Abdominal: Soft. Bowel sounds are normal. She exhibits no distension and no mass. There is no tenderness.  Musculoskeletal: Normal range of motion.  Weak in all extremities- in wheel chair   Lymphadenopathy:    She has no cervical adenopathy.  Neurological: She is alert and oriented to person, place, and time. She has normal reflexes.  Skin: Skin is warm and dry.  Psychiatric: She has a normal mood and affect. Her behavior is normal. Judgment and thought content normal.    BP 146/78  Pulse 89  Temp(Src) 97.5 F (36.4 C) (Oral)       Assessment & Plan:   1. Tachycardia   2. Pulmonary embolism   3. Hyperlipidemia   4. Hypertension   5. DM type 2 (diabetes mellitus, type 2)    Orders Placed This Encounter  Procedures  . CMP14+EGFR  . NMR, lipoprofile  . POCT glycosylated hemoglobin (Hb A1C)   Meds ordered this encounter  Medications  . DISCONTD: glimepiride (AMARYL) 4 MG tablet    Sig: Take 4 mg by mouth daily with breakfast. Take 1/2 tab daily  . DISCONTD: metFORMIN (GLUCOPHAGE) 500 MG tablet    Sig: Take 500 mg by mouth daily.  . metFORMIN (GLUCOPHAGE) 500 MG tablet    Sig: Take 1 tablet (500 mg total) by mouth daily.    Dispense:  30 tablet    Refill:  5    Order Specific Question:  Supervising Provider  Answer:  Chipper Herb [[id number]]  . metoprolol tartrate (LOPRESSOR) 25 MG tablet    Sig: Take 1 tablet (25 mg total) by mouth 2 (two) times daily.    Dispense:  60 tablet    Refill:  5    Order Specific Question:  Supervising Provider    Answer:  Chipper Herb [[id number]]  . Rivaroxaban (XARELTO) 20 MG TABS tablet    Sig: Take 1 tablet (20 mg total) by mouth daily with supper.    Dispense:  30 tablet    Refill:  5    Order Specific Question:  Supervising Provider    Answer:  Chipper Herb [[id number]]  . glimepiride (AMARYL) 2 MG tablet    Sig: Take 1 tablet (2 mg total) by mouth daily before  breakfast.    Dispense:  30 tablet    Refill:  5    Order Specific Question:  Supervising Provider    Answer:  Chipper Herb [[id number]]    Labs pending Health maintenance reviewed Diet and exercise encouraged Continue all meds Follow up  In 3 months   [place], FNP

## 2013-10-14 LAB — NMR, LIPOPROFILE
CHOLESTEROL: 162 mg/dL (ref <200)
HDL Cholesterol by NMR: 49 mg/dL (ref >=40)
HDL PARTICLE NUMBER: 34 umol/L (ref >=30.5)
LDL PARTICLE NUMBER: 1057 nmol/L — AB (ref <1000)
LDL Size: 20.8 nm (ref >20.5)
LDLC SERPL CALC-MCNC: 87 mg/dL (ref <100)
Small LDL Particle Number: 370 nmol/L (ref <=527)
Triglycerides by NMR: 130 mg/dL (ref <150)

## 2013-10-14 LAB — CMP14+EGFR
ALBUMIN: 3.9 g/dL (ref 3.5–4.8)
ALK PHOS: 43 IU/L (ref 39–117)
ALT: 11 IU/L (ref 0–32)
AST: 14 IU/L (ref 0–40)
Albumin/Globulin Ratio: 1.8 (ref 1.1–2.5)
BUN / CREAT RATIO: 23 (ref 11–26)
BUN: 15 mg/dL (ref 8–27)
CALCIUM: 9.7 mg/dL (ref 8.7–10.3)
CHLORIDE: 105 mmol/L (ref 97–108)
CO2: 27 mmol/L (ref 18–29)
CREATININE: 0.64 mg/dL (ref 0.57–1.00)
GFR calc Af Amer: 102 mL/min/{1.73_m2} (ref >59)
GFR calc non Af Amer: 88 mL/min/{1.73_m2} (ref >59)
GLOBULIN, TOTAL: 2.2 g/dL (ref 1.5–4.5)
Glucose: 149 mg/dL — ABNORMAL HIGH (ref 65–99)
Potassium: 4 mmol/L (ref 3.5–5.2)
Sodium: 145 mmol/L — ABNORMAL HIGH (ref 134–144)
Total Bilirubin: 0.2 mg/dL (ref 0.0–1.2)
Total Protein: 6.1 g/dL (ref 6.0–8.5)

## 2013-10-16 ENCOUNTER — Ambulatory Visit: Payer: Medicare Other | Admitting: Nurse Practitioner

## 2013-10-19 DIAGNOSIS — N3020 Other chronic cystitis without hematuria: Secondary | ICD-10-CM | POA: Diagnosis not present

## 2013-10-19 DIAGNOSIS — R339 Retention of urine, unspecified: Secondary | ICD-10-CM | POA: Diagnosis not present

## 2013-10-22 DIAGNOSIS — N3020 Other chronic cystitis without hematuria: Secondary | ICD-10-CM | POA: Diagnosis not present

## 2013-11-03 DIAGNOSIS — B351 Tinea unguium: Secondary | ICD-10-CM | POA: Diagnosis not present

## 2013-11-03 DIAGNOSIS — L851 Acquired keratosis [keratoderma] palmaris et plantaris: Secondary | ICD-10-CM | POA: Diagnosis not present

## 2013-11-03 DIAGNOSIS — E1159 Type 2 diabetes mellitus with other circulatory complications: Secondary | ICD-10-CM | POA: Diagnosis not present

## 2013-11-17 DIAGNOSIS — L97509 Non-pressure chronic ulcer of other part of unspecified foot with unspecified severity: Secondary | ICD-10-CM | POA: Diagnosis not present

## 2013-12-03 DIAGNOSIS — D235 Other benign neoplasm of skin of trunk: Secondary | ICD-10-CM | POA: Diagnosis not present

## 2013-12-03 DIAGNOSIS — L608 Other nail disorders: Secondary | ICD-10-CM | POA: Diagnosis not present

## 2013-12-16 DIAGNOSIS — N3020 Other chronic cystitis without hematuria: Secondary | ICD-10-CM | POA: Diagnosis not present

## 2013-12-16 DIAGNOSIS — R310 Gross hematuria: Secondary | ICD-10-CM | POA: Diagnosis not present

## 2013-12-16 DIAGNOSIS — R339 Retention of urine, unspecified: Secondary | ICD-10-CM | POA: Diagnosis not present

## 2013-12-16 DIAGNOSIS — N3941 Urge incontinence: Secondary | ICD-10-CM | POA: Diagnosis not present

## 2013-12-21 ENCOUNTER — Other Ambulatory Visit: Payer: Self-pay | Admitting: Family Medicine

## 2013-12-21 DIAGNOSIS — J302 Other seasonal allergic rhinitis: Secondary | ICD-10-CM

## 2013-12-21 MED ORDER — MOMETASONE FUROATE 50 MCG/ACT NA SUSP: NASAL | Status: AC

## 2013-12-21 MED ORDER — MOMETASONE FUROATE 50 MCG/ACT NA SUSP: 2.0000 | Freq: Every day | NASAL | Status: DC

## 2014-01-06 DIAGNOSIS — H348392 Tributary (branch) retinal vein occlusion, unspecified eye, stable: Secondary | ICD-10-CM | POA: Diagnosis not present

## 2014-01-06 LAB — HM DIABETES EYE EXAM

## 2014-01-12 DIAGNOSIS — B351 Tinea unguium: Secondary | ICD-10-CM | POA: Diagnosis not present

## 2014-01-12 DIAGNOSIS — E1159 Type 2 diabetes mellitus with other circulatory complications: Secondary | ICD-10-CM | POA: Diagnosis not present

## 2014-01-12 DIAGNOSIS — L851 Acquired keratosis [keratoderma] palmaris et plantaris: Secondary | ICD-10-CM | POA: Diagnosis not present

## 2014-01-13 LAB — HM DIABETES FOOT EXAM: HM Diabetic Foot Exam: NORMAL

## 2014-01-20 ENCOUNTER — Ambulatory Visit (INDEPENDENT_AMBULATORY_CARE_PROVIDER_SITE_OTHER): Payer: Medicare Other | Admitting: Nurse Practitioner

## 2014-01-20 ENCOUNTER — Encounter: Payer: Self-pay | Admitting: Nurse Practitioner

## 2014-01-20 VITALS — BP 142/80 | HR 62 | Temp 98.2°F | Ht 64.0 in

## 2014-01-20 DIAGNOSIS — E119 Type 2 diabetes mellitus without complications: Secondary | ICD-10-CM

## 2014-01-20 DIAGNOSIS — I10 Essential (primary) hypertension: Secondary | ICD-10-CM

## 2014-01-20 DIAGNOSIS — E785 Hyperlipidemia, unspecified: Secondary | ICD-10-CM

## 2014-01-20 LAB — POCT GLYCOSYLATED HEMOGLOBIN (HGB A1C): Hemoglobin A1C: 6.3

## 2014-01-20 NOTE — Patient Instructions (Signed)

## 2014-01-20 NOTE — Progress Notes (Signed)
Subjective:    Patient ID: Elizabeth Drake, female    DOB: 04/22/1941, 73 y.o.   MRN: [id number]  Hypertension This is a chronic problem. The current episode started more than 1 year ago. The problem is unchanged. The problem is controlled. Pertinent negatives include no chest pain, headaches, neck pain, palpitations, peripheral edema or shortness of breath. There are no associated agents to hypertension. Risk factors for coronary artery disease include dyslipidemia, family history, post-menopausal state and sedentary lifestyle. Past treatments include beta blockers. The current treatment provides moderate improvement. Hypertensive end-organ damage includes CAD/MI.  Hyperlipidemia This is a chronic problem. The current episode started more than 1 year ago. The problem is controlled. Recent lipid tests were reviewed and are normal. Exacerbating diseases include diabetes. She has no history of hypothyroidism or obesity. Pertinent negatives include no chest pain or shortness of breath. Current antihyperlipidemic treatment includes diet change. The current treatment provides moderate improvement of lipids. Compliance problems include adherence to exercise.  Risk factors for coronary artery disease include post-menopausal, hypertension, diabetes mellitus, dyslipidemia and family history.  Diabetes She presents for her follow-up diabetic visit. She has type 2 diabetes mellitus. No MedicAlert identification noted. Her disease course has been stable. Pertinent negatives for hypoglycemia include no headaches. Pertinent negatives for diabetes include no chest pain, no polydipsia, no polyphagia, no polyuria, no weakness and no weight loss. Symptoms are stable. Risk factors for coronary artery disease include diabetes mellitus, dyslipidemia, family history, hypertension, post-menopausal and sedentary lifestyle. Current diabetic treatment includes oral agent (dual therapy). She is compliant with treatment most of the  time. Her weight is stable. She is following a low salt and diabetic diet. When asked about meal planning, she reported none. She has not had a previous visit with a dietician. She rarely participates in exercise. Her breakfast blood glucose is taken between 7-8 am. Her breakfast blood glucose range is generally 90-110 mg/dl. Her overall blood glucose range is 90-110 mg/dl. An ACE inhibitor/angiotensin II receptor blocker is not being taken. She sees a podiatrist.Eye exam is current.   Review of Systems  Constitutional: Negative.  Negative for weight loss.  HENT: Negative.   Eyes: Negative.   Respiratory: Negative for shortness of breath.   Cardiovascular: Negative for chest pain and palpitations.  Gastrointestinal: Negative.   Endocrine: Negative.  Negative for polydipsia, polyphagia and polyuria.  Genitourinary: Positive for urgency and frequency.  Musculoskeletal: Positive for back pain and gait problem. Negative for neck pain.  Skin: Negative.   Neurological: Negative.  Negative for weakness and headaches.       Objective:   Physical Exam  Constitutional: She is oriented to person, place, and time. She appears well-developed and well-nourished.  HENT:  Head: Normocephalic and atraumatic.  Eyes: Pupils are equal, round, and reactive to light.  Neck: Normal range of motion.  Cardiovascular: Normal rate, regular rhythm and normal heart sounds.   Pulmonary/Chest: Effort normal and breath sounds normal.  Abdominal: Soft.  Neurological: She is alert and oriented to person, place, and time.  Skin: Skin is warm and dry.   BP 142/80  Pulse 62  Temp(Src) 98.2 F (36.8 C) (Oral)  Ht $R'5\' 4"'gr$  (1.626 m)  Results for orders placed in visit on 01/20/14  POCT GLYCOSYLATED HEMOGLOBIN (HGB A1C)      Result Value Ref Range   Hemoglobin A1C 6.3          Assessment & Plan:   1. Hyperlipidemia   2. DM type 2 (  diabetes mellitus, type 2)   3. Hypertension    Orders Placed This Encounter    Procedures  . CMP14+EGFR  . NMR, lipoprofile  . POCT glycosylated hemoglobin (Hb A1C)  . [facility] DIABETES EYE EXAM    This external order was created through the Results Console.  Marland Kitchen [facility] DIABETES FOOT EXAM    This external order was created through the Results Console.    Labs pending Health maintenance reviewed Diet and exercise encouraged Continue all meds Follow up  In 3 months   Alice, FNP

## 2014-01-21 LAB — CMP14+EGFR
ALT: 13 IU/L (ref 0–32)
AST: 20 IU/L (ref 0–40)
Albumin/Globulin Ratio: 1.5 (ref 1.1–2.5)
Albumin: 4.1 g/dL (ref 3.5–4.8)
Alkaline Phosphatase: 48 IU/L (ref 39–117)
BUN/Creatinine Ratio: 27 — ABNORMAL HIGH (ref 11–26)
BUN: 17 mg/dL (ref 8–27)
CHLORIDE: 99 mmol/L (ref 97–108)
CO2: 23 mmol/L (ref 18–29)
CREATININE: 0.62 mg/dL (ref 0.57–1.00)
Calcium: 9.8 mg/dL (ref 8.7–10.3)
GFR calc Af Amer: 103 mL/min/{1.73_m2} (ref >59)
GFR calc non Af Amer: 89 mL/min/{1.73_m2} (ref >59)
GLOBULIN, TOTAL: 2.8 g/dL (ref 1.5–4.5)
GLUCOSE: 121 mg/dL — AB (ref 65–99)
Potassium: 4.2 mmol/L (ref 3.5–5.2)
Sodium: 143 mmol/L (ref 134–144)
TOTAL PROTEIN: 6.9 g/dL (ref 6.0–8.5)
Total Bilirubin: 0.4 mg/dL (ref 0.0–1.2)

## 2014-01-21 LAB — NMR, LIPOPROFILE
Cholesterol: 166 mg/dL (ref 100–199)
HDL CHOLESTEROL BY NMR: 49 mg/dL (ref >39)
HDL PARTICLE NUMBER: 36.1 umol/L (ref >=30.5)
LDL PARTICLE NUMBER: 1000 nmol/L — AB (ref <1000)
LDL Size: 20.5 nm (ref >20.5)
LDLC SERPL CALC-MCNC: 80 mg/dL (ref 0–99)
LP-IR Score: 41 (ref <=45)
Small LDL Particle Number: 431 nmol/L (ref <=527)
TRIGLYCERIDES BY NMR: 185 mg/dL — AB (ref 0–149)

## 2014-02-04 ENCOUNTER — Telehealth: Payer: Self-pay | Admitting: *Deleted

## 2014-02-04 NOTE — Telephone Encounter (Signed)
appt scheduled for diabetes initiative 04/28/2014 with MMM

## 2014-02-09 DIAGNOSIS — R339 Retention of urine, unspecified: Secondary | ICD-10-CM | POA: Diagnosis not present

## 2014-02-09 DIAGNOSIS — N3941 Urge incontinence: Secondary | ICD-10-CM | POA: Diagnosis not present

## 2014-02-09 DIAGNOSIS — N3020 Other chronic cystitis without hematuria: Secondary | ICD-10-CM | POA: Diagnosis not present

## 2014-03-10 DIAGNOSIS — D869 Sarcoidosis, unspecified: Secondary | ICD-10-CM | POA: Diagnosis not present

## 2014-03-10 DIAGNOSIS — M479 Spondylosis, unspecified: Secondary | ICD-10-CM | POA: Diagnosis not present

## 2014-03-18 DIAGNOSIS — N3941 Urge incontinence: Secondary | ICD-10-CM | POA: Diagnosis not present

## 2014-03-18 DIAGNOSIS — R339 Retention of urine, unspecified: Secondary | ICD-10-CM | POA: Diagnosis not present

## 2014-03-23 DIAGNOSIS — E1159 Type 2 diabetes mellitus with other circulatory complications: Secondary | ICD-10-CM | POA: Diagnosis not present

## 2014-03-23 DIAGNOSIS — L851 Acquired keratosis [keratoderma] palmaris et plantaris: Secondary | ICD-10-CM | POA: Diagnosis not present

## 2014-03-23 DIAGNOSIS — B351 Tinea unguium: Secondary | ICD-10-CM | POA: Diagnosis not present

## 2014-04-06 DIAGNOSIS — N3941 Urge incontinence: Secondary | ICD-10-CM | POA: Diagnosis not present

## 2014-04-06 DIAGNOSIS — N3020 Other chronic cystitis without hematuria: Secondary | ICD-10-CM | POA: Diagnosis not present

## 2014-04-06 DIAGNOSIS — R339 Retention of urine, unspecified: Secondary | ICD-10-CM | POA: Diagnosis not present

## 2014-04-06 DIAGNOSIS — N318 Other neuromuscular dysfunction of bladder: Secondary | ICD-10-CM | POA: Diagnosis not present

## 2014-04-17 ENCOUNTER — Other Ambulatory Visit: Payer: Self-pay | Admitting: Nurse Practitioner

## 2014-04-21 ENCOUNTER — Other Ambulatory Visit: Payer: Self-pay

## 2014-04-21 MED ORDER — METFORMIN HCL 500 MG PO TABS: 500.0000 mg | ORAL_TABLET | Freq: Every day | ORAL | Status: DC

## 2014-04-21 NOTE — Telephone Encounter (Signed)
Last seen and last glucose 01/20/14  MMM

## 2014-04-28 ENCOUNTER — Encounter: Payer: Self-pay | Admitting: Nurse Practitioner

## 2014-04-28 ENCOUNTER — Ambulatory Visit (INDEPENDENT_AMBULATORY_CARE_PROVIDER_SITE_OTHER): Payer: Medicare Other | Admitting: Nurse Practitioner

## 2014-04-28 VITALS — BP 148/68 | HR 61 | Temp 98.2°F

## 2014-04-28 DIAGNOSIS — E785 Hyperlipidemia, unspecified: Secondary | ICD-10-CM | POA: Diagnosis not present

## 2014-04-28 DIAGNOSIS — I10 Essential (primary) hypertension: Secondary | ICD-10-CM | POA: Diagnosis not present

## 2014-04-28 DIAGNOSIS — E118 Type 2 diabetes mellitus with unspecified complications: Secondary | ICD-10-CM | POA: Diagnosis not present

## 2014-04-28 LAB — POCT GLYCOSYLATED HEMOGLOBIN (HGB A1C): HEMOGLOBIN A1C: 6.1

## 2014-04-28 MED ORDER — METFORMIN HCL 500 MG PO TABS: 500.0000 mg | ORAL_TABLET | Freq: Every day | ORAL | Status: DC

## 2014-04-28 NOTE — Patient Instructions (Signed)

## 2014-04-28 NOTE — Progress Notes (Signed)
Subjective:    Patient ID: Elizabeth Drake, female    DOB: 1940/09/21, 73 y.o.   MRN: [id number]  Patient is here today for chronic disease follow up. No acute complaint.   Hypertension This is a chronic problem. The current episode started more than 1 year ago. The problem is unchanged. The problem is controlled. Pertinent negatives include no chest pain, headaches, neck pain, palpitations, peripheral edema or shortness of breath. There are no associated agents to hypertension. Risk factors for coronary artery disease include dyslipidemia, family history, post-menopausal state and sedentary lifestyle. Past treatments include beta blockers. The current treatment provides moderate improvement. Hypertensive end-organ damage includes CAD/MI.  Hyperlipidemia This is a chronic problem. The current episode started more than 1 year ago. The problem is controlled. Recent lipid tests were reviewed and are normal. Exacerbating diseases include diabetes. She has no history of hypothyroidism or obesity. Pertinent negatives include no chest pain or shortness of breath. Current antihyperlipidemic treatment includes diet change. The current treatment provides moderate improvement of lipids. Compliance problems include adherence to exercise.  Risk factors for coronary artery disease include post-menopausal, hypertension, diabetes mellitus, dyslipidemia and family history.  Diabetes She presents for her follow-up diabetic visit. She has type 2 diabetes mellitus. No MedicAlert identification noted. Her disease course has been stable. Pertinent negatives for hypoglycemia include no headaches. Pertinent negatives for diabetes include no chest pain, no polydipsia, no polyphagia, no polyuria, no weakness and no weight loss. Symptoms are stable. Risk factors for coronary artery disease include diabetes mellitus, dyslipidemia, family history, hypertension, post-menopausal and sedentary lifestyle. Current diabetic treatment  includes oral agent (dual therapy). She is compliant with treatment most of the time. Her weight is stable. She is following a low salt and diabetic diet. When asked about meal planning, she reported none. She has not had a previous visit with a dietician. She rarely participates in exercise. Her breakfast blood glucose is taken between 7-8 am. Her breakfast blood glucose range is generally 90-110 mg/dl. Her overall blood glucose range is 90-110 mg/dl. An ACE inhibitor/angiotensin II receptor blocker is not being taken. She sees a podiatrist.Eye exam is current.   Review of Systems  Constitutional: Negative.  Negative for weight loss.  HENT: Negative.   Eyes: Negative.   Respiratory: Negative for shortness of breath.   Cardiovascular: Negative for chest pain and palpitations.  Gastrointestinal: Negative.   Endocrine: Negative.  Negative for polydipsia, polyphagia and polyuria.  Genitourinary: Positive for urgency and frequency.  Musculoskeletal: Positive for back pain and gait problem. Negative for neck pain.  Skin: Negative.   Neurological: Negative.  Negative for weakness and headaches.       Objective:   Physical Exam  Constitutional: She is oriented to person, place, and time. She appears well-developed and well-nourished.  HENT:  Head: Normocephalic and atraumatic.  Eyes: Pupils are equal, round, and reactive to light.  Neck: Normal range of motion.  Cardiovascular: Normal rate, regular rhythm and normal heart sounds.   Pulmonary/Chest: Effort normal and breath sounds normal.  Abdominal: Soft.  Musculoskeletal: She exhibits edema (Left ankle, +1. ).  Neurological: She is alert and oriented to person, place, and time.  Skin: Skin is warm and dry.   BP 148/68  Pulse 61  Temp(Src) 98.2 F (36.8 C) (Oral)  Results for orders placed in visit on 04/28/14  POCT GLYCOSYLATED HEMOGLOBIN (HGB A1C)      Result Value Ref Range   Hemoglobin A1C 6.1  Assessment & Plan:    1. Type 2 diabetes mellitus with complication - POCT glycosylated hemoglobin (Hb A1C) - POCT UA - Microalbumin - metFORMIN (GLUCOPHAGE) 500 MG tablet; Take 1 tablet (500 mg total) by mouth daily.  Dispense: 30 tablet; Refill: 0  2. Hyperlipidemia - NMR, lipoprofile  3. Essential hypertension - CMP14+EGFR    Labs pending Health maintenance reviewed Diet and exercise encouraged Continue all meds Follow up  In 3 months PRN.   Mary-Margaret Hassell Done, FNP

## 2014-04-29 ENCOUNTER — Other Ambulatory Visit (INDEPENDENT_AMBULATORY_CARE_PROVIDER_SITE_OTHER): Payer: Medicare Other

## 2014-04-29 DIAGNOSIS — N390 Urinary tract infection, site not specified: Secondary | ICD-10-CM | POA: Diagnosis not present

## 2014-04-29 LAB — POCT URINALYSIS DIPSTICK
Bilirubin, UA: NEGATIVE
Glucose, UA: NEGATIVE
KETONES UA: NEGATIVE
Nitrite, UA: NEGATIVE
PROTEIN UA: NEGATIVE
RBC UA: NEGATIVE
SPEC GRAV UA: 1.02
Urobilinogen, UA: NEGATIVE
pH, UA: 5

## 2014-04-29 LAB — CMP14+EGFR
A/G RATIO: 1.3 (ref 1.1–2.5)
ALBUMIN: 3.8 g/dL (ref 3.5–4.8)
ALT: 10 IU/L (ref 0–32)
AST: 13 IU/L (ref 0–40)
Alkaline Phosphatase: 73 IU/L (ref 39–117)
BUN/Creatinine Ratio: 29 — ABNORMAL HIGH (ref 11–26)
BUN: 20 mg/dL (ref 8–27)
CALCIUM: 9.8 mg/dL (ref 8.7–10.3)
CO2: 29 mmol/L (ref 18–29)
CREATININE: 0.68 mg/dL (ref 0.57–1.00)
Chloride: 102 mmol/L (ref 97–108)
GFR calc Af Amer: 100 mL/min/{1.73_m2} (ref >59)
GFR, EST NON AFRICAN AMERICAN: 86 mL/min/{1.73_m2} (ref >59)
GLOBULIN, TOTAL: 3 g/dL (ref 1.5–4.5)
GLUCOSE: 115 mg/dL — AB (ref 65–99)
POTASSIUM: 3.8 mmol/L (ref 3.5–5.2)
Sodium: 141 mmol/L (ref 134–144)
TOTAL PROTEIN: 6.8 g/dL (ref 6.0–8.5)
Total Bilirubin: 0.4 mg/dL (ref 0.0–1.2)

## 2014-04-29 LAB — POCT UA - MICROSCOPIC ONLY
Casts, Ur, LPF, POC: NEGATIVE
Crystals, Ur, HPF, POC: NEGATIVE
MUCUS UA: NEGATIVE

## 2014-04-29 LAB — NMR, LIPOPROFILE
CHOLESTEROL: 160 mg/dL (ref 100–199)
HDL CHOLESTEROL BY NMR: 46 mg/dL (ref >39)
HDL Particle Number: 30.7 umol/L (ref >=30.5)
LDL Particle Number: 998 nmol/L (ref <1000)
LDL Size: 20.7 nm (ref >20.5)
LDLC SERPL CALC-MCNC: 86 mg/dL (ref 0–99)
LP-IR Score: 34 (ref <=45)
SMALL LDL PARTICLE NUMBER: 302 nmol/L (ref <=527)
TRIGLYCERIDES BY NMR: 139 mg/dL (ref 0–149)

## 2014-05-18 ENCOUNTER — Other Ambulatory Visit: Payer: Self-pay | Admitting: Family Medicine

## 2014-05-23 IMAGING — CR DG CHEST 1V PORT
1 series · 1 of 1 positions shown · non-contrast
Comparison: 05/02/2010 and 11/15/2007 chest radiographs

CLINICAL DATA: Shortness of breath.  Low-grade fever.

EXAM:
PORTABLE CHEST - 1 VIEW

[AP]
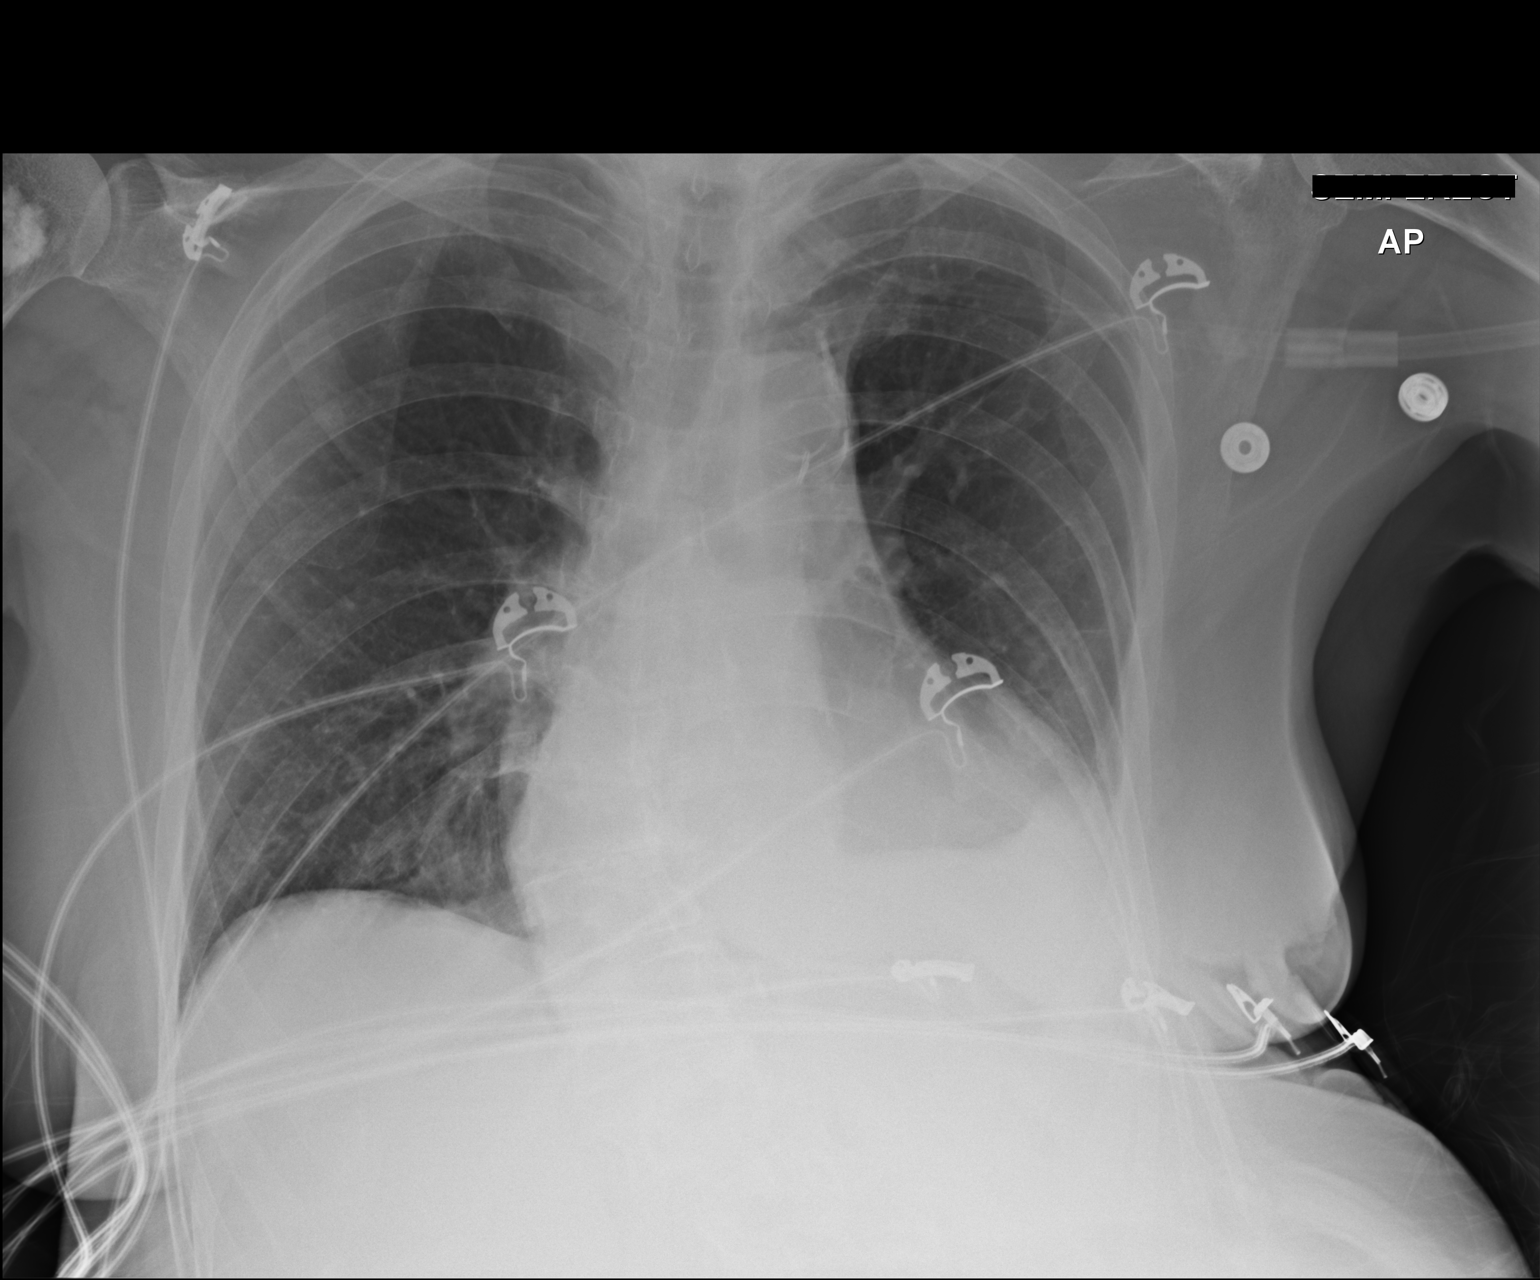

[1 of 1 positions shown; findings below may reference images not displayed]

FINDINGS: The cardiac silhouette remains mildly enlarged, unchanged.
Calcification and likely surgical clips project over the aortic
arch, unchanged. Left lung volume loss is unchanged with persistent
left basilar opacity, which may reflect chronic atelectasis and
elevation of the left hemidiaphragm although a small amount of
pleural fluid is not excluded. The right lung remains clear without
evidence of right-sided pleural effusion. There is no evidence of
pneumothorax. Moderate dextroscoliosis centered in the lower
thoracic spine is stable to slightly increased from 05/02/2010 and
mildly increased from 0777. Sclerotic lesion is again partially
visualized in the right humeral head, incompletely evaluated.
IMPRESSION: Unchanged appearance of left lung volume loss without evidence of
acute airspace disease. Mild progression of thoracic scoliosis.

## 2014-06-01 ENCOUNTER — Ambulatory Visit: Payer: Medicare Other

## 2014-06-01 DIAGNOSIS — L84 Corns and callosities: Secondary | ICD-10-CM | POA: Diagnosis not present

## 2014-06-01 DIAGNOSIS — B351 Tinea unguium: Secondary | ICD-10-CM | POA: Diagnosis not present

## 2014-06-01 DIAGNOSIS — E1151 Type 2 diabetes mellitus with diabetic peripheral angiopathy without gangrene: Secondary | ICD-10-CM | POA: Diagnosis not present

## 2014-06-15 ENCOUNTER — Other Ambulatory Visit: Payer: Self-pay | Admitting: *Deleted

## 2014-06-15 ENCOUNTER — Ambulatory Visit (INDEPENDENT_AMBULATORY_CARE_PROVIDER_SITE_OTHER): Payer: Medicare Other

## 2014-06-15 DIAGNOSIS — Z23 Encounter for immunization: Secondary | ICD-10-CM

## 2014-06-15 MED ORDER — RIVAROXABAN 20 MG PO TABS: 20.0000 mg | ORAL_TABLET | Freq: Every day | ORAL | Status: DC

## 2014-06-15 NOTE — Telephone Encounter (Signed)
Last ov 9/15. 

## 2014-07-05 DIAGNOSIS — H34831 Tributary (branch) retinal vein occlusion, right eye: Secondary | ICD-10-CM | POA: Diagnosis not present

## 2014-07-05 DIAGNOSIS — H3581 Retinal edema: Secondary | ICD-10-CM | POA: Diagnosis not present

## 2014-07-06 DIAGNOSIS — N3281 Overactive bladder: Secondary | ICD-10-CM | POA: Diagnosis not present

## 2014-07-06 DIAGNOSIS — N3020 Other chronic cystitis without hematuria: Secondary | ICD-10-CM | POA: Diagnosis not present

## 2014-07-06 DIAGNOSIS — R339 Retention of urine, unspecified: Secondary | ICD-10-CM | POA: Diagnosis not present

## 2014-07-06 DIAGNOSIS — N390 Urinary tract infection, site not specified: Secondary | ICD-10-CM | POA: Diagnosis not present

## 2014-07-06 DIAGNOSIS — N3941 Urge incontinence: Secondary | ICD-10-CM | POA: Diagnosis not present

## 2014-07-08 DIAGNOSIS — D8689 Sarcoidosis of other sites: Secondary | ICD-10-CM | POA: Diagnosis not present

## 2014-07-08 DIAGNOSIS — M47816 Spondylosis without myelopathy or radiculopathy, lumbar region: Secondary | ICD-10-CM | POA: Diagnosis not present

## 2014-07-08 DIAGNOSIS — M810 Age-related osteoporosis without current pathological fracture: Secondary | ICD-10-CM | POA: Diagnosis not present

## 2014-07-08 DIAGNOSIS — L603 Nail dystrophy: Secondary | ICD-10-CM | POA: Diagnosis not present

## 2014-08-09 DIAGNOSIS — N3020 Other chronic cystitis without hematuria: Secondary | ICD-10-CM | POA: Diagnosis not present

## 2014-08-09 DIAGNOSIS — N3941 Urge incontinence: Secondary | ICD-10-CM | POA: Diagnosis not present

## 2014-08-12 ENCOUNTER — Emergency Department (HOSPITAL_COMMUNITY)
Admission: EM | Admit: 2014-08-12 | Discharge: 2014-08-13 | Disposition: A | Discharge status: Home / Self Care | Payer: Medicare Other | Attending: Emergency Medicine | Admitting: Emergency Medicine

## 2014-08-12 ENCOUNTER — Encounter (HOSPITAL_COMMUNITY): Payer: Self-pay | Admitting: *Deleted

## 2014-08-12 DIAGNOSIS — Z862 Personal history of diseases of the blood and blood-forming organs and certain disorders involving the immune mechanism: Secondary | ICD-10-CM | POA: Insufficient documentation

## 2014-08-12 DIAGNOSIS — E119 Type 2 diabetes mellitus without complications: Secondary | ICD-10-CM | POA: Insufficient documentation

## 2014-08-12 DIAGNOSIS — M810 Age-related osteoporosis without current pathological fracture: Secondary | ICD-10-CM | POA: Diagnosis not present

## 2014-08-12 DIAGNOSIS — I517 Cardiomegaly: Secondary | ICD-10-CM | POA: Diagnosis not present

## 2014-08-12 DIAGNOSIS — Z86711 Personal history of pulmonary embolism: Secondary | ICD-10-CM | POA: Diagnosis not present

## 2014-08-12 DIAGNOSIS — R109 Unspecified abdominal pain: Secondary | ICD-10-CM | POA: Diagnosis not present

## 2014-08-12 DIAGNOSIS — R404 Transient alteration of awareness: Secondary | ICD-10-CM | POA: Diagnosis not present

## 2014-08-12 DIAGNOSIS — Z7901 Long term (current) use of anticoagulants: Secondary | ICD-10-CM | POA: Diagnosis not present

## 2014-08-12 DIAGNOSIS — Z8719 Personal history of other diseases of the digestive system: Secondary | ICD-10-CM | POA: Insufficient documentation

## 2014-08-12 DIAGNOSIS — R531 Weakness: Secondary | ICD-10-CM | POA: Diagnosis not present

## 2014-08-12 DIAGNOSIS — M6281 Muscle weakness (generalized): Secondary | ICD-10-CM | POA: Diagnosis not present

## 2014-08-12 DIAGNOSIS — Z7952 Long term (current) use of systemic steroids: Secondary | ICD-10-CM | POA: Diagnosis not present

## 2014-08-12 DIAGNOSIS — Z87891 Personal history of nicotine dependence: Secondary | ICD-10-CM | POA: Insufficient documentation

## 2014-08-12 DIAGNOSIS — N3281 Overactive bladder: Secondary | ICD-10-CM | POA: Insufficient documentation

## 2014-08-12 DIAGNOSIS — Z86718 Personal history of other venous thrombosis and embolism: Secondary | ICD-10-CM | POA: Diagnosis not present

## 2014-08-12 DIAGNOSIS — R05 Cough: Secondary | ICD-10-CM | POA: Insufficient documentation

## 2014-08-12 DIAGNOSIS — Z79899 Other long term (current) drug therapy: Secondary | ICD-10-CM | POA: Diagnosis not present

## 2014-08-12 DIAGNOSIS — M549 Dorsalgia, unspecified: Secondary | ICD-10-CM | POA: Insufficient documentation

## 2014-08-12 MED ORDER — SODIUM CHLORIDE 0.9 % IV BOLUS (SEPSIS)
1000.0000 mL | Freq: Once | INTRAVENOUS | Status: AC
  Administered 2014-08-13: 1000 mL via INTRAVENOUS

## 2014-08-12 NOTE — ED Notes (Signed)
Pt in via EMS c/o generalized weakness that started tonight, cough noted that patient states has been going on for a few days, VSS en route, pt also started on two medications on Monday from a urologist but not sure what for. Pt alert and oriented, no distress noted

## 2014-08-12 NOTE — ED Provider Notes (Signed)
CSN: [id number]     Arrival date & time 08/12/14  2339 History  This chart was scribed for Wandra Arthurs, MD by Hilda Lias, ED Scribe. This patient was seen in room D35C/D35C and the patient's care was started at 11:47 PM.    Chief Complaint  Patient presents with  . Weakness      The history is provided by the patient. No language interpreter was used.     HPI Comments: Elizabeth Drake is a 74 y.o. female brought in by EMS who presents to the Emergency Department complaining of weakness in her legs that began 3 hours PTA when she tried to get up out of her chair to use the restroom. Pt states that she can usually get up and walk to use the restroom, but states that tonight she could not do it. Pt notes that she normally has pain in her back and has chronic right leg weakness. She has history of overactive bladder and recently started taking Toviaz prescribed by her urologist this week and thinks that the medication may be partly responsible for her present issue.She states that she has had mild cold-like symptoms for a few days with an associated non-productive cough. Pt denies vomiting or abdominal pain. Patient states that she has similar episode before and ended up having DVT/PE (currently on xarelto) and UTIs.      Past Medical History  Diagnosis Date  . Sarcoidosis   . Hiatal hernia   . Diabetes mellitus without complication   . Osteoporosis   . DVT (deep venous thrombosis)   . Pulmonary emboli    Past Surgical History  Procedure Laterality Date  . Abdominal hysterectomy    . Joint replacement      toe  . Lung lobectomy Left     Lower   Family History  Problem Relation Age of Onset  . Heart disease Mother   . Hypertension Mother   . Cancer Father     pancratic  . Cancer - Other Sister    History  Substance Use Topics  . Smoking status: Former Smoker    Types: Cigarettes    Quit date: 12/31/2000  . Smokeless tobacco: Not on file  . Alcohol Use: No   OB History     No data available     Review of Systems  Respiratory: Positive for cough.   Gastrointestinal: Positive for abdominal pain. Negative for vomiting.      Allergies  Advil; Azo; Indocin; Keflex; Loratadine; and Percocet  Home Medications   Prior to Admission medications   Medication Sig Start Date End Date Taking? Authorizing Provider  calcium-vitamin D (OSCAL 500/200 D-3) 500-200 MG-UNIT per tablet Take 1 tablet by mouth daily with breakfast.   Yes Historical Provider, MD  glimepiride (AMARYL) 2 MG tablet Take 1 tablet (2 mg total) by mouth daily before breakfast. 10/12/13  Yes Mary-Margaret Hassell Done, FNP  HYDROcodone-acetaminophen (NORCO/VICODIN) 5-325 MG per tablet Take 1-2 tablets by mouth every 4 (four) hours as needed for moderate pain.   Yes Historical Provider, MD  metFORMIN (GLUCOPHAGE) 500 MG tablet Take 1 tablet (500 mg total) by mouth daily. 04/28/14  Yes Mary-Margaret Hassell Done, FNP  metoprolol tartrate (LOPRESSOR) 25 MG tablet TAKE ONE TABLET BY MOUTH TWICE DAILY 04/19/14  Yes Chipper Herb, MD  mometasone (NASONEX) 50 MCG/ACT nasal spray 2 sprays per nostril qd Patient taking differently: Place 2 sprays into the nose daily as needed (allergies). 2 sprays per nostril qd 12/21/13  Yes  Lysbeth Penner, FNP  Multiple Vitamin (MULTIVITAMIN WITH MINERALS) TABS tablet Take 1 tablet by mouth daily.   Yes Historical Provider, MD  predniSONE (DELTASONE) 5 MG tablet Take 5 mg by mouth daily with breakfast.    Yes Historical Provider, MD  rivaroxaban (XARELTO) 20 MG TABS tablet Take 1 tablet (20 mg total) by mouth daily with supper. 06/15/14  Yes Mary-Margaret Hassell Done, FNP  glimepiride (AMARYL) 2 MG tablet TAKE ONE TABLET BY MOUTH ONCE DAILY Patient not taking: Reported on 08/13/2014 05/19/14   Mary-Margaret Hassell Done, FNP  tamsulosin (FLOMAX) 0.4 MG CAPS capsule Take 0.4 mg by mouth daily after breakfast.    Historical Provider, MD   BP 158/76 mmHg  Pulse 98  Temp(Src) 99.7 F (37.6 C) (Oral)   Resp 22  SpO2 98% Physical Exam  Constitutional: She is oriented to person, place, and time.  Chronically ill   HENT:  Head: Normocephalic and atraumatic.  Mouth/Throat: Oropharynx is clear and moist. No oropharyngeal exudate.  Mucous membranes dry   Eyes: Conjunctivae and EOM are normal. Pupils are equal, round, and reactive to light.  Neck: Normal range of motion. Neck supple.  Cardiovascular: Normal rate, regular rhythm, normal heart sounds and intact distal pulses.   No murmur heard. Pulmonary/Chest: Effort normal and breath sounds normal. No respiratory distress.  Lungs clear  Abdominal: Soft. She exhibits no distension. There is no tenderness. There is no rebound and no guarding.  Abdomen soft and non-tender  Musculoskeletal: Normal range of motion. She exhibits no edema or tenderness.  Neurological: She is alert and oriented to person, place, and time. No cranial nerve deficit. She exhibits normal muscle tone. Coordination normal.  2/5 on left leg (chronic)  3/5 on right leg (chronic) CN 2-12 intact   Skin: Skin is warm and dry.  Psychiatric: She has a normal mood and affect. Her behavior is normal.  Nursing note and vitals reviewed.   ED Course  Procedures (including critical care time)  DIAGNOSTIC STUDIES: Oxygen Saturation is 99% on RA, normal by my interpretation.    COORDINATION OF CARE: 11:53 PM Discussed treatment plan with pt at bedside and pt agreed to plan.   Labs Review Labs Reviewed  COMPREHENSIVE METABOLIC PANEL - Abnormal; Notable for the following:    Glucose, Bld 116 (*)    GFR calc non Af Amer 87 (*)    All other components within normal limits  URINALYSIS, ROUTINE W REFLEX MICROSCOPIC - Abnormal; Notable for the following:    Hgb urine dipstick MODERATE (*)    All other components within normal limits  URINE MICROSCOPIC-ADD ON - Abnormal; Notable for the following:    Crystals CA OXALATE CRYSTALS (*)    All other components within normal  limits  URINE CULTURE  CBC WITH DIFFERENTIAL  Randolm Idol, ED    Imaging Review Dg Chest 2 View  08/13/2014   CLINICAL DATA:  Lower extremity weakness since earlier this evening.  EXAM: CHEST  2 VIEW  COMPARISON:  06/24/2013  FINDINGS: Shallow inspiration. Cardiac enlargement without vascular congestion. Calcified and tortuous aorta. Mild blunting of the left costophrenic angle may be due to fluid or thickened pleura. No change since prior study. No focal airspace consolidation in the lungs. Calcified and tortuous aorta. Thoracic scoliosis convex towards the right. Sclerosis in the proximal right humerus is unchanged.  IMPRESSION: Unchanged appearance of the chest since previous study. Cardiac enlargement without vascular congestion. No focal consolidation. Fluid or thickened pleura in the left lung base.  Thoracic scoliosis and degenerative changes. Benign-appearing sclerosis in the right humerus.   Electronically Signed   By: Lucienne Capers M.D.   On: 08/13/2014 00:39     EKG Interpretation   Date/Time:  Thursday August 12 2014 23:49:03 EST Ventricular Rate:  85 PR Interval:  149 QRS Duration: 123 QT Interval:  373 QTC Calculation: 443 R Axis:   -81 Text Interpretation:  Sinus rhythm Atrial premature complex Nonspecific  IVCD with LAD Left ventricular hypertrophy Inferior infarct, acute (RCA)  Anterior Q waves, possibly due to LVH Probable RV involvement, suggest  recording right precordial leads Baseline wander in lead(s) V1 LBBB,  unchanged  Confirmed by YAO  MD, DAVID ([id number]) on 08/12/2014 11:55:33 PM      MDM   Final diagnoses:  Weakness   Elizabeth Drake is a 74 y.o. female here with worsening of chronic weakness. Consider sepsis vs electrolyte abnormalities. I doubt stroke or recurrent PE. Will check labs, CXR, UA.   3:13 AM Labs at baseline. CXR and UA unremarkable. Now can transfer to bedside commode (baseline). Vitals stable. Can dc home.    I personally performed  the services described in this documentation, which was scribed in my presence. The recorded information has been reviewed and is accurate.   Wandra Arthurs, MD 08/13/14 [phone]

## 2014-08-12 NOTE — ED Notes (Addendum)
Elizabeth Drake [phone] sister, cell [phone]

## 2014-08-12 NOTE — ED Notes (Signed)
MD at bedside. 

## 2014-08-13 ENCOUNTER — Emergency Department (HOSPITAL_COMMUNITY): Payer: Medicare Other

## 2014-08-13 DIAGNOSIS — R531 Weakness: Secondary | ICD-10-CM | POA: Diagnosis not present

## 2014-08-13 DIAGNOSIS — M6281 Muscle weakness (generalized): Secondary | ICD-10-CM | POA: Diagnosis not present

## 2014-08-13 DIAGNOSIS — I517 Cardiomegaly: Secondary | ICD-10-CM | POA: Diagnosis not present

## 2014-08-13 LAB — URINE MICROSCOPIC-ADD ON

## 2014-08-13 LAB — URINALYSIS, ROUTINE W REFLEX MICROSCOPIC
BILIRUBIN URINE: NEGATIVE
Glucose, UA: NEGATIVE mg/dL
Ketones, ur: NEGATIVE mg/dL
Leukocytes, UA: NEGATIVE
NITRITE: NEGATIVE
PH: 6 (ref 5.0–8.0)
PROTEIN: NEGATIVE mg/dL
Specific Gravity, Urine: 1.018 (ref 1.005–1.030)
Urobilinogen, UA: 0.2 mg/dL (ref 0.0–1.0)

## 2014-08-13 LAB — CBC WITH DIFFERENTIAL/PLATELET
BASOS ABS: 0 10*3/uL (ref 0.0–0.1)
BASOS PCT: 0 % (ref 0–1)
EOS ABS: 0 10*3/uL (ref 0.0–0.7)
EOS PCT: 0 % (ref 0–5)
HCT: 39.3 % (ref 36.0–46.0)
HEMOGLOBIN: 13.1 g/dL (ref 12.0–15.0)
Lymphocytes Relative: 20 % (ref 12–46)
Lymphs Abs: 1.6 10*3/uL (ref 0.7–4.0)
MCH: 32 pg (ref 26.0–34.0)
MCHC: 33.3 g/dL (ref 30.0–36.0)
MCV: 95.9 fL (ref 78.0–100.0)
Monocytes Absolute: 0.9 10*3/uL (ref 0.1–1.0)
Monocytes Relative: 11 % (ref 3–12)
Neutro Abs: 5.5 10*3/uL (ref 1.7–7.7)
Neutrophils Relative %: 69 % (ref 43–77)
Platelets: 184 10*3/uL (ref 150–400)
RBC: 4.1 MIL/uL (ref 3.87–5.11)
RDW: 12.3 % (ref 11.5–15.5)
WBC: 7.9 10*3/uL (ref 4.0–10.5)

## 2014-08-13 LAB — COMPREHENSIVE METABOLIC PANEL
ALK PHOS: 49 U/L (ref 39–117)
ALT: 11 U/L (ref 0–35)
AST: 34 U/L (ref 0–37)
Albumin: 3.5 g/dL (ref 3.5–5.2)
Anion gap: 7 (ref 5–15)
BILIRUBIN TOTAL: 1.2 mg/dL (ref 0.3–1.2)
BUN: 13 mg/dL (ref 6–23)
CALCIUM: 10.1 mg/dL (ref 8.4–10.5)
CHLORIDE: 103 meq/L (ref 96–112)
CO2: 29 mmol/L (ref 19–32)
Creatinine, Ser: 0.61 mg/dL (ref 0.50–1.10)
GFR calc Af Amer: >90 mL/min (ref >90)
GFR calc non Af Amer: 87 mL/min — ABNORMAL LOW (ref >90)
Glucose, Bld: 116 mg/dL — ABNORMAL HIGH (ref 70–99)
Potassium: 3.9 mmol/L (ref 3.5–5.1)
Sodium: 139 mmol/L (ref 135–145)
Total Protein: 7 g/dL (ref 6.0–8.3)

## 2014-08-13 LAB — I-STAT TROPONIN, ED: Troponin i, poc: 0.01 ng/mL (ref 0.00–0.08)

## 2014-08-13 NOTE — Discharge Instructions (Signed)
Stop taking Lisbeth Ply for now.   Continue your other medications.   Follow up with your doctor.   Return to ER if you have worse weakness, trouble moving, fevers.

## 2014-08-14 LAB — URINE CULTURE
COLONY COUNT: NO GROWTH
CULTURE: NO GROWTH

## 2014-08-20 ENCOUNTER — Telehealth: Payer: Self-pay | Admitting: Nurse Practitioner

## 2014-08-20 NOTE — Telephone Encounter (Signed)
Patient spoke with Zadie Rhine.

## 2014-08-24 DIAGNOSIS — B351 Tinea unguium: Secondary | ICD-10-CM | POA: Diagnosis not present

## 2014-08-24 DIAGNOSIS — L84 Corns and callosities: Secondary | ICD-10-CM | POA: Diagnosis not present

## 2014-08-24 DIAGNOSIS — E1151 Type 2 diabetes mellitus with diabetic peripheral angiopathy without gangrene: Secondary | ICD-10-CM | POA: Diagnosis not present

## 2014-09-02 ENCOUNTER — Other Ambulatory Visit: Payer: Self-pay | Admitting: *Deleted

## 2014-09-09 DIAGNOSIS — N3941 Urge incontinence: Secondary | ICD-10-CM | POA: Diagnosis not present

## 2014-09-10 ENCOUNTER — Other Ambulatory Visit: Payer: Self-pay | Admitting: Nurse Practitioner

## 2014-09-13 ENCOUNTER — Encounter (HOSPITAL_COMMUNITY)
Admission: EM | Disposition: A | Discharge status: Skilled Nursing Facility | Payer: Self-pay | Source: Home / Self Care | Attending: Internal Medicine

## 2014-09-13 ENCOUNTER — Inpatient Hospital Stay (HOSPITAL_COMMUNITY): Payer: Medicare Other | Admitting: Anesthesiology

## 2014-09-13 ENCOUNTER — Emergency Department (HOSPITAL_COMMUNITY): Payer: Medicare Other

## 2014-09-13 ENCOUNTER — Inpatient Hospital Stay (HOSPITAL_COMMUNITY)
Admission: EM | Admit: 2014-09-13 | Discharge: 2014-09-17 | DRG: 854 | Disposition: A | Discharge status: Skilled Nursing Facility | Payer: Medicare Other | Attending: Internal Medicine | Admitting: Internal Medicine

## 2014-09-13 ENCOUNTER — Encounter (HOSPITAL_COMMUNITY): Payer: Self-pay | Admitting: Family Medicine

## 2014-09-13 DIAGNOSIS — E43 Unspecified severe protein-calorie malnutrition: Secondary | ICD-10-CM | POA: Diagnosis not present

## 2014-09-13 DIAGNOSIS — I480 Paroxysmal atrial fibrillation: Secondary | ICD-10-CM | POA: Diagnosis present

## 2014-09-13 DIAGNOSIS — L03113 Cellulitis of right upper limb: Secondary | ICD-10-CM | POA: Diagnosis present

## 2014-09-13 DIAGNOSIS — A419 Sepsis, unspecified organism: Secondary | ICD-10-CM | POA: Diagnosis not present

## 2014-09-13 DIAGNOSIS — N3281 Overactive bladder: Secondary | ICD-10-CM | POA: Diagnosis present

## 2014-09-13 DIAGNOSIS — Z90710 Acquired absence of both cervix and uterus: Secondary | ICD-10-CM | POA: Diagnosis not present

## 2014-09-13 DIAGNOSIS — E039 Hypothyroidism, unspecified: Secondary | ICD-10-CM | POA: Diagnosis present

## 2014-09-13 DIAGNOSIS — L0390 Cellulitis, unspecified: Secondary | ICD-10-CM | POA: Diagnosis not present

## 2014-09-13 DIAGNOSIS — E785 Hyperlipidemia, unspecified: Secondary | ICD-10-CM | POA: Diagnosis present

## 2014-09-13 DIAGNOSIS — R5381 Other malaise: Secondary | ICD-10-CM | POA: Diagnosis present

## 2014-09-13 DIAGNOSIS — L03114 Cellulitis of left upper limb: Secondary | ICD-10-CM

## 2014-09-13 DIAGNOSIS — R404 Transient alteration of awareness: Secondary | ICD-10-CM | POA: Diagnosis not present

## 2014-09-13 DIAGNOSIS — L02414 Cutaneous abscess of left upper limb: Secondary | ICD-10-CM | POA: Diagnosis present

## 2014-09-13 DIAGNOSIS — E119 Type 2 diabetes mellitus without complications: Secondary | ICD-10-CM | POA: Diagnosis present

## 2014-09-13 DIAGNOSIS — D869 Sarcoidosis, unspecified: Secondary | ICD-10-CM | POA: Diagnosis present

## 2014-09-13 DIAGNOSIS — D8689 Sarcoidosis of other sites: Secondary | ICD-10-CM | POA: Diagnosis not present

## 2014-09-13 DIAGNOSIS — Z87891 Personal history of nicotine dependence: Secondary | ICD-10-CM

## 2014-09-13 DIAGNOSIS — E118 Type 2 diabetes mellitus with unspecified complications: Secondary | ICD-10-CM

## 2014-09-13 DIAGNOSIS — Z66 Do not resuscitate: Secondary | ICD-10-CM | POA: Diagnosis present

## 2014-09-13 DIAGNOSIS — M810 Age-related osteoporosis without current pathological fracture: Secondary | ICD-10-CM | POA: Diagnosis present

## 2014-09-13 DIAGNOSIS — Z885 Allergy status to narcotic agent status: Secondary | ICD-10-CM

## 2014-09-13 DIAGNOSIS — R531 Weakness: Secondary | ICD-10-CM | POA: Diagnosis not present

## 2014-09-13 DIAGNOSIS — Z681 Body mass index (BMI) 19 or less, adult: Secondary | ICD-10-CM | POA: Diagnosis not present

## 2014-09-13 DIAGNOSIS — Z888 Allergy status to other drugs, medicaments and biological substances status: Secondary | ICD-10-CM | POA: Diagnosis not present

## 2014-09-13 DIAGNOSIS — R6520 Severe sepsis without septic shock: Secondary | ICD-10-CM | POA: Diagnosis not present

## 2014-09-13 DIAGNOSIS — E440 Moderate protein-calorie malnutrition: Secondary | ICD-10-CM | POA: Diagnosis not present

## 2014-09-13 DIAGNOSIS — G894 Chronic pain syndrome: Secondary | ICD-10-CM | POA: Diagnosis present

## 2014-09-13 DIAGNOSIS — E876 Hypokalemia: Secondary | ICD-10-CM | POA: Diagnosis not present

## 2014-09-13 DIAGNOSIS — Z7952 Long term (current) use of systemic steroids: Secondary | ICD-10-CM | POA: Diagnosis not present

## 2014-09-13 DIAGNOSIS — N390 Urinary tract infection, site not specified: Secondary | ICD-10-CM | POA: Diagnosis present

## 2014-09-13 DIAGNOSIS — I10 Essential (primary) hypertension: Secondary | ICD-10-CM | POA: Diagnosis present

## 2014-09-13 DIAGNOSIS — M6281 Muscle weakness (generalized): Secondary | ICD-10-CM | POA: Diagnosis not present

## 2014-09-13 DIAGNOSIS — Z86711 Personal history of pulmonary embolism: Secondary | ICD-10-CM | POA: Diagnosis not present

## 2014-09-13 DIAGNOSIS — L0291 Cutaneous abscess, unspecified: Secondary | ICD-10-CM | POA: Diagnosis not present

## 2014-09-13 DIAGNOSIS — R41841 Cognitive communication deficit: Secondary | ICD-10-CM | POA: Diagnosis not present

## 2014-09-13 DIAGNOSIS — I4891 Unspecified atrial fibrillation: Secondary | ICD-10-CM

## 2014-09-13 DIAGNOSIS — Z9889 Other specified postprocedural states: Secondary | ICD-10-CM | POA: Diagnosis not present

## 2014-09-13 DIAGNOSIS — Z881 Allergy status to other antibiotic agents status: Secondary | ICD-10-CM | POA: Diagnosis not present

## 2014-09-13 DIAGNOSIS — I517 Cardiomegaly: Secondary | ICD-10-CM | POA: Diagnosis not present

## 2014-09-13 HISTORY — DX: Unspecified atrial fibrillation: I48.91

## 2014-09-13 HISTORY — PX: I&D EXTREMITY: SHX5045

## 2014-09-13 LAB — URINE MICROSCOPIC-ADD ON

## 2014-09-13 LAB — CBC WITH DIFFERENTIAL/PLATELET
BASOS ABS: 0 10*3/uL (ref 0.0–0.1)
Basophils Relative: 0 % (ref 0–1)
EOS PCT: 0 % (ref 0–5)
Eosinophils Absolute: 0 10*3/uL (ref 0.0–0.7)
HEMATOCRIT: 39.6 % (ref 36.0–46.0)
HEMOGLOBIN: 13.1 g/dL (ref 12.0–15.0)
Lymphocytes Relative: 5 % — ABNORMAL LOW (ref 12–46)
Lymphs Abs: 1.1 10*3/uL (ref 0.7–4.0)
MCH: 31.9 pg (ref 26.0–34.0)
MCHC: 33.1 g/dL (ref 30.0–36.0)
MCV: 96.4 fL (ref 78.0–100.0)
MONOS PCT: 8 % (ref 3–12)
Monocytes Absolute: 1.8 10*3/uL — ABNORMAL HIGH (ref 0.1–1.0)
NEUTROS PCT: 87 % — AB (ref 43–77)
Neutro Abs: 18.4 10*3/uL — ABNORMAL HIGH (ref 1.7–7.7)
Platelets: 175 10*3/uL (ref 150–400)
RBC: 4.11 MIL/uL (ref 3.87–5.11)
RDW: 12.4 % (ref 11.5–15.5)
WBC: 21.3 10*3/uL — ABNORMAL HIGH (ref 4.0–10.5)

## 2014-09-13 LAB — COMPREHENSIVE METABOLIC PANEL
ALK PHOS: 52 U/L (ref 39–117)
ALT: 12 U/L (ref 0–35)
ANION GAP: 8 (ref 5–15)
AST: 23 U/L (ref 0–37)
Albumin: 3 g/dL — ABNORMAL LOW (ref 3.5–5.2)
BILIRUBIN TOTAL: 1.3 mg/dL — AB (ref 0.3–1.2)
BUN: 13 mg/dL (ref 6–23)
CHLORIDE: 108 mmol/L (ref 96–112)
CO2: 23 mmol/L (ref 19–32)
CREATININE: 0.67 mg/dL (ref 0.50–1.10)
Calcium: 9.4 mg/dL (ref 8.4–10.5)
GFR calc Af Amer: >90 mL/min (ref >90)
GFR calc non Af Amer: 84 mL/min — ABNORMAL LOW (ref >90)
Glucose, Bld: 147 mg/dL — ABNORMAL HIGH (ref 70–99)
POTASSIUM: 3.7 mmol/L (ref 3.5–5.1)
Sodium: 139 mmol/L (ref 135–145)
TOTAL PROTEIN: 6.9 g/dL (ref 6.0–8.3)

## 2014-09-13 LAB — URINALYSIS, ROUTINE W REFLEX MICROSCOPIC
Bilirubin Urine: NEGATIVE
GLUCOSE, UA: 100 mg/dL — AB
Ketones, ur: 15 mg/dL — AB
Nitrite: POSITIVE — AB
Protein, ur: 30 mg/dL — AB
SPECIFIC GRAVITY, URINE: 1.02 (ref 1.005–1.030)
UROBILINOGEN UA: 1 mg/dL (ref 0.0–1.0)
pH: 6.5 (ref 5.0–8.0)

## 2014-09-13 LAB — TROPONIN I: Troponin I: 0.04 ng/mL — ABNORMAL HIGH (ref <0.031)

## 2014-09-13 LAB — TSH: TSH: 1.391 u[IU]/mL (ref 0.350–4.500)

## 2014-09-13 LAB — I-STAT CG4 LACTIC ACID, ED: Lactic Acid, Venous: 1.71 mmol/L (ref 0.5–2.0)

## 2014-09-13 SURGERY — IRRIGATION AND DEBRIDEMENT EXTREMITY
Anesthesia: General | Laterality: Left

## 2014-09-13 MED ORDER — SUCCINYLCHOLINE CHLORIDE 20 MG/ML IJ SOLN
INTRAMUSCULAR | Status: DC | PRN
  Administered 2014-09-13: 100 mg via INTRAVENOUS

## 2014-09-13 MED ORDER — INSULIN ASPART 100 UNIT/ML ~~LOC~~ SOLN: 0.0000 [IU] | Freq: Every day | SUBCUTANEOUS | Status: DC

## 2014-09-13 MED ORDER — SODIUM CHLORIDE 0.9 % IJ SOLN
INTRAMUSCULAR | Status: AC
  Filled 2014-09-13: qty 10

## 2014-09-13 MED ORDER — DILTIAZEM LOAD VIA INFUSION: 15.0000 mg | Freq: Once | INTRAVENOUS | Status: DC

## 2014-09-13 MED ORDER — EPHEDRINE SULFATE 50 MG/ML IJ SOLN
INTRAMUSCULAR | Status: AC
  Filled 2014-09-13: qty 1

## 2014-09-13 MED ORDER — INSULIN ASPART 100 UNIT/ML ~~LOC~~ SOLN
0.0000 [IU] | Freq: Three times a day (TID) | SUBCUTANEOUS | Status: DC
Start: 2014-09-13 — End: 2014-09-17
  Administered 2014-09-13: 2 [IU] via SUBCUTANEOUS
  Administered 2014-09-14: 3 [IU] via SUBCUTANEOUS
  Administered 2014-09-14: 5 [IU] via SUBCUTANEOUS
  Administered 2014-09-15: 2 [IU] via SUBCUTANEOUS
  Administered 2014-09-16: 5 [IU] via SUBCUTANEOUS
  Administered 2014-09-16: 3 [IU] via SUBCUTANEOUS

## 2014-09-13 MED ORDER — ALUM & MAG HYDROXIDE-SIMETH 200-200-20 MG/5ML PO SUSP: 30.0000 mL | Freq: Four times a day (QID) | ORAL | Status: DC | PRN

## 2014-09-13 MED ORDER — ONDANSETRON HCL 4 MG PO TABS
4.0000 mg | ORAL_TABLET | Freq: Four times a day (QID) | ORAL | Status: DC | PRN
Start: 2014-09-13 — End: 2014-09-17

## 2014-09-13 MED ORDER — FLUTICASONE PROPIONATE 50 MCG/ACT NA SUSP
1.0000 | Freq: Every day | NASAL | Status: DC | PRN
  Filled 2014-09-13: qty 16

## 2014-09-13 MED ORDER — ONDANSETRON HCL 4 MG/2ML IJ SOLN
INTRAMUSCULAR | Status: AC
  Filled 2014-09-13: qty 2

## 2014-09-13 MED ORDER — LIDOCAINE HCL (CARDIAC) 20 MG/ML IV SOLN
INTRAVENOUS | Status: AC
  Filled 2014-09-13: qty 5

## 2014-09-13 MED ORDER — METOPROLOL TARTRATE 25 MG PO TABS
25.0000 mg | ORAL_TABLET | Freq: Two times a day (BID) | ORAL | Status: DC
  Administered 2014-09-13 – 2014-09-17 (×8): 25 mg via ORAL
  Filled 2014-09-13 (×9): qty 1

## 2014-09-13 MED ORDER — SODIUM CHLORIDE 0.9 % IV SOLN
INTRAVENOUS | Status: DC
  Administered 2014-09-13 – 2014-09-15 (×5): via INTRAVENOUS

## 2014-09-13 MED ORDER — SODIUM CHLORIDE 0.9 % IV BOLUS (SEPSIS)
500.0000 mL | Freq: Once | INTRAVENOUS | Status: AC
  Administered 2014-09-13: 500 mL via INTRAVENOUS

## 2014-09-13 MED ORDER — VANCOMYCIN HCL IN DEXTROSE 1-5 GM/200ML-% IV SOLN: 1000.0000 mg | Freq: Once | INTRAVENOUS | Status: DC

## 2014-09-13 MED ORDER — MIDAZOLAM HCL 5 MG/5ML IJ SOLN
INTRAMUSCULAR | Status: DC | PRN
  Administered 2014-09-13: 1 mg via INTRAVENOUS

## 2014-09-13 MED ORDER — VANCOMYCIN HCL IN DEXTROSE 1-5 GM/200ML-% IV SOLN
1000.0000 mg | INTRAVENOUS | Status: DC
  Administered 2014-09-14 – 2014-09-16 (×3): 1000 mg via INTRAVENOUS
  Filled 2014-09-13 (×4): qty 200

## 2014-09-13 MED ORDER — ACETAMINOPHEN 325 MG PO TABS
650.0000 mg | ORAL_TABLET | Freq: Four times a day (QID) | ORAL | Status: DC | PRN
Start: 2014-09-13 — End: 2014-09-17
  Administered 2014-09-14: 650 mg via ORAL
  Administered 2014-09-14: 325 mg via ORAL
  Administered 2014-09-14: 650 mg via ORAL
  Filled 2014-09-13 (×3): qty 2

## 2014-09-13 MED ORDER — POLYETHYLENE GLYCOL 3350 17 G PO PACK
17.0000 g | PACK | Freq: Every day | ORAL | Status: DC | PRN
  Filled 2014-09-13 (×2): qty 1

## 2014-09-13 MED ORDER — PROPOFOL 10 MG/ML IV BOLUS
INTRAVENOUS | Status: AC
  Filled 2014-09-13: qty 20

## 2014-09-13 MED ORDER — VANCOMYCIN HCL IN DEXTROSE 1-5 GM/200ML-% IV SOLN
1000.0000 mg | Freq: Once | INTRAVENOUS | Status: AC
  Administered 2014-09-13: 1000 mg via INTRAVENOUS
  Filled 2014-09-13: qty 200

## 2014-09-13 MED ORDER — SUCCINYLCHOLINE CHLORIDE 20 MG/ML IJ SOLN
INTRAMUSCULAR | Status: AC
  Filled 2014-09-13: qty 1

## 2014-09-13 MED ORDER — RIVAROXABAN 20 MG PO TABS
20.0000 mg | ORAL_TABLET | Freq: Every day | ORAL | Status: DC
  Administered 2014-09-13 – 2014-09-16 (×4): 20 mg via ORAL
  Filled 2014-09-13 (×5): qty 1

## 2014-09-13 MED ORDER — ACETAMINOPHEN 650 MG RE SUPP
650.0000 mg | Freq: Four times a day (QID) | RECTAL | Status: DC | PRN
  Administered 2014-09-14: 650 mg via RECTAL
  Filled 2014-09-13 (×2): qty 1

## 2014-09-13 MED ORDER — ONDANSETRON HCL 4 MG/2ML IJ SOLN
4.0000 mg | Freq: Four times a day (QID) | INTRAMUSCULAR | Status: DC | PRN
Start: 2014-09-13 — End: 2014-09-17
  Filled 2014-09-13: qty 2

## 2014-09-13 MED ORDER — SODIUM CHLORIDE 0.9 % IJ SOLN
3.0000 mL | Freq: Two times a day (BID) | INTRAMUSCULAR | Status: DC
  Administered 2014-09-14 – 2014-09-17 (×3): 3 mL via INTRAVENOUS

## 2014-09-13 MED ORDER — PREDNISONE 5 MG PO TABS
5.0000 mg | ORAL_TABLET | Freq: Every day | ORAL | Status: DC
  Administered 2014-09-14 – 2014-09-17 (×4): 5 mg via ORAL
  Filled 2014-09-13 (×5): qty 1

## 2014-09-13 MED ORDER — PROPOFOL 10 MG/ML IV BOLUS
INTRAVENOUS | Status: DC | PRN
  Administered 2014-09-13: 120 mg via INTRAVENOUS

## 2014-09-13 MED ORDER — ENSURE COMPLETE PO LIQD
237.0000 mL | Freq: Two times a day (BID) | ORAL | Status: DC
  Administered 2014-09-14: 237 mL via ORAL

## 2014-09-13 MED ORDER — CHLORHEXIDINE GLUCONATE 4 % EX LIQD
60.0000 mL | Freq: Once | CUTANEOUS | Status: DC
  Filled 2014-09-13: qty 60

## 2014-09-13 MED ORDER — SODIUM CHLORIDE 0.9 % IR SOLN
Status: DC | PRN
  Administered 2014-09-13: 2000 mL

## 2014-09-13 MED ORDER — HYDROCODONE-ACETAMINOPHEN 5-325 MG PO TABS
1.0000 | ORAL_TABLET | ORAL | Status: DC | PRN
  Administered 2014-09-13: 1 via ORAL
  Administered 2014-09-14: 2 via ORAL
  Administered 2014-09-14: 1 via ORAL
  Administered 2014-09-15 – 2014-09-16 (×3): 2 via ORAL
  Administered 2014-09-16 (×2): 1 via ORAL
  Administered 2014-09-17: 2 via ORAL
  Administered 2014-09-17 (×2): 1 via ORAL
  Filled 2014-09-13: qty 2
  Filled 2014-09-13 (×4): qty 1
  Filled 2014-09-13 (×4): qty 2
  Filled 2014-09-13 (×2): qty 1

## 2014-09-13 MED ORDER — DOCUSATE SODIUM 100 MG PO CAPS
100.0000 mg | ORAL_CAPSULE | Freq: Two times a day (BID) | ORAL | Status: DC
Start: 2014-09-13 — End: 2014-09-17
  Administered 2014-09-14 – 2014-09-16 (×5): 100 mg via ORAL
  Filled 2014-09-13 (×9): qty 1

## 2014-09-13 MED ORDER — LIDOCAINE HCL (CARDIAC) 20 MG/ML IV SOLN
INTRAVENOUS | Status: DC | PRN
  Administered 2014-09-13: 50 mg via INTRAVENOUS

## 2014-09-13 MED ORDER — EPHEDRINE SULFATE 50 MG/ML IJ SOLN
INTRAMUSCULAR | Status: DC | PRN
  Administered 2014-09-13: 10 mg via INTRAVENOUS

## 2014-09-13 MED ORDER — ONDANSETRON HCL 4 MG/2ML IJ SOLN
INTRAMUSCULAR | Status: DC | PRN
  Administered 2014-09-13: 4 mg via INTRAVENOUS

## 2014-09-13 MED ORDER — MIDAZOLAM HCL 2 MG/2ML IJ SOLN
INTRAMUSCULAR | Status: AC
  Filled 2014-09-13: qty 2

## 2014-09-13 MED ORDER — MORPHINE SULFATE 2 MG/ML IJ SOLN
1.0000 mg | INTRAMUSCULAR | Status: DC | PRN
  Administered 2014-09-14 – 2014-09-17 (×2): 2 mg via INTRAVENOUS
  Filled 2014-09-13 (×2): qty 1

## 2014-09-13 MED ORDER — DILTIAZEM HCL 100 MG IV SOLR: 5.0000 mg/h | INTRAVENOUS | Status: DC

## 2014-09-13 MED ORDER — FENTANYL CITRATE 0.05 MG/ML IJ SOLN
INTRAMUSCULAR | Status: AC
  Filled 2014-09-13: qty 5

## 2014-09-13 MED ORDER — FENTANYL CITRATE 0.05 MG/ML IJ SOLN
INTRAMUSCULAR | Status: DC | PRN
  Administered 2014-09-13: 50 ug via INTRAVENOUS

## 2014-09-13 MED ORDER — PHENYLEPHRINE HCL 10 MG/ML IJ SOLN
INTRAMUSCULAR | Status: DC | PRN
  Administered 2014-09-13 (×3): 80 ug via INTRAVENOUS

## 2014-09-13 SURGICAL SUPPLY — 55 items
BANDAGE ELASTIC 3 VELCRO ST LF (GAUZE/BANDAGES/DRESSINGS) ×3 IMPLANT
BANDAGE ELASTIC 4 VELCRO ST LF (GAUZE/BANDAGES/DRESSINGS) ×3 IMPLANT
BNDG CMPR 9X4 STRL LF SNTH (GAUZE/BANDAGES/DRESSINGS) ×1
BNDG COHESIVE 1X5 TAN STRL LF (GAUZE/BANDAGES/DRESSINGS) IMPLANT
BNDG CONFORM 2 STRL LF (GAUZE/BANDAGES/DRESSINGS) IMPLANT
BNDG ESMARK 4X9 LF (GAUZE/BANDAGES/DRESSINGS) ×3 IMPLANT
BNDG GAUZE ELAST 4 BULKY (GAUZE/BANDAGES/DRESSINGS) ×3 IMPLANT
CORDS BIPOLAR (ELECTRODE) ×3 IMPLANT
COVER SURGICAL LIGHT HANDLE (MISCELLANEOUS) ×3 IMPLANT
CUFF TOURNIQUET SINGLE 18IN (TOURNIQUET CUFF) ×3 IMPLANT
CUFF TOURNIQUET SINGLE 24IN (TOURNIQUET CUFF) IMPLANT
DRAIN PENROSE 1/4X12 LTX STRL (WOUND CARE) IMPLANT
DRAPE SURG 17X23 STRL (DRAPES) ×3 IMPLANT
DRSG ADAPTIC 3X8 NADH LF (GAUZE/BANDAGES/DRESSINGS) ×3 IMPLANT
ELECT REM PT RETURN 9FT ADLT (ELECTROSURGICAL)
ELECTRODE REM PT RTRN 9FT ADLT (ELECTROSURGICAL) IMPLANT
GAUZE SPONGE 4X4 12PLY STRL (GAUZE/BANDAGES/DRESSINGS) ×3 IMPLANT
GAUZE XEROFORM 1X8 LF (GAUZE/BANDAGES/DRESSINGS) ×3 IMPLANT
GAUZE XEROFORM 5X9 LF (GAUZE/BANDAGES/DRESSINGS) IMPLANT
GLOVE BIOGEL PI IND STRL 8.5 (GLOVE) ×1 IMPLANT
GLOVE BIOGEL PI INDICATOR 8.5 (GLOVE) ×2
GLOVE SURG ORTHO 8.0 STRL STRW (GLOVE) ×3 IMPLANT
GOWN STRL REUS W/ TWL LRG LVL3 (GOWN DISPOSABLE) ×3 IMPLANT
GOWN STRL REUS W/ TWL XL LVL3 (GOWN DISPOSABLE) ×1 IMPLANT
GOWN STRL REUS W/TWL LRG LVL3 (GOWN DISPOSABLE) ×9
GOWN STRL REUS W/TWL XL LVL3 (GOWN DISPOSABLE) ×3
HANDPIECE INTERPULSE COAX TIP (DISPOSABLE)
KIT BASIN OR (CUSTOM PROCEDURE TRAY) ×3 IMPLANT
KIT ROOM TURNOVER OR (KITS) ×3 IMPLANT
MANIFOLD NEPTUNE II (INSTRUMENTS) ×3 IMPLANT
NDL HYPO 25GX1X1/2 BEV (NEEDLE) IMPLANT
NEEDLE HYPO 25GX1X1/2 BEV (NEEDLE) IMPLANT
NS IRRIG 1000ML POUR BTL (IV SOLUTION) ×3 IMPLANT
PACK ORTHO EXTREMITY (CUSTOM PROCEDURE TRAY) ×3 IMPLANT
PAD ARMBOARD 7.5X6 YLW CONV (MISCELLANEOUS) ×6 IMPLANT
PAD CAST 4YDX4 CTTN HI CHSV (CAST SUPPLIES) ×1 IMPLANT
PADDING CAST COTTON 4X4 STRL (CAST SUPPLIES) ×3
SET HNDPC FAN SPRY TIP SCT (DISPOSABLE) IMPLANT
SOAP 2 % CHG 4 OZ (WOUND CARE) ×3 IMPLANT
SPONGE GAUZE 4X4 12PLY STER LF (GAUZE/BANDAGES/DRESSINGS) ×2 IMPLANT
SPONGE LAP 18X18 X RAY DECT (DISPOSABLE) ×3 IMPLANT
SPONGE LAP 4X18 X RAY DECT (DISPOSABLE) ×3 IMPLANT
SUCTION FRAZIER TIP 10 FR DISP (SUCTIONS) ×3 IMPLANT
SUT ETHILON 4 0 PS 2 18 (SUTURE) IMPLANT
SUT ETHILON 5 0 P 3 18 (SUTURE) ×2
SUT NYLON ETHILON 5-0 P-3 1X18 (SUTURE) ×1 IMPLANT
SYR CONTROL 10ML LL (SYRINGE) IMPLANT
TOWEL OR 17X24 6PK STRL BLUE (TOWEL DISPOSABLE) ×3 IMPLANT
TOWEL OR 17X26 10 PK STRL BLUE (TOWEL DISPOSABLE) ×3 IMPLANT
TUBE ANAEROBIC SPECIMEN COL (MISCELLANEOUS) IMPLANT
TUBE CONNECTING 12'X1/4 (SUCTIONS) ×1
TUBE CONNECTING 12X1/4 (SUCTIONS) ×2 IMPLANT
UNDERPAD 30X30 INCONTINENT (UNDERPADS AND DIAPERS) ×3 IMPLANT
WATER STERILE IRR 1000ML POUR (IV SOLUTION) ×3 IMPLANT
YANKAUER SUCT BULB TIP NO VENT (SUCTIONS) ×3 IMPLANT

## 2014-09-13 NOTE — Anesthesia Procedure Notes (Signed)
Procedure Name: Intubation Date/Time: 09/13/2014 9:16 PM Performed by: Mosie Epstein Pre-anesthesia Checklist: Patient identified, Timeout performed, Emergency Drugs available, Suction available and Patient being monitored Patient Re-evaluated:Patient Re-evaluated prior to inductionOxygen Delivery Method: Circle system utilized Preoxygenation: Pre-oxygenation with 100% oxygen Intubation Type: IV induction, Rapid sequence and Cricoid Pressure applied Ventilation: Mask ventilation without difficulty Laryngoscope Size: Mac and 3 Grade View: Grade I Tube type: Oral Tube size: 7.0 mm Number of attempts: 1 Airway Equipment and Method: Stylet Placement Confirmation: ETT inserted through vocal cords under direct vision,  positive ETCO2 and breath sounds checked- equal and bilateral Secured at: 20 cm Tube secured with: Tape Dental Injury: Teeth and Oropharynx as per pre-operative assessment

## 2014-09-13 NOTE — ED Provider Notes (Signed)
CSN: [id number]     Arrival date & time 09/13/14  2536 History   First MD Initiated Contact with Patient 09/13/14 0957     Chief Complaint  Patient presents with  . DVT  . Weakness  . Atrial Fibrillation     (Consider location/radiation/quality/duration/timing/severity/associated sxs/prior Treatment) Patient is a 74 y.o. female presenting with weakness and atrial fibrillation. The history is provided by the patient.  Weakness This is a recurrent problem. Pertinent negatives include no chest pain, no abdominal pain and no shortness of breath.  Atrial Fibrillation Pertinent negatives include no chest pain, no abdominal pain and no shortness of breath.   patient states that she has been weak in her legs over the last 3 days. States she has a history of same normally go away. States she's always weak on the right side but she has been having difficulty getting out of chairs. She states she also has a problem with her left forearm. There appears be an infection there. Patient does not know she's had fevers. No chest pain. No difficulty breathing. No abdominal pain.  Past Medical History  Diagnosis Date  . Sarcoidosis   . Hiatal hernia   . Diabetes mellitus without complication   . Osteoporosis   . DVT (deep venous thrombosis)   . Pulmonary emboli    Past Surgical History  Procedure Laterality Date  . Abdominal hysterectomy    . Joint replacement      toe  . Lung lobectomy Left     Lower   Family History  Problem Relation Age of Onset  . Heart disease Mother   . Hypertension Mother   . Cancer Father     pancratic  . Cancer - Other Sister    History  Substance Use Topics  . Smoking status: Former Smoker    Types: Cigarettes    Quit date: 12/31/2000  . Smokeless tobacco: Not on file  . Alcohol Use: No   OB History    No data available     Review of Systems  Constitutional: Positive for appetite change. Negative for activity change.  Respiratory: Negative for shortness  of breath.   Cardiovascular: Negative for chest pain.  Gastrointestinal: Negative for abdominal pain.  Musculoskeletal: Negative for back pain.  Skin: Positive for color change and wound.  Neurological: Positive for weakness. Negative for numbness.      Allergies  Advil; Azo; Indocin; Keflex; Loratadine; and Percocet  Home Medications   Prior to Admission medications   Medication Sig Start Date End Date Taking? Authorizing Provider  calcium-vitamin D (OSCAL 500/200 D-3) 500-200 MG-UNIT per tablet Take 1 tablet by mouth daily with breakfast.   Yes Historical Provider, MD  glimepiride (AMARYL) 2 MG tablet TAKE ONE TABLET BY MOUTH ONE TIME DAILY 09/10/14  Yes Mary-Margaret Hassell Done, FNP  HYDROcodone-acetaminophen (NORCO/VICODIN) 5-325 MG per tablet Take 1-2 tablets by mouth every 4 (four) hours as needed for moderate pain.   Yes Historical Provider, MD  metFORMIN (GLUCOPHAGE) 500 MG tablet Take 1 tablet (500 mg total) by mouth daily. 04/28/14  Yes Mary-Margaret Hassell Done, FNP  metoprolol tartrate (LOPRESSOR) 25 MG tablet TAKE ONE TABLET BY MOUTH TWICE DAILY 04/19/14  Yes Chipper Herb, MD  mometasone (NASONEX) 50 MCG/ACT nasal spray 2 sprays per nostril qd Patient taking differently: Place 2 sprays into the nose daily as needed (allergies). 2 sprays per nostril qd 12/21/13  Yes Lysbeth Penner, FNP  predniSONE (DELTASONE) 5 MG tablet Take 5 mg by mouth daily  with breakfast.    Yes Historical Provider, MD  rivaroxaban (XARELTO) 20 MG TABS tablet Take 1 tablet (20 mg total) by mouth daily with supper. 06/15/14  Yes Mary-Margaret Hassell Done, FNP  glimepiride (AMARYL) 2 MG tablet Take 1 tablet (2 mg total) by mouth daily before breakfast. Patient not taking: Reported on 09/13/2014 10/12/13   Mary-Margaret Hassell Done, FNP  glimepiride (AMARYL) 2 MG tablet TAKE ONE TABLET BY MOUTH ONCE DAILY Patient not taking: Reported on 08/13/2014 05/19/14   Mary-Margaret Hassell Done, FNP   BP 125/49 mmHg  Pulse 98  Temp(Src) 99.7  F (37.6 C) (Rectal)  Resp 21  Ht 5\' 6"  (1.676 m)  Wt 119 lb (53.978 kg)  BMI 19.22 kg/m2  SpO2 98% Physical Exam  Constitutional: She appears well-developed.  Neck: Neck supple.  Cardiovascular:  Irregular tachycardia  Pulmonary/Chest: Effort normal.  Abdominal: Soft. There is no tenderness.  Musculoskeletal: Normal range of motion. She exhibits no edema.  Neurological: She is alert.  Skin: There is erythema.  Erythema swelling of left forearm. There is a central area of an ulcer and surrounding purulence. No fluctuance. Good pulse in left wrist.    ED Course  Procedures (including critical care time) Labs Review Labs Reviewed  COMPREHENSIVE METABOLIC PANEL - Abnormal; Notable for the following:    Glucose, Bld 147 (*)    Albumin 3.0 (*)    Total Bilirubin 1.3 (*)    GFR calc non Af Amer 84 (*)    All other components within normal limits  CBC WITH DIFFERENTIAL/PLATELET - Abnormal; Notable for the following:    WBC 21.3 (*)    Neutrophils Relative % 87 (*)    Neutro Abs 18.4 (*)    Lymphocytes Relative 5 (*)    Monocytes Absolute 1.8 (*)    All other components within normal limits  TROPONIN I - Abnormal; Notable for the following:    Troponin I 0.04 (*)    All other components within normal limits  CULTURE, BLOOD (ROUTINE X 2)  CULTURE, BLOOD (ROUTINE X 2)  TSH  HEMOGLOBIN A1C  I-STAT CG4 LACTIC ACID, ED    Imaging Review Dg Chest Portable 1 View  09/13/2014   CLINICAL DATA:  Left lower lobectomy.  Sarcoid.  EXAM: PORTABLE CHEST - 1 VIEW  COMPARISON:  08/13/2014  FINDINGS: Mild cardiomegaly. Left lower lung atelectasis or infiltrate slightly increased since prior study. Cannot exclude small left effusion. No confluent opacity on the right. No acute bony abnormality. Sclerotic area within the right humeral head is stable.  IMPRESSION: No active disease.   Electronically Signed   By: Rolm Baptise M.D.   On: 09/13/2014 11:12     EKG Interpretation   Date/Time:   Monday September 13 2014 10:01:41 EST Ventricular Rate:  118 PR Interval:    QRS Duration: 125 QT Interval:  352 QTC Calculation: 493 R Axis:   -71 Text Interpretation:  Atrial fibrillation Left bundle branch block also  sinus beats Confirmed by Nakisha Chai  MD, Ovid Curd [phone]) on 09/13/2014  10:14:14 AM      MDM   Final diagnoses:  Cellulitis of forearm, left  Atrial fibrillation with RVR    Patient with cellulitis of her left forearm. White count is elevated. Does have in A. fib RVR. Likely secondary to the infection. IV fluid bolus and antibiotics given. While in the ER but admitted the patient converted back to sinus rhythm. Does not appear to be a drainable abscess at this time.    Ovid Curd  Rita Ohara, MD 09/13/14 [phone]

## 2014-09-13 NOTE — Consult Note (Signed)
Reason for Consult:left forearm abscess Referring Physician: Dr. Fuller [name] is an 74 y.o. female.  RAQ:TMAUQ Elizabeth Drake is a 74 y.o. female, with history of diabetes, hypothyroidism, hypertension, sarcoidosis on chronic prednisone and overactive bladder. She also has significant physical deconditioning and complains repeatedly of a normalized as well as recurrent lower extremity weakness. She was recently seen in ER at the beginning of January for complaints of generalized weakness but because of significant findings she was discharged back to home. She returns again with generalized weakness. Upon evaluation she was found to be a new atrial fibrillation with RVR. She reports that she has been weak in her legs for at least 3 days. She also was complaining of pain in the left arm. She was found to have significant erythema and likely cellulitis of the left forearm. Her white count was 21,300. Blood cultures were obtained in the ER and she was started on vancomycin. When we initially examined her she was still atrial fibrillation with rates peaking up into the 120s so IV Cardizem infusion was ordered and cardiology was consulted. Later she converted back to sinus rhythm/sinus tachycardia. She was admitted with elevated white count and concern for deep space abscess to left forearm Past Medical History  Diagnosis Date  . Sarcoidosis   . Hiatal hernia   . Diabetes mellitus without complication   . Osteoporosis   . DVT (deep venous thrombosis)   . Pulmonary emboli     Past Surgical History  Procedure Laterality Date  . Abdominal hysterectomy    . Joint replacement      toe  . Lung lobectomy Left     Lower    Family History  Problem Relation Age of Onset  . Heart disease Mother   . Hypertension Mother   . Cancer Father     pancratic  . Cancer - Other Sister     Social History:  reports that she quit smoking about 13 years ago. Her smoking use included Cigarettes. She does not have  any smokeless tobacco history on file. She reports that she does not drink alcohol or use illicit drugs.  Allergies:  Allergies  Allergen Reactions  . Advil [Ibuprofen] Shortness Of Breath and Swelling  . Azo [Phenazopyridine] Other (See Comments)    Unknown reaction  . Indocin [Indomethacin] Other (See Comments)    Unknown reaction  . Keflex [Cephalexin]   . Loratadine Other (See Comments)    unknown  . Percocet [Oxycodone-Acetaminophen] Itching    Medications: I have reviewed the patient's current medications.  Results for orders placed or performed during the hospital encounter of 09/13/14 (from the past 48 hour(s))  Comprehensive metabolic panel     Status: Abnormal   Collection Time: 09/13/14 11:10 AM  Result Value Ref Range   Sodium 139 135 - 145 mmol/L   Potassium 3.7 3.5 - 5.1 mmol/L   Chloride 108 96 - 112 mmol/L   CO2 23 19 - 32 mmol/L   Glucose, Bld 147 (H) 70 - 99 mg/dL   BUN 13 6 - 23 mg/dL   Creatinine, Ser 0.67 0.50 - 1.10 mg/dL   Calcium 9.4 8.4 - 10.5 mg/dL   Total Protein 6.9 6.0 - 8.3 g/dL   Albumin 3.0 (L) 3.5 - 5.2 g/dL   AST 23 0 - 37 U/L   ALT 12 0 - 35 U/L   Alkaline Phosphatase 52 39 - 117 U/L   Total Bilirubin 1.3 (H) 0.3 - 1.2 mg/dL  GFR calc non Af Amer 84 (L) >90 mL/min   GFR calc Af Amer >90 >90 mL/min    Comment: (NOTE) The eGFR has been calculated using the CKD EPI equation. This calculation has not been validated in all clinical situations. eGFR's persistently <90 mL/min signify possible Chronic Kidney Disease.    Anion gap 8 5 - 15  TSH     Status: None   Collection Time: 09/13/14 11:10 AM  Result Value Ref Range   TSH 1.391 0.350 - 4.500 uIU/mL  CBC with Differential     Status: Abnormal   Collection Time: 09/13/14 11:10 AM  Result Value Ref Range   WBC 21.3 (H) 4.0 - 10.5 K/uL   RBC 4.11 3.87 - 5.11 MIL/uL   Hemoglobin 13.1 12.0 - 15.0 g/dL   HCT 39.6 36.0 - 46.0 %   MCV 96.4 78.0 - 100.0 fL   MCH 31.9 26.0 - 34.0 pg    MCHC 33.1 30.0 - 36.0 g/dL   RDW 12.4 11.5 - 15.5 %   Platelets 175 150 - 400 K/uL   Neutrophils Relative % 87 (H) 43 - 77 %   Neutro Abs 18.4 (H) 1.7 - 7.7 K/uL   Lymphocytes Relative 5 (L) 12 - 46 %   Lymphs Abs 1.1 0.7 - 4.0 K/uL   Monocytes Relative 8 3 - 12 %   Monocytes Absolute 1.8 (H) 0.1 - 1.0 K/uL   Eosinophils Relative 0 0 - 5 %   Eosinophils Absolute 0.0 0.0 - 0.7 K/uL   Basophils Relative 0 0 - 1 %   Basophils Absolute 0.0 0.0 - 0.1 K/uL  Troponin I     Status: Abnormal   Collection Time: 09/13/14 11:10 AM  Result Value Ref Range   Troponin I 0.04 (H) <0.031 ng/mL    Comment:        PERSISTENTLY INCREASED TROPONIN VALUES IN THE RANGE OF 0.04-0.49 ng/mL CAN BE SEEN IN:       -UNSTABLE ANGINA       -CONGESTIVE HEART FAILURE       -MYOCARDITIS       -CHEST TRAUMA       -ARRYHTHMIAS       -LATE PRESENTING MYOCARDIAL INFARCTION       -COPD   CLINICAL FOLLOW-UP RECOMMENDED.   I-Stat CG4 Lactic Acid, ED     Status: None   Collection Time: 09/13/14 11:27 AM  Result Value Ref Range   Lactic Acid, Venous 1.71 0.5 - 2.0 mmol/L  Urinalysis, Routine w reflex microscopic     Status: Abnormal   Collection Time: 09/13/14  6:45 PM  Result Value Ref Range   Color, Urine YELLOW YELLOW   APPearance CLOUDY (A) CLEAR   Specific Gravity, Urine 1.020 1.005 - 1.030   pH 6.5 5.0 - 8.0   Glucose, UA 100 (A) NEGATIVE mg/dL   Hgb urine dipstick LARGE (A) NEGATIVE   Bilirubin Urine NEGATIVE NEGATIVE   Ketones, ur 15 (A) NEGATIVE mg/dL   Protein, ur 30 (A) NEGATIVE mg/dL   Urobilinogen, UA 1.0 0.0 - 1.0 mg/dL   Nitrite POSITIVE (A) NEGATIVE   Leukocytes, UA MODERATE (A) NEGATIVE  Urine microscopic-add on     Status: Abnormal   Collection Time: 09/13/14  6:45 PM  Result Value Ref Range   Squamous Epithelial / LPF FEW (A) RARE   WBC, UA 11-20 <3 WBC/hpf   RBC / HPF 7-10 <3 RBC/hpf   Bacteria, UA MANY (A) RARE   Casts  HYALINE CASTS (A) NEGATIVE   Crystals CA OXALATE CRYSTALS (A)  NEGATIVE   Urine-Other MUCOUS PRESENT     Dg Chest Portable 1 View  09/13/2014   CLINICAL DATA:  Left lower lobectomy.  Sarcoid.  EXAM: PORTABLE CHEST - 1 VIEW  COMPARISON:  08/13/2014  FINDINGS: Mild cardiomegaly. Left lower lung atelectasis or infiltrate slightly increased since prior study. Cannot exclude small left effusion. No confluent opacity on the right. No acute bony abnormality. Sclerotic area within the right humeral head is stable.  IMPRESSION: No active disease.   Electronically Signed   By: Rolm Baptise M.D.   On: 09/13/2014 11:12    ROS AS NOTED IN CHART AND REVIEWED IN NOTES Blood pressure 135/46, pulse 98, temperature 99.3 F (37.4 C), temperature source Oral, resp. rate 21, height _0  (1.676 m), weight 53.978 kg (119 lb), SpO2 98 %. Physical Exam General Appearance:  Alert, cooperative, no distress, appears stated age  Head:  Normocephalic, without obvious abnormality, atraumatic  Eyes:  Pupils equal, conjunctiva/corneas clear,         Throat: Lips, mucosa, and tongue normal; teeth and gums normal  Neck: No visible masses     Lungs:   respirations unlabored  Chest Wall:  No tenderness or deformity  Heart:  Regular rate and rhythm,  Abdomen:   Soft, non-tender,         Extremities: LEFT FOREARM: MIDFOREARM INDURATED AND FLUCTUANT AREA OVER THE ULNAR ASPECT ABLE TO MAKE FULL FIST FINGERS  WARM WELL PERFUSED NVI LEFT HAND   Pulses: 2+ and symmetric  Skin: Skin color, texture, turgor normal, no rashes or lesions     Neurologic: Normal   Assessment/Plan: LEFT FOREARM ABSCESS   LEFT FOREARM ABSCESS INCISION AND DRAINAGE  PT SEEN/EXAMINED AT BEDSIDE R/B/A DISCUSSED WITH PT IN OFFICE.  PT VOICED UNDERSTANDING OF PLAN CONSENT SIGNED DAY OF SURGERY PT SEEN AND EXAMINED PRIOR TO OPERATIVE PROCEDURE/DAY OF SURGERY SITE MARKED. QUESTIONS ANSWERED [place] W 09/13/2014, 8:48 PM

## 2014-09-13 NOTE — Progress Notes (Signed)
Patient subsequently converted back to sinus rhythm/sinus tachycardia. Primary rationale for consulting cardiology was to see if patient would be a candidate for cardioversion for RVR. Patient anticoagulated prior to admission. No indication to consult cardiology at this time. I discussed this with Dr. Dorris Carnes who agrees.  Erin Hearing, ANP

## 2014-09-13 NOTE — Progress Notes (Signed)
ANTIBIOTIC CONSULT NOTE - INITIAL  Pharmacy Consult:  Vancomycin Indication:  Sepsis  Allergies  Allergen Reactions  . Advil [Ibuprofen] Shortness Of Breath and Swelling  . Azo [Phenazopyridine] Other (See Comments)    Unknown reaction  . Indocin [Indomethacin] Other (See Comments)    Unknown reaction  . Keflex [Cephalexin]   . Loratadine Other (See Comments)    unknown  . Percocet [Oxycodone-Acetaminophen] Itching    Patient Measurements: Height: 5\' 6"  (167.6 cm) Weight: 119 lb (53.978 kg) IBW/kg (Calculated) : 59.3  Vital Signs: Temp: 99.7 F (37.6 C) (02/08 1015) Temp Source: Rectal (02/08 1015) BP: 127/78 mmHg (02/08 1030) Pulse Rate: 78 (02/08 1030)  Labs: No results for input(s): WBC, HGB, PLT, LABCREA, CREATININE in the last 72 hours. CrCl cannot be calculated (Patient has no serum creatinine result on file.). No results for input(s): VANCOTROUGH, VANCOPEAK, VANCORANDOM, GENTTROUGH, GENTPEAK, GENTRANDOM, TOBRATROUGH, TOBRAPEAK, TOBRARND, AMIKACINPEAK, AMIKACINTROU, AMIKACIN in the last 72 hours.   Microbiology: No results found for this or any previous visit (from the past 720 hour(s)).  Medical History: Past Medical History  Diagnosis Date  . Sarcoidosis   . Hiatal hernia   . Diabetes mellitus without complication   . Osteoporosis   . DVT (deep venous thrombosis)   . Pulmonary emboli       Assessment: 56 YOF presented with complaint of weakness for two days PTA.  Pharmacy consulted to manage vancomycin for sepsis and cellulitis of forearm.  Baseline labs reviewed.   Goal of Therapy:  Vancomycin trough level 15-20 mcg/ml   Plan:  - Vanc 1gm IV Q24H, start tomorrow  - F/U clinical progress, vanc trough as indicated    Carrieann Spielberg D. Mina Marble, PharmD, BCPS Pager:  [phone] 09/13/2014, 12:26 PM

## 2014-09-13 NOTE — Brief Op Note (Signed)
09/13/2014  8:53 PM  PATIENT:  Elizabeth Drake  74 y.o. female  PRE-OPERATIVE DIAGNOSIS:  Infected left forearm  POST-OPERATIVE DIAGNOSIS:  SAME  PROCEDURE:  Procedure(s): IRRIGATION AND DEBRIDEMENT EXTREMITY (Left)  SURGEON:  Surgeon(s) and Role:    * Linna Hoff, MD - Primary  PHYSICIAN ASSISTANT:   ASSISTANTS: none   ANESTHESIA:   general  EBL:     BLOOD ADMINISTERED:none  DRAINS: none   LOCAL MEDICATIONS USED:  NONE  SPECIMEN:  No Specimen  DISPOSITION OF SPECIMEN:  N/A  COUNTS:  YES  TOURNIQUET:    DICTATION: .Other Dictation: Dictation Number [id number]  PLAN OF CARE: Admit to inpatient   PATIENT DISPOSITION:  PACU - hemodynamically stable.   Delay start of Pharmacological VTE agent (>24hrs) due to surgical blood loss or risk of bleeding: not applicable

## 2014-09-13 NOTE — ED Notes (Signed)
Admitting physician at bedside

## 2014-09-13 NOTE — H&P (Signed)
[facility] History and Physical                                                                                    Elizabeth Drake, is a 74 y.o. female  MRN: [id number]   DOB - [birth year]  Admit Date - 09/13/2014  Outpatient Primary MD for the patient is Chevis Pretty, FNP  With History of -  Past Medical History  Diagnosis Date  . Sarcoidosis   . Hiatal hernia   . Diabetes mellitus without complication   . Osteoporosis   . DVT (deep venous thrombosis)   . Pulmonary emboli       Past Surgical History  Procedure Laterality Date  . Abdominal hysterectomy    . Joint replacement      toe  . Lung lobectomy Left     Lower    in for   Chief Complaint  Patient presents with  . DVT  . Weakness  . Atrial Fibrillation     HPI Elizabeth Drake  is a 74 y.o. female, with history of diabetes, hypothyroidism, hypertension, sarcoidosis on chronic prednisone and overactive bladder. She also has significant physical deconditioning and complains repeatedly of a normalized as well as recurrent lower extremity weakness. She was recently seen in ER at the beginning of January for complaints of generalized weakness but because of significant findings she was discharged back to home. She returns again with generalized weakness. Upon evaluation she was found to be a new atrial fibrillation with RVR. She reports that she has been weak in her legs for at least 3 days. She also was complaining of pain in the left arm. She was found to have significant erythema and likely cellulitis of the left forearm. Her white count was 21,300. Blood cultures were obtained in the ER and she was started on vancomycin. When we initially examined her she was still atrial fibrillation with rates peaking up into the 120s so IV Cardizem infusion was ordered and cardiology was consulted. Later she converted back to sinus rhythm/sinus tachycardia.  Review of Systems   In addition to the HPI above: No subjective or  objective Fever,chills, myalgias or other constitutional symptoms No Headache, changes with Vision or hearing, new weakness, tingling, numbness in any extremity, No problems swallowing food or Liquids, indigestion/reflux No Chest pain, Cough or Shortness of Breath, awareness of palpitations, orthopnea or DOE No Abdominal pain, N/V; no melena or hematochezia, no dark tarry stools, Bowel movements are regular, No dysuria, hematuria or flank pain New redness with increasing swelling left forearm for at least 3 days, no recollection of any definitive trauma or awareness of insect bite No new joints pains-aches No recent weight gain or loss No polyuria, polydypsia or polyphagia,  *A full 10 point Review of Systems was done, except as stated above, all other Review of Systems were negative.  Social History History  Substance Use Topics  . Smoking status: Former Smoker    Types: Cigarettes    Quit date: 12/31/2000  . Smokeless tobacco: Not on file  . Alcohol Use: No    Family History Family History  Problem Relation Age of Onset  . Heart  disease Mother   . Hypertension Mother   . Cancer Father     pancratic  . Cancer - Other Sister     Prior to Admission medications   Medication Sig Start Date End Date Taking? Authorizing Provider  calcium-vitamin D (OSCAL 500/200 D-3) 500-200 MG-UNIT per tablet Take 1 tablet by mouth daily with breakfast.   Yes Historical Provider, MD  glimepiride (AMARYL) 2 MG tablet TAKE ONE TABLET BY MOUTH ONE TIME DAILY 09/10/14  Yes Mary-Margaret Hassell Done, FNP  HYDROcodone-acetaminophen (NORCO/VICODIN) 5-325 MG per tablet Take 1-2 tablets by mouth every 4 (four) hours as needed for moderate pain.   Yes Historical Provider, MD  metFORMIN (GLUCOPHAGE) 500 MG tablet Take 1 tablet (500 mg total) by mouth daily. 04/28/14  Yes Mary-Margaret Hassell Done, FNP  metoprolol tartrate (LOPRESSOR) 25 MG tablet TAKE ONE TABLET BY MOUTH TWICE DAILY 04/19/14  Yes Chipper Herb, MD    mometasone (NASONEX) 50 MCG/ACT nasal spray 2 sprays per nostril qd Patient taking differently: Place 2 sprays into the nose daily as needed (allergies). 2 sprays per nostril qd 12/21/13  Yes Lysbeth Penner, FNP  predniSONE (DELTASONE) 5 MG tablet Take 5 mg by mouth daily with breakfast.    Yes Historical Provider, MD  rivaroxaban (XARELTO) 20 MG TABS tablet Take 1 tablet (20 mg total) by mouth daily with supper. 06/15/14  Yes Mary-Margaret Hassell Done, FNP  glimepiride (AMARYL) 2 MG tablet Take 1 tablet (2 mg total) by mouth daily before breakfast. Patient not taking: Reported on 09/13/2014 10/12/13   Mary-Margaret Hassell Done, FNP  glimepiride (AMARYL) 2 MG tablet TAKE ONE TABLET BY MOUTH ONCE DAILY Patient not taking: Reported on 08/13/2014 05/19/14   Mary-Margaret Hassell Done, FNP    Allergies  Allergen Reactions  . Advil [Ibuprofen] Shortness Of Breath and Swelling  . Azo [Phenazopyridine] Other (See Comments)    Unknown reaction  . Indocin [Indomethacin] Other (See Comments)    Unknown reaction  . Keflex [Cephalexin]   . Loratadine Other (See Comments)    unknown  . Percocet [Oxycodone-Acetaminophen] Itching    Physical Exam  Vitals  Blood pressure 129/78, pulse 107, temperature 99.7 F (37.6 C), temperature source Rectal, resp. rate 20, height 5\' 6"  (1.676 m), weight 119 lb (53.978 kg), SpO2 92 %.   General:  In no acute distress, appears healthy and well nourished  Psych:  Normal affect, Denies Suicidal or Homicidal ideations, Awake Alert, Oriented X 3. Speech and thought patterns are clear and appropriate, no apparent short term memory deficits  Neuro:   No focal neurological deficits, CN II through XII intact, Strength 5/5 all 4 extremities, Sensation intact all 4 extremities.  ENT:  Ears and Eyes appear Normal, Conjunctivae clear, PER. Moist oral mucosa without erythema or exudates.  Neck:  Supple, No lymphadenopathy appreciated  Respiratory:  Symmetrical chest wall movement, Good  air movement bilaterally, CTAB. 2 L  Cardiac:  Irregular initially with atrial fibrillation-later converted to SR/ST, No Murmurs, no LE edema noted, no JVD, No carotid bruits, peripheral pulses palpable at 2+  Abdomen:  Positive bowel sounds, Soft, Non tender, Non distended,  No masses appreciated, no obvious hepatosplenomegaly  Skin:  No Cyanosis, Normal Skin Turgor, No Skin Rash or Bruise.  Extremities: Symmetrical without obvious trauma or injury except for left forearm erythema estimated about 6-8 cm in an irregular pattern without fluctuance, tender, small pinhole area at center of erythema but unable to express any exudate,  no joint effusions.  Data Review  CBC  Recent Labs Lab 09/13/14 1110  WBC 21.3*  HGB 13.1  HCT 39.6  PLT 175  MCV 96.4  MCH 31.9  MCHC 33.1  RDW 12.4  LYMPHSABS 1.1  MONOABS 1.8*  EOSABS 0.0  BASOSABS 0.0    Chemistries   Recent Labs Lab 09/13/14 1110  NA 139  K 3.7  CL 108  CO2 23  GLUCOSE 147*  BUN 13  CREATININE 0.67  CALCIUM 9.4  AST 23  ALT 12  ALKPHOS 52  BILITOT 1.3*    estimated creatinine clearance is 52.6 mL/min (by C-G formula based on Cr of 0.67).   Recent Labs  09/13/14 1110  TSH 1.391    Coagulation profile No results for input(s): INR, PROTIME in the last 168 hours.  No results for input(s): DDIMER in the last 72 hours.  Cardiac Enzymes  Recent Labs Lab 09/13/14 1110  TROPONINI 0.04*    Invalid input(s): POCBNP  Urinalysis    Component Value Date/Time   COLORURINE YELLOW 08/13/2014 0035   APPEARANCEUR CLEAR 08/13/2014 0035   LABSPEC 1.018 08/13/2014 0035   PHURINE 6.0 08/13/2014 0035   GLUCOSEU NEGATIVE 08/13/2014 0035   HGBUR MODERATE* 08/13/2014 0035   BILIRUBINUR NEGATIVE 08/13/2014 0035   BILIRUBINUR NEG 04/29/2014 1656   KETONESUR NEGATIVE 08/13/2014 0035   PROTEINUR NEGATIVE 08/13/2014 0035   PROTEINUR NEG 04/29/2014 1656   UROBILINOGEN 0.2 08/13/2014 0035   UROBILINOGEN negative  04/29/2014 1656   NITRITE NEGATIVE 08/13/2014 0035   NITRITE NEG 04/29/2014 1656   LEUKOCYTESUR NEGATIVE 08/13/2014 0035    Imaging results:   Dg Chest Portable 1 View  09/13/2014   CLINICAL DATA:  Left lower lobectomy.  Sarcoid.  EXAM: PORTABLE CHEST - 1 VIEW  COMPARISON:  08/13/2014  FINDINGS: Mild cardiomegaly. Left lower lung atelectasis or infiltrate slightly increased since prior study. Cannot exclude small left effusion. No confluent opacity on the right. No acute bony abnormality. Sclerotic area within the right humeral head is stable.  IMPRESSION: No active disease.   Electronically Signed   By: Rolm Baptise M.D.   On: 09/13/2014 11:12     EKG: Atrial fibrillation with chronic left bundle branch block and ventricular rate of 118   Assessment & Plan  Active Problems:   Cellulitis of right arm -No definitive drainable fluctuance so concerning for MRSA -Consult orthopedic surgeon in the event still may need surgical I&D -Continue vancomycin -Follow-up on blood cultures -Provide appropriate analgesia    Sepsis affecting skin -Currently hemodynamically stable but presented with leukocytosis greater than 14,000, tachycardia, obvious source of infection, and tachypnea -Continue supportive care; ensure adequately hydrated -Patient is chronically immunosuppressed with low-dose prednisone-no indications for stress dose steroids at this time    New onset atrial fibrillation with RVR -Has converted back to NSR/ST -Continue home Lopressor -Given underlying sepsis and recent A. fib with RVR land to admit to stepdown in the event RVR recurs and will require IV Cardizem -CHADVASc is 5 noting patient already on Xarelto prior to admission    Hyperlipidemia -No medications prior to admission    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 -Hemodynamically stable so continue home dose prednisone; no indication for stress dose steroids at this time    DM type 2  -Typically checks CBGs daily at  home -Glucose 147 here -Hold metformin while acutely ill as well as Amaryl -Provide sliding scale coverage for now -Check hemoglobin A1c    Hypertension -Continue beta blocker    OAB  -Back in January had recently  been prescribed Toviaz by her urologist but note this medication none on her home med list so need to clarify    Chronic pain syndrome (LEGS/BACK) -Continue home medications    History of pulmonary embolism -Continue Xarelto    Physical deconditioning -Will need PT/OT evaluation once heart rate stable     DVT Prophylaxis: Xarelto  Family Communication:   No family at bedside  Code Status:  DO NOT RESUSCITATE  Condition:  Stable  Time spent in minutes : 60   Karlis Cregg L. ANP on 09/13/2014 at 1:21 PM  Between 7am to 7pm - Pager - [phone]  After 7pm go to [email or web address] - password [email or web address]  And look for the night coverage person covering me after hours  [facility]

## 2014-09-13 NOTE — Progress Notes (Signed)
PT WITH ABSCESS OVER MID FOREARM UNDERWENT INCISION AND DRAINAGE WILL NEED TO KEEP BANDAGE ON UNTIL F/U APPT WITH ME IN OFFICE ON Friday, CALL TO MAKE APPT WITH STAFF ON Friday IV ABX UNTIL CULTURES COME BACK AND THEN TO ORAL ABX DO NOT REMOVE BANDAGE PLEASE CALL ME DIRECTLY IF ANY QUESTIONS [phone] I WILL BE OUT OF TOWN Tuesday NIGHT THROUGH THE WEEKEND BUT CAN BE REACHED VIA PHONE

## 2014-09-13 NOTE — Anesthesia Preprocedure Evaluation (Addendum)
Anesthesia Evaluation  Patient identified by MRN, date of birth, ID band Patient awake    Reviewed: Allergy & Precautions, NPO status , Patient's Chart, lab work & pertinent test results  Airway Mallampati: II  TM Distance: >3 FB Neck ROM: Full    Dental   Pulmonary former smoker,          Cardiovascular hypertension, + dysrhythmias     Neuro/Psych negative neurological ROS     GI/Hepatic Neg liver ROS, hiatal hernia,   Endo/Other  diabetes, Type 2, Oral Hypoglycemic Agents  Renal/GU negative Renal ROS     Musculoskeletal  (+) Arthritis -,   Abdominal   Peds  Hematology negative hematology ROS (+)   Anesthesia Other Findings   Reproductive/Obstetrics                           Anesthesia Physical Anesthesia Plan  ASA: III  Anesthesia Plan: General   Post-op Pain Management:    Induction: Intravenous and Rapid sequence  Airway Management Planned: Oral ETT  Additional Equipment:   Intra-op Plan:   Post-operative Plan: Extubation in OR  Informed Consent: I have reviewed the patients History and Physical, chart, labs and discussed the procedure including the risks, benefits and alternatives for the proposed anesthesia with the patient or authorized representative who has indicated his/her understanding and acceptance.     Plan Discussed with: CRNA  Anesthesia Plan Comments:         Anesthesia Quick Evaluation

## 2014-09-13 NOTE — ED Notes (Signed)
Pt here for weakness x 2 days, possible DVT in left arm. IV RFA with 300 cc of fluid. BP 92/42. afib on the monitor. CBG 171.

## 2014-09-13 NOTE — Transfer of Care (Signed)
Immediate Anesthesia Transfer of Care Note  Patient: Elizabeth Drake  Procedure(s) Performed: Procedure(s): IRRIGATION AND DEBRIDEMENT EXTREMITY (Left)  Patient Location: PACU  Anesthesia Type:General  Level of Consciousness: awake, alert  and oriented  Airway & Oxygen Therapy: Patient Spontanous Breathing and Patient connected to nasal cannula oxygen  Post-op Assessment: Report given to RN and Post -op Vital signs reviewed and stable  Post vital signs: Reviewed and stable  Last Vitals:  Filed Vitals:   09/13/14 2156  BP:   Pulse:   Temp: 37.1 C  Resp:     Complications: No apparent anesthesia complications

## 2014-09-14 ENCOUNTER — Encounter (HOSPITAL_COMMUNITY): Payer: Self-pay | Admitting: Orthopedic Surgery

## 2014-09-14 DIAGNOSIS — L0291 Cutaneous abscess, unspecified: Secondary | ICD-10-CM

## 2014-09-14 DIAGNOSIS — L03113 Cellulitis of right upper limb: Secondary | ICD-10-CM

## 2014-09-14 DIAGNOSIS — R5381 Other malaise: Secondary | ICD-10-CM

## 2014-09-14 DIAGNOSIS — I4891 Unspecified atrial fibrillation: Secondary | ICD-10-CM

## 2014-09-14 LAB — BASIC METABOLIC PANEL
ANION GAP: 7 (ref 5–15)
BUN: 9 mg/dL (ref 6–23)
CO2: 19 mmol/L (ref 19–32)
Calcium: 7.4 mg/dL — ABNORMAL LOW (ref 8.4–10.5)
Chloride: 114 mmol/L — ABNORMAL HIGH (ref 96–112)
Creatinine, Ser: 0.61 mg/dL (ref 0.50–1.10)
GFR, EST NON AFRICAN AMERICAN: 87 mL/min — AB (ref >90)
Glucose, Bld: 115 mg/dL — ABNORMAL HIGH (ref 70–99)
POTASSIUM: 2.9 mmol/L — AB (ref 3.5–5.1)
Sodium: 140 mmol/L (ref 135–145)

## 2014-09-14 LAB — HEMOGLOBIN A1C
HEMOGLOBIN A1C: 6.9 % — AB (ref 4.8–5.6)
Mean Plasma Glucose: 151 mg/dL

## 2014-09-14 LAB — GLUCOSE, CAPILLARY
GLUCOSE-CAPILLARY: 244 mg/dL — AB (ref 70–99)
GLUCOSE-CAPILLARY: 75 mg/dL (ref 70–99)
Glucose-Capillary: 122 mg/dL — ABNORMAL HIGH (ref 70–99)
Glucose-Capillary: 101 mg/dL — ABNORMAL HIGH (ref 70–99)
Glucose-Capillary: 244 mg/dL — ABNORMAL HIGH (ref 70–99)
Glucose-Capillary: 153 mg/dL — ABNORMAL HIGH (ref 70–99)

## 2014-09-14 LAB — CBC
HCT: 32.5 % — ABNORMAL LOW (ref 36.0–46.0)
HEMOGLOBIN: 10.5 g/dL — AB (ref 12.0–15.0)
MCH: 32 pg (ref 26.0–34.0)
MCHC: 32.3 g/dL (ref 30.0–36.0)
MCV: 99.1 fL (ref 78.0–100.0)
Platelets: 146 10*3/uL — ABNORMAL LOW (ref 150–400)
RBC: 3.28 MIL/uL — ABNORMAL LOW (ref 3.87–5.11)
RDW: 12.2 % (ref 11.5–15.5)
WBC: 16.5 10*3/uL — ABNORMAL HIGH (ref 4.0–10.5)

## 2014-09-14 LAB — POTASSIUM: Potassium: 3.7 mmol/L (ref 3.5–5.1)

## 2014-09-14 LAB — MAGNESIUM
Magnesium: 1.3 mg/dL — ABNORMAL LOW (ref 1.5–2.5)
Magnesium: 2.1 mg/dL (ref 1.5–2.5)

## 2014-09-14 MED ORDER — OFF THE BEAT BOOK
Freq: Once | Status: AC
  Administered 2014-09-14: 15:00:00
  Filled 2014-09-14: qty 1

## 2014-09-14 MED ORDER — POTASSIUM CHLORIDE 10 MEQ/100ML IV SOLN
10.0000 meq | INTRAVENOUS | Status: AC
  Administered 2014-09-14 (×6): 10 meq via INTRAVENOUS
  Filled 2014-09-14 (×7): qty 100

## 2014-09-14 MED ORDER — ACETAMINOPHEN 650 MG RE SUPP
975.0000 mg | Freq: Once | RECTAL | Status: AC
  Administered 2014-09-14: 325 mg via RECTAL
  Filled 2014-09-14: qty 1

## 2014-09-14 MED ORDER — POTASSIUM CHLORIDE CRYS ER 20 MEQ PO TBCR
40.0000 meq | EXTENDED_RELEASE_TABLET | ORAL | Status: DC
  Filled 2014-09-14: qty 2

## 2014-09-14 MED ORDER — ENSURE PUDDING PO PUDG
1.0000 | Freq: Two times a day (BID) | ORAL | Status: DC
  Administered 2014-09-16: 1 via ORAL

## 2014-09-14 MED ORDER — PIPERACILLIN-TAZOBACTAM 3.375 G IVPB
3.3750 g | Freq: Three times a day (TID) | INTRAVENOUS | Status: DC
  Administered 2014-09-14 – 2014-09-16 (×7): 3.375 g via INTRAVENOUS
  Filled 2014-09-14 (×9): qty 50

## 2014-09-14 MED ORDER — MAGNESIUM SULFATE 2 GM/50ML IV SOLN
2.0000 g | Freq: Once | INTRAVENOUS | Status: AC
  Administered 2014-09-14: 2 g via INTRAVENOUS
  Filled 2014-09-14: qty 50

## 2014-09-14 NOTE — Anesthesia Postprocedure Evaluation (Signed)
  Anesthesia Post-op Note  Patient: KELLYANNE ELLWANGER  Procedure(s) Performed: Procedure(s): IRRIGATION AND DEBRIDEMENT EXTREMITY (Left)  Patient Location: PACU  Anesthesia Type:General  Level of Consciousness: awake and alert   Airway and Oxygen Therapy: Patient Spontanous Breathing  Post-op Pain: none  Post-op Assessment: Post-op Vital signs reviewed  Post-op Vital Signs: Reviewed  Last Vitals:  Filed Vitals:   09/14/14 0700  BP:   Pulse:   Temp: 39.4 C  Resp:     Complications: No apparent anesthesia complications

## 2014-09-14 NOTE — Progress Notes (Signed)
INITIAL NUTRITION ASSESSMENT  Pt meets criteria for NON-SEVERE (MODERATE) MALNUTRITION in the context of chronic illness as evidenced by severe fat mass depletion and moderate muscle mass depletion.  DOCUMENTATION CODES Per approved criteria  -Non-severe (moderate) malnutrition in the context of chronic illness   INTERVENTION: Discontinue Ensure complete.  Provide Ensure Pudding po BID, each supplement provides 170 kcal and 4 grams of protein.  Encourage adequate PO intake.  NUTRITION DIAGNOSIS: Increased nutrient needs related to pressure ulcer as evidenced by estimated nutrition needs.   Goal: Pt to meet >/= 90% of their estimated nutrition needs   Monitor:  PO intake, weight trends, labs, I/O's  Reason for Assessment: MST  74 y.o. female  Admitting Dx: <principal problem not specified>  ASSESSMENT: Pt with history of diabetes, hypothyroidism, hypertension, sarcoidosis on chronic prednisone and overactive bladder. She also has significant physical deconditioning and complains repeatedly of a normalized as well as recurrent lower extremity weakness. Presents with generalized weakness.  Pt reports having a good appetite currently and PTA at home eating at least 2-3 meals a day with no other difficulties. Meal completion is 90%. Pt reports usual body weight of 112 lbs. Weight has been stable. Pt currently has Ensure complete ordered and reports not liking it. Pt is agreeable on Ensure pudding. RD to modify orders. Pt was encouraged to eat her food at meals.  Nutrition Focused Physical Exam:  Subcutaneous Fat:  Orbital Region: N/A Upper Arm Region: Severe depletion Thoracic and Lumbar Region: WNL  Muscle:  Temple Region: N/A Clavicle Bone Region: WNL Clavicle and Acromion Bone Region: WNL Scapular Bone Region: N/A Dorsal Hand: N/A Patellar Region: Moderate depletion Anterior Thigh Region: Moderate depletion Posterior Calf Region: Moderate depletion  Edema:  none  Labs: Low potassium, calcium, and GFR. High chloride.  Height: Ht Readings from Last 1 Encounters:  09/14/14 5\' 6"  (1.676 m)    Weight: Wt Readings from Last 1 Encounters:  09/14/14 112 lb 6.4 oz (50.984 kg)    Ideal Body Weight: 130 lbs  % Ideal Body Weight: 86%  Wt Readings from Last 10 Encounters:  09/14/14 112 lb 6.4 oz (50.984 kg)  07/18/13 111 lb 15.9 oz (50.8 kg)  06/30/13 119 lb 4.3 oz (54.1 kg)  04/25/13 115 lb (52.164 kg)  04/25/13 116 lb (52.617 kg)  04/24/13 116 lb (52.617 kg)  12/31/12 116 lb (52.617 kg)    Usual Body Weight: 112 lbs  % Usual Body Weight: 100%  BMI:  Body mass index is 18.15 kg/(m^2). Underweight  Estimated Nutritional Needs: Kcal: 1550-1800 Protein: 65-75 grams Fluid: >/= 1.5 L/day  Skin: Stage I pressure ulcer on sacrum, incision on left arm  Diet Order: Diet heart healthy/carb modified  EDUCATION NEEDS: -No education needs identified at this time   Intake/Output Summary (Last 24 hours) at 09/14/14 1223 Last data filed at 09/14/14 0428  Gross per 24 hour  Intake    700 ml  Output    707 ml  Net     -7 ml    Last BM: 2/8  Labs:   Recent Labs Lab 09/13/14 1110 09/14/14 0542  NA 139 140  K 3.7 2.9*  CL 108 114*  CO2 23 19  BUN 13 9  CREATININE 0.67 0.61  CALCIUM 9.4 7.4*  MG  --  1.3*  GLUCOSE 147* 115*    CBG (last 3)   Recent Labs  09/14/14 1129 09/14/14 1137  GLUCAP 244* 244*    Scheduled Meds: . docusate sodium  100 mg Oral BID  . feeding supplement (ENSURE COMPLETE)  237 mL Oral BID BM  . insulin aspart  0-15 Units Subcutaneous TID WC  . insulin aspart  0-5 Units Subcutaneous QHS  . metoprolol tartrate  25 mg Oral BID  . piperacillin-tazobactam (ZOSYN)  IV  3.375 g Intravenous Q8H  . potassium chloride  10 mEq Intravenous Q1 Hr x 6  . predniSONE  5 mg Oral Q breakfast  . rivaroxaban  20 mg Oral Q supper  . sodium chloride  3 mL Intravenous Q12H  . vancomycin  1,000 mg Intravenous  Q24H    Continuous Infusions: . sodium chloride 75 mL/hr at 09/14/14 0927  . diltiazem (CARDIZEM) infusion Stopped (09/13/14 1309)    Past Medical History  Diagnosis Date  . Sarcoidosis   . Hiatal hernia   . Diabetes mellitus without complication   . Osteoporosis   . DVT (deep venous thrombosis)   . Pulmonary emboli     Past Surgical History  Procedure Laterality Date  . Abdominal hysterectomy    . Joint replacement      toe  . Lung lobectomy Left     Lower    Kallie Locks, [place], [place], LDN Pager # [phone] After hours/ weekend pager # [phone]

## 2014-09-14 NOTE — Progress Notes (Signed)
Dear Doctor:  This patient has been identified as a candidate for PICC for the following reason (s): IV therapy over 48 hours and drug extravasation potential with tissue necrosis (KCL, Dilantin, Dopamine, CaCl, MgSO4, chemo vesicant) If you agree, please write an order for the indicated device. For any questions contact the Vascular Access Team at [phone] if no answer, please leave a message.  Thank you for supporting the early vascular access assessment program.

## 2014-09-14 NOTE — Progress Notes (Addendum)
Patient Demographics  Elizabeth Drake, is a 74 y.o. female, DOB - 1940-10-31, DPO:[id number]  Admit date - 09/13/2014   Admitting Physician Evalee Mutton Kristeen Mans, MD  Outpatient Primary MD for the patient is Chevis Pretty, [place]  LOS - 1   Chief Complaint  Patient presents with  . DVT  . Weakness  . Atrial Fibrillation      Admission history of present illness/brief narrative: Elizabeth Drake is a 74 y.o. female, with history of diabetes, hypothyroidism, hypertension, sarcoidosis on chronic prednisone and overactive bladder. Patient presents with generalized weakness ,Upon evaluation she was found to be a new atrial fibrillation with RVR.  She also was complaining of pain in the left arm. She was found to have significant erythema and likely cellulitis of the left forearm. Her white count was 21,300. Blood cultures were obtained in the ER and she was started on vancomycin. When we initially examined her she was still atrial fibrillation with rates peaking up into the 120s so IV Cardizem infusion was ordered and cardiology was consulted. Patient converted back to normal sinus rhythm , where she has been off Cardizem drip, patient was seen by orthopedic Dr Apolonio Schneiders, where she had incision and drainage, for abscess over the mid forearm, cultures were sent, patient continues to spike fever, this a.m. 104.2, she is back into A. fib with RVR, and Cardizem drip was resumed, IV Zosyn was added for coverage pending culture results.  Subjective:   [name] today has, No headache, No chest pain, No abdominal pain - patient is febrile, reports generalized weakness. 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 Caralyn Guile on 2/9 - Appears toxic, so will add Zosyn to vancomycin coverage, fever of 104.2 this a.m. - Leukocytosis continues to improve - Follow on wound cultures and blood cultures -UTI is positive, but contaminated, will follow with urine cultures, but patient is covered with broad-spectrum IV antibiotics regardless.    New onset atrial fibrillation with RVR -On and off A. fib rhythm, back on Cardizem drip -Continue home Lopressor -Most likely related to sepsis -CHADVASc is 5 noting patient already on Xarelto prior to admission - Monitor potassium and magnesium and replete. -Follow on Echo  Hypokalemia - Replaced, will recheck in a.m.  Hypomagnesemia - Replace, recheck in a.m.    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 -Hemodynamically stable so continue home dose prednisone; no indication for stress dose steroids at this time   DM type 2  -Glucose 147 here -Hold metformin while acutely ill as well as Amaryl -Provide sliding scale coverage for now -hemoglobin A1c 6.9   Hypertension -Continue beta blocker   OAB  -Back in January had recently been prescribed Toviaz by her urologist but note this  medication none on her home med list so need to clarify   Chronic pain syndrome (LEGS/BACK) -Continue home medications   History of pulmonary embolism -Continue Xarelto   Physical deconditioning -Will need PT/OT evaluation once heart rate stable  Code Status: DO NOT RESUSCITATE  Family Communication: None at bedside, tried to reach husband and sister over the phone with no answer.  Disposition Plan: Remains on telemetry   Procedures  Incision and  revision of left arm abscess on 2/8 Echo pending   Consults   Orthopedic Cardiology   Medications  Scheduled Meds: . docusate sodium  100 mg Oral BID  . feeding supplement (ENSURE COMPLETE)  237 mL Oral BID BM  . insulin aspart  0-15 Units Subcutaneous TID WC  . insulin aspart  0-5 Units Subcutaneous QHS  . metoprolol tartrate  25 mg Oral BID  . piperacillin-tazobactam (ZOSYN)  IV  3.375 g Intravenous Q8H  . potassium chloride  10 mEq Intravenous Q1 Hr x 6  . predniSONE  5 mg Oral Q breakfast  . rivaroxaban  20 mg Oral Q supper  . sodium chloride  3 mL Intravenous Q12H  . vancomycin  1,000 mg Intravenous Q24H   Continuous Infusions: . sodium chloride 75 mL/hr at 09/14/14 0927  . diltiazem (CARDIZEM) infusion Stopped (09/13/14 1309)   PRN Meds:.acetaminophen **OR** acetaminophen, alum & mag hydroxide-simeth, fluticasone, HYDROcodone-acetaminophen, morphine injection, ondansetron **OR** ondansetron (ZOFRAN) IV, polyethylene glycol  DVT Prophylaxis  on Xarelto  Lab Results  Component Value Date   PLT 146* 09/14/2014    Antibiotics    Anti-infectives    Start     Dose/Rate Route Frequency Ordered Stop   09/14/14 1100  vancomycin (VANCOCIN) IVPB 1000 mg/200 mL premix     1,000 mg200 mL/hr over 60 Minutes Intravenous Every 24 hours 09/13/14 1227     09/14/14 0900  piperacillin-tazobactam (ZOSYN) IVPB 3.375 g     3.375 g12.5 mL/hr over 240 Minutes Intravenous Every 8 hours 09/14/14 0832     09/13/14 1100  vancomycin (VANCOCIN) IVPB 1000 mg/200 mL premix     1,000 mg200 mL/hr over 60 Minutes Intravenous  Once 09/13/14 1054 09/13/14 1218   09/13/14 1045  vancomycin (VANCOCIN) IVPB 1000 mg/200 mL premix  Status:  Discontinued     1,000 mg200 mL/hr over 60 Minutes Intravenous  Once 09/13/14 1039 09/13/14 1054          Objective:   Filed Vitals:   09/14/14 1006 09/14/14 1027 09/14/14 1040 09/14/14 1104  BP: 119/67 127/57 124/57 122/51  Pulse: 133 119 123 81  Temp:       TempSrc:      Resp:  20  20  Height:      Weight:      SpO2:  100%  100%    Wt Readings from Last 3 Encounters:  09/14/14 50.984 kg (112 lb 6.4 oz)  07/18/13 50.8 kg (111 lb 15.9 oz)  06/30/13 54.1 kg (119 lb 4.3 oz)     Intake/Output Summary (Last 24 hours) at 09/14/14 1127 Last data filed at 09/14/14 0428  Gross per 24 hour  Intake    700 ml  Output    707 ml  Net     -7 ml     Physical Exam  Awake Alert, ill-appearing, No new F.N deficits, Normal affect [place].AT,PERRAL Supple Neck,No JVD, No cervical lymphadenopathy appriciated.  Symmetrical Chest wall movement, Good air movement bilaterally, CTAB RRR,No Gallops,Rubs or new Murmurs, No Parasternal Heave +ve B.Sounds,  Abd Soft, No tenderness, No organomegaly appriciated, No rebound - guarding or rigidity. No Cyanosis, Clubbing or edema, No new Rash or bruise  , left arm bandaged with Ace wrap at surgical site.   Data Review   Micro Results Recent Results (from the past 240 hour(s))  Culture, routine-abscess     Status: None (Preliminary result)   Collection Time: 09/13/14  9:30 PM  Result Value Ref Range Status   Specimen Description ABSCESS FOREARM LEFT  Final   Special Requests PATIENT ON FOLLOWING VANCOMYCIN  Final   Gram Stain   Final    ABUNDANT WBC PRESENT, PREDOMINANTLY PMN NO SQUAMOUS EPITHELIAL CELLS SEEN FEW GRAM POSITIVE COCCI IN CLUSTERS Performed at [facility]    Culture PENDING  Incomplete   Report Status PENDING  Incomplete  Anaerobic culture     Status: None (Preliminary result)   Collection Time: 09/13/14  9:30 PM  Result Value Ref Range Status   Specimen Description ABSCESS FOREARM LEFT  Final   Special Requests PATIENT ON FOLLOWING VANCOMYCIN  Final   Gram Stain PENDING  Incomplete   Culture   Final    NO ANAEROBES ISOLATED; CULTURE IN PROGRESS FOR 5 DAYS Performed at [facility]    Report Status PENDING  Incomplete    Radiology Reports Dg Chest Portable 1  View  09/13/2014   CLINICAL DATA:  Left lower lobectomy.  Sarcoid.  EXAM: PORTABLE CHEST - 1 VIEW  COMPARISON:  08/13/2014  FINDINGS: Mild cardiomegaly. Left lower lung atelectasis or infiltrate slightly increased since prior study. Cannot exclude small left effusion. No confluent opacity on the right. No acute bony abnormality. Sclerotic area within the right humeral head is stable.  IMPRESSION: No active disease.   Electronically Signed   By: Rolm Baptise M.D.   On: 09/13/2014 11:12    CBC  Recent Labs Lab 09/13/14 1110 09/14/14 0542  WBC 21.3* 16.5*  HGB 13.1 10.5*  HCT 39.6 32.5*  PLT 175 146*  MCV 96.4 99.1  MCH 31.9 32.0  MCHC 33.1 32.3  RDW 12.4 12.2  LYMPHSABS 1.1  --   MONOABS 1.8*  --   EOSABS 0.0  --   BASOSABS 0.0  --     Chemistries   Recent Labs Lab 09/13/14 1110 09/14/14 0542  NA 139 140  K 3.7 2.9*  CL 108 114*  CO2 23 19  GLUCOSE 147* 115*  BUN 13 9  CREATININE 0.67 0.61  CALCIUM 9.4 7.4*  MG  --  1.3*  AST 23  --   ALT 12  --   ALKPHOS 52  --   BILITOT 1.3*  --    ------------------------------------------------------------------------------------------------------------------ estimated creatinine clearance is 49.7 mL/min (by C-G formula based on Cr of 0.61). ------------------------------------------------------------------------------------------------------------------  Recent Labs  09/13/14 1334  HGBA1C 6.9*   ------------------------------------------------------------------------------------------------------------------ No results for input(s): CHOL, HDL, LDLCALC, TRIG, CHOLHDL, LDLDIRECT in the last 72 hours. ------------------------------------------------------------------------------------------------------------------  Recent Labs  09/13/14 1110  TSH 1.391   ------------------------------------------------------------------------------------------------------------------ No results for input(s): VITAMINB12, FOLATE, FERRITIN,  TIBC, IRON, RETICCTPCT in the last 72 hours.  Coagulation profile No results for input(s): INR, PROTIME in the last 168 hours.  No results for input(s): DDIMER in the last 72 hours.  Cardiac Enzymes  Recent Labs Lab 09/13/14 1110  TROPONINI 0.04*   ------------------------------------------------------------------------------------------------------------------ Invalid input(s): POCBNP     Time Spent in minutes   35 minutes   Liane Tribbey M.D on 09/14/2014 at 11:27 AM  Between 7am to  7pm - Pager - [phone]  After 7pm go to [email or web address] - password [email or web address]  And look for the night coverage person covering for me after hours  [facility] Office  [phone]   **Disclaimer: This note may have been dictated with voice recognition software. Similar sounding words can inadvertently be transcribed and this note may contain transcription errors which may not have been corrected upon publication of note.**

## 2014-09-14 NOTE — Op Note (Signed)
Elizabeth Drake, Elizabeth Drake NO.:  [id number]  MEDICAL RECORD NO.:  [id number]  LOCATION:  [id number]                        FACILITY:  [facility]  PHYSICIAN:  Melrose Nakayama, MD  DATE OF BIRTH:  October 17, 1940  DATE OF PROCEDURE:  09/13/2014 DATE OF DISCHARGE:                              OPERATIVE REPORT   PREOPERATIVE DIAGNOSIS:  Left forearm deep space abscess.  POSTOPERATIVE DIAGNOSIS:  Left forearm deep space abscess.  ATTENDING PHYSICIAN:  Melrose Nakayama, MD who scrubbed and present for the entire procedure.  ASSISTANT SURGEON:  None.  ANESTHESIA:  General via LMA.  SURGICAL PROCEDURE:  Left forearm incision and drainage of deep abscess.  SURGICAL INDICATIONS:  Elizabeth Drake is a right-hand-dominant female who was admitted to the Internal Medicine Service with elevated white count and worsening infection in the left forearm.  The patient was seen and evaluated and recommended to undergo the above procedure.  Risks, benefits, and alternatives were discussed in detail with the patient and signed informed consent was obtained.  Risks include, but not limited to bleeding; infection; damage to nearby nerves, arteries, or tendons; loss of motion of the wrist and digits; incomplete relief of symptoms; and need for further surgical intervention.  DESCRIPTION OF PROCEDURE:  The patient was properly identified in the preop holding and marked with a permanent marker made on the left wrist to indicate the correct operative site.  The patient was then brought back to the operating room, placed supine on the anesthesia room table. General anesthesia was administered.  The patient tolerated this well. The patient had been on preoperative antibiotics prior to skin incision. A well-padded tourniquet was placed on left brachium, sealed with 1000 drape.  Left upper extremity was then prepped and draped in normal sterile fashion.  Time-out was called.  The correct side was  identified and the procedure was then begun.  Attention was then turned to the left forearm and the midshaft-to-mid region of the ulna once the incision made directly over the skin, subcutaneous tissue.  Deep dissection was carried down to the fascia layer where the abscess cavity was then evacuated.  The patient did have gross purulence in mid region of the forearm and dorsal compartments.  The wound was then thoroughly evacuated.  Thorough wound irrigation and excisional debridement was then carried out of the necrotic tissue.  After opening of the abscess area and drainage, irrigation was run copiously through the wound.  The wound was then loosely reapproximated with three Prolene sutures and the wound was loosely closed.  Adaptic dressing and sterile compressive bandage were then applied.  The patient tolerated the procedure well and returned to recovery room in good condition.  POSTPROCEDURE PLAN:  The patient was admitted back to the Internal Medicine Service.  Continue on IV antibiotics until her cultures come back.  See her back in the office on Friday for wound check, and then continue on the oral antibiotics as the cultures dictate.     Melrose Nakayama, MD     FWO/MEDQ  D:  09/13/2014  T:  09/14/2014  Job:  [id number]

## 2014-09-14 NOTE — Progress Notes (Signed)
While waiting for IV team , Pt converted back to NSR 70's. MD paged and aware. Gtt not started. Will continue to monitor.

## 2014-09-14 NOTE — Consult Note (Signed)
Cardiologist:  New Reason for Consult: Afib Referring Physician: Elgergawy  Elizabeth Drake is an 74 y.o. female.  HPI:   The patient is a 74 yo female with a history of DVT, PE, sarcoidosis(on chronic prednisone), DM.  She presented with cellulitis in the left forearm.  She underwent I&D yesterday with Dr. Caralyn Guile.  We are asked to see for Afib RVR.  She was placed on IV cardizem and continued on home lopressor.  She already takes Xarelto for PE/DVT.    She reports never needing a cardiologist before.  Her mom had CHF and dad died from pancreatic cancer.  She is feeling a little better today.  She has chronic problems with nausea.  Does not eat much.  She denies palpitations, elevated HR, vomiting, abd pain, dizziness, CP, LEE, cough, congestion. She has been febrile(104.2 earlier).   Past Medical History  Diagnosis Date  . Sarcoidosis   . Hiatal hernia   . Diabetes mellitus without complication   . Osteoporosis   . DVT (deep venous thrombosis)   . Pulmonary emboli     Past Surgical History  Procedure Laterality Date  . Abdominal hysterectomy    . Joint replacement      toe  . Lung lobectomy Left     Lower  . I&d extremity Left 09/13/2014    Procedure: IRRIGATION AND DEBRIDEMENT EXTREMITY;  Surgeon: Linna Hoff, MD;  Location: [facility];  Service: Orthopedics;  Laterality: Left;    Family History  Problem Relation Age of Onset  . Heart disease Mother   . Hypertension Mother   . Cancer Father     pancratic  . Cancer - Other Sister     Social History:  reports that she quit smoking about 13 years ago. Her smoking use included Cigarettes. She does not have any smokeless tobacco history on file. She reports that she does not drink alcohol or use illicit drugs.  Allergies:  Allergies  Allergen Reactions  . Advil [Ibuprofen] Shortness Of Breath and Swelling  . Azo [Phenazopyridine] Other (See Comments)    Unknown reaction  . Indocin [Indomethacin] Other (See Comments)   Unknown reaction  . Keflex [Cephalexin]   . Loratadine Other (See Comments)    unknown  . Percocet [Oxycodone-Acetaminophen] Itching    Medications:  Scheduled Meds: . docusate sodium  100 mg Oral BID  . feeding supplement (ENSURE)  1 Container Oral BID BM  . insulin aspart  0-15 Units Subcutaneous TID WC  . insulin aspart  0-5 Units Subcutaneous QHS  . metoprolol tartrate  25 mg Oral BID  . off the beat book   Does not apply Once  . piperacillin-tazobactam (ZOSYN)  IV  3.375 g Intravenous Q8H  . potassium chloride  10 mEq Intravenous Q1 Hr x 6  . predniSONE  5 mg Oral Q breakfast  . rivaroxaban  20 mg Oral Q supper  . sodium chloride  3 mL Intravenous Q12H  . vancomycin  1,000 mg Intravenous Q24H   Continuous Infusions: . sodium chloride 75 mL/hr at 09/14/14 0927  . diltiazem (CARDIZEM) infusion Stopped (09/13/14 1309)   PRN Meds:.acetaminophen **OR** acetaminophen, alum & mag hydroxide-simeth, fluticasone, HYDROcodone-acetaminophen, morphine injection, ondansetron **OR** ondansetron (ZOFRAN) IV, polyethylene glycol   Results for orders placed or performed during the hospital encounter of 09/13/14 (from the past 48 hour(s))  Blood culture (routine x 2)     Status: None (Preliminary result)   Collection Time: 09/13/14 11:00 AM  Result Value  Ref Range   Specimen Description BLOOD ARM RIGHT    Special Requests BOTTLES DRAWN AEROBIC AND ANAEROBIC 5CC    Culture             BLOOD CULTURE RECEIVED NO GROWTH TO DATE CULTURE WILL BE HELD FOR 5 DAYS BEFORE ISSUING A FINAL NEGATIVE REPORT Performed at [facility]    Report Status PENDING   Comprehensive metabolic panel     Status: Abnormal   Collection Time: 09/13/14 11:10 AM  Result Value Ref Range   Sodium 139 135 - 145 mmol/L   Potassium 3.7 3.5 - 5.1 mmol/L   Chloride 108 96 - 112 mmol/L   CO2 23 19 - 32 mmol/L   Glucose, Bld 147 (H) 70 - 99 mg/dL   BUN 13 6 - 23 mg/dL   Creatinine, Ser 0.67 0.50 - 1.10 mg/dL    Calcium 9.4 8.4 - 10.5 mg/dL   Total Protein 6.9 6.0 - 8.3 g/dL   Albumin 3.0 (L) 3.5 - 5.2 g/dL   AST 23 0 - 37 U/L   ALT 12 0 - 35 U/L   Alkaline Phosphatase 52 39 - 117 U/L   Total Bilirubin 1.3 (H) 0.3 - 1.2 mg/dL   GFR calc non Af Amer 84 (L) >90 mL/min   GFR calc Af Amer >90 >90 mL/min    Comment: (NOTE) The eGFR has been calculated using the CKD EPI equation. This calculation has not been validated in all clinical situations. eGFR's persistently <90 mL/min signify possible Chronic Kidney Disease.    Anion gap 8 5 - 15  TSH     Status: None   Collection Time: 09/13/14 11:10 AM  Result Value Ref Range   TSH 1.391 0.350 - 4.500 uIU/mL  Blood culture (routine x 2)     Status: None (Preliminary result)   Collection Time: 09/13/14 11:10 AM  Result Value Ref Range   Specimen Description BLOOD HAND LEFT    Special Requests BOTTLES DRAWN AEROBIC AND ANAEROBIC 5CC    Culture             BLOOD CULTURE RECEIVED NO GROWTH TO DATE CULTURE WILL BE HELD FOR 5 DAYS BEFORE ISSUING A FINAL NEGATIVE REPORT Performed at [facility]    Report Status PENDING   CBC with Differential     Status: Abnormal   Collection Time: 09/13/14 11:10 AM  Result Value Ref Range   WBC 21.3 (H) 4.0 - 10.5 K/uL   RBC 4.11 3.87 - 5.11 MIL/uL   Hemoglobin 13.1 12.0 - 15.0 g/dL   HCT 39.6 36.0 - 46.0 %   MCV 96.4 78.0 - 100.0 fL   MCH 31.9 26.0 - 34.0 pg   MCHC 33.1 30.0 - 36.0 g/dL   RDW 12.4 11.5 - 15.5 %   Platelets 175 150 - 400 K/uL   Neutrophils Relative % 87 (H) 43 - 77 %   Neutro Abs 18.4 (H) 1.7 - 7.7 K/uL   Lymphocytes Relative 5 (L) 12 - 46 %   Lymphs Abs 1.1 0.7 - 4.0 K/uL   Monocytes Relative 8 3 - 12 %   Monocytes Absolute 1.8 (H) 0.1 - 1.0 K/uL   Eosinophils Relative 0 0 - 5 %   Eosinophils Absolute 0.0 0.0 - 0.7 K/uL   Basophils Relative 0 0 - 1 %   Basophils Absolute 0.0 0.0 - 0.1 K/uL  Troponin I     Status: Abnormal   Collection Time: 09/13/14 11:10 AM  Result Value Ref  Range   Troponin I 0.04 (H) <0.031 ng/mL    Comment:        PERSISTENTLY INCREASED TROPONIN VALUES IN THE RANGE OF 0.04-0.49 ng/mL CAN BE SEEN IN:       -UNSTABLE ANGINA       -CONGESTIVE HEART FAILURE       -MYOCARDITIS       -CHEST TRAUMA       -ARRYHTHMIAS       -LATE PRESENTING MYOCARDIAL INFARCTION       -COPD   CLINICAL FOLLOW-UP RECOMMENDED.   I-Stat CG4 Lactic Acid, ED     Status: None   Collection Time: 09/13/14 11:27 AM  Result Value Ref Range   Lactic Acid, Venous 1.71 0.5 - 2.0 mmol/L  Hemoglobin A1c     Status: Abnormal   Collection Time: 09/13/14  1:34 PM  Result Value Ref Range   Hgb A1c MFr Bld 6.9 (H) 4.8 - 5.6 %    Comment: (NOTE)         Pre-diabetes: 5.7 - 6.4         Diabetes: >6.4         Glycemic control for adults with diabetes: <7.0    Mean Plasma Glucose 151 mg/dL    Comment: (NOTE) Performed At: [facility] [place], [place] [id number] Lindon Romp MD [id number]   Urinalysis, Routine w reflex microscopic     Status: Abnormal   Collection Time: 09/13/14  6:45 PM  Result Value Ref Range   Color, Urine YELLOW YELLOW   APPearance CLOUDY (A) CLEAR   Specific Gravity, Urine 1.020 1.005 - 1.030   pH 6.5 5.0 - 8.0   Glucose, UA 100 (A) NEGATIVE mg/dL   Hgb urine dipstick LARGE (A) NEGATIVE   Bilirubin Urine NEGATIVE NEGATIVE   Ketones, ur 15 (A) NEGATIVE mg/dL   Protein, ur 30 (A) NEGATIVE mg/dL   Urobilinogen, UA 1.0 0.0 - 1.0 mg/dL   Nitrite POSITIVE (A) NEGATIVE   Leukocytes, UA MODERATE (A) NEGATIVE  Urine microscopic-add on     Status: Abnormal   Collection Time: 09/13/14  6:45 PM  Result Value Ref Range   Squamous Epithelial / LPF FEW (A) RARE   WBC, UA 11-20 <3 WBC/hpf   RBC / HPF 7-10 <3 RBC/hpf   Bacteria, UA MANY (A) RARE   Casts HYALINE CASTS (A) NEGATIVE   Crystals CA OXALATE CRYSTALS (A) NEGATIVE   Urine-Other MUCOUS PRESENT   Culture, routine-abscess     Status: None (Preliminary result)    Collection Time: 09/13/14  9:30 PM  Result Value Ref Range   Specimen Description ABSCESS FOREARM LEFT    Special Requests PATIENT ON FOLLOWING VANCOMYCIN    Gram Stain      ABUNDANT WBC PRESENT, PREDOMINANTLY PMN NO SQUAMOUS EPITHELIAL CELLS SEEN FEW GRAM POSITIVE COCCI IN CLUSTERS Performed at [facility]    Culture PENDING    Report Status PENDING   Anaerobic culture     Status: None (Preliminary result)   Collection Time: 09/13/14  9:30 PM  Result Value Ref Range   Specimen Description ABSCESS FOREARM LEFT    Special Requests PATIENT ON FOLLOWING VANCOMYCIN    Gram Stain PENDING    Culture      NO ANAEROBES ISOLATED; CULTURE IN PROGRESS FOR 5 DAYS Performed at [facility]    Report Status PENDING   CBC     Status: Abnormal   Collection Time:  09/14/14  5:42 AM  Result Value Ref Range   WBC 16.5 (H) 4.0 - 10.5 K/uL   RBC 3.28 (L) 3.87 - 5.11 MIL/uL   Hemoglobin 10.5 (L) 12.0 - 15.0 g/dL    Comment: SPECIMEN CHECKED FOR CLOTS REPEATED TO VERIFY    HCT 32.5 (L) 36.0 - 46.0 %   MCV 99.1 78.0 - 100.0 fL   MCH 32.0 26.0 - 34.0 pg   MCHC 32.3 30.0 - 36.0 g/dL   RDW 12.2 11.5 - 15.5 %   Platelets 146 (L) 150 - 400 K/uL  Basic metabolic panel     Status: Abnormal   Collection Time: 09/14/14  5:42 AM  Result Value Ref Range   Sodium 140 135 - 145 mmol/L   Potassium 2.9 (L) 3.5 - 5.1 mmol/L   Chloride 114 (H) 96 - 112 mmol/L   CO2 19 19 - 32 mmol/L   Glucose, Bld 115 (H) 70 - 99 mg/dL   BUN 9 6 - 23 mg/dL   Creatinine, Ser 0.61 0.50 - 1.10 mg/dL   Calcium 7.4 (L) 8.4 - 10.5 mg/dL   GFR calc non Af Amer 87 (L) >90 mL/min   GFR calc Af Amer >90 >90 mL/min    Comment: (NOTE) The eGFR has been calculated using the CKD EPI equation. This calculation has not been validated in all clinical situations. eGFR's persistently <90 mL/min signify possible Chronic Kidney Disease.    Anion gap 7 5 - 15  Magnesium     Status: Abnormal   Collection Time: 09/14/14   5:42 AM  Result Value Ref Range   Magnesium 1.3 (L) 1.5 - 2.5 mg/dL  Glucose, capillary     Status: Abnormal   Collection Time: 09/14/14 11:29 AM  Result Value Ref Range   Glucose-Capillary 244 (H) 70 - 99 mg/dL  Glucose, capillary     Status: Abnormal   Collection Time: 09/14/14 11:37 AM  Result Value Ref Range   Glucose-Capillary 244 (H) 70 - 99 mg/dL    Dg Chest Portable 1 View  09/13/2014   CLINICAL DATA:  Left lower lobectomy.  Sarcoid.  EXAM: PORTABLE CHEST - 1 VIEW  COMPARISON:  08/13/2014  FINDINGS: Mild cardiomegaly. Left lower lung atelectasis or infiltrate slightly increased since prior study. Cannot exclude small left effusion. No confluent opacity on the right. No acute bony abnormality. Sclerotic area within the right humeral head is stable.  IMPRESSION: No active disease.   Electronically Signed   By: Rolm Baptise M.D.   On: 09/13/2014 11:12    Review of Systems  Constitutional: Positive for fever.  HENT: Negative for sore throat.   Respiratory: Negative for cough, shortness of breath and wheezing.   Cardiovascular: Negative for chest pain, palpitations, orthopnea, leg swelling and PND.  Gastrointestinal: Positive for nausea (Chronic). Negative for vomiting and abdominal pain.  Genitourinary: Positive for hematuria (UA positive).  Musculoskeletal: Negative for myalgias (Arm does not hurt).  Neurological: Positive for weakness. Negative for dizziness.  All other systems reviewed and are negative.  Blood pressure 101/49, pulse 84, temperature 101.8 F (38.8 C), temperature source Rectal, resp. rate 18, height $RemoveBe'5\' 6"'AGseCAvMn$  (1.676 m), weight 112 lb 6.4 oz (50.984 kg), SpO2 100 %. Physical Exam  Nursing note and vitals reviewed. Constitutional: She is oriented to person, place, and time. She appears well-developed. No distress.  HENT:  Head: Normocephalic and atraumatic.  Mouth/Throat: No oropharyngeal exudate.  Eyes: EOM are normal. Pupils are equal, round, and reactive to  light. No scleral icterus.  Neck: Normal range of motion. Neck supple.  Cardiovascular: Normal rate, regular rhythm, S1 normal and S2 normal.   No murmur heard. Pulses:      Radial pulses are 2+ on the right side, and 2+ on the left side.       Dorsalis pedis pulses are 2+ on the right side, and 2+ on the left side.  Respiratory: Effort normal and breath sounds normal. She has no wheezes. She has no rales.  GI: Soft. Bowel sounds are normal. She exhibits no distension. There is no tenderness.  Musculoskeletal: She exhibits no edema.  Lymphadenopathy:    She has no cervical adenopathy.  Neurological: She is alert and oriented to person, place, and time. She exhibits normal muscle tone.  Skin: Skin is warm and dry.  Psychiatric: She has a normal mood and affect.    Assessment/Plan: Active Problems:   Hyperlipidemia   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   DM type 2 (diabetes mellitus, type 2)   OAB (overactive bladder)   Chronic pain syndrome (LEGS/BACK)   History of pulmonary embolism   Physical deconditioning   Hypertension   Cellulitis of right arm   Sepsis affecting skin   UTI   Hypomagnesemia   Hypokalemia    Atrial fibrillation with RVR Likely triggered by sepsis, low mag(2G given) and K(60 IV meq given).  Converted back to NSR.  Echo: EF 60% mild LVH, normal wall motion. LA mildly dilated.  Mild MR.  CHADSVASC: 4.  On Xarelto.  Recheck mag and K tonight.  Continue lopressor. Cardizem was stopped.  BP is soft.  Troponin elevation(0.04) related to Afib and metabolic derrangement.  I do not think an ischemic eval is needed.     Tarri Fuller, PA-C 09/14/2014, 3:36 PM     Personally seen and examined. Agree with above. 74 year old thin, cachectic, malnourished female with infection, fever with associated atrial fibrillation with rapid ventricular response, paroxysmal currently sinus rhythm. Exam reveals thin extremities, no murmurs, normal rhythm. Left arm is wrapped, infection  underlying. Lab work reviewed.  Paroxysmal atrial fibrillation-agree with plan as above, a lecture light repletion. It would not be unusual for [place] to see this once again given her underlying infection but hopefully as she continues to heal, we will see less and less of atrial fibrillation. No further invasive evaluation or ischemic evaluation is necessary. Minimal troponin elevation likely secondary to demand ischemia in the setting of A. fib with RVR versus metabolic derangement. She currently is on anticoagulation for prior DVT.  Please call us if any other issues arise. We will sign off.  Candee Furbish, MD

## 2014-09-14 NOTE — Progress Notes (Signed)
  Echocardiogram 2D Echocardiogram has been performed.  Johny Chess 09/14/2014, 12:14 PM

## 2014-09-14 NOTE — Progress Notes (Signed)
UR Completed.  [phone]  

## 2014-09-15 DIAGNOSIS — D869 Sarcoidosis, unspecified: Secondary | ICD-10-CM

## 2014-09-15 DIAGNOSIS — N3281 Overactive bladder: Secondary | ICD-10-CM

## 2014-09-15 DIAGNOSIS — E785 Hyperlipidemia, unspecified: Secondary | ICD-10-CM

## 2014-09-15 DIAGNOSIS — G894 Chronic pain syndrome: Secondary | ICD-10-CM

## 2014-09-15 DIAGNOSIS — I10 Essential (primary) hypertension: Secondary | ICD-10-CM

## 2014-09-15 DIAGNOSIS — E440 Moderate protein-calorie malnutrition: Secondary | ICD-10-CM

## 2014-09-15 LAB — GLUCOSE, CAPILLARY
GLUCOSE-CAPILLARY: 105 mg/dL — AB (ref 70–99)
GLUCOSE-CAPILLARY: 87 mg/dL (ref 70–99)
GLUCOSE-CAPILLARY: 144 mg/dL — AB (ref 70–99)
Glucose-Capillary: 111 mg/dL — ABNORMAL HIGH (ref 70–99)
Glucose-Capillary: 120 mg/dL — ABNORMAL HIGH (ref 70–99)
Glucose-Capillary: 117 mg/dL — ABNORMAL HIGH (ref 70–99)

## 2014-09-15 LAB — CBC
HEMATOCRIT: 32.1 % — AB (ref 36.0–46.0)
Hemoglobin: 10.4 g/dL — ABNORMAL LOW (ref 12.0–15.0)
MCH: 31.4 pg (ref 26.0–34.0)
MCHC: 32.4 g/dL (ref 30.0–36.0)
MCV: 97 fL (ref 78.0–100.0)
PLATELETS: 156 10*3/uL (ref 150–400)
RBC: 3.31 MIL/uL — AB (ref 3.87–5.11)
RDW: 12.3 % (ref 11.5–15.5)
WBC: 13.6 10*3/uL — AB (ref 4.0–10.5)

## 2014-09-15 LAB — BASIC METABOLIC PANEL
Anion gap: 5 (ref 5–15)
BUN: 6 mg/dL (ref 6–23)
CO2: 23 mmol/L (ref 19–32)
Calcium: 8.5 mg/dL (ref 8.4–10.5)
Chloride: 111 mmol/L (ref 96–112)
Creatinine, Ser: 0.64 mg/dL (ref 0.50–1.10)
GFR calc Af Amer: >90 mL/min (ref >90)
GFR calc non Af Amer: 86 mL/min — ABNORMAL LOW (ref >90)
Glucose, Bld: 93 mg/dL (ref 70–99)
POTASSIUM: 3.7 mmol/L (ref 3.5–5.1)
Sodium: 139 mmol/L (ref 135–145)

## 2014-09-15 LAB — MRSA PCR SCREENING: MRSA by PCR: POSITIVE — AB

## 2014-09-15 MED ORDER — MUPIROCIN 2 % EX OINT
TOPICAL_OINTMENT | Freq: Two times a day (BID) | CUTANEOUS | Status: DC
  Administered 2014-09-15 – 2014-09-16 (×3): via NASAL
  Administered 2014-09-16: 1 via NASAL
  Administered 2014-09-17: 11:00:00 via NASAL
  Filled 2014-09-15: qty 22

## 2014-09-15 NOTE — Progress Notes (Signed)
ANTIBIOTIC CONSULT NOTE   Pharmacy Consult:  Vancomycin / Zosyn Indication:  Arm abscess / UTI  Allergies  Allergen Reactions  . Advil [Ibuprofen] Shortness Of Breath and Swelling  . Azo [Phenazopyridine] Other (See Comments)    Unknown reaction  . Indocin [Indomethacin] Other (See Comments)    Unknown reaction  . Keflex [Cephalexin]   . Loratadine Other (See Comments)    unknown  . Percocet [Oxycodone-Acetaminophen] Itching    Labs:  Recent Labs  09/13/14 1110 09/14/14 0542 09/15/14 0539  WBC 21.3* 16.5* 13.6*  HGB 13.1 10.5* 10.4*  PLT 175 146* 156  CREATININE 0.67 0.61 0.64    Assessment: 9 YOF with arm abscess s/p I + D with abscess cultures positive for staph aureus (senstivities pending).  Urine culture also positive for E coli.  Scr stable, Tmax = 100.6, WBC trending down Vanc 2/8 >> Zosyn 2/9>  Goal of Therapy:  Vancomycin trough level 15-20 mcg/ml  Plan:  Continue Vancomycin 1 gram iv Q 24 hours -- pending sensitivities Continue Zosyn 3.375 grams iv Q 8 hours - 4 hr infusion Follow up cultures  Thank you. Anette Guarneri, PharmD [phone] 09/15/2014, 10:29 AM

## 2014-09-15 NOTE — Clinical Documentation Improvement (Signed)
Possible Clinical Conditions?  Severe Malnutrition   Protein Calorie Malnutrition Severe Protein Calorie Malnutrition Other Condition Cannot clinically determine  Supporting Information: INITIAL NUTRITION ASSESSMENT by Kallie Locks, RD at 09/14/2014  Pt meets criteria for NON-SEVERE (MODERATE) MALNUTRITION in the context of chronic illness as evidenced by severe fat mass depletion and moderate muscle mass depletion.  DOCUMENTATION CODES  Per approved criteria   -Non-severe (moderate) malnutrition in the context of chronic illness   INTERVENTION:  Discontinue Ensure complete.  Provide Ensure Pudding po BID, each supplement provides 170 kcal and 4 grams of protein.  Encourage adequate PO intake.  NUTRITION DIAGNOSIS:  Increased nutrient needs related to pressure ulcer as evidenced by estimated nutrition needs.    Thank You, Alessandra Grout, RN, BSN, CCDS,Clinical Documentation Specialist:  [phone]  [phone]=[facility] [place]- Health Information Management

## 2014-09-15 NOTE — Progress Notes (Signed)
[facility]                                                                              Patient Demographics  Elizabeth Drake, is a 74 y.o. female, DOB - 1940-10-07, GUY:[id number]  Admit date - 09/13/2014   Admitting Physician Elizabeth Mutton Kristeen Mans, MD  Outpatient Primary MD for the patient is Elizabeth Drake, [place]  LOS - 2   Chief Complaint  Patient presents with  . DVT  . Weakness  . Atrial Fibrillation      HPI on 09/13/2014 By Erin Hearing, NP with Dr. Oren Binet Elizabeth Drake is a 73 y.o. female, with history of diabetes, hypothyroidism, hypertension, sarcoidosis on chronic prednisone and overactive bladder. She also has significant physical deconditioning and complains repeatedly of a normalized as well as recurrent lower extremity weakness. She was recently seen in ER at the beginning of January for complaints of generalized weakness but because of significant findings she was discharged back to home. She returns again with generalized weakness. Upon evaluation she was found to be a new atrial fibrillation with RVR. She reports that she has been weak in her legs for at least 3 days. She also was complaining of pain in the left arm. She was found to have significant erythema and likely cellulitis of the left forearm. Her white count was 21,300. Blood cultures were obtained in the ER and she was started on vancomycin. When we initially examined her she was still atrial fibrillation with rates peaking up into the 120s so IV Cardizem infusion was ordered and cardiology was consulted. Later she converted back to sinus rhythm/sinus tachycardia.  Assessment & Plan   Sepsis secondary to Left arm Cellulitis/abscess  -Patient continues to febrile with leukocytosis -S/p I&D by Dr. Caralyn Guile on 09/14/2014 -Continue vancomycin and Zosyn -Cultures show no growth to date -Left forearm abscess shows abundant WBCs present, Staphylococcus aureus; no anaerobes isolated  Paroxysmal atrial  fibrillation with RVR -Likely secondary to sepsis -Patient initially placed on Cardizem drip however this was discontinued -Cardiology consultation appreciated -CHADSVASC 5  -Continue Lopressor and Xarelto -Echocardiogram shows EF 60%, wall motion was normal, no social cardiac emboli  Hypokalemia -Replace, continue to monitor and replace as needed  Hypomagnesemia -Replaced, continue to monitor and replace as needed  Hyperlipidemia -No medications prior to admission -Lipid panel pending  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 -Hemodynamically stable so continue home dose prednisone; no indication for stress dose steroids at this time  Diabetes mellitus, type II -Hemoglobin A1c 6.9 -Metformin and Amaryl held -Continue insulin sliding scale with CBG monitoring  Hypertension -Continue beta blocker  OAB  -Supposedly urologist prescribed Toviaz in January 2016 however not on home medication list  Chronic pain syndrome (LEGS/BACK) -Continue home medications  History of pulmonary embolism -Continue Xarelto  Physical deconditioning -Will need PT/OT evaluation once heart rate stable  Moderate malnutrition  -Nutrition consultation appreciated, continue nutritional supplements  Code Status: DNR  Family Communication: None at bedside  Disposition Plan: Admitted.  patient continues to be septic.  Pending PT/OT consults.  Expect discharge wtihin 24-48hours  Time Spent in minutes   30 minutes  Procedures  Echocardiogram I&D  Consults   Cardiology Orthopedics  DVT Prophylaxis  Xarelto  Lab Results  Component Value Date   PLT 156 09/15/2014    Medications  Scheduled Meds: . docusate sodium  100 mg Oral BID  . feeding supplement (ENSURE)  1 Container Oral BID BM  . insulin aspart  0-15 Units Subcutaneous TID WC  . insulin aspart  0-5 Units Subcutaneous QHS  . metoprolol tartrate  25 mg Oral BID  . piperacillin-tazobactam (ZOSYN)  IV  3.375 g Intravenous Q8H  .  predniSONE  5 mg Oral Q breakfast  . rivaroxaban  20 mg Oral Q supper  . sodium chloride  3 mL Intravenous Q12H  . vancomycin  1,000 mg Intravenous Q24H   Continuous Infusions: . sodium chloride 75 mL/hr at 09/15/14 0634  . diltiazem (CARDIZEM) infusion Stopped (09/13/14 1309)   PRN Meds:.acetaminophen **OR** acetaminophen, alum & mag hydroxide-simeth, fluticasone, HYDROcodone-acetaminophen, morphine injection, ondansetron **OR** ondansetron (ZOFRAN) IV, polyethylene glycol  Antibiotics    Anti-infectives    Start     Dose/Rate Route Frequency Ordered Stop   09/14/14 1100  vancomycin (VANCOCIN) IVPB 1000 mg/200 mL premix     1,000 mg 200 mL/hr over 60 Minutes Intravenous Every 24 hours 09/13/14 1227     09/14/14 0900  piperacillin-tazobactam (ZOSYN) IVPB 3.375 g     3.375 g 12.5 mL/hr over 240 Minutes Intravenous Every 8 hours 09/14/14 0832     09/13/14 1100  vancomycin (VANCOCIN) IVPB 1000 mg/200 mL premix     1,000 mg 200 mL/hr over 60 Minutes Intravenous  Once 09/13/14 1054 09/13/14 1218   09/13/14 1045  vancomycin (VANCOCIN) IVPB 1000 mg/200 mL premix  Status:  Discontinued     1,000 mg 200 mL/hr over 60 Minutes Intravenous  Once 09/13/14 1039 09/13/14 1054        Subjective:   Elizabeth Drake seen and examined today.  Patient states she does not feel well. Denies any chest pain or shortness of breath or abdominal pain at this time she does complain of feeling weakness  Objective:   Filed Vitals:   09/14/14 1534 09/14/14 1720 09/14/14 1929 09/15/14 0415  BP:  120/50 118/63 108/63  Pulse:   83 83  Temp: 101.8 F (38.8 C) 100.9 F (38.3 C) 99.6 F (37.6 C) 100.6 F (38.1 C)  TempSrc: Rectal Rectal Rectal Rectal  Resp:  20 16 16   Height:      Weight:    52 kg (114 lb 10.2 oz)  SpO2:  100% 100% 100%    Wt Readings from Last 3 Encounters:  09/15/14 52 kg (114 lb 10.2 oz)  07/18/13 50.8 kg (111 lb 15.9 oz)  06/30/13 54.1 kg (119 lb 4.3 oz)     Intake/Output  Summary (Last 24 hours) at 09/15/14 1322 Last data filed at 09/15/14 0730  Gross per 24 hour  Intake    508 ml  Output    850 ml  Net   -342 ml    Exam  General: Well developed, well nourished, NAD, appears stated age  HEENT: NCAT, mucous membranes moist.   Cardiovascular: S1 S2 auscultated, Regular rate and rhythm.  Respiratory: Clear to auscultation bilaterally with equal chest rise  Abdomen: Soft, nontender, nondistended, + bowel sounds  Extremities: warm dry without cyanosis clubbing or edema. LUE-forearm wrapped  Neuro: AAOx3, nonfocal  Psych: Normal affect and demeanor with intact judgement and insight  Data Review   Micro Results Recent Results (from the past 240 hour(s))  Blood culture (routine x 2)  Status: None (Preliminary result)   Collection Time: 09/13/14 11:00 AM  Result Value Ref Range Status   Specimen Description BLOOD ARM RIGHT  Final   Special Requests BOTTLES DRAWN AEROBIC AND ANAEROBIC 5CC  Final   Culture   Final           BLOOD CULTURE RECEIVED NO GROWTH TO DATE CULTURE WILL BE HELD FOR 5 DAYS BEFORE ISSUING A FINAL NEGATIVE REPORT Performed at [facility]    Report Status PENDING  Incomplete  Blood culture (routine x 2)     Status: None (Preliminary result)   Collection Time: 09/13/14 11:10 AM  Result Value Ref Range Status   Specimen Description BLOOD HAND LEFT  Final   Special Requests BOTTLES DRAWN AEROBIC AND ANAEROBIC 5CC  Final   Culture   Final           BLOOD CULTURE RECEIVED NO GROWTH TO DATE CULTURE WILL BE HELD FOR 5 DAYS BEFORE ISSUING A FINAL NEGATIVE REPORT Performed at [facility]    Report Status PENDING  Incomplete  Urine culture     Status: None (Preliminary result)   Collection Time: 09/13/14  6:45 PM  Result Value Ref Range Status   Specimen Description URINE, CATHETERIZED  Final   Special Requests NONE  Final   Colony Count   Final    >=100,000 COLONIES/ML Performed at [facility]     Culture   Final    ESCHERICHIA COLI Performed at [facility]    Report Status PENDING  Incomplete  Culture, routine-abscess     Status: None (Preliminary result)   Collection Time: 09/13/14  9:30 PM  Result Value Ref Range Status   Specimen Description ABSCESS FOREARM LEFT  Final   Special Requests PATIENT ON FOLLOWING VANCOMYCIN  Final   Gram Stain   Final    ABUNDANT WBC PRESENT, PREDOMINANTLY PMN NO SQUAMOUS EPITHELIAL CELLS SEEN FEW GRAM POSITIVE COCCI IN CLUSTERS Performed at [facility]    Culture   Final    ABUNDANT STAPHYLOCOCCUS AUREUS Note: RIFAMPIN AND GENTAMICIN SHOULD NOT BE USED AS SINGLE DRUGS FOR TREATMENT OF STAPH INFECTIONS. Performed at [facility]    Report Status PENDING  Incomplete  Anaerobic culture     Status: None (Preliminary result)   Collection Time: 09/13/14  9:30 PM  Result Value Ref Range Status   Specimen Description ABSCESS FOREARM LEFT  Final   Special Requests PATIENT ON FOLLOWING VANCOMYCIN  Final   Gram Stain PENDING  Incomplete   Culture   Final    NO ANAEROBES ISOLATED; CULTURE IN PROGRESS FOR 5 DAYS Performed at [facility]    Report Status PENDING  Incomplete  MRSA PCR Screening     Status: Abnormal   Collection Time: 09/15/14  9:48 AM  Result Value Ref Range Status   MRSA by PCR POSITIVE (A) NEGATIVE Final    Comment:        The GeneXpert MRSA Assay (FDA approved for NASAL specimens only), is one component of a comprehensive MRSA colonization surveillance program. It is not intended to diagnose MRSA infection nor to guide or monitor treatment for MRSA infections. RESULT CALLED TO, READ BACK BY AND VERIFIED WITH: T. JACOBS RN 12:45 09/15/14 ([email or web address])     Radiology Reports Dg Chest Portable 1 View  09/13/2014   CLINICAL DATA:  Left lower lobectomy.  Sarcoid.  EXAM: PORTABLE CHEST - 1 VIEW  COMPARISON:  08/13/2014  FINDINGS: Mild cardiomegaly.  Left lower lung atelectasis or infiltrate  slightly increased since prior study. Cannot exclude small left effusion. No confluent opacity on the right. No acute bony abnormality. Sclerotic area within the right humeral head is stable.  IMPRESSION: No active disease.   Electronically Signed   By: Rolm Baptise M.D.   On: 09/13/2014 11:12    CBC  Recent Labs Lab 09/13/14 1110 09/14/14 0542 09/15/14 0539  WBC 21.3* 16.5* 13.6*  HGB 13.1 10.5* 10.4*  HCT 39.6 32.5* 32.1*  PLT 175 146* 156  MCV 96.4 99.1 97.0  MCH 31.9 32.0 31.4  MCHC 33.1 32.3 32.4  RDW 12.4 12.2 12.3  LYMPHSABS 1.1  --   --   MONOABS 1.8*  --   --   EOSABS 0.0  --   --   BASOSABS 0.0  --   --     Chemistries   Recent Labs Lab 09/13/14 1110 09/14/14 0542 09/14/14 1955 09/15/14 0539  NA 139 140  --  139  K 3.7 2.9* 3.7 3.7  CL 108 114*  --  111  CO2 23 19  --  23  GLUCOSE 147* 115*  --  93  BUN 13 9  --  6  CREATININE 0.67 0.61  --  0.64  CALCIUM 9.4 7.4*  --  8.5  MG  --  1.3* 2.1  --   AST 23  --   --   --   ALT 12  --   --   --   ALKPHOS 52  --   --   --   BILITOT 1.3*  --   --   --    ------------------------------------------------------------------------------------------------------------------ estimated creatinine clearance is 50.6 mL/min (by C-G formula based on Cr of 0.64). ------------------------------------------------------------------------------------------------------------------  Recent Labs  09/13/14 1334  HGBA1C 6.9*   ------------------------------------------------------------------------------------------------------------------ No results for input(s): CHOL, HDL, LDLCALC, TRIG, CHOLHDL, LDLDIRECT in the last 72 hours. ------------------------------------------------------------------------------------------------------------------  Recent Labs  09/13/14 1110  TSH 1.391   ------------------------------------------------------------------------------------------------------------------ No results for input(s):  VITAMINB12, FOLATE, FERRITIN, TIBC, IRON, RETICCTPCT in the last 72 hours.  Coagulation profile No results for input(s): INR, PROTIME in the last 168 hours.  No results for input(s): DDIMER in the last 72 hours.  Cardiac Enzymes  Recent Labs Lab 09/13/14 1110  TROPONINI 0.04*   ------------------------------------------------------------------------------------------------------------------ Invalid input(s): POCBNP    Aviva Wolfer D.O. on 09/15/2014 at 1:22 PM  Between 7am to 7pm - Pager - [phone]  After 7pm go to [email or web address] - password [email or web address]  And look for the night coverage person covering for me after hours  [facility] Office  [phone]

## 2014-09-15 NOTE — Clinical Social Work Psychosocial (Signed)
Clinical Social Work Department BRIEF PSYCHOSOCIAL ASSESSMENT 09/15/2014  Patient:  Elizabeth Drake, Elizabeth Drake     Account Number:  [id number]     Admit date:  09/13/2014  Clinical Social Worker:  Domenica Reamer, CLINICAL SOCIAL WORKER  Date/Time:  09/15/2014 04:24 PM  Referred by:  Physician  Date Referred:  09/15/2014 Referred for  SNF Placement   Other Referral:   Interview type:  Patient Other interview type:    PSYCHOSOCIAL DATA Living Status:  HUSBAND Admitted from facility:   Level of care:   Primary support name:  Sharone Picchi Primary support relationship to patient:  SPOUSE Degree of support available:   high level of support from husband but pt states he had a recent fall and broke his wrist- patient also has son who lives with her most of the time and works night shift so he would be available during the day    CURRENT CONCERNS Current Concerns  Post-Acute Placement   Other Concerns:    [place] / PLAN CSW spoke with patinet concerning SNF placement.    Patient is not agreeable to SNF placement at this time but states that if she does not improve she might be willing to change her mind.  Patient states that she was basically independent at home even though she was wheelchair bound.    Patient wants to work with PT again and assess her ability to transfer to a wheelchair.    Patient states that she went to rehab at [facility] a while back and that she does not want to go through that again.   Assessment/plan status:  Psychosocial Support/Ongoing Assessment of Needs Other assessment/ plan:   none at this time   Information/referral to community resources:   CSW contacted PT to assess patient with use of wheelchair    PATIENT'S/FAMILY'S RESPONSE TO PLAN OF CARE: Patient is not agreeable to plan for SNF at this time and would rather return home with her family- patient values her personal time and does not like the idea of going to rehab or having PT come into  the home and disturb her schedule.       Domenica Reamer, [place] Social Worker [phone]

## 2014-09-15 NOTE — Evaluation (Signed)
Physical Therapy Evaluation Patient Details Name: Elizabeth Drake MRN: [id number] DOB: Mar 09, 1941 Today's Date: 09/15/2014   History of Present Illness  Elizabeth Drake is a 74 y.o. female, with history of diabetes, hypothyroidism, hypertension, sarcoidosis on chronic prednisone and overactive bladder. She also has significant physical deconditioning. She was found to be a new atrial fibrillation with RVR. She reports that she has been weak in her legs for at least 3 days. She also was complaining of pain in the left arm. She was found to have significant erythema and likely cellulitis of the left forearm.Underwent I & D left forearm on 09/14/14.  Clinical Impression  Pt admitted with above diagnosis. Pt currently with functional limitations due to the deficits listed below (see PT Problem List). Pt currently requiring min A for safe transfers, which she does not always have at home. So at his point recommend SNF, though if pt stays in hospital a few more days with acute PT, may be able to achieve home with HHPT/ OT, this would be pt's preference. This is wa Pt will benefit from skilled PT to increase their independence and safety with mobility to allow discharge to the venue listed below.       Follow Up Recommendations SNF    Equipment Recommendations  None recommended by PT    Recommendations for Other Services OT consult     Precautions / Restrictions Precautions Precautions: Fall Restrictions Weight Bearing Restrictions: No      Mobility  Bed Mobility Overal bed mobility: Needs Assistance Bed Mobility: Supine to Sit     Supine to sit: Min assist     General bed mobility comments: min A to get hips all the way to edge of bed. Pt had diffciulty due to softness of mattress  Transfers Overall transfer level: Needs assistance Equipment used: 1 person hand held assist Transfers: Squat Pivot Transfers     Squat pivot transfers: Min assist     General transfer comment: recliner  wider than pt's w/c at home so difficulty reaching across for far arm. Therapist stood directly in front of pt with support around torso, min A given for stability and power.   Ambulation/Gait             General Gait Details: unable  Stairs            Wheelchair Mobility    Modified Rankin (Stroke Patients Only)       Balance Overall balance assessment: Needs assistance Sitting-balance support: No upper extremity supported Sitting balance-Leahy Scale: Fair Sitting balance - Comments: pt's balance affected by scoliosis, difficulty maintaining balance with dynamic activity   Standing balance support: Bilateral upper extremity supported Standing balance-Leahy Scale: Poor Standing balance comment: pt unable to achieve full standing but requires support during partial stand for transfers                             Pertinent Vitals/Pain Pain Assessment: No/denies pain  O2 sats 98% on RA, O2 left off and RN notified    Home Living Family/patient expects to be discharged to:: Private residence Living Arrangements: Spouse/significant other;Children Available Help at Discharge: Family;Available PRN/intermittently Type of Home: House Home Access: Ramped entrance     Home Layout: Two level;Able to live on main level with bedroom/bathroom;Laundry or work area in [place]: Wheelchair - Education officer, community - power Additional Comments: pt's son lives with she and her husband but works full time. Her husband  is 6 yo and currently has a broken arm. They have a chair lift to the basement.    Prior Function Level of Independence: Needs assistance   Gait / Transfers Assistance Needed: does not ambulate, was independent with transfers  ADL's / Homemaking Assistance Needed: husband and son help with meals, she has a housekeeper for cleaning, unsure about bathing        Hand Dominance        Extremity/Trunk Assessment   Upper Extremity  Assessment: LUE deficits/detail;Generalized weakness       LUE Deficits / Details: bandaged from I&D   Lower Extremity Assessment: RLE deficits/detail;LLE deficits/detail;Generalized weakness RLE Deficits / Details: weaker than left, uses hands to lift leg to place it off bed or reposition when in sitting, strength grossly 2/5 LLE Deficits / Details: strength grossly 3/5  Cervical / Trunk Assessment: Other exceptions  Communication   Communication: No difficulties  Cognition Arousal/Alertness: Awake/alert Behavior During Therapy: WFL for tasks assessed/performed Overall Cognitive Status: Within Functional Limits for tasks assessed                      General Comments      Exercises Other Exercises Other Exercises: discussed LE ther ex to perform in bed and chair      Assessment/Plan    PT Assessment Patient needs continued PT services  PT Diagnosis Generalized weakness   PT Problem List Decreased strength;Decreased activity tolerance;Decreased range of motion;Decreased balance;Decreased mobility  PT Treatment Interventions DME instruction;Functional mobility training;Therapeutic activities;Therapeutic exercise;Balance training;Neuromuscular re-education;Patient/family education   PT Goals (Current goals can be found in the Care Plan section) Acute Rehab PT Goals Patient Stated Goal: return home PT Goal Formulation: With patient Time For Goal Achievement: 09/29/14 Potential to Achieve Goals: Good    Frequency Min 3X/week   Barriers to discharge Decreased caregiver support      Co-evaluation               End of Session   Activity Tolerance: Patient tolerated treatment well Patient left: in chair;with call bell/phone within reach Nurse Communication: Mobility status         Time: 1210-1233 PT Time Calculation (min) (ACUTE ONLY): 23 min   Charges:   PT Evaluation $Initial PT Evaluation Tier I: 1 Procedure PT Treatments $Therapeutic  Activity: 23-37 mins   PT G Codes:      Leighton Roach, PT  Acute Rehab Services  [place], [place] 09/15/2014, 1:54 PM

## 2014-09-16 LAB — CBC
HCT: 30.8 % — ABNORMAL LOW (ref 36.0–46.0)
HEMOGLOBIN: 9.9 g/dL — AB (ref 12.0–15.0)
MCH: 31.3 pg (ref 26.0–34.0)
MCHC: 32.1 g/dL (ref 30.0–36.0)
MCV: 97.5 fL (ref 78.0–100.0)
PLATELETS: 175 10*3/uL (ref 150–400)
RBC: 3.16 MIL/uL — ABNORMAL LOW (ref 3.87–5.11)
RDW: 12 % (ref 11.5–15.5)
WBC: 7.3 10*3/uL (ref 4.0–10.5)

## 2014-09-16 LAB — GLUCOSE, CAPILLARY
GLUCOSE-CAPILLARY: 101 mg/dL — AB (ref 70–99)
GLUCOSE-CAPILLARY: 78 mg/dL (ref 70–99)
Glucose-Capillary: 97 mg/dL (ref 70–99)
Glucose-Capillary: 112 mg/dL — ABNORMAL HIGH (ref 70–99)
Glucose-Capillary: 217 mg/dL — ABNORMAL HIGH (ref 70–99)
Glucose-Capillary: 185 mg/dL — ABNORMAL HIGH (ref 70–99)

## 2014-09-16 LAB — BASIC METABOLIC PANEL
Anion gap: 7 (ref 5–15)
BUN: 11 mg/dL (ref 6–23)
CO2: 21 mmol/L (ref 19–32)
CREATININE: 0.6 mg/dL (ref 0.50–1.10)
Calcium: 8.2 mg/dL — ABNORMAL LOW (ref 8.4–10.5)
Chloride: 112 mmol/L (ref 96–112)
GFR calc Af Amer: >90 mL/min (ref >90)
GFR calc non Af Amer: 87 mL/min — ABNORMAL LOW (ref >90)
GLUCOSE: 100 mg/dL — AB (ref 70–99)
Potassium: 3.7 mmol/L (ref 3.5–5.1)
Sodium: 140 mmol/L (ref 135–145)

## 2014-09-16 LAB — CULTURE, ROUTINE-ABSCESS

## 2014-09-16 MED ORDER — SULFAMETHOXAZOLE-TRIMETHOPRIM 800-160 MG PO TABS
1.0000 | ORAL_TABLET | Freq: Two times a day (BID) | ORAL | Status: DC
  Administered 2014-09-16 – 2014-09-17 (×3): 1 via ORAL
  Filled 2014-09-16 (×4): qty 1

## 2014-09-16 MED ORDER — ENSURE PUDDING PO PUDG
1.0000 | Freq: Two times a day (BID) | ORAL | Status: DC
Start: 2014-09-16 — End: 2014-12-17

## 2014-09-16 MED ORDER — MUPIROCIN 2 % EX OINT: TOPICAL_OINTMENT | Freq: Two times a day (BID) | CUTANEOUS | Status: AC

## 2014-09-16 MED ORDER — DOCUSATE SODIUM 100 MG PO CAPS: 100.0000 mg | ORAL_CAPSULE | Freq: Two times a day (BID) | ORAL | Status: DC

## 2014-09-16 MED ORDER — SULFAMETHOXAZOLE-TRIMETHOPRIM 800-160 MG PO TABS: 1.0000 | ORAL_TABLET | Freq: Two times a day (BID) | ORAL | Status: DC

## 2014-09-16 NOTE — Discharge Summary (Signed)
Physician Discharge Summary  Elizabeth Drake ATF:[id number] DOB: May 10, 1941 DOA: 09/13/2014  PCP: Chevis Pretty, FNP  Admit date: 09/13/2014 Discharge date: 09/17/2014  Time spent: 45 minutes  Recommendations for Outpatient Follow-up:  Patient will be discharged to [facility] facility.  Patient to continue physical as well as occupational therapy is recommended by the facility. Patient will need to continue her medications as prescribed. She will need to follow-up with her primary care physician within one week of discharge as well as Dr. Caralyn Guile, orthopedist.  Patient should follow a heart healthy/carb modified diet.   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 Oren Binet [name] is a 74 y.o. female, with history of diabetes, hypothyroidism, hypertension, sarcoidosis on chronic prednisone and overactive bladder. She also has significant physical deconditioning and complains repeatedly of a normalized as well as recurrent lower extremity weakness. She was recently seen in ER at the beginning of January for complaints of generalized weakness but because of significant findings she was discharged back to home. She returns again with generalized weakness. Upon evaluation she was found to be a new atrial fibrillation with RVR. She reports that she has been weak in her legs for at  least 3 days. She also was complaining of pain in the left arm. She was found to have significant erythema and likely cellulitis of the left forearm. Her white count was 21,300. Blood cultures were obtained in the ER and she was started on vancomycin. When we initially examined her she was still atrial fibrillation with rates peaking up into the 120s so IV Cardizem infusion was ordered and cardiology was consulted. Later she converted back to sinus rhythm/sinus tachycardia.  Hospital Course:  Sepsis secondary to Left arm Cellulitis/abscess  -Patient was febrile with leukocytosis and tachycardia upon admission however this has all resolved. -S/p I&D by Dr. Caralyn Guile on 09/14/2014 -Initially placed on vancomycin and zosyn, but will discharge patient with Bactrim -Blood Cultures show no growth to date -Left forearm abscess shows abundant WBCs present, Staphylococcus aureus; no anaerobes isolated -Patient will need to follow-up with orthopedics, Dr. Caralyn Guile for follow-up.  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 -Hemodynamically stable so continue home dose prednisone; no indication for stress dose steroids at this time  Diabetes mellitus, type II -Hemoglobin A1c 6.9 -Metformin and Amaryl held, but may be continued on discharge -Placed on insulin sliding scale during hospitalization  Hypertension -Continue beta blocker  OAB  -Supposedly urologist prescribed Toviaz in January 2016 however not on home medication  list  Chronic pain syndrome (LEGS/BACK) -Continue home medications  History of  pulmonary embolism -Continue Xarelto  Physical deconditioning -PT and OT recommended nursing home placement -Patient was reluctant regarding going to skilled nursing however will be discharged [facility]  Moderate malnutrition  -Nutrition consultation appreciated, continue nutritional supplements  Code Status: DNR  Procedures: Echocardiogram I&D  Consultations: Cardiology Orthopedics  Discharge Exam: Filed Vitals:   09/17/14 0500  BP: 121/65  Pulse: 75  Temp: 98.3 F (36.8 C)  Resp: 17   Exam  General: Well developed, NAD, appears stated age  HEENT: NCAT, mucous membranes moist.   Cardiovascular: S1 S2 auscultated, Regular rate and rhythm.  Respiratory: Clear to auscultation bilaterally with equal chest rise  Abdomen: Soft, nontender, nondistended, + bowel sounds  Extremities: warm dry without cyanosis clubbing or edema. LUE-forearm wrapped with ACE wrap  Neuro: AAOx3, nonfocal  Psych: Appropriate mood and affect  Discharge Instructions      Discharge Instructions    Discharge instructions    Complete by:  As directed   Patient will be discharged to [facility] facility.  Patient to continue physical as well as occupational therapy is recommended by the facility. Patient will need to continue her medications as prescribed. She will need to follow-up with her primary care physician within one week of discharge as well as Dr. Caralyn Guile, orthopedist.  Patient should follow a heart healthy/carb modified diet.            Medication List    TAKE these medications        docusate sodium 100 MG capsule  Commonly known as:  COLACE  Take 1 capsule (100 mg total) by mouth 2 (two) times daily.     feeding supplement (ENSURE) Pudg  Take 1 Container by mouth 2 (two) times daily between meals.     glimepiride 2 MG tablet  Commonly known as:  AMARYL  TAKE ONE TABLET  BY MOUTH ONE TIME DAILY     HYDROcodone-acetaminophen 5-325 MG per tablet  Commonly known as:  NORCO/VICODIN  Take 1-2 tablets by mouth every 4 (four) hours as needed for moderate pain.     metFORMIN 500 MG tablet  Commonly known as:  GLUCOPHAGE  Take 1 tablet (500 mg total) by mouth daily.     metoprolol tartrate 25 MG tablet  Commonly known as:  LOPRESSOR  TAKE ONE TABLET BY MOUTH TWICE DAILY     mometasone 50 MCG/ACT nasal spray  Commonly known as:  NASONEX  2 sprays per nostril qd     mupirocin ointment 2 %  Commonly known as:  BACTROBAN  Place into the nose 2 (two) times daily.     OSCAL 500/200 D-3 500-200 MG-UNIT per tablet  Generic drug:  calcium-vitamin D  Take 1 tablet by mouth daily with breakfast.     predniSONE 5 MG tablet  Commonly known as:  DELTASONE  Take 5 mg by mouth daily with breakfast.     rivaroxaban 20 MG Tabs tablet  Commonly known as:  XARELTO  Take 1 tablet (20 mg total) by mouth daily with supper.     sulfamethoxazole-trimethoprim 800-160 MG per tablet  Commonly known as:  BACTRIM DS  Take 1 tablet by mouth 2 (two) times daily.       Allergies  Allergen Reactions  . Advil [Ibuprofen] Shortness Of Breath and Swelling  . Azo [Phenazopyridine] Other (See Comments)    Unknown reaction  . Indocin [Indomethacin] Other (See Comments)    Unknown reaction  . Keflex [Cephalexin]   . Loratadine Other (  See Comments)    unknown  . Percocet [Oxycodone-Acetaminophen] Itching   Follow-up Information    Follow up with Linna Hoff, MD.   Specialty:  Orthopedic Surgery   Why:  09/17/2014 for wound check   Contact information:   [address] [place] [address] [phone]       Follow up with Chevis Pretty, FNP. Schedule an appointment as soon as possible for a visit in 1 week.   Specialty:  Nurse Practitioner   Why:  Hospital followup   Contact information:   [place]  [address] [phone]       Follow up with Chevis Pretty, [place]. Go on 09/23/2014.   Specialty:  Nurse Practitioner   Why:  Follow up on Thursday 09/23/14 at 11:30am   Contact information:   [place] [address] [phone]        The results of significant diagnostics from this hospitalization (including imaging, microbiology, ancillary and laboratory) are listed below for reference.    Significant Diagnostic Studies: Dg Chest Portable 1 View  09/13/2014   CLINICAL DATA:  Left lower lobectomy.  Sarcoid.  EXAM: PORTABLE CHEST - 1 VIEW  COMPARISON:  08/13/2014  FINDINGS: Mild cardiomegaly. Left lower lung atelectasis or infiltrate slightly increased since prior study. Cannot exclude small left effusion. No confluent opacity on the right. No acute bony abnormality. Sclerotic area within the right humeral head is stable.  IMPRESSION: No active disease.   Electronically Signed   By: Rolm Baptise M.D.   On: 09/13/2014 11:12    Microbiology: Recent Results (from the past 240 hour(s))  Blood culture (routine x 2)     Status: None (Preliminary result)   Collection Time: 09/13/14 11:00 AM  Result Value Ref Range Status   Specimen Description BLOOD ARM RIGHT  Final   Special Requests BOTTLES DRAWN AEROBIC AND ANAEROBIC 5CC  Final   Culture   Final           BLOOD CULTURE RECEIVED NO GROWTH TO DATE CULTURE WILL BE HELD FOR 5 DAYS BEFORE ISSUING A FINAL NEGATIVE REPORT Performed at [facility]    Report Status PENDING  Incomplete  Blood culture (routine x 2)     Status: None (Preliminary result)   Collection Time: 09/13/14 11:10 AM  Result Value Ref Range Status   Specimen Description BLOOD HAND LEFT  Final   Special Requests BOTTLES DRAWN AEROBIC AND ANAEROBIC 5CC  Final   Culture   Final           BLOOD CULTURE RECEIVED NO GROWTH TO DATE CULTURE WILL BE HELD FOR 5 DAYS BEFORE ISSUING A FINAL NEGATIVE REPORT Performed at [facility]    Report  Status PENDING  Incomplete  Urine culture     Status: None   Collection Time: 09/13/14  6:45 PM  Result Value Ref Range Status   Specimen Description URINE, CATHETERIZED  Final   Special Requests NONE  Final   Colony Count   Final    >=100,000 COLONIES/ML Performed at [facility]    Culture   Final    ESCHERICHIA COLI ENTEROCOCCUS SPECIES Performed at [facility]    Report Status 09/17/2014 FINAL  Final   Organism ID, Bacteria ESCHERICHIA COLI  Final   Organism ID, Bacteria ENTEROCOCCUS SPECIES  Final      Susceptibility   Escherichia coli - MIC*    AMPICILLIN 4 SENSITIVE Sensitive  CEFAZOLIN <=4 SENSITIVE Sensitive     CEFTRIAXONE <=1 SENSITIVE Sensitive     CIPROFLOXACIN <=0.25 SENSITIVE Sensitive     GENTAMICIN <=1 SENSITIVE Sensitive     LEVOFLOXACIN <=0.12 SENSITIVE Sensitive     NITROFURANTOIN 32 SENSITIVE Sensitive     TOBRAMYCIN <=1 SENSITIVE Sensitive     TRIMETH/SULFA <=20 SENSITIVE Sensitive     PIP/TAZO <=4 SENSITIVE Sensitive     * ESCHERICHIA COLI   Enterococcus species - MIC*    AMPICILLIN <=2 SENSITIVE Sensitive     LEVOFLOXACIN >=8 RESISTANT Resistant     NITROFURANTOIN <=16 SENSITIVE Sensitive     VANCOMYCIN 1 SENSITIVE Sensitive     TETRACYCLINE >=16 RESISTANT Resistant     * ENTEROCOCCUS SPECIES  Culture, routine-abscess     Status: None   Collection Time: 09/13/14  9:30 PM  Result Value Ref Range Status   Specimen Description ABSCESS FOREARM LEFT  Final   Special Requests PATIENT ON FOLLOWING VANCOMYCIN  Final   Gram Stain   Final    ABUNDANT WBC PRESENT, PREDOMINANTLY PMN NO SQUAMOUS EPITHELIAL CELLS SEEN FEW GRAM POSITIVE COCCI IN CLUSTERS Performed at [facility]    Culture   Final    ABUNDANT METHICILLIN RESISTANT STAPHYLOCOCCUS AUREUS Note: RIFAMPIN AND GENTAMICIN SHOULD NOT BE USED AS SINGLE DRUGS FOR TREATMENT OF STAPH INFECTIONS. This organism DOES NOT demonstrate inducible Clindamycin resistance in vitro.  CRITICAL RESULT CALLED TO, READ BACK BY AND VERIFIED WITH: ALEX SOLOMON @  1145 ON 09/16/14 BY COOKA Performed at [facility]    Report Status 09/16/2014 FINAL  Final   Organism ID, Bacteria METHICILLIN RESISTANT STAPHYLOCOCCUS AUREUS  Final      Susceptibility   Methicillin resistant staphylococcus aureus - MIC*    CLINDAMYCIN <=0.25 SENSITIVE Sensitive     ERYTHROMYCIN >=8 RESISTANT Resistant     GENTAMICIN <=0.5 SENSITIVE Sensitive     LEVOFLOXACIN 4 INTERMEDIATE Intermediate     OXACILLIN >=4 RESISTANT Resistant     PENICILLIN >=0.5 RESISTANT Resistant     RIFAMPIN <=0.5 SENSITIVE Sensitive     TRIMETH/SULFA <=10 SENSITIVE Sensitive     VANCOMYCIN 1 SENSITIVE Sensitive     TETRACYCLINE <=1 SENSITIVE Sensitive     * ABUNDANT METHICILLIN RESISTANT STAPHYLOCOCCUS AUREUS  Anaerobic culture     Status: None (Preliminary result)   Collection Time: 09/13/14  9:30 PM  Result Value Ref Range Status   Specimen Description ABSCESS FOREARM LEFT  Final   Special Requests PATIENT ON FOLLOWING VANCOMYCIN  Final   Gram Stain PENDING  Incomplete   Culture   Final    NO ANAEROBES ISOLATED; CULTURE IN PROGRESS FOR 5 DAYS Performed at [facility]    Report Status PENDING  Incomplete  MRSA PCR Screening     Status: Abnormal   Collection Time: 09/15/14  9:48 AM  Result Value Ref Range Status   MRSA by PCR POSITIVE (A) NEGATIVE Final    Comment:        The GeneXpert MRSA Assay (FDA approved for NASAL specimens only), is one component of a comprehensive MRSA colonization surveillance program. It is not intended to diagnose MRSA infection nor to guide or monitor treatment for MRSA infections. RESULT CALLED TO, READ BACK BY AND VERIFIED WITH: T. JACOBS RN 12:45 09/15/14 ([email or web address])      Labs: Basic Metabolic Panel:  Recent Labs Lab 09/13/14 1110 09/14/14 0542 09/14/14 1955 09/15/14 0539 09/16/14 0545  NA 139 140  --  139 140  K 3.7 2.9* 3.7 3.7 3.7  CL 108 114*   --  111 112  CO2 23 19  --  23 21  GLUCOSE 147* 115*  --  93 100*  BUN 13 9  --  6 11  CREATININE 0.67 0.61  --  0.64 0.60  CALCIUM 9.4 7.4*  --  8.5 8.2*  MG  --  1.3* 2.1  --   --    Liver Function Tests:  Recent Labs Lab 09/13/14 1110  AST 23  ALT 12  ALKPHOS 52  BILITOT 1.3*  PROT 6.9  ALBUMIN 3.0*   No results for input(s): LIPASE, AMYLASE in the last 168 hours. No results for input(s): AMMONIA in the last 168 hours. CBC:  Recent Labs Lab 09/13/14 1110 09/14/14 0542 09/15/14 0539 09/16/14 0545  WBC 21.3* 16.5* 13.6* 7.3  NEUTROABS 18.4*  --   --   --   HGB 13.1 10.5* 10.4* 9.9*  HCT 39.6 32.5* 32.1* 30.8*  MCV 96.4 99.1 97.0 97.5  PLT 175 146* 156 175   Cardiac Enzymes:  Recent Labs Lab 09/13/14 1110  TROPONINI 0.04*   BNP: BNP (last 3 results) No results for input(s): BNP in the last 8760 hours.  ProBNP (last 3 results) No results for input(s): PROBNP in the last 8760 hours.  CBG:  Recent Labs Lab 09/16/14 1150 09/16/14 1635 09/16/14 2138 09/17/14 0621 09/17/14 1107  GLUCAP 217* 185* 78 91 129*       Signed:  Teisha Trowbridge  [facility] 09/17/2014, 12:07 PM

## 2014-09-16 NOTE — Progress Notes (Signed)
[facility]                                                                              Patient Demographics  Elizabeth Drake, is a 74 y.o. female, DOB - [birth year], KDX:[id number]  Admit date - 09/13/2014   Admitting Physician Evalee Mutton Kristeen Mans, MD  Outpatient Primary MD for the patient is Chevis Pretty, [place]  LOS - 3   Chief Complaint  Patient presents with  . DVT  . Weakness  . Atrial Fibrillation      HPI on 09/13/2014 By Erin Hearing, NP with Dr. Oren Binet Elizabeth Drake is a 74 y.o. female, with history of diabetes, hypothyroidism, hypertension, sarcoidosis on chronic prednisone and overactive bladder. She also has significant physical deconditioning and complains repeatedly of a normalized as well as recurrent lower extremity weakness. She was recently seen in ER at the beginning of January for complaints of generalized weakness but because of significant findings she was discharged back to home. She returns again with generalized weakness. Upon evaluation she was found to be a new atrial fibrillation with RVR. She reports that she has been weak in her legs for at least 3 days. She also was complaining of pain in the left arm. She was found to have significant erythema and likely cellulitis of the left forearm. Her white count was 21,300. Blood cultures were obtained in the ER and she was started on vancomycin. When we initially examined her she was still atrial fibrillation with rates peaking up into the 120s so IV Cardizem infusion was ordered and cardiology was consulted. Later she converted back to sinus rhythm/sinus tachycardia.  Assessment & Plan   Sepsis secondary to Left arm Cellulitis/abscess  -Patient was febrile with leukocytosis and tachycardia upon admission however this has all resolved. -S/p I&D by Dr. Caralyn Guile on 09/14/2014 and will need to followup up on 2/12 for a wound check in the office -Initially placed on vancomycin and zosyn, will transition to  Bactrim -Blood Cultures show no growth to date -Left forearm abscess shows abundant WBCs present, Staphylococcus aureus; no anaerobes isolated  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 -Hemodynamically stable so continue home dose prednisone; no indication for stress dose steroids at this time  Diabetes mellitus, type II -Hemoglobin A1c 6.9 -Metformin and Amaryl held, but may be continued on discharge -Placed on insulin sliding scale during hospitalization  Hypertension -Continue beta blocker  OAB  -Supposedly urologist prescribed Toviaz in January 2016 however not on home medication list  Chronic pain syndrome (LEGS/BACK) -Continue home medications  History of pulmonary embolism -Continue Xarelto  Physical deconditioning -PT recommended rehabilitation however patient would like to go home instead of a nursing home. She will be discharged with home health  Moderate malnutrition  -Nutrition consultation appreciated, continue nutritional supplements  Code Status: DNR  Family Communication: None at bedside  Disposition Plan: Admitted.  Pending SNF  Time Spent in minutes   30 minutes  Procedures  Echocardiogram I&D  Consults   Cardiology Orthopedics  DVT Prophylaxis  Xarelto  Lab Results  Component Value Date   PLT 175 09/16/2014    Medications  Scheduled Meds: . docusate sodium  100 mg Oral BID  . feeding supplement (ENSURE)  1 Container Oral BID BM  . insulin aspart   0-15 Units Subcutaneous TID WC  . insulin aspart  0-5 Units Subcutaneous QHS  . metoprolol tartrate  25 mg Oral BID  . mupirocin ointment   Nasal BID  . piperacillin-tazobactam (ZOSYN)  IV  3.375 g Intravenous Q8H  . predniSONE  5 mg Oral Q breakfast  . rivaroxaban  20 mg Oral Q supper  . sodium chloride  3 mL Intravenous Q12H  . vancomycin  1,000 mg Intravenous Q24H   Continuous Infusions: . sodium chloride 75 mL/hr at 09/15/14 1757  . diltiazem (CARDIZEM) infusion Stopped (09/13/14 1309)   PRN Meds:.acetaminophen **OR** acetaminophen, alum & mag hydroxide-simeth, fluticasone, HYDROcodone-acetaminophen, morphine injection, ondansetron **OR** ondansetron (ZOFRAN) IV, polyethylene glycol  Antibiotics    Anti-infectives    Start     Dose/Rate Route Frequency Ordered Stop   09/16/14 0000  sulfamethoxazole-trimethoprim (BACTRIM DS) 800-160 MG per tablet     1 tablet Oral 2 times daily 09/16/14 1018     09/14/14 1100  vancomycin (VANCOCIN) IVPB 1000 mg/200 mL premix     1,000 mg 200 mL/hr over 60 Minutes Intravenous Every 24 hours 09/13/14 1227     09/14/14 0900  piperacillin-tazobactam (ZOSYN) IVPB 3.375 g     3.375 g 12.5 mL/hr over 240 Minutes Intravenous Every 8 hours 09/14/14 0832     09/13/14 1100  vancomycin (VANCOCIN) IVPB 1000 mg/200 mL premix     1,000 mg 200 mL/hr over 60 Minutes Intravenous  Once 09/13/14 1054 09/13/14 1218   09/13/14 1045  vancomycin (VANCOCIN) IVPB 1000 mg/200 mL premix  Status:  Discontinued     1,000 mg 200 mL/hr over 60 Minutes Intravenous  Once 09/13/14 1039 09/13/14 1054        Subjective:   Elizabeth Drake seen and examined today.  Patient denies chest pain, SOB, abdominal pain. She is very reluctant regarding SNF.    Objective:   Filed Vitals:   09/15/14 2300 09/16/14 0430 09/16/14 0946 09/16/14 1307  BP: 120/70 105/48 127/48 120/91  Pulse:  70 77 81  Temp:  97.5 F (36.4 C)  97.6 F (36.4 C)  TempSrc:  Oral  Oral  Resp:  16  18    Height:      Weight:  51 kg (112 lb 7 oz)    SpO2:  98%  100%    Wt Readings from Last 3 Encounters:  09/16/14 51 kg (112 lb 7 oz)  07/18/13 50.8 kg (111 lb 15.9 oz)  06/30/13 54.1 kg (119 lb 4.3 oz)     Intake/Output Summary (Last 24 hours) at 09/16/14 1331 Last data filed at 09/16/14 1230  Gross per 24 hour  Intake    480 ml  Output    650 ml  Net   -170 ml    Exam  General: Well developed, well nourished, NAD, appears stated age  HEENT: NCAT, mucous membranes moist.   Cardiovascular: S1 S2 auscultated, Regular rate and rhythm.  Respiratory: Clear to auscultation bilaterally with equal chest rise  Abdomen: Soft,  nontender, nondistended, + bowel sounds  Extremities: warm dry without cyanosis clubbing or edema. LUE-forearm wrapped  Neuro: AAOx3, nonfocal  Psych: Normal affect and demeanor with intact judgement and insight  Data Review   Micro Results Recent Results (from the past 240 hour(s))  Blood culture (routine x 2)     Status: None (Preliminary result)   Collection Time: 09/13/14 11:00 AM  Result Value Ref Range Status   Specimen Description BLOOD ARM RIGHT  Final   Special Requests BOTTLES DRAWN AEROBIC AND ANAEROBIC 5CC  Final   Culture   Final           BLOOD CULTURE RECEIVED NO GROWTH TO DATE CULTURE WILL BE HELD FOR 5 DAYS BEFORE ISSUING A FINAL NEGATIVE REPORT Performed at [facility]    Report Status PENDING  Incomplete  Blood culture (routine x 2)     Status: None (Preliminary result)   Collection Time: 09/13/14 11:10 AM  Result Value Ref Range Status   Specimen Description BLOOD HAND LEFT  Final   Special Requests BOTTLES DRAWN AEROBIC AND ANAEROBIC 5CC  Final   Culture   Final           BLOOD CULTURE RECEIVED NO GROWTH TO DATE CULTURE WILL BE HELD FOR 5 DAYS BEFORE ISSUING A FINAL NEGATIVE REPORT Performed at [facility]    Report Status PENDING  Incomplete  Urine culture     Status: None (Preliminary result)    Collection Time: 09/13/14  6:45 PM  Result Value Ref Range Status   Specimen Description URINE, CATHETERIZED  Final   Special Requests NONE  Final   Colony Count   Final    >=100,000 COLONIES/ML Performed at [facility]    Culture   Final    ESCHERICHIA COLI ENTEROCOCCUS SPECIES Performed at [facility]    Report Status PENDING  Incomplete   Organism ID, Bacteria ESCHERICHIA COLI  Final      Susceptibility   Escherichia coli - MIC*    AMPICILLIN 4 SENSITIVE Sensitive     CEFAZOLIN <=4 SENSITIVE Sensitive     CEFTRIAXONE <=1 SENSITIVE Sensitive     CIPROFLOXACIN <=0.25 SENSITIVE Sensitive     GENTAMICIN <=1 SENSITIVE Sensitive     LEVOFLOXACIN <=0.12 SENSITIVE Sensitive     NITROFURANTOIN 32 SENSITIVE Sensitive     TOBRAMYCIN <=1 SENSITIVE Sensitive     TRIMETH/SULFA <=20 SENSITIVE Sensitive     PIP/TAZO <=4 SENSITIVE Sensitive     * ESCHERICHIA COLI  Culture, routine-abscess     Status: None   Collection Time: 09/13/14  9:30 PM  Result Value Ref Range Status   Specimen Description ABSCESS FOREARM LEFT  Final   Special Requests PATIENT ON FOLLOWING VANCOMYCIN  Final   Gram Stain   Final    ABUNDANT WBC PRESENT, PREDOMINANTLY PMN NO SQUAMOUS EPITHELIAL CELLS SEEN FEW GRAM POSITIVE COCCI IN CLUSTERS Performed at [facility]    Culture   Final    ABUNDANT METHICILLIN RESISTANT STAPHYLOCOCCUS AUREUS Note: RIFAMPIN AND GENTAMICIN SHOULD NOT BE USED AS SINGLE DRUGS FOR TREATMENT OF STAPH INFECTIONS. This organism DOES NOT demonstrate inducible Clindamycin resistance in vitro. CRITICAL RESULT CALLED TO, READ BACK BY AND VERIFIED WITH: ALEX SOLOMON @  1145 ON 09/16/14 BY COOKA Performed at [facility]    Report Status 09/16/2014 FINAL  Final   Organism ID, Bacteria METHICILLIN RESISTANT STAPHYLOCOCCUS AUREUS  Final      Susceptibility   Methicillin resistant staphylococcus  aureus - MIC*    CLINDAMYCIN <=0.25 SENSITIVE Sensitive      ERYTHROMYCIN >=8 RESISTANT Resistant     GENTAMICIN <=0.5 SENSITIVE Sensitive     LEVOFLOXACIN 4 INTERMEDIATE Intermediate     OXACILLIN >=4 RESISTANT Resistant     PENICILLIN >=0.5 RESISTANT Resistant     RIFAMPIN <=0.5 SENSITIVE Sensitive     TRIMETH/SULFA <=10 SENSITIVE Sensitive     VANCOMYCIN 1 SENSITIVE Sensitive     TETRACYCLINE <=1 SENSITIVE Sensitive     * ABUNDANT METHICILLIN RESISTANT STAPHYLOCOCCUS AUREUS  Anaerobic culture     Status: None (Preliminary result)   Collection Time: 09/13/14  9:30 PM  Result Value Ref Range Status   Specimen Description ABSCESS FOREARM LEFT  Final   Special Requests PATIENT ON FOLLOWING VANCOMYCIN  Final   Gram Stain PENDING  Incomplete   Culture   Final    NO ANAEROBES ISOLATED; CULTURE IN PROGRESS FOR 5 DAYS Performed at [facility]    Report Status PENDING  Incomplete  MRSA PCR Screening     Status: Abnormal   Collection Time: 09/15/14  9:48 AM  Result Value Ref Range Status   MRSA by PCR POSITIVE (A) NEGATIVE Final    Comment:        The GeneXpert MRSA Assay (FDA approved for NASAL specimens only), is one component of a comprehensive MRSA colonization surveillance program. It is not intended to diagnose MRSA infection nor to guide or monitor treatment for MRSA infections. RESULT CALLED TO, READ BACK BY AND VERIFIED WITH: T. JACOBS RN 12:45 09/15/14 ([email or web address])     Radiology Reports Dg Chest Portable 1 View  09/13/2014   CLINICAL DATA:  Left lower lobectomy.  Sarcoid.  EXAM: PORTABLE CHEST - 1 VIEW  COMPARISON:  08/13/2014  FINDINGS: Mild cardiomegaly. Left lower lung atelectasis or infiltrate slightly increased since prior study. Cannot exclude small left effusion. No confluent opacity on the right. No acute bony abnormality. Sclerotic area within the right humeral head is stable.  IMPRESSION: No active disease.   Electronically Signed   By: Rolm Baptise M.D.   On: 09/13/2014 11:12    CBC  Recent Labs Lab  09/13/14 1110 09/14/14 0542 09/15/14 0539 09/16/14 0545  WBC 21.3* 16.5* 13.6* 7.3  HGB 13.1 10.5* 10.4* 9.9*  HCT 39.6 32.5* 32.1* 30.8*  PLT 175 146* 156 175  MCV 96.4 99.1 97.0 97.5  MCH 31.9 32.0 31.4 31.3  MCHC 33.1 32.3 32.4 32.1  RDW 12.4 12.2 12.3 12.0  LYMPHSABS 1.1  --   --   --   MONOABS 1.8*  --   --   --   EOSABS 0.0  --   --   --   BASOSABS 0.0  --   --   --     Chemistries   Recent Labs Lab 09/13/14 1110 09/14/14 0542 09/14/14 1955 09/15/14 0539 09/16/14 0545  NA 139 140  --  139 140  K 3.7 2.9* 3.7 3.7 3.7  CL 108 114*  --  111 112  CO2 23 19  --  23 21  GLUCOSE 147* 115*  --  93 100*  BUN 13 9  --  6 11  CREATININE 0.67 0.61  --  0.64 0.60  CALCIUM 9.4 7.4*  --  8.5 8.2*  MG  --  1.3* 2.1  --   --   AST 23  --   --   --   --   ALT 12  --   --   --   --  ALKPHOS 52  --   --   --   --   BILITOT 1.3*  --   --   --   --    ------------------------------------------------------------------------------------------------------------------ estimated creatinine clearance is 48.9 mL/min (by C-G formula based on Cr of 0.6). ------------------------------------------------------------------------------------------------------------------  Recent Labs  09/13/14 1334  HGBA1C 6.9*   ------------------------------------------------------------------------------------------------------------------ No results for input(s): CHOL, HDL, LDLCALC, TRIG, CHOLHDL, LDLDIRECT in the last 72 hours. ------------------------------------------------------------------------------------------------------------------ No results for input(s): TSH, T4TOTAL, T3FREE, THYROIDAB in the last 72 hours.  Invalid input(s): FREET3 ------------------------------------------------------------------------------------------------------------------ No results for input(s): VITAMINB12, FOLATE, FERRITIN, TIBC, IRON, RETICCTPCT in the last 72 hours.  Coagulation profile No results for  input(s): INR, PROTIME in the last 168 hours.  No results for input(s): DDIMER in the last 72 hours.  Cardiac Enzymes  Recent Labs Lab 09/13/14 1110  TROPONINI 0.04*   ------------------------------------------------------------------------------------------------------------------ Invalid input(s): POCBNP    Deny Chevez D.O. on 09/16/2014 at 1:31 PM  Between 7am to 7pm - Pager - [phone]  After 7pm go to [email or web address] - password [email or web address]  And look for the night coverage person covering for me after hours  [facility] Office  [phone]

## 2014-09-16 NOTE — Clinical Social Work Note (Signed)
Patient is now agreeable to SNF as long as it is near her home in [place].  CSW initiated bed search in [place].  CSW will continue to follow.  Domenica Reamer, [place] Social Worker [phone]

## 2014-09-16 NOTE — Progress Notes (Signed)
Physical Therapy Treatment Patient Details Name: DAJIA GUNNELS MRN: [id number] DOB: 06-13-1941 Today's Date: 09/16/2014    History of Present Illness Jacki Couse is a 74 y.o. female, with history of diabetes, hypothyroidism, hypertension, sarcoidosis on chronic prednisone and overactive bladder. She also has significant physical deconditioning. She was found to be a new atrial fibrillation with RVR. She reports that she has been weak in her legs for at least 3 days. She also was complaining of pain in the left arm. She was found to have significant erythema and likely cellulitis of the left forearm.Underwent I & D left forearm on 09/14/14.    PT Comments    Ms.Hastings was very eager to practice transfers to [facility] today. She continues to need assist with all transfers and was unaware of incontinent stool today with total assist for pericare. Pt agreeable at this time for SNF as she recognizes deficits with duplicating home environment with W/C. Encouraged Ms.Pellegrin to continue moving in bed and for OOB daily and will continue to follow to maximize mobility and safety with transfers but recommend SNF.   Follow Up Recommendations  SNF     Equipment Recommendations       Recommendations for Other Services       Precautions / Restrictions Precautions Precautions: Fall    Mobility  Bed Mobility Overal bed mobility: Needs Assistance Bed Mobility: Supine to Sit     Supine to sit: Min assist     General bed mobility comments: HHA to elevate trunk from surface and cues to scoot fully to EOB. mod I for rolling with assist of rails bil for pericare due to incontinent stool on arrival and after pivot transfer.  Transfers Overall transfer level: Needs assistance   Transfers: Squat Pivot Transfers     Squat pivot transfers: Min assist     General transfer comment: assist to set up room as well as to clear buttock from bed to WC, WC to bed and Bed to recliner with assist for sequence as pt  initially grasping bil rails facing chair and assist to pivot 90degrees with support at torso and stability for balance. Pt incontinent of stool again during 2nd pivot with assist for pericare again  Ambulation/Gait                 Stairs            Wheelchair Mobility    Modified Rankin (Stroke Patients Only)       Balance     Sitting balance-Leahy Scale: Fair                              Cognition Arousal/Alertness: Awake/alert Behavior During Therapy: WFL for tasks assessed/performed Overall Cognitive Status: Impaired/Different from baseline Area of Impairment: Problem solving             Problem Solving: Slow processing General Comments: pt incontinent of stool and unaware    Exercises      General Comments        Pertinent Vitals/Pain Pain Assessment: No/denies pain    Home Living                      Prior Function            PT Goals (current goals can now be found in the care plan section) Progress towards PT goals: Progressing toward goals    Frequency  PT Plan Current plan remains appropriate    Co-evaluation             End of Session   Activity Tolerance: Patient tolerated treatment well Patient left: in chair;with call bell/phone within reach     Time: 1057-1135 PT Time Calculation (min) (ACUTE ONLY): 38 min  Charges:  $Therapeutic Activity: 23-37 mins $Self Care/Home Management: 8-22                    G Codes:      Melford Aase 09/20/2014, 11:43 AM Elwyn Reach, [place]

## 2014-09-16 NOTE — Evaluation (Signed)
Occupational Therapy Evaluation Patient Details Name: Elizabeth Drake MRN: [id number] DOB: 11-Apr-1941 Today's Date: 09/16/2014    History of Present Illness Elizabeth Drake is a 74 y.o. female, with history of diabetes, hypothyroidism, hypertension, sarcoidosis on chronic prednisone and overactive bladder. She also has significant physical deconditioning. She was found to be a new atrial fibrillation with RVR. She reports that she has been weak in her legs for at least 3 days. She also was complaining of pain in the left arm. She was found to have significant erythema and likely cellulitis of the left forearm.Underwent I & D left forearm on 09/14/14.   Clinical Impression   Pt was performing ADL and ADL transfers with at a modified independent level prior to admission.  Pt presents with generalized weakness, impaired balance and decreased awareness of deficits and safety.  Pt is w/c bound at baseline.  She has all necessary DME at home, but family is not able to assist with personal care and transfers.  Recommend SNF for ST rehab.  Pt is agreeable.    Follow Up Recommendations  SNF;Supervision/Assistance - 24 hour    Equipment Recommendations       Recommendations for Other Services       Precautions / Restrictions Precautions Precautions: Fall Restrictions Weight Bearing Restrictions: No      Mobility Bed Mobility      General bed mobility comments: pt up in chair  Transfers Overall transfer level: Needs assistance Equipment used: 1 person hand held assist Transfers: Squat Pivot Transfers     Squat pivot transfers: Min assist     General transfer comment: Pt heavily reliant on UEs for raising trunk and supporting weight, stood holding arms of chair and assisted with pivot    Balance     Sitting balance-Leahy Scale: Fair       Standing balance-Leahy Scale: Poor Standing balance comment: unable to release UEs to perform ADL                            ADL  Overall ADL's : Needs assistance/impaired Eating/Feeding: Independent;Sitting   Grooming: Set up;Sitting   Upper Body Bathing: Supervision/ safety;Sitting   Lower Body Bathing: Moderate assistance;Sit to/from stand   Upper Body Dressing : Supervision/safety;Sitting   Lower Body Dressing: Moderate assistance;Sit to/from stand Lower Body Dressing Details (indicate cue type and reason): able to donn and doff socks and simulate starting pants over feet, assist to pull up Toilet Transfer: Minimal assistance;Stand-pivot;BSC   Toileting- Clothing Manipulation and Hygiene: Total assistance;Sit to/from stand         General ADL Comments: Pt stating she does not want to go to SNF, but is agreeable.     Vision     Perception     Praxis      Pertinent Vitals/Pain Pain Assessment: No/denies pain     Hand Dominance Right   Extremity/Trunk Assessment Upper Extremity Assessment Upper Extremity Assessment: LUE deficits/detail LUE Deficits / Details: bandaged from I&D   Lower Extremity Assessment Lower Extremity Assessment: Defer to PT evaluation   Cervical / Trunk Assessment Cervical / Trunk Exceptions: severe scoliosis   Communication Communication Communication: No difficulties   Cognition Arousal/Alertness: Awake/alert Behavior During Therapy: WFL for tasks assessed/performed Overall Cognitive Status: Impaired/Different from baseline Area of Impairment: Problem solving;Safety/judgement         Safety/Judgement: Decreased awareness of deficits;Decreased awareness of safety   Problem Solving: Slow processing General Comments: pt stated she  could have taken care of her incontinence that occurred earlier today when in fact she needed total assist.   General Comments       Exercises       Shoulder Instructions      Home Living Family/patient expects to be discharged to:: Private residence Living Arrangements: Spouse/significant other;Children Available Help at  Discharge: Family;Available PRN/intermittently Type of Home: House Home Access: Ramped entrance     Home Layout: Two level;Able to live on main level with bedroom/bathroom;Laundry or work area in Building surveyor of Steps: Recruitment consultant Shower/Tub: Occupational psychologist: Handicapped height     Home Equipment: Wheelchair - Education officer, community - power;Shower seat - built in;Grab bars - tub/shower;Hand held shower head   Additional Comments: pt's son lives with she and her husband but works full time. Her husband is 79 yo and currently has a broken arm. They have a chair lift to the basement.      Prior Functioning/Environment Level of Independence: Needs assistance  Gait / Transfers Assistance Needed: does not ambulate, was independent with transfers ADL's / Homemaking Assistance Needed: husband and son help with meals, she has a housekeeper for cleaning, was able to perform self care at a modified independent level        OT Diagnosis: Generalized weakness   OT Problem List: Decreased strength;Decreased activity tolerance;Impaired balance (sitting and/or standing);Decreased cognition;Decreased safety awareness;Decreased knowledge of use of DME or AE   OT Treatment/Interventions: Self-care/ADL training;DME and/or AE instruction;Therapeutic activities;Patient/family education    OT Goals(Current goals can be found in the care plan section) Acute Rehab OT Goals Patient Stated Goal: return home OT Goal Formulation: With patient Time For Goal Achievement: 09/30/14 Potential to Achieve Goals: Good ADL Goals Pt Will Perform Lower Body Bathing: with supervision;sitting/lateral leans Pt Will Perform Lower Body Dressing: with supervision;sitting/lateral leans;sit to/from stand Pt Will Transfer to Toilet: with supervision;squat pivot transfer;bedside commode Pt Will Perform Toileting - Clothing Manipulation and hygiene: with supervision;sitting/lateral  leans  OT Frequency: Min 2X/week   Barriers to D/C: Decreased caregiver support          Co-evaluation              End of Session    Activity Tolerance: Patient tolerated treatment well Patient left: in chair;with call bell/phone within reach   Time: 1232-1300 OT Time Calculation (min): 28 min Charges:  OT General Charges $OT Visit: 1 Procedure OT Evaluation $Initial OT Evaluation Tier I: 1 Procedure OT Treatments $Self Care/Home Management : 8-22 mins G-Codes:    Malka So 09/16/2014, 1:12 PM  [phone]

## 2014-09-16 NOTE — Clinical Social Work Note (Signed)
Patient chooses bed at [facility]- anticipated DC 09/17/14  CSW confirmed bed offer with facility.  CSW will continue to follow.  Domenica Reamer, [place] Social Worker [phone]

## 2014-09-16 NOTE — Care Management Note (Unsigned)
    Page 1 of 1   09/16/2014     2:29:29 PM CARE MANAGEMENT NOTE 09/16/2014  Patient:  Elizabeth Drake, Elizabeth Drake   Account Number:  [id number]  Date Initiated:  09/16/2014  Documentation initiated by:  Whitman Hero  Subjective/Objective Assessment:   Pt from home admitted with a.fib with rvr and (l)  arm abcess- s/p i&d.     Action/Plan:   SNF   Anticipated DC Date:  09/16/2014   Anticipated DC Plan:  [place]  CM consult      Choice offered to / List presented to:             Status of service:  In process, will continue to follow Medicare Important Message given?  YES (If response is "NO", the following Medicare IM given date fields will be blank) Date Medicare IM given:  09/16/2014 Medicare IM given by:  Whitman Hero Date Additional Medicare IM given:   Additional Medicare IM given by:    Discharge Disposition:  [place]  Per UR Regulation:  Reviewed for med. necessity/level of care/duration of stay  If discussed at [place] of Stay Meetings, dates discussed:    Comments:

## 2014-09-16 NOTE — Progress Notes (Signed)
CRITICAL VALUE ALERT  Critical value received:  Abcess culturing positive for MRSA  Date of notification:  09/16/2014  Time of notification:  7628   Critical value read back:Yes.    Nurse who received alert:  Charm Barges   MD notified (1st page):  Cristal Ford   Time of first page:  [place] page, anticipated result

## 2014-09-16 NOTE — Discharge Instructions (Signed)
KEEP BANDAGE ON UNTIL Friday WILL NEED TO SEE DR. ORTMANN'S STAFF IN OFFICE ON Friday FOR WOUND CHECK AND BANDAGE CHANGE DR. ORTMANN CAN BE REACHED BY CELL PHON [phone] IF ANY QUESTIONS KEEP HAND ELEVATED DO NOT REMOVE BANDAGE   Abscess An abscess is an infected area that contains a collection of pus and debris.It can occur in almost any part of the body. An abscess is also known as a furuncle or boil. CAUSES  An abscess occurs when tissue gets infected. This can occur from blockage of oil or sweat glands, infection of hair follicles, or a minor injury to the skin. As the body tries to fight the infection, pus collects in the area and creates pressure under the skin. This pressure causes pain. People with weakened immune systems have difficulty fighting infections and get certain abscesses more often.  SYMPTOMS Usually an abscess develops on the skin and becomes a painful mass that is red, warm, and tender. If the abscess forms under the skin, you may feel a moveable soft area under the skin. Some abscesses break open (rupture) on their own, but most will continue to get worse without care. The infection can spread deeper into the body and eventually into the bloodstream, causing you to feel ill.  DIAGNOSIS  Your caregiver will take your medical history and perform a physical exam. A sample of fluid may also be taken from the abscess to determine what is causing your infection. TREATMENT  Your caregiver may prescribe antibiotic medicines to fight the infection. However, taking antibiotics alone usually does not cure an abscess. Your caregiver may need to make a small cut (incision) in the abscess to drain the pus. In some cases, gauze is packed into the abscess to reduce pain and to continue draining the area. HOME CARE INSTRUCTIONS   Only take over-the-counter or prescription medicines for pain, discomfort, or fever as directed by your caregiver.  If you were prescribed antibiotics, take  them as directed. Finish them even if you start to feel better.  If gauze is used, follow your caregiver's directions for changing the gauze.  To avoid spreading the infection:  Keep your draining abscess covered with a bandage.  Wash your hands well.  Do not share personal care items, towels, or whirlpools with others.  Avoid skin contact with others.  Keep your skin and clothes clean around the abscess.  Keep all follow-up appointments as directed by your caregiver. SEEK MEDICAL CARE IF:   You have increased pain, swelling, redness, fluid drainage, or bleeding.  You have muscle aches, chills, or a general ill feeling.  You have a fever. MAKE SURE YOU:   Understand these instructions.  Will watch your condition.  Will get help right away if you are not doing well or get worse. Document Released: 05/02/2005 Document Revised: 01/22/2012 Document Reviewed: 10/05/2011 [facility] Patient Information 2015 [place], [place]. This information is not intended to replace advice given to you by your health care provider. Make sure you discuss any questions you have with your health care provider.

## 2014-09-16 NOTE — Clinical Social Work Placement (Addendum)
Clinical Social Work Department CLINICAL SOCIAL WORK PLACEMENT NOTE 09/16/2014  Patient:  Elizabeth Drake, Elizabeth Drake  Account Number:  [id number] Admit date:  09/13/2014  Clinical Social Worker:  Domenica Reamer, CLINICAL SOCIAL WORKER  Date/time:  09/16/2014 04:37 PM  Clinical Social Work is seeking post-discharge placement for this patient at the following level of care:   SKILLED NURSING   (*CSW will update this form in Epic as items are completed)   09/16/2014  Patient/family provided with [place] Department of Clinical Social Work's list of facilities offering this level of care within the geographic area requested by the patient (or if unable, by the patient's family).  09/16/2014  Patient/family informed of their freedom to choose among providers that offer the needed level of care, that participate in Medicare, Medicaid or managed care program needed by the patient, have an available bed and are willing to accept the patient.  09/16/2014  Patient/family informed of [facility]' ownership interest in [facility], as well as of the fact that they are under no obligation to receive care at this facility.  PASARR submitted to EDS on 09/16/2014 PASARR number received on 09/16/2014  FL2 transmitted to all facilities in geographic area requested by pt/family on  09/16/2014 FL2 transmitted to all facilities within larger geographic area on   Patient informed that his/her managed care company has contracts with or will negotiate with  certain facilities, including the following:     Patient/family informed of bed offers received:  09/16/2014 Patient chooses bed at [facility] Physician recommends and patient chooses bed at    Patient to be transferred to [facility] on  09/17/2014 Patient to be transferred to facility by ptar Patient and family notified of transfer on 09/17/14 Name of family member notified:  Spouse (pt to notify)  The following  physician request were entered in Epic:   Additional Comments: Domenica Reamer, [place] Social Worker [phone]

## 2014-09-16 NOTE — Progress Notes (Signed)
Assessment: 2 YOF presented with complaint of weakness for two days PTA. Pharmacy consulted to manage vancomycin for sepsis. Baseline labs reviewed. Admit Complaint: weakness x2 days, possible DVT in left arm  Anticoagulation: Xarelto PTA for hx PE/DVT   Infectious Disease: Vanc/Zosyn for forearm cellulitis (sepsis), WBC trending down, low grade fever S/p I + D 2/9 Vanc 2/8 >> Zosyn 2/9> Abscess culture 2/8> staph aureus --> Sensitivities still N/A BC X 2 2/8>ngtd Urine cx 2/8> E Coli. Sensitive to Septra(PAN sens)   Cardiovascular: no hx - Afib in ED monitor, on diltiazem drip, metoprolol Endocrinology: DM - CBGs < 180, SSI Gastrointestinal / Nutrition: LFTs WNL, tbili mildly elevated Nephrology: SCr stable, CrCL 51 ml/min, lytes replaced Pulm: Sarcoidosis, chronic prednisone Hematology / Oncology: H/H/plts WNL PTA Medication Issues: Amaryl, metformin on hold Best Practices: Xarelto  Plan:  Vancomycin 1 gram iv Q 24 hours Zosyn 4 hr infusion Xarelto 20 mg po daily  Follow up Staph sensitivities and E Coli senstivities

## 2014-09-17 DIAGNOSIS — I4891 Unspecified atrial fibrillation: Secondary | ICD-10-CM | POA: Diagnosis not present

## 2014-09-17 DIAGNOSIS — E1151 Type 2 diabetes mellitus with diabetic peripheral angiopathy without gangrene: Secondary | ICD-10-CM | POA: Diagnosis not present

## 2014-09-17 DIAGNOSIS — L03114 Cellulitis of left upper limb: Secondary | ICD-10-CM | POA: Diagnosis not present

## 2014-09-17 DIAGNOSIS — I10 Essential (primary) hypertension: Secondary | ICD-10-CM | POA: Diagnosis not present

## 2014-09-17 DIAGNOSIS — E039 Hypothyroidism, unspecified: Secondary | ICD-10-CM | POA: Diagnosis not present

## 2014-09-17 DIAGNOSIS — E43 Unspecified severe protein-calorie malnutrition: Secondary | ICD-10-CM | POA: Diagnosis not present

## 2014-09-17 DIAGNOSIS — R41841 Cognitive communication deficit: Secondary | ICD-10-CM | POA: Diagnosis not present

## 2014-09-17 DIAGNOSIS — B351 Tinea unguium: Secondary | ICD-10-CM | POA: Diagnosis not present

## 2014-09-17 DIAGNOSIS — D649 Anemia, unspecified: Secondary | ICD-10-CM | POA: Diagnosis not present

## 2014-09-17 DIAGNOSIS — L0390 Cellulitis, unspecified: Secondary | ICD-10-CM | POA: Diagnosis not present

## 2014-09-17 DIAGNOSIS — L8995 Pressure ulcer of unspecified site, unstageable: Secondary | ICD-10-CM | POA: Diagnosis not present

## 2014-09-17 DIAGNOSIS — L03113 Cellulitis of right upper limb: Secondary | ICD-10-CM | POA: Diagnosis not present

## 2014-09-17 DIAGNOSIS — E785 Hyperlipidemia, unspecified: Secondary | ICD-10-CM | POA: Diagnosis not present

## 2014-09-17 DIAGNOSIS — M6281 Muscle weakness (generalized): Secondary | ICD-10-CM | POA: Diagnosis not present

## 2014-09-17 DIAGNOSIS — I481 Persistent atrial fibrillation: Secondary | ICD-10-CM | POA: Diagnosis not present

## 2014-09-17 DIAGNOSIS — N3281 Overactive bladder: Secondary | ICD-10-CM | POA: Diagnosis not present

## 2014-09-17 DIAGNOSIS — E1142 Type 2 diabetes mellitus with diabetic polyneuropathy: Secondary | ICD-10-CM | POA: Diagnosis not present

## 2014-09-17 DIAGNOSIS — E118 Type 2 diabetes mellitus with unspecified complications: Secondary | ICD-10-CM | POA: Diagnosis not present

## 2014-09-17 DIAGNOSIS — D869 Sarcoidosis, unspecified: Secondary | ICD-10-CM | POA: Diagnosis not present

## 2014-09-17 DIAGNOSIS — R5381 Other malaise: Secondary | ICD-10-CM | POA: Diagnosis not present

## 2014-09-17 DIAGNOSIS — E119 Type 2 diabetes mellitus without complications: Secondary | ICD-10-CM | POA: Diagnosis not present

## 2014-09-17 DIAGNOSIS — G894 Chronic pain syndrome: Secondary | ICD-10-CM | POA: Diagnosis not present

## 2014-09-17 DIAGNOSIS — Z79899 Other long term (current) drug therapy: Secondary | ICD-10-CM | POA: Diagnosis not present

## 2014-09-17 DIAGNOSIS — Z4789 Encounter for other orthopedic aftercare: Secondary | ICD-10-CM | POA: Diagnosis not present

## 2014-09-17 DIAGNOSIS — R6520 Severe sepsis without septic shock: Secondary | ICD-10-CM | POA: Diagnosis not present

## 2014-09-17 DIAGNOSIS — E876 Hypokalemia: Secondary | ICD-10-CM | POA: Diagnosis not present

## 2014-09-17 DIAGNOSIS — L0291 Cutaneous abscess, unspecified: Secondary | ICD-10-CM | POA: Diagnosis not present

## 2014-09-17 DIAGNOSIS — L84 Corns and callosities: Secondary | ICD-10-CM | POA: Diagnosis not present

## 2014-09-17 LAB — GLUCOSE, CAPILLARY
GLUCOSE-CAPILLARY: 129 mg/dL — AB (ref 70–99)
Glucose-Capillary: 91 mg/dL (ref 70–99)

## 2014-09-17 LAB — URINE CULTURE: Colony Count: >=100000

## 2014-09-17 MED ORDER — HYDROCODONE-ACETAMINOPHEN 5-325 MG PO TABS: 1.0000 | ORAL_TABLET | ORAL | Status: DC | PRN

## 2014-09-17 NOTE — Progress Notes (Signed)
Nurse called requesting a 1st dressing change for the patient's left arm wound.  The original dressing was removed.  The dressing did have an area of 3x3" that had drained through the guaze and onto the ACE bandage.    On examination the area looked to be healing appropriately with no active drainage evident.  The area was then covered with a Adaptic dressing, 4x4 gauze, rolled gauze and ACE bandage.  She tolerated this well and states that she has minimal pain to the area.  She was DNVI in the left hand after the dressing was applied.    She will follow up with Dr. Caralyn Guile as previous discussed.  She will be leaving later today for skilled nursing facility.     Elizabeth Pugh Kenniel Bergsma   PA-C  09/17/2014, 2:39 PM

## 2014-09-17 NOTE — Progress Notes (Signed)
1445: Report called off to [place] at [facility]. Francis Gaines Quintessa Simmerman RN.

## 2014-09-17 NOTE — Clinical Social Work Note (Signed)
Patient will discharge to [facility] Anticipated discharge date:09/17/14 Family notified:pt to notify Transportation by PTAR- scheduled for 3:30pm   CSW signing off.  Domenica Reamer, [place] Social Worker [phone]

## 2014-09-17 NOTE — Progress Notes (Signed)
Pt discharge education and instructions completed with pt. Pt discharged to [facility]. Pt wedding band picked up from security and handed to pt. Pt IV and telemetry removed; pt transported off unit via stretcher with belongings to the side. Francis Gaines Demetrius Barrell RN.

## 2014-09-19 LAB — ANAEROBIC CULTURE

## 2014-09-19 LAB — CULTURE, BLOOD (ROUTINE X 2)
CULTURE: NO GROWTH
Culture: NO GROWTH

## 2014-09-20 ENCOUNTER — Other Ambulatory Visit: Payer: Self-pay | Admitting: *Deleted

## 2014-09-20 MED ORDER — HYDROCODONE-ACETAMINOPHEN 5-325 MG PO TABS: ORAL_TABLET | ORAL | Status: DC

## 2014-09-20 NOTE — Telephone Encounter (Signed)
[facility] 

## 2014-09-22 ENCOUNTER — Non-Acute Institutional Stay (SKILLED_NURSING_FACILITY): Payer: Medicare Other | Admitting: Internal Medicine

## 2014-09-22 DIAGNOSIS — I481 Persistent atrial fibrillation: Secondary | ICD-10-CM | POA: Diagnosis not present

## 2014-09-22 DIAGNOSIS — E1142 Type 2 diabetes mellitus with diabetic polyneuropathy: Secondary | ICD-10-CM

## 2014-09-22 DIAGNOSIS — M6281 Muscle weakness (generalized): Secondary | ICD-10-CM

## 2014-09-22 DIAGNOSIS — I4819 Other persistent atrial fibrillation: Secondary | ICD-10-CM

## 2014-09-23 ENCOUNTER — Ambulatory Visit: Payer: Medicare Other | Admitting: Nurse Practitioner

## 2014-09-27 NOTE — Progress Notes (Signed)
Patient ID: Elizabeth Drake, female   DOB: [birth year], 74 y.o.   MRN: [id number]                 HISTORY & PHYSICAL  DATE:  09/22/2014            FACILITY: [facility]                   LEVEL OF CARE:   SNF   CHIEF COMPLAINT:  Admission to SNF, post stay at [facility], 09/13/2014 through 09/17/2014.    HISTORY OF PRESENT ILLNESS:  This is a frail, 74 year-old woman who lives with her husband in [place], I believe.  She tells me that she was in this building three or four years ago with a fractured foot.    On this occasion, she deteriorated fairly rapidly over 3-4 days prior to admission with complaints of generalized weakness.    In the ER, she was found to be in atrial fibrillation which was new onset.    She was also found to have an abscess on her left arm and underwent an I&D which showed MRSA.  Her white count was 21,300.  Blood culture was negative.    She was also found to have E.coli and Enterococcus in her urine.    As noted, her left arm abscess, which was I&D'ed, grew MRSA.    She was noted to have AFib.  She required a Cardizem drip.  Noted to have a CHADS-VASc score of 5.  She was put on Lopressor and Xarelto.  An echocardiogram was done that showed an EF of 60% with no wall motion abnormalities.  She was found to have hypokalemia and hypomagnesemia, which were replaced.    With regards to her overall disability, she has a history of sarcoidosis.  On chronic steroids, prednisone 5 mg.    She is also a type 2 diabetic.  However, her hemoglobin A1c was 6.9.  Her metformin and Amaryl were held, I think to restart on discharge.    She is here for rehabilitation.     The patient tells me that at home, she is essentially wheelchair-bound.  She can stand and pivot transfer to her wheelchair from bed.  She is able to go from bed to commode.  She is able to do her own basic ADLs, but her husband takes care of most everything else.  Apparently, he has recently fractured his  arm.    PAST MEDICAL HISTORY/PROBLEM LIST:                        Sarcoidosis.    Hiatal hernia.    Type 2 diabetes.    Osteoporosis.    History of a DVT with PE.    PAST SURGICAL HISTORY:                         Abdominal hysterectomy.    Lung lobectomy of the left lower lobe.    Recent I&D of her left arm.    CURRENT MEDICATIONS:  Medication list is reviewed.                 Colace 100 b.i.d.        Nutritional supplement b.i.d.       Amaryl 2 mg daily.    Norco 5/325, 1-2 tablets q.4 hours p.r.n.    Metformin 500 b.i.d.       Metoprolol  25 twice daily.    Nasonex 2 sprays per nostril q.d. for allergic rhinitis.     Bactroban to the nose.     Os-Cal plus D 500/200, 1 tablet daily.    Prednisone 5 q.d.      Xarelto 20 q.d.      Septra DS 1 p.o. two times daily with no specified duration.    SOCIAL HISTORY:                   HOUSING:  As noted, the patient lives in [place] in her own home.  Her husband and son are apparently there.   FUNCTIONAL STATUS:  She is basically a walker only for pivot transfer to wheelchair.    FAMILY HISTORY:                MOTHER:  Heart disease in her mother.  Deceased.     FATHER:  Cancer in her father.  Deceased.     REVIEW OF SYSTEMS:            HEENT:  No headaches.  No diplopia.   CHEST/RESPIRATORY:  No shortness of breath.   CARDIAC:  No clear chest pain or palpitations.   GI:  No nausea or vomiting, abdominal pain, or diarrhea.   GU:  No voiding difficulties.    MUSCULOSKELETAL:  She describes severe generalized weakness, making it very difficult for her to transfer.  She states that she is much worse now than at her baseline even 3-4 weeks ago.    PHYSICAL EXAMINATION:   VITAL SIGNS:   O2 SATURATIONS:  95% on room air.   PULSE:  100 and regular.     RESPIRATIONS:  18 and unlabored.   GENERAL APPEARANCE:  Pleasant, alert, cognitively intact woman in no distress.    CHEST/RESPIRATORY:  She has significant  thoracic kyphosis.  However, air entry is fairly good.  There is no wheezing or crackles.      CARDIOVASCULAR:   CARDIAC:  Heart sounds are normal.  There are no murmurs.  No carotid bruits.  She appears to be euvolemic.   GASTROINTESTINAL:   ABDOMEN:  Slightly distended.  Bowel sounds are positive.  No masses.     LIVER/SPLEEN/KIDNEYS:   No liver, no spleen.   GENITOURINARY:    BLADDER:  No bladder distention is noted.   CIRCULATION:   EDEMA/VARICOSITIES:  Extremities:  Venous stasis.  Minimal edema.   SKIN:   INSPECTION:  Abscess on her left arm has been I&D'ed and sutured.  However, it has dehisced already.  There is still significant erythema around this area.   NEUROLOGICAL:  The patient sits in a chair, her head on her chest.  She is able to straighten her neck out, but it seems to slip down on her unknowingly.   CRANIAL NERVES:  Extraocular movements are normal.  Her upgaze is limited, especially on the left, although she does not fatigue.    MOTOR/TONE:  She has no pronator drift.   SENSATION/STRENGTH:  Strength in her lower extremities proximally is very weak, perhaps 3/5.  She has diffuse muscle wasting, but no fasciculations.     DEEP TENDON REFLEXES:  Her reflexes are absent everywhere.   PSYCHIATRIC:   MENTAL STATUS:  As mentioned above, I see no abnormalities here.    ASSESSMENT/PLAN:  New onset atrial fibrillation.  She is on Xarelto.  I am not exactly sure whether that was new or not as she does have a history of DVT/PE.  Her heart rate is currently 100 and regular.    Abscess in the left arm.  This was sutured.  However, it has dehisced and is probably going to leave an open wound here.  There is some erythema around this.  I will change her to a silver alginate-based dressing.    Profound muscular weakness.  One would wonder if all of this was steroid-induced myopathy.  Right now, it seems that she cannot even completely hold her head up.  She is  not on a statin.  She may have diabetic neuropathy adding to this.    Type 2 diabetes.  On oral agents.  Her hemoglobin A1c is 6.9.  Likely has a diabetic neuropathy.    Hypertension.  On a beta blocker.    Overactive bladder.  Followed by Dr. Jeffie Pollock of Urology.    Chronic pain in her low back and legs.    Moderate protein calorie malnutrition.    The Septra is being continued for 10 days.  I will try to follow the wound.  There is still erythema around this.    She will need aggressive physical therapy.  Right now, I do not think this woman could transfer independently.    By the patient's account, things have gone downhill quite a bit and fairly rapidly, although I would like that history confirmed with family members before proceeding to even a thought about evaluating this which might include EMGs, nerve conduction studies.    I will check a total CK, sedimentation rate, TSH.  She will need her electrolytes followed up from the hospital when her magnesium was 1.3, potassium 2.9 on admission.  The etiology here is not clear.     CPT CODE: 75300

## 2014-09-28 DIAGNOSIS — Z4789 Encounter for other orthopedic aftercare: Secondary | ICD-10-CM | POA: Diagnosis not present

## 2014-09-29 ENCOUNTER — Non-Acute Institutional Stay (SKILLED_NURSING_FACILITY): Payer: Medicare Other | Admitting: Internal Medicine

## 2014-09-29 DIAGNOSIS — E876 Hypokalemia: Secondary | ICD-10-CM | POA: Diagnosis not present

## 2014-09-29 DIAGNOSIS — G894 Chronic pain syndrome: Secondary | ICD-10-CM

## 2014-10-03 NOTE — Progress Notes (Addendum)
Patient ID: Elizabeth Drake, female   DOB: Nov 09, 1940, 74 y.o.   MRN: [id number]                PROGRESS NOTE  DATE:  09/29/2014             FACILITY[name]  LEVEL OF CARE:   SNF   Acute Visit                                 CHIEF COMPLAINT:  Follow up a multitude of medical issues from admission last week.    HISTORY OF PRESENT ILLNESS:  Elizabeth Drake is a frail, 74 year-old woman who lives with her husband in [place].    She normally is able to do pivot transfers from her bed to the wheelchair, her wheelchair to commode, etc.    She came to [place] after presenting to the hospital with 3-4 days of generalized weakness.  In the ER, she was found to be in atrial fibrillation which was new onset.    She also had an abscess in her left arm, the I&D of which showed MRSA.  Her white count was 21,000.    She was also found to have hypokalemia, hypomagnesemia.    When I saw her last week, she was profoundly weak, even having trouble holding her head up in the chair.  I wondered about some form of myopathy.  She is on chronic prednisone for sarcoidosis.    She is also a type 2 diabetic, on oral agents, probably with a diabetic neuropathy.    Her lab work was repeated.   Her CBC with diff is almost completely normal with a white count of 10.7, hemoglobin of 13.3, and platelets of 450.  Differential count is normal.    Her magnesium level is 2.3, which is corrected.  Her potassium is 4.8, also corrected from hospital value.   Her potassium got as low as 2.9.  Her magnesium got as low as 1.3.    Both of these seem to be adequately replaced.     Her TSH is 2.48.    A CK that I did as a screen for myopathy was 22.    Her hemoglobin A1c is 6.8.      Surprisingly, her calcium is 11.5.  I have reviewed her values in [facility] and the calcium value was essentially normal throughout this hospitalization, discharging on 09/16/2014 at 8.2.    Last albumin I see was 3 on 09/13/2014.      With regards to the elevated calcium, she complains of pain in her bilateral legs which apparently is longstanding.  She states the pain is not controlled.  She does not have back pain.  There have been no recent fractures.  She has no other complaints of pain.    The patient did traumatize both of her legs in transferring in the facility this morning.  She has bilateral skin tears to which the facility has applied a foam-based dressing.    CURRENT MEDICATIONS:  Medication list is reviewed.    Prednisone 5 mg.    Metformin 500 daily.    Amaryl 2 mg daily.    Os-Cal 500 daily.    I see she takes her Norco fairly routinely.      PHYSICAL EXAMINATION:   VITAL SIGNS:   PULSE:     76.    GENERAL APPEARANCE:  The patient  actually looks quite a bit better than last week.  She is holding her head up.   CHEST/RESPIRATORY:  Clear air entry bilaterally.    CARDIOVASCULAR:   CARDIAC:  Heart sounds feel regular.   GASTROINTESTINAL:   ABDOMEN:  No masses.     LIVER/SPLEEN/KIDNEYS:  No liver, no spleen.    ASSESSMENT/PLAN:                         Hypomagnesemia, hypokalemia.  Successfully corrected in the hospital.      Hypercalcemia.  I see no good reason for this.  Although I am aware of the association of sarcoidosis with hypercalcemia via a vitamin D mediated mechanism, it is hard to believe that this could come about so quickly.  I am left with the ominous feeling that this may be some form of measurement/lab error; and before I give this anymore thought, I am going to repeat this.  I will include a serum albumin.    Muscular weakness.  She appears to be doing better here, much better than last week.  Therefore, I am a little more confident about things.  She is apparently doing fairly well in transfers and physical therapy.    Lower extremity pain.  I am really not exactly sure what this is.  This may be all diabetic neuropathy.  Her peripheral pulses are palpable.  She takes Norco  fairly frequently.  One would wonder about using Lyrica or some other such agent here.     Chronic pain on frequent Norco. She has narcotic allergies and intolerances; As the patient states, she will not be here that long therefore I am not going to try to adjust her narcotics   CPT CODE: 94801

## 2014-10-13 ENCOUNTER — Non-Acute Institutional Stay (SKILLED_NURSING_FACILITY): Payer: Medicare Other | Admitting: Internal Medicine

## 2014-10-13 DIAGNOSIS — E1142 Type 2 diabetes mellitus with diabetic polyneuropathy: Secondary | ICD-10-CM | POA: Diagnosis not present

## 2014-10-13 DIAGNOSIS — G894 Chronic pain syndrome: Secondary | ICD-10-CM

## 2014-10-18 NOTE — Progress Notes (Addendum)
Patient ID: [name], female   DOB: 1941-05-05, 74 y.o.   MRN: [id number]                PROGRESS NOTE  DATE:  10/13/2014               FACILITY[name]                LEVEL OF CARE:   SNF   Acute Visit                                       CHIEF COMPLAINT:  Review of multiple issues.    HISTORY OF PRESENT ILLNESS:  [name] is concerned about a small "gross" on her right eye.  She is also concerned about her pain issues.   I am seeing her today because of this.    This is a 74 year-old woman who lives in [place].    In the hospital, she was found to have atrial fibrillation and also an E.coli UTI, and finally a left arm abscess that grew MRSA.    She is on chronic steroids related to a history of sarcoidosis.    She has also type 2 diabetes with a hemoglobin A1c of 6.9.  Her metformin and Amaryl were put on hold.    At home, she is essentially wheelchair-bound but can pivot transfer.  She has not walked in quite a few years, according to her.    Her major complaint currently is pain and that is in her anterior lower legs bilaterally.  She takes Norco very frequently.   She was seen in consult by Dr. Chancy Milroy of Pain Management.  I think he suggested a long-acting morphine with Norco p.r.n.  She refused this, stating that she was concerned about "addiction" but she actually wants the [place] fairly frequently.    PHYSICAL EXAMINATION:   GENERAL APPEARANCE:  The patient is not in any distress.   HEENT:   EYES:  She has a small nodule on the inner canthus of her upper eyelid on the right.  I would watch this for a period of time.  This is not malignancy although, because of where it is, it may need to be removed at some point.   CIRCULATION:   EDEMA/VARICOSITIES:  Extremities:  She has venous stasis.   VASCULAR:   ARTERIAL:  I do not feel peripheral pulses in her feet, but she has brisk popliteal pulses.   MUSCULOSKELETAL:   EXTREMITIES:   BILATERAL LOWER EXTREMITIES:   I am not completely certain why she has so much pain in the anterior legs.    LABORATORY DATA:  Finally, reviewing her lab work notes her high calcium level which has been repeated twice, I believe at 11.5 and then repeated at 10.9.  Her potassium and magnesium levels that were low in the hospital have been adequately replaced.    Her hemoglobin A1c that I did here was 6.8.     ASSESSMENT/PLAN:                       Lower extremity pain.  Her reflexes are absent.  I wonder if this is some form of neuropathic pain likely diabetic neuropathy.  She also could have vascular insufficiency.  I tried to talk to her about the issue with regards to a long-acting pain medication.  She  was hearing none of it.  I will increase her Norco at her request.    Hypercalcemia.  I still see no good reason for this, although this could be related to her sarcoidosis.  At this point, I am not really planning to work this up further while she is here.    Profound muscular weakness with wasting of her quads and hams.  There are no fasciculations here.  I am not exactly sure of the issue. I was very concerned when I first admitted her about this issue (not able to even hold her head up), however this seems to have improved with therapy   CPT CODE: 80223

## 2014-10-20 ENCOUNTER — Non-Acute Institutional Stay (SKILLED_NURSING_FACILITY): Payer: Medicare Other | Admitting: Internal Medicine

## 2014-10-20 DIAGNOSIS — E1142 Type 2 diabetes mellitus with diabetic polyneuropathy: Secondary | ICD-10-CM

## 2014-10-20 DIAGNOSIS — M6281 Muscle weakness (generalized): Secondary | ICD-10-CM | POA: Diagnosis not present

## 2014-10-25 NOTE — Progress Notes (Signed)
Patient ID: Elizabeth Drake, female   DOB: 03-Aug-1941, 74 y.o.   MRN: [id number]                PROGRESS NOTE  DATE:  10/20/2014                  FACILITY: [name]                        LEVEL OF CARE:   SNF   Acute Visit                         CHIEF COMPLAINT:  Generalized weakness.      HISTORY OF PRESENT ILLNESS:  This is not a new complaint for this patient.  However, she requested to see me today because of this issue.    The history that she is giving is that she has been regressing in therapy over the last week.  Apparently, she went home the weekend before last, was able to do her usual transferring from bed to wheelchair, the wheelchair to chair or commode.  However, she went on this week and was not able to do this.  Apparently, her physical therapist has noted the decline although I have not had any information from them.     She came in here after she had an abscess in her left arm.  She also had E.coli and Enterococcus in her urine.  She received an I&D that showed MRSA.  She was treated for her UTI.  All of this seemed to get better.    When I first saw her, I was concerned about her weakness.  I also noted marked muscle wasting, absent reflexes, no fasciculations.  I was most concerned about her difficulty seemingly to hold her head up.  However, the next time I saw her, all of this looked somewhat better.  I thought she was improving in therapy.  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  However, I was generally reassured about her improvement which I thought was largely therapy.    As noted above, she now states she is deteriorating.  This has apparently been independently verified.    CURRENT MEDICATIONS:  Medication list is reviewed.                         Amaryl 2 mg daily.    Metformin 500 b.i.d.        Norco 1-2 tablets q.4 hours p.r.n.         Metoprolol 25  twice a day.    Nasonex.    Prednisone 5 q.d.       Xarelto 20 q.d. (history of DVT and PE).    PHYSICAL EXAMINATION:   GENERAL APPEARANCE:  The patient does not look that much different from what I am used to seeing.  Once again, she seems to be constantly holding her head up.   CHEST/RESPIRATORY:  She has a significant kyphoscoliosis.  Air entry is fairly equal bilaterally.  There are no crackles or wheezes.   CARDIOVASCULAR:   CARDIAC:  She appears to be euvolemic.   GASTROINTESTINAL:   ABDOMEN:  Slightly distended.  No masses.   NEUROLOGICAL:    CRANIAL NERVES:  She does not really fatigue on upgaze, and she does not complain of diplopia.  The rest of  her cranial nerves seem intact.   SENSATION/STRENGTH:  In the upper extremities, she does not have pronator drift.  Her strength is reduced; however, equal at roughly 4/5.  In the lower extremities, she has barely 3/5 hip flexor and abductor.  She has 3/5 weakness of her quads, right greater than left.   DEEP TENDON REFLEXES:  She is diffusely hyporeflexic.    MUSCULOSKELETAL:   EXTREMITIES:  Marked muscle wasting, especially in her thighs, including her quads and hamstrings.   There are no fasciculations.       ASSESSMENT/PLAN:                                  Chronic weakness.  I  note that she was admitted to rehab in November 2014, largely for these similar complaints.  Given these facts, it is hard for me to imagine that working her up for an alternative explanation than the diabetic neuropathy, myopathy secondary to steroids, or perhaps some effect from her sarcoidosis would be useful.  I would wonder about EMGs and nerve conduction studies.  I cannot see a neurologic work-up on her in the past.  MRI of the T- and L-spine was done in 2009, showing spinal stenosis in the lumbar area.  My suspicion is that a lot of this is very longstanding.  It would be difficult for me to see an alternative diagnosis that would make a tangible  difference to the patient.  I will watch her, however, and talk to her therapist.  If this continues, perhaps a neurologic review.

## 2014-10-26 ENCOUNTER — Other Ambulatory Visit: Payer: Self-pay | Admitting: *Deleted

## 2014-10-26 DIAGNOSIS — Z4789 Encounter for other orthopedic aftercare: Secondary | ICD-10-CM | POA: Diagnosis not present

## 2014-10-26 MED ORDER — HYDROCODONE-ACETAMINOPHEN 5-325 MG PO TABS: 1.0000 | ORAL_TABLET | Freq: Four times a day (QID) | ORAL | Status: DC | PRN

## 2014-10-26 NOTE — Telephone Encounter (Signed)
[facility] 

## 2014-10-27 ENCOUNTER — Other Ambulatory Visit: Payer: Self-pay | Admitting: *Deleted

## 2014-10-27 ENCOUNTER — Non-Acute Institutional Stay (SKILLED_NURSING_FACILITY): Payer: Medicare Other | Admitting: Internal Medicine

## 2014-10-27 DIAGNOSIS — M6281 Muscle weakness (generalized): Secondary | ICD-10-CM | POA: Diagnosis not present

## 2014-10-27 DIAGNOSIS — D869 Sarcoidosis, unspecified: Secondary | ICD-10-CM | POA: Diagnosis not present

## 2014-10-27 DIAGNOSIS — E1142 Type 2 diabetes mellitus with diabetic polyneuropathy: Secondary | ICD-10-CM

## 2014-10-27 MED ORDER — HYDROCODONE-ACETAMINOPHEN 7.5-325 MG PO TABS: 1.0000 | ORAL_TABLET | Freq: Four times a day (QID) | ORAL | Status: DC | PRN

## 2014-11-02 DIAGNOSIS — L84 Corns and callosities: Secondary | ICD-10-CM | POA: Diagnosis not present

## 2014-11-02 DIAGNOSIS — B351 Tinea unguium: Secondary | ICD-10-CM | POA: Diagnosis not present

## 2014-11-02 DIAGNOSIS — E1151 Type 2 diabetes mellitus with diabetic peripheral angiopathy without gangrene: Secondary | ICD-10-CM | POA: Diagnosis not present

## 2014-11-04 ENCOUNTER — Other Ambulatory Visit: Payer: Self-pay | Admitting: Nurse Practitioner

## 2014-11-04 ENCOUNTER — Other Ambulatory Visit: Payer: Self-pay | Admitting: Family Medicine

## 2014-11-07 DIAGNOSIS — E43 Unspecified severe protein-calorie malnutrition: Secondary | ICD-10-CM | POA: Diagnosis not present

## 2014-11-07 DIAGNOSIS — Z86711 Personal history of pulmonary embolism: Secondary | ICD-10-CM | POA: Diagnosis not present

## 2014-11-07 DIAGNOSIS — E119 Type 2 diabetes mellitus without complications: Secondary | ICD-10-CM | POA: Diagnosis not present

## 2014-11-07 DIAGNOSIS — D869 Sarcoidosis, unspecified: Secondary | ICD-10-CM | POA: Diagnosis not present

## 2014-11-07 DIAGNOSIS — G894 Chronic pain syndrome: Secondary | ICD-10-CM | POA: Diagnosis not present

## 2014-11-07 DIAGNOSIS — S80922D Unspecified superficial injury of left lower leg, subsequent encounter: Secondary | ICD-10-CM | POA: Diagnosis not present

## 2014-11-07 DIAGNOSIS — I480 Paroxysmal atrial fibrillation: Secondary | ICD-10-CM | POA: Diagnosis not present

## 2014-11-07 DIAGNOSIS — S80921D Unspecified superficial injury of right lower leg, subsequent encounter: Secondary | ICD-10-CM | POA: Diagnosis not present

## 2014-11-07 DIAGNOSIS — I10 Essential (primary) hypertension: Secondary | ICD-10-CM | POA: Diagnosis not present

## 2014-11-08 DIAGNOSIS — E43 Unspecified severe protein-calorie malnutrition: Secondary | ICD-10-CM | POA: Diagnosis not present

## 2014-11-08 DIAGNOSIS — I480 Paroxysmal atrial fibrillation: Secondary | ICD-10-CM | POA: Diagnosis not present

## 2014-11-08 DIAGNOSIS — E119 Type 2 diabetes mellitus without complications: Secondary | ICD-10-CM | POA: Diagnosis not present

## 2014-11-08 DIAGNOSIS — S80921D Unspecified superficial injury of right lower leg, subsequent encounter: Secondary | ICD-10-CM | POA: Diagnosis not present

## 2014-11-08 DIAGNOSIS — S80922D Unspecified superficial injury of left lower leg, subsequent encounter: Secondary | ICD-10-CM | POA: Diagnosis not present

## 2014-11-08 DIAGNOSIS — D869 Sarcoidosis, unspecified: Secondary | ICD-10-CM | POA: Diagnosis not present

## 2014-11-09 DIAGNOSIS — I480 Paroxysmal atrial fibrillation: Secondary | ICD-10-CM | POA: Diagnosis not present

## 2014-11-09 DIAGNOSIS — S80921D Unspecified superficial injury of right lower leg, subsequent encounter: Secondary | ICD-10-CM | POA: Diagnosis not present

## 2014-11-09 DIAGNOSIS — E119 Type 2 diabetes mellitus without complications: Secondary | ICD-10-CM | POA: Diagnosis not present

## 2014-11-09 DIAGNOSIS — E43 Unspecified severe protein-calorie malnutrition: Secondary | ICD-10-CM | POA: Diagnosis not present

## 2014-11-09 DIAGNOSIS — S80922D Unspecified superficial injury of left lower leg, subsequent encounter: Secondary | ICD-10-CM | POA: Diagnosis not present

## 2014-11-09 DIAGNOSIS — D869 Sarcoidosis, unspecified: Secondary | ICD-10-CM | POA: Diagnosis not present

## 2014-11-09 NOTE — Progress Notes (Signed)
Patient ID: Elizabeth Drake, female   DOB: 09/16/1940, 74 y.o.   MRN: [id number]                PROGRESS NOTE  DATE:  10/27/2014           FACILITY: [name]          LEVEL OF CARE:   SNF   Acute Visit/Discharge Visit        CHIEF COMPLAINT:  Pre-discharge review.      HISTORY OF PRESENT ILLNESS:  Elizabeth Drake is a lady whom I follow fairly frequently at her insistence.    Initially, we admitted her here after a stay at the hospital with 3-4 weeks of generalized weakness.  She was found to be in atrial fibrillation which was new onset.  She also had an abscess in her left arm.  Her white count was 21,000.  I&D of this abscess grew MRSA.  She was found to have hypokalemia and hypomagnesemia.    The patient is a diabetic.   Her blood sugars have been reasonably controlled on Amaryl and metformin.  She is felt to have diabetic neuropathy.  Her hemoglobin A1c was 6.8.    She also has a slightly elevated calcium, most recently repeated at 11.9.  I have not worked this up.  I am aware that sarcoidosis can cause this, I think by a number of mechanisms.  I believe there is intrinsic activation of vitamin D.  In any case, I have not worked this up further and she is asymptomatic.    The big issue here is profound weakness, which is really longstanding.   When she first came here, it looked as though she could barely hold her head up and her arms were weak, although these seem to have made satisfactory recovery.  She can pivot transfer to her wheelchair with assistance.  She cannot do this independently.    PHYSICAL EXAMINATION:   NEUROLOGICAL:   I have gone over this one more time.   CRANIAL NERVES:  There are no obvious cranial nerve problems.   SENSATION/STRENGTH:  She has antigravity strength in her upper extremities.  Her major issue seems to be in right hip flexion, which is not antigravity.   She does have 3/5 knee extension.  Her hip abductors are 3+/5 bilaterally.  She has right ankle  dorsiflexion, which is weak.  This is normal on the left side.   DEEP TENDON REFLEXES:   She is diffusely hyperreflexic.  She has no muscle mass in either quadricep, although this is worse on the right.  There are no fasciculations.    ASSESSMENT/PLAN:                          Generalized weakness.   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  The patient states that her weakness in her right leg has been there for 6-7 years.  She was cared for, for many years, by Dr. Kristen Loader and was told that her muscular weakness was due to sarcoidosis.  As I understand things, this would be a fairly rare manifestation.  I have told her that if she wanted this worked up further, she would need to see Neurology for consideration of EMGs and nerve conduction studies.  She tells me that she has seen a neurologist in the past, although I cannot see that in [place].  I talked to her physical therapist today.  She is doing reasonably well.    The hypomagnesemia and hypokalemia that she had in the hospital have been corrected.  As noted, she did have mildly elevated calcium levels here.  Her TSH, CK, and magnesium level were all within the normal range.    I think this patient will need PT/OT at home.  I believe she has all of her DME.  She is going home with her husband to [facility].     CPT CODE: 16435 (greater than 30 minutes spent)

## 2014-11-12 DIAGNOSIS — E119 Type 2 diabetes mellitus without complications: Secondary | ICD-10-CM | POA: Diagnosis not present

## 2014-11-12 DIAGNOSIS — S80921D Unspecified superficial injury of right lower leg, subsequent encounter: Secondary | ICD-10-CM | POA: Diagnosis not present

## 2014-11-12 DIAGNOSIS — I480 Paroxysmal atrial fibrillation: Secondary | ICD-10-CM | POA: Diagnosis not present

## 2014-11-12 DIAGNOSIS — S80922D Unspecified superficial injury of left lower leg, subsequent encounter: Secondary | ICD-10-CM | POA: Diagnosis not present

## 2014-11-12 DIAGNOSIS — D869 Sarcoidosis, unspecified: Secondary | ICD-10-CM | POA: Diagnosis not present

## 2014-11-12 DIAGNOSIS — E43 Unspecified severe protein-calorie malnutrition: Secondary | ICD-10-CM | POA: Diagnosis not present

## 2014-11-15 DIAGNOSIS — S80922D Unspecified superficial injury of left lower leg, subsequent encounter: Secondary | ICD-10-CM | POA: Diagnosis not present

## 2014-11-15 DIAGNOSIS — S80921D Unspecified superficial injury of right lower leg, subsequent encounter: Secondary | ICD-10-CM | POA: Diagnosis not present

## 2014-11-15 DIAGNOSIS — I480 Paroxysmal atrial fibrillation: Secondary | ICD-10-CM | POA: Diagnosis not present

## 2014-11-15 DIAGNOSIS — D869 Sarcoidosis, unspecified: Secondary | ICD-10-CM | POA: Diagnosis not present

## 2014-11-15 DIAGNOSIS — E119 Type 2 diabetes mellitus without complications: Secondary | ICD-10-CM | POA: Diagnosis not present

## 2014-11-15 DIAGNOSIS — E43 Unspecified severe protein-calorie malnutrition: Secondary | ICD-10-CM | POA: Diagnosis not present

## 2014-11-17 DIAGNOSIS — I480 Paroxysmal atrial fibrillation: Secondary | ICD-10-CM | POA: Diagnosis not present

## 2014-11-17 DIAGNOSIS — D869 Sarcoidosis, unspecified: Secondary | ICD-10-CM | POA: Diagnosis not present

## 2014-11-17 DIAGNOSIS — S80921D Unspecified superficial injury of right lower leg, subsequent encounter: Secondary | ICD-10-CM | POA: Diagnosis not present

## 2014-11-17 DIAGNOSIS — S80922D Unspecified superficial injury of left lower leg, subsequent encounter: Secondary | ICD-10-CM | POA: Diagnosis not present

## 2014-11-17 DIAGNOSIS — E119 Type 2 diabetes mellitus without complications: Secondary | ICD-10-CM | POA: Diagnosis not present

## 2014-11-17 DIAGNOSIS — E43 Unspecified severe protein-calorie malnutrition: Secondary | ICD-10-CM | POA: Diagnosis not present

## 2014-11-19 DIAGNOSIS — S80922D Unspecified superficial injury of left lower leg, subsequent encounter: Secondary | ICD-10-CM | POA: Diagnosis not present

## 2014-11-19 DIAGNOSIS — E119 Type 2 diabetes mellitus without complications: Secondary | ICD-10-CM | POA: Diagnosis not present

## 2014-11-19 DIAGNOSIS — S80921D Unspecified superficial injury of right lower leg, subsequent encounter: Secondary | ICD-10-CM | POA: Diagnosis not present

## 2014-11-19 DIAGNOSIS — D869 Sarcoidosis, unspecified: Secondary | ICD-10-CM | POA: Diagnosis not present

## 2014-11-19 DIAGNOSIS — E43 Unspecified severe protein-calorie malnutrition: Secondary | ICD-10-CM | POA: Diagnosis not present

## 2014-11-19 DIAGNOSIS — I480 Paroxysmal atrial fibrillation: Secondary | ICD-10-CM | POA: Diagnosis not present

## 2014-11-22 DIAGNOSIS — S80922D Unspecified superficial injury of left lower leg, subsequent encounter: Secondary | ICD-10-CM | POA: Diagnosis not present

## 2014-11-22 DIAGNOSIS — D869 Sarcoidosis, unspecified: Secondary | ICD-10-CM | POA: Diagnosis not present

## 2014-11-22 DIAGNOSIS — I480 Paroxysmal atrial fibrillation: Secondary | ICD-10-CM | POA: Diagnosis not present

## 2014-11-22 DIAGNOSIS — E119 Type 2 diabetes mellitus without complications: Secondary | ICD-10-CM | POA: Diagnosis not present

## 2014-11-22 DIAGNOSIS — S80921D Unspecified superficial injury of right lower leg, subsequent encounter: Secondary | ICD-10-CM | POA: Diagnosis not present

## 2014-11-22 DIAGNOSIS — E43 Unspecified severe protein-calorie malnutrition: Secondary | ICD-10-CM | POA: Diagnosis not present

## 2014-11-24 DIAGNOSIS — E43 Unspecified severe protein-calorie malnutrition: Secondary | ICD-10-CM | POA: Diagnosis not present

## 2014-11-24 DIAGNOSIS — E119 Type 2 diabetes mellitus without complications: Secondary | ICD-10-CM | POA: Diagnosis not present

## 2014-11-24 DIAGNOSIS — S80921D Unspecified superficial injury of right lower leg, subsequent encounter: Secondary | ICD-10-CM | POA: Diagnosis not present

## 2014-11-24 DIAGNOSIS — R339 Retention of urine, unspecified: Secondary | ICD-10-CM | POA: Diagnosis not present

## 2014-11-24 DIAGNOSIS — N3020 Other chronic cystitis without hematuria: Secondary | ICD-10-CM | POA: Diagnosis not present

## 2014-11-24 DIAGNOSIS — I480 Paroxysmal atrial fibrillation: Secondary | ICD-10-CM | POA: Diagnosis not present

## 2014-11-24 DIAGNOSIS — S80922D Unspecified superficial injury of left lower leg, subsequent encounter: Secondary | ICD-10-CM | POA: Diagnosis not present

## 2014-11-24 DIAGNOSIS — N3941 Urge incontinence: Secondary | ICD-10-CM | POA: Diagnosis not present

## 2014-11-24 DIAGNOSIS — D869 Sarcoidosis, unspecified: Secondary | ICD-10-CM | POA: Diagnosis not present

## 2014-11-26 DIAGNOSIS — E43 Unspecified severe protein-calorie malnutrition: Secondary | ICD-10-CM | POA: Diagnosis not present

## 2014-11-26 DIAGNOSIS — E119 Type 2 diabetes mellitus without complications: Secondary | ICD-10-CM | POA: Diagnosis not present

## 2014-11-26 DIAGNOSIS — S80921D Unspecified superficial injury of right lower leg, subsequent encounter: Secondary | ICD-10-CM | POA: Diagnosis not present

## 2014-11-26 DIAGNOSIS — S80922D Unspecified superficial injury of left lower leg, subsequent encounter: Secondary | ICD-10-CM | POA: Diagnosis not present

## 2014-11-26 DIAGNOSIS — I480 Paroxysmal atrial fibrillation: Secondary | ICD-10-CM | POA: Diagnosis not present

## 2014-11-26 DIAGNOSIS — D869 Sarcoidosis, unspecified: Secondary | ICD-10-CM | POA: Diagnosis not present

## 2014-11-27 ENCOUNTER — Ambulatory Visit (INDEPENDENT_AMBULATORY_CARE_PROVIDER_SITE_OTHER): Payer: Medicare Other | Admitting: Nurse Practitioner

## 2014-11-27 ENCOUNTER — Encounter: Payer: Self-pay | Admitting: Nurse Practitioner

## 2014-11-27 VITALS — BP 134/73 | HR 63 | Temp 97.2°F

## 2014-11-27 DIAGNOSIS — N3001 Acute cystitis with hematuria: Secondary | ICD-10-CM

## 2014-11-27 DIAGNOSIS — R339 Retention of urine, unspecified: Secondary | ICD-10-CM

## 2014-11-27 LAB — POCT UA - MICROSCOPIC ONLY
CASTS, UR, LPF, POC: NEGATIVE
CRYSTALS, UR, HPF, POC: NEGATIVE

## 2014-11-27 LAB — POCT URINALYSIS DIPSTICK
BILIRUBIN UA: NEGATIVE
Glucose, UA: NEGATIVE
PROTEIN UA: NEGATIVE
Spec Grav, UA: <=1.005
UROBILINOGEN UA: NEGATIVE
pH, UA: 7.5

## 2014-11-27 MED ORDER — CIPROFLOXACIN HCL 500 MG PO TABS: 500.0000 mg | ORAL_TABLET | Freq: Two times a day (BID) | ORAL | Status: DC

## 2014-11-27 NOTE — Patient Instructions (Signed)
Acute Urinary Retention °Acute urinary retention is the temporary inability to urinate. This is an uncommon problem in women. It can be caused by: °· Infection. °· A side effect of a medicine. °· A problem in a nearby organ that presses or squeezes on the bladder or the urethra (the tube that drains the bladder). °· Psychological problems. °·  Surgery on your bladder, urethra, or pelvic organs that causes obstruction to the outflow of urine from your bladder. °HOME CARE INSTRUCTIONS  °If you are sent home with a Foley catheter and a drainage system, you will need to discuss the best course of action with your health care provider. While the catheter is in, maintain a good intake of fluids. Keep the drainage bag emptied and lower than your catheter. This is so that contaminated urine will not flow back into your bladder, which could lead to a urinary tract infection. °There are two main types of drainage bags. One is a large bag that usually is used at night. It has a good capacity that will allow you to sleep through the night without having to empty it. The second type is called a leg bag. It has a smaller capacity so it needs to be emptied more frequently. However, the main advantage is that it can be attached by a leg strap and goes underneath your clothing, allowing you the freedom to move about or leave your home. °Only take over-the-counter or prescription medicines for pain, discomfort, or fever as directed by your health care provider.  °SEEK MEDICAL CARE IF: °· You develop a low-grade fever. °· You experience spasms or leakage of urine with the spasms. °SEEK IMMEDIATE MEDICAL CARE IF:  °· You develop chills or fever. °· Your catheter stops draining urine. °· Your catheter falls out. °· You start to develop increased bleeding that does not respond to rest and increased fluid intake. °MAKE SURE YOU: °· Understand these instructions. °· Will watch your condition. °· Will get help right away if you are not  doing well or get worse. °Document Released: 07/22/2006 Document Revised: 05/13/2013 Document Reviewed: 01/01/2013 °ExitCare® Patient Information ©2015 [employer]. This information is not intended to replace advice given to you by your health care provider. Make sure you discuss any questions you have with your health care provider. ° °

## 2014-11-27 NOTE — Progress Notes (Signed)
   Subjective:    Patient ID: Elizabeth Drake, female    DOB: 12-13-1940, 74 y.o.   MRN: [id number]  HPI Patient went to [facility] for check up yesterday- she has urinary frequency and they gave her toviaz samples- she took one last night and now she cannot void- The last time she went to the bathroom was at 11pm last night and it was just a small amount. She has been unable to void since then aand feels like her bladder is going to pop.    Review of Systems  Constitutional: Negative.   HENT: Negative.   Respiratory: Negative.   Cardiovascular: Negative.   Genitourinary: Negative.   Neurological: Negative.   Psychiatric/Behavioral: Negative.   All other systems reviewed and are negative.      Objective:   Physical Exam  Constitutional: She is oriented to person, place, and time. She appears well-developed and well-nourished.  Cardiovascular: Normal rate, regular rhythm and normal heart sounds.   Pulmonary/Chest: Effort normal and breath sounds normal.  Abdominal: Soft. Bowel sounds are normal. There is tenderness (suprapubic painon palpation).  Neurological: She is alert and oriented to person, place, and time.  Skin: Skin is warm.  Psychiatric: She has a normal mood and affect. Her behavior is normal. Judgment and thought content normal.    BP 134/73 mmHg  Pulse 63  Temp(Src) 97.2 F (36.2 C) (Oral)  Ht   Wt   Procedure- in and out cath- 330cc of foul smelling cloudy Elizabeth Mail, FNP        Assessment & Plan:   1. Urinary retention   2. Acute cystitis with hematuria    Meds ordered this encounter  Medications  . ciprofloxacin (CIPRO) 500 MG tablet    Sig: Take 1 tablet (500 mg total) by mouth 2 (two) times daily.    Dispense:  14 tablet    Refill:  0    Order Specific Question:  Supervising Provider    Answer:  Chipper Herb [[id number]]   Force fluids RTOprn DO not take anymore toviaz until you speak with urologist  Mary-Margaret Hassell Done,  FNP

## 2014-11-29 LAB — URINE CULTURE

## 2014-11-30 DIAGNOSIS — I480 Paroxysmal atrial fibrillation: Secondary | ICD-10-CM | POA: Diagnosis not present

## 2014-11-30 DIAGNOSIS — D869 Sarcoidosis, unspecified: Secondary | ICD-10-CM | POA: Diagnosis not present

## 2014-11-30 DIAGNOSIS — S80921D Unspecified superficial injury of right lower leg, subsequent encounter: Secondary | ICD-10-CM | POA: Diagnosis not present

## 2014-11-30 DIAGNOSIS — E43 Unspecified severe protein-calorie malnutrition: Secondary | ICD-10-CM | POA: Diagnosis not present

## 2014-11-30 DIAGNOSIS — S80922D Unspecified superficial injury of left lower leg, subsequent encounter: Secondary | ICD-10-CM | POA: Diagnosis not present

## 2014-11-30 DIAGNOSIS — E119 Type 2 diabetes mellitus without complications: Secondary | ICD-10-CM | POA: Diagnosis not present

## 2014-12-01 DIAGNOSIS — E43 Unspecified severe protein-calorie malnutrition: Secondary | ICD-10-CM | POA: Diagnosis not present

## 2014-12-01 DIAGNOSIS — I480 Paroxysmal atrial fibrillation: Secondary | ICD-10-CM | POA: Diagnosis not present

## 2014-12-01 DIAGNOSIS — D869 Sarcoidosis, unspecified: Secondary | ICD-10-CM | POA: Diagnosis not present

## 2014-12-01 DIAGNOSIS — S80921D Unspecified superficial injury of right lower leg, subsequent encounter: Secondary | ICD-10-CM | POA: Diagnosis not present

## 2014-12-01 DIAGNOSIS — E119 Type 2 diabetes mellitus without complications: Secondary | ICD-10-CM | POA: Diagnosis not present

## 2014-12-01 DIAGNOSIS — S80922D Unspecified superficial injury of left lower leg, subsequent encounter: Secondary | ICD-10-CM | POA: Diagnosis not present

## 2014-12-03 DIAGNOSIS — S80922D Unspecified superficial injury of left lower leg, subsequent encounter: Secondary | ICD-10-CM | POA: Diagnosis not present

## 2014-12-03 DIAGNOSIS — E119 Type 2 diabetes mellitus without complications: Secondary | ICD-10-CM | POA: Diagnosis not present

## 2014-12-03 DIAGNOSIS — D869 Sarcoidosis, unspecified: Secondary | ICD-10-CM | POA: Diagnosis not present

## 2014-12-03 DIAGNOSIS — E43 Unspecified severe protein-calorie malnutrition: Secondary | ICD-10-CM | POA: Diagnosis not present

## 2014-12-03 DIAGNOSIS — S80921D Unspecified superficial injury of right lower leg, subsequent encounter: Secondary | ICD-10-CM | POA: Diagnosis not present

## 2014-12-03 DIAGNOSIS — I480 Paroxysmal atrial fibrillation: Secondary | ICD-10-CM | POA: Diagnosis not present

## 2014-12-07 DIAGNOSIS — E43 Unspecified severe protein-calorie malnutrition: Secondary | ICD-10-CM | POA: Diagnosis not present

## 2014-12-07 DIAGNOSIS — E119 Type 2 diabetes mellitus without complications: Secondary | ICD-10-CM | POA: Diagnosis not present

## 2014-12-07 DIAGNOSIS — D869 Sarcoidosis, unspecified: Secondary | ICD-10-CM | POA: Diagnosis not present

## 2014-12-07 DIAGNOSIS — I480 Paroxysmal atrial fibrillation: Secondary | ICD-10-CM | POA: Diagnosis not present

## 2014-12-07 DIAGNOSIS — S80922D Unspecified superficial injury of left lower leg, subsequent encounter: Secondary | ICD-10-CM | POA: Diagnosis not present

## 2014-12-07 DIAGNOSIS — S80921D Unspecified superficial injury of right lower leg, subsequent encounter: Secondary | ICD-10-CM | POA: Diagnosis not present

## 2014-12-09 DIAGNOSIS — S80921D Unspecified superficial injury of right lower leg, subsequent encounter: Secondary | ICD-10-CM | POA: Diagnosis not present

## 2014-12-09 DIAGNOSIS — D869 Sarcoidosis, unspecified: Secondary | ICD-10-CM | POA: Diagnosis not present

## 2014-12-09 DIAGNOSIS — E119 Type 2 diabetes mellitus without complications: Secondary | ICD-10-CM | POA: Diagnosis not present

## 2014-12-09 DIAGNOSIS — E43 Unspecified severe protein-calorie malnutrition: Secondary | ICD-10-CM | POA: Diagnosis not present

## 2014-12-09 DIAGNOSIS — S80922D Unspecified superficial injury of left lower leg, subsequent encounter: Secondary | ICD-10-CM | POA: Diagnosis not present

## 2014-12-09 DIAGNOSIS — I480 Paroxysmal atrial fibrillation: Secondary | ICD-10-CM | POA: Diagnosis not present

## 2014-12-13 DIAGNOSIS — D869 Sarcoidosis, unspecified: Secondary | ICD-10-CM | POA: Diagnosis not present

## 2014-12-13 DIAGNOSIS — E43 Unspecified severe protein-calorie malnutrition: Secondary | ICD-10-CM | POA: Diagnosis not present

## 2014-12-13 DIAGNOSIS — E119 Type 2 diabetes mellitus without complications: Secondary | ICD-10-CM | POA: Diagnosis not present

## 2014-12-13 DIAGNOSIS — S80922D Unspecified superficial injury of left lower leg, subsequent encounter: Secondary | ICD-10-CM | POA: Diagnosis not present

## 2014-12-13 DIAGNOSIS — I480 Paroxysmal atrial fibrillation: Secondary | ICD-10-CM | POA: Diagnosis not present

## 2014-12-13 DIAGNOSIS — S80921D Unspecified superficial injury of right lower leg, subsequent encounter: Secondary | ICD-10-CM | POA: Diagnosis not present

## 2014-12-16 DIAGNOSIS — E43 Unspecified severe protein-calorie malnutrition: Secondary | ICD-10-CM | POA: Diagnosis not present

## 2014-12-16 DIAGNOSIS — S80921D Unspecified superficial injury of right lower leg, subsequent encounter: Secondary | ICD-10-CM | POA: Diagnosis not present

## 2014-12-16 DIAGNOSIS — I480 Paroxysmal atrial fibrillation: Secondary | ICD-10-CM | POA: Diagnosis not present

## 2014-12-16 DIAGNOSIS — S80922D Unspecified superficial injury of left lower leg, subsequent encounter: Secondary | ICD-10-CM | POA: Diagnosis not present

## 2014-12-16 DIAGNOSIS — E119 Type 2 diabetes mellitus without complications: Secondary | ICD-10-CM | POA: Diagnosis not present

## 2014-12-16 DIAGNOSIS — D869 Sarcoidosis, unspecified: Secondary | ICD-10-CM | POA: Diagnosis not present

## 2014-12-17 ENCOUNTER — Encounter: Payer: Self-pay | Admitting: Nurse Practitioner

## 2014-12-17 ENCOUNTER — Ambulatory Visit (INDEPENDENT_AMBULATORY_CARE_PROVIDER_SITE_OTHER): Payer: Medicare Other | Admitting: Nurse Practitioner

## 2014-12-17 VITALS — BP 131/62 | HR 63 | Temp 97.6°F

## 2014-12-17 DIAGNOSIS — M810 Age-related osteoporosis without current pathological fracture: Secondary | ICD-10-CM

## 2014-12-17 DIAGNOSIS — G894 Chronic pain syndrome: Secondary | ICD-10-CM

## 2014-12-17 DIAGNOSIS — I4891 Unspecified atrial fibrillation: Secondary | ICD-10-CM

## 2014-12-17 DIAGNOSIS — R5381 Other malaise: Secondary | ICD-10-CM

## 2014-12-17 DIAGNOSIS — Z993 Dependence on wheelchair: Secondary | ICD-10-CM | POA: Diagnosis not present

## 2014-12-17 DIAGNOSIS — L03317 Cellulitis of buttock: Secondary | ICD-10-CM

## 2014-12-17 DIAGNOSIS — I10 Essential (primary) hypertension: Secondary | ICD-10-CM | POA: Diagnosis not present

## 2014-12-17 DIAGNOSIS — E785 Hyperlipidemia, unspecified: Secondary | ICD-10-CM | POA: Diagnosis not present

## 2014-12-17 DIAGNOSIS — M1990 Unspecified osteoarthritis, unspecified site: Secondary | ICD-10-CM

## 2014-12-17 DIAGNOSIS — E118 Type 2 diabetes mellitus with unspecified complications: Secondary | ICD-10-CM

## 2014-12-17 DIAGNOSIS — N3281 Overactive bladder: Secondary | ICD-10-CM | POA: Diagnosis not present

## 2014-12-17 DIAGNOSIS — Z23 Encounter for immunization: Secondary | ICD-10-CM | POA: Diagnosis not present

## 2014-12-17 LAB — POCT GLYCOSYLATED HEMOGLOBIN (HGB A1C): Hemoglobin A1C: 6.3

## 2014-12-17 MED ORDER — GLIMEPIRIDE 2 MG PO TABS: 2.0000 mg | ORAL_TABLET | Freq: Every day | ORAL | Status: DC

## 2014-12-17 MED ORDER — ENSURE PUDDING PO PUDG: 1.0000 | Freq: Two times a day (BID) | ORAL | Status: DC

## 2014-12-17 MED ORDER — SULFAMETHOXAZOLE-TRIMETHOPRIM 800-160 MG PO TABS: 1.0000 | ORAL_TABLET | Freq: Two times a day (BID) | ORAL | Status: DC

## 2014-12-17 MED ORDER — METFORMIN HCL 500 MG PO TABS: 500.0000 mg | ORAL_TABLET | Freq: Every day | ORAL | Status: DC

## 2014-12-17 MED ORDER — HYDROCODONE-ACETAMINOPHEN 5-325 MG PO TABS: ORAL_TABLET | ORAL | Status: DC

## 2014-12-17 NOTE — Addendum Note (Signed)
Addended by: Rolena Infante on: 12/17/2014 11:37 AM   Modules accepted: Orders

## 2014-12-17 NOTE — Progress Notes (Signed)
Subjective:    Patient ID: Elizabeth Drake, female    DOB: [birth year], 74 y.o.   MRN: [id number]  HPI  Patient here today for follow up of chronic medical problems.  Hypertension This is a chronic problem. The current episode started more than 1 year ago. The problem is unchanged. The problem is controlled. Pertinent negatives include no chest pain, headaches, neck pain, palpitations, peripheral edema or shortness of breath. There are no associated agents to hypertension. Risk factors for coronary artery disease include dyslipidemia, family history, post-menopausal state and sedentary lifestyle. Past treatments include beta blockers. The current treatment provides moderate improvement. Hypertensive end-organ damage includes CAD/MI.  Hyperlipidemia This is a chronic problem. The current episode started more than 1 year ago. The problem is controlled. Recent lipid tests were reviewed and are normal. Exacerbating diseases include diabetes. She has no history of hypothyroidism or obesity. Pertinent negatives include no chest pain or shortness of breath. Current antihyperlipidemic treatment includes diet change. The current treatment provides moderate improvement of lipids. Compliance problems include adherence to exercise.  Risk factors for coronary artery disease include post-menopausal, hypertension, diabetes mellitus, dyslipidemia and family history.  Diabetes She presents for her follow-up diabetic visit. She has type 2 diabetes mellitus. No MedicAlert identification noted. Her disease course has been stable. Pertinent negatives for hypoglycemia include no headaches. Pertinent negatives for diabetes include no chest pain, no polydipsia, no polyphagia, no polyuria, no weakness and no weight loss. Symptoms are stable. Risk factors for coronary artery disease include diabetes mellitus, dyslipidemia, family history, hypertension, post-menopausal and sedentary lifestyle. Current diabetic treatment includes  oral agent (dual therapy). She is compliant with treatment most of the time. Her weight is stable. She is following a low salt and diabetic diet. When asked about meal planning, she reported none. She has not had a previous visit with a dietician. She rarely participates in exercise. Her breakfast blood glucose is taken between 7-8 am. Her breakfast blood glucose range is generally 90-110 mg/dl. Her overall blood glucose range is 90-110 mg/dl. An ACE inhibitor/angiotensin II receptor blocker is not being taken. She sees a podiatrist.Eye exam is current.  Overactive bladder Has occasions when has trouble voiding-on no meds currently Atrial fib/ hx pulmonary emboli Currently on metoprolol and xeralto- no bleeding Sarcoidosis On prednisone chronic- no recent flare ups Osteoarthritis/wheel chair stricken Has a lot of stiffness but prednisone helps that and pain meds help with pain      Review of Systems  Constitutional: Negative.   HENT: Negative.   Respiratory: Negative.   Cardiovascular: Negative.   Gastrointestinal: Negative.   Genitourinary: Negative.   Neurological: Negative.   Psychiatric/Behavioral: Negative.   All other systems reviewed and are negative.      Objective:   Physical Exam  Constitutional: She is oriented to person, place, and time. She appears well-developed and well-nourished.  HENT:  Right Ear: External ear normal.  Left Ear: External ear normal.  Nose: Nose normal.  Mouth/Throat: Oropharynx is clear and moist.  Cardiovascular: Normal rate, regular rhythm and normal heart sounds.   Pulmonary/Chest: Effort normal and breath sounds normal.  Musculoskeletal:  Wheel chair confined  Neurological: She is alert and oriented to person, place, and time.  Skin: Skin is warm.  Small draining lesion on left buttocks  Psychiatric: She has a normal mood and affect. Her behavior is normal. Judgment and thought content normal.   BP 131/62 mmHg  Pulse 63  Temp(Src)  97.6 F (36.4 C) (Oral)  Ht   Wt   Results for orders placed or performed in visit on 12/17/14  POCT glycosylated hemoglobin (Hb A1C)  Result Value Ref Range   Hemoglobin A1C 6.3          Assessment & Plan:  1. Type 2 diabetes mellitus with complication Low carb diet - POCT glycosylated hemoglobin (Hb A1C) - metFORMIN (GLUCOPHAGE) 500 MG tablet; Take 1 tablet (500 mg total) by mouth daily.  Dispense: 30 tablet; Refill: 5 - glimepiride (AMARYL) 2 MG tablet; Take 1 tablet (2 mg total) by mouth daily.  Dispense: 30 tablet; Refill: 5  2. Hyperlipidemia Low fat diet - NMR, lipoprofile  3. Essential hypertension Do not add slat to diet - CMP14+EGFR  4. Osteoporosis   5. Osteoarthritis, unspecified osteoarthritis type, unspecified site  6. OAB (overactive bladder)   7. Wheelchair bound Watch for pressure ulcers  8. Atrial fibrillation with RVR Keep follow up appointment with cardiologist  9. Cellulitis of buttock   - sulfamethoxazole-trimethoprim (BACTRIM DS) 800-160 MG per tablet; Take 1 tablet by mouth 2 (two) times daily.  Dispense: 14 tablet; Refill: 0  10. Physical deconditioning - feeding supplement, ENSURE, (ENSURE) PUDG; Take 1 Container by mouth 2 (two) times daily between meals.; Refill: 0  11. Chronic pain syndrome (LEGS/BACK) - HYDROcodone-acetaminophen (NORCO/VICODIN) 5-325 MG per tablet; Take one tablet by mouth every 4 hours as needed for moderate pain. Do not exceed 4gm of Tylenol in 24 hours  Dispense: 180 tablet; Refill: 0    Labs pending Health maintenance reviewed Diet and exercise encouraged Continue all meds Follow up  In 3 months  [place], FNP

## 2014-12-17 NOTE — Patient Instructions (Signed)

## 2014-12-18 LAB — CMP14+EGFR
ALK PHOS: 55 IU/L (ref 39–117)
ALT: 11 IU/L (ref 0–32)
AST: 14 IU/L (ref 0–40)
Albumin/Globulin Ratio: 1.2 (ref 1.1–2.5)
Albumin: 3.9 g/dL (ref 3.5–4.8)
BUN/Creatinine Ratio: 30 — ABNORMAL HIGH (ref 11–26)
BUN: 17 mg/dL (ref 8–27)
Bilirubin Total: 0.4 mg/dL (ref 0.0–1.2)
CO2: 27 mmol/L (ref 18–29)
Calcium: 10 mg/dL (ref 8.7–10.3)
Chloride: 102 mmol/L (ref 97–108)
Creatinine, Ser: 0.56 mg/dL — ABNORMAL LOW (ref 0.57–1.00)
GFR calc non Af Amer: 92 mL/min/{1.73_m2} (ref >59)
GFR, EST AFRICAN AMERICAN: 105 mL/min/{1.73_m2} (ref >59)
Globulin, Total: 3.2 g/dL (ref 1.5–4.5)
Glucose: 114 mg/dL — ABNORMAL HIGH (ref 65–99)
Potassium: 3.8 mmol/L (ref 3.5–5.2)
SODIUM: 141 mmol/L (ref 134–144)
TOTAL PROTEIN: 7.1 g/dL (ref 6.0–8.5)

## 2014-12-18 LAB — NMR, LIPOPROFILE
Cholesterol: 172 mg/dL (ref 100–199)
HDL CHOLESTEROL BY NMR: 49 mg/dL (ref >39)
HDL Particle Number: 31.5 umol/L (ref >=30.5)
LDL Particle Number: 967 nmol/L (ref <1000)
LDL SIZE: 20.8 nm (ref >20.5)
LDL-C: 89 mg/dL (ref 0–99)
LP-IR SCORE: 39 (ref <=45)
Small LDL Particle Number: 291 nmol/L (ref <=527)
TRIGLYCERIDES BY NMR: 169 mg/dL — AB (ref 0–149)

## 2014-12-20 ENCOUNTER — Telehealth: Payer: Self-pay | Admitting: Nurse Practitioner

## 2014-12-20 DIAGNOSIS — S80922D Unspecified superficial injury of left lower leg, subsequent encounter: Secondary | ICD-10-CM | POA: Diagnosis not present

## 2014-12-20 DIAGNOSIS — I480 Paroxysmal atrial fibrillation: Secondary | ICD-10-CM | POA: Diagnosis not present

## 2014-12-20 DIAGNOSIS — S80921D Unspecified superficial injury of right lower leg, subsequent encounter: Secondary | ICD-10-CM | POA: Diagnosis not present

## 2014-12-20 DIAGNOSIS — E43 Unspecified severe protein-calorie malnutrition: Secondary | ICD-10-CM | POA: Diagnosis not present

## 2014-12-20 DIAGNOSIS — E119 Type 2 diabetes mellitus without complications: Secondary | ICD-10-CM | POA: Diagnosis not present

## 2014-12-20 DIAGNOSIS — D869 Sarcoidosis, unspecified: Secondary | ICD-10-CM | POA: Diagnosis not present

## 2014-12-20 NOTE — Telephone Encounter (Signed)
Patient is calling in regards to her pain medication and only wants to talk with Ronnald Collum, FNP

## 2014-12-24 DIAGNOSIS — R339 Retention of urine, unspecified: Secondary | ICD-10-CM | POA: Diagnosis not present

## 2014-12-24 DIAGNOSIS — N3941 Urge incontinence: Secondary | ICD-10-CM | POA: Diagnosis not present

## 2014-12-26 DIAGNOSIS — I480 Paroxysmal atrial fibrillation: Secondary | ICD-10-CM | POA: Diagnosis not present

## 2014-12-26 DIAGNOSIS — S80921D Unspecified superficial injury of right lower leg, subsequent encounter: Secondary | ICD-10-CM | POA: Diagnosis not present

## 2014-12-26 DIAGNOSIS — D869 Sarcoidosis, unspecified: Secondary | ICD-10-CM | POA: Diagnosis not present

## 2014-12-26 DIAGNOSIS — S80922D Unspecified superficial injury of left lower leg, subsequent encounter: Secondary | ICD-10-CM | POA: Diagnosis not present

## 2014-12-26 DIAGNOSIS — E43 Unspecified severe protein-calorie malnutrition: Secondary | ICD-10-CM | POA: Diagnosis not present

## 2014-12-26 DIAGNOSIS — E119 Type 2 diabetes mellitus without complications: Secondary | ICD-10-CM | POA: Diagnosis not present

## 2015-01-03 DIAGNOSIS — I480 Paroxysmal atrial fibrillation: Secondary | ICD-10-CM | POA: Diagnosis not present

## 2015-01-03 DIAGNOSIS — S80922D Unspecified superficial injury of left lower leg, subsequent encounter: Secondary | ICD-10-CM | POA: Diagnosis not present

## 2015-01-03 DIAGNOSIS — E43 Unspecified severe protein-calorie malnutrition: Secondary | ICD-10-CM | POA: Diagnosis not present

## 2015-01-03 DIAGNOSIS — D869 Sarcoidosis, unspecified: Secondary | ICD-10-CM | POA: Diagnosis not present

## 2015-01-03 DIAGNOSIS — E119 Type 2 diabetes mellitus without complications: Secondary | ICD-10-CM | POA: Diagnosis not present

## 2015-01-03 DIAGNOSIS — S80921D Unspecified superficial injury of right lower leg, subsequent encounter: Secondary | ICD-10-CM | POA: Diagnosis not present

## 2015-01-07 DIAGNOSIS — M47816 Spondylosis without myelopathy or radiculopathy, lumbar region: Secondary | ICD-10-CM | POA: Diagnosis not present

## 2015-01-07 DIAGNOSIS — D8689 Sarcoidosis of other sites: Secondary | ICD-10-CM | POA: Diagnosis not present

## 2015-01-08 ENCOUNTER — Telehealth: Payer: Self-pay | Admitting: Nurse Practitioner

## 2015-01-08 NOTE — Telephone Encounter (Signed)
Pt notified that Mary-Margaret will RX Prednisone Verbalizes understanding

## 2015-01-08 NOTE — Telephone Encounter (Signed)
That will be fine. 

## 2015-01-08 NOTE — Telephone Encounter (Signed)
Please review and advise.

## 2015-01-25 DIAGNOSIS — L84 Corns and callosities: Secondary | ICD-10-CM | POA: Diagnosis not present

## 2015-01-25 DIAGNOSIS — B351 Tinea unguium: Secondary | ICD-10-CM | POA: Diagnosis not present

## 2015-01-25 DIAGNOSIS — E1151 Type 2 diabetes mellitus with diabetic peripheral angiopathy without gangrene: Secondary | ICD-10-CM | POA: Diagnosis not present

## 2015-01-27 ENCOUNTER — Other Ambulatory Visit: Payer: Self-pay | Admitting: Nurse Practitioner

## 2015-01-27 DIAGNOSIS — G894 Chronic pain syndrome: Secondary | ICD-10-CM

## 2015-01-28 MED ORDER — HYDROCODONE-ACETAMINOPHEN 5-325 MG PO TABS: ORAL_TABLET | ORAL | Status: DC

## 2015-01-28 MED ORDER — PREDNISONE 5 MG PO TABS: 5.0000 mg | ORAL_TABLET | Freq: Every day | ORAL | Status: DC

## 2015-01-28 NOTE — Telephone Encounter (Signed)
Patient aware that script can be picked up at front desk with photo ID 

## 2015-01-28 NOTE — Telephone Encounter (Signed)
RX ready for pick up 

## 2015-01-28 NOTE — Telephone Encounter (Signed)
Needs prednisone and hydrocodone. Prednisone was previously prescribed by rheumatologist but he is leaving the practice.  Patient aware that Elizabeth Drake is out of the office today and will return on Mon 01/31/15.

## 2015-02-28 ENCOUNTER — Telehealth: Payer: Self-pay | Admitting: Nurse Practitioner

## 2015-02-28 DIAGNOSIS — G894 Chronic pain syndrome: Secondary | ICD-10-CM

## 2015-02-28 MED ORDER — HYDROCODONE-ACETAMINOPHEN 5-325 MG PO TABS: ORAL_TABLET | ORAL | Status: DC

## 2015-02-28 NOTE — Telephone Encounter (Signed)
Hydrocodone rx ready for pick up 

## 2015-02-28 NOTE — Telephone Encounter (Signed)
Pt aware written Rx will be at front desk ready for pickup after lunch

## 2015-03-13 ENCOUNTER — Other Ambulatory Visit: Payer: Self-pay | Admitting: Nurse Practitioner

## 2015-03-28 ENCOUNTER — Ambulatory Visit: Payer: Medicare Other | Admitting: Nurse Practitioner

## 2015-03-28 ENCOUNTER — Other Ambulatory Visit: Payer: Self-pay | Admitting: Family

## 2015-03-28 DIAGNOSIS — R339 Retention of urine, unspecified: Secondary | ICD-10-CM | POA: Diagnosis not present

## 2015-03-28 DIAGNOSIS — R350 Frequency of micturition: Secondary | ICD-10-CM | POA: Diagnosis not present

## 2015-03-28 DIAGNOSIS — N3020 Other chronic cystitis without hematuria: Secondary | ICD-10-CM | POA: Diagnosis not present

## 2015-03-28 DIAGNOSIS — N3941 Urge incontinence: Secondary | ICD-10-CM | POA: Diagnosis not present

## 2015-04-04 ENCOUNTER — Encounter: Payer: Self-pay | Admitting: Nurse Practitioner

## 2015-04-04 ENCOUNTER — Ambulatory Visit (INDEPENDENT_AMBULATORY_CARE_PROVIDER_SITE_OTHER): Payer: Medicare Other | Admitting: Nurse Practitioner

## 2015-04-04 VITALS — BP 121/66 | HR 68 | Temp 98.0°F

## 2015-04-04 DIAGNOSIS — I4891 Unspecified atrial fibrillation: Secondary | ICD-10-CM | POA: Diagnosis not present

## 2015-04-04 DIAGNOSIS — I10 Essential (primary) hypertension: Secondary | ICD-10-CM

## 2015-04-04 DIAGNOSIS — Z993 Dependence on wheelchair: Secondary | ICD-10-CM | POA: Diagnosis not present

## 2015-04-04 DIAGNOSIS — M810 Age-related osteoporosis without current pathological fracture: Secondary | ICD-10-CM | POA: Diagnosis not present

## 2015-04-04 DIAGNOSIS — E785 Hyperlipidemia, unspecified: Secondary | ICD-10-CM

## 2015-04-04 DIAGNOSIS — N3281 Overactive bladder: Secondary | ICD-10-CM | POA: Diagnosis not present

## 2015-04-04 DIAGNOSIS — R5381 Other malaise: Secondary | ICD-10-CM

## 2015-04-04 DIAGNOSIS — G894 Chronic pain syndrome: Secondary | ICD-10-CM

## 2015-04-04 DIAGNOSIS — E118 Type 2 diabetes mellitus with unspecified complications: Secondary | ICD-10-CM | POA: Diagnosis not present

## 2015-04-04 LAB — POCT GLYCOSYLATED HEMOGLOBIN (HGB A1C): HEMOGLOBIN A1C: 6.1

## 2015-04-04 MED ORDER — METOPROLOL TARTRATE 25 MG PO TABS: 25.0000 mg | ORAL_TABLET | Freq: Two times a day (BID) | ORAL | Status: DC

## 2015-04-04 MED ORDER — RIVAROXABAN 20 MG PO TABS: ORAL_TABLET | ORAL | Status: DC

## 2015-04-04 MED ORDER — GLIMEPIRIDE 2 MG PO TABS: 2.0000 mg | ORAL_TABLET | Freq: Every day | ORAL | Status: DC

## 2015-04-04 MED ORDER — METFORMIN HCL 500 MG PO TABS: 500.0000 mg | ORAL_TABLET | Freq: Every day | ORAL | Status: DC

## 2015-04-04 NOTE — Progress Notes (Signed)
Subjective:    Patient ID: Elizabeth Drake, female    DOB: 08/27/1940, 74 y.o.   MRN: [id number]  HPI   Patient here today for follow up of chronic medical problems.  Hypertension This is a chronic problem. The current episode started more than 1 year ago. The problem is unchanged. The problem is controlled. Pertinent negatives include no chest pain, headaches, neck pain, palpitations, peripheral edema or shortness of breath. There are no associated agents to hypertension. Risk factors for coronary artery disease include dyslipidemia, family history, post-menopausal state and sedentary lifestyle. Past treatments include beta blockers. The current treatment provides moderate improvement. Hypertensive end-organ damage includes CAD/MI.  Hyperlipidemia This is a chronic problem. The current episode started more than 1 year ago. The problem is controlled. Recent lipid tests were reviewed and are normal. Exacerbating diseases include diabetes. She has no history of hypothyroidism or obesity. Pertinent negatives include no chest pain or shortness of breath. Current antihyperlipidemic treatment includes diet change. The current treatment provides moderate improvement of lipids. Compliance problems include adherence to exercise.  Risk factors for coronary artery disease include post-menopausal, hypertension, diabetes mellitus, dyslipidemia and family history.  Diabetes She presents for her follow-up diabetic visit. She has type 2 diabetes mellitus. No MedicAlert identification noted. Her disease course has been stable. Pertinent negatives for hypoglycemia include no headaches. Pertinent negatives for diabetes include no chest pain, no polydipsia, no polyphagia, no polyuria, no weakness and no weight loss. Symptoms are stable. Risk factors for coronary artery disease include diabetes mellitus, dyslipidemia, family history, hypertension, post-menopausal and sedentary lifestyle. Current diabetic treatment includes  oral agent (dual therapy). She is compliant with treatment most of the time. Her weight is stable. She is following a low salt and diabetic diet. When asked about meal planning, she reported none. She has not had a previous visit with a dietician. She rarely participates in exercise. Her breakfast blood glucose is taken between 7-8 am. Her breakfast blood glucose range is generally 90-110 mg/dl. Her overall blood glucose range is 90-110 mg/dl. An ACE inhibitor/angiotensin II receptor blocker is not being taken. She sees a podiatrist.Eye exam is current.  Overactive bladder Has occasions when has trouble voiding-on no meds currently Atrial fib/ hx pulmonary emboli Currently on metoprolol and xeralto- no bleeding Sarcoidosis On prednisone chronic- no recent flare ups Osteoarthritis/wheel chair stricken Has a lot of stiffness but prednisone helps that and pain meds help with pain Physical deconditioning ssays taht she eats good but can't seem to maintain weight.    Review of Systems  Constitutional: Negative.   HENT: Negative.   Respiratory: Negative.   Cardiovascular: Negative.   Gastrointestinal: Negative.   Genitourinary: Negative.   Neurological: Negative.   Psychiatric/Behavioral: Negative.   All other systems reviewed and are negative.      Objective:   Physical Exam  Constitutional: She is oriented to person, place, and time. She appears well-developed and well-nourished.  HENT:  Right Ear: External ear normal.  Left Ear: External ear normal.  Nose: Nose normal.  Mouth/Throat: Oropharynx is clear and moist.  Cardiovascular: Normal rate, regular rhythm and normal heart sounds.   Pulmonary/Chest: Effort normal and breath sounds normal.  Abdominal: Soft. Bowel sounds are normal.  Musculoskeletal:  Wheel chair confined  Neurological: She is alert and oriented to person, place, and time.  Skin: Skin is warm.  multiple scrapes and cuts bil lower ext  Psychiatric: She has a  normal mood and affect. Her behavior is normal. Judgment  and thought content normal.   BP 121/66 mmHg  Pulse 68  Temp(Src) 98 F (36.7 C) (Oral)  Ht   Wt   Results for orders placed or performed in visit on 04/04/15  POCT glycosylated hemoglobin (Hb A1C)  Result Value Ref Range   Hemoglobin A1C 6.1           Assessment & Plan:  1. Type 2 diabetes mellitus with complication Low carb diet - POCT glycosylated hemoglobin (Hb A1C) - metFORMIN (GLUCOPHAGE) 500 MG tablet; Take 1 tablet (500 mg total) by mouth daily.  Dispense: 30 tablet; Refill: 5 - glimepiride (AMARYL) 2 MG tablet; Take 1 tablet (2 mg total) by mouth daily.  Dispense: 30 tablet; Refill: 5  2. Essential hypertension Do not add salt to diet - CMP14+EGFR - metoprolol tartrate (LOPRESSOR) 25 MG tablet; Take 1 tablet (25 mg total) by mouth 2 (two) times daily.  Dispense: 60 tablet; Refill: 3  3. Hyperlipidemia Low fat diet  - Lipid panel  4. Osteoporosis  5. Wheelchair bound  6. Chronic pain syndrome (LEGS/BACK)  7. Physical deconditioning  8. OAB (overactive bladder)  9. Atrial fibrillation with RVR - rivaroxaban (XARELTO) 20 MG TABS tablet; TAKE ONE TABLET BY MOUTH ONE TIME DAILY WITH SUPPER  Dispense: 30 tablet; Refill: 5    Labs pending Health maintenance reviewed Diet and exercise encouraged Continue all meds Follow up  In 3 months   [place], FNP

## 2015-04-04 NOTE — Patient Instructions (Signed)

## 2015-04-05 ENCOUNTER — Telehealth: Payer: Self-pay | Admitting: Nurse Practitioner

## 2015-04-05 DIAGNOSIS — B351 Tinea unguium: Secondary | ICD-10-CM | POA: Diagnosis not present

## 2015-04-05 DIAGNOSIS — E1151 Type 2 diabetes mellitus with diabetic peripheral angiopathy without gangrene: Secondary | ICD-10-CM | POA: Diagnosis not present

## 2015-04-05 DIAGNOSIS — L84 Corns and callosities: Secondary | ICD-10-CM | POA: Diagnosis not present

## 2015-04-05 DIAGNOSIS — G894 Chronic pain syndrome: Secondary | ICD-10-CM

## 2015-04-05 LAB — LIPID PANEL
CHOLESTEROL TOTAL: 171 mg/dL (ref 100–199)
Chol/HDL Ratio: 3 ratio units (ref 0.0–4.4)
HDL: 57 mg/dL (ref >39)
LDL CALC: 91 mg/dL (ref 0–99)
Triglycerides: 115 mg/dL (ref 0–149)
VLDL Cholesterol Cal: 23 mg/dL (ref 5–40)

## 2015-04-05 LAB — CMP14+EGFR
ALT: 9 IU/L (ref 0–32)
AST: 14 IU/L (ref 0–40)
Albumin/Globulin Ratio: 1.2 (ref 1.1–2.5)
Albumin: 3.8 g/dL (ref 3.5–4.8)
Alkaline Phosphatase: 48 IU/L (ref 39–117)
BILIRUBIN TOTAL: 0.3 mg/dL (ref 0.0–1.2)
BUN/Creatinine Ratio: 29 — ABNORMAL HIGH (ref 11–26)
BUN: 17 mg/dL (ref 8–27)
CHLORIDE: 102 mmol/L (ref 97–108)
CO2: 23 mmol/L (ref 18–29)
Calcium: 9.7 mg/dL (ref 8.7–10.3)
Creatinine, Ser: 0.58 mg/dL (ref 0.57–1.00)
GFR calc non Af Amer: 90 mL/min/{1.73_m2} (ref >59)
GFR, EST AFRICAN AMERICAN: 104 mL/min/{1.73_m2} (ref >59)
Globulin, Total: 3.1 g/dL (ref 1.5–4.5)
Glucose: 117 mg/dL — ABNORMAL HIGH (ref 65–99)
Potassium: 4.3 mmol/L (ref 3.5–5.2)
Sodium: 142 mmol/L (ref 134–144)
Total Protein: 6.9 g/dL (ref 6.0–8.5)

## 2015-04-05 MED ORDER — HYDROCODONE-ACETAMINOPHEN 5-325 MG PO TABS: ORAL_TABLET | ORAL | Status: DC

## 2015-04-05 NOTE — Telephone Encounter (Signed)
norco ready for pick up- decreased amount to no more then 3 per day

## 2015-04-07 MED ORDER — HYDROCODONE-ACETAMINOPHEN 5-325 MG PO TABS: ORAL_TABLET | ORAL | Status: DC

## 2015-04-07 NOTE — Telephone Encounter (Signed)
Patient wants to know why you are decreasing her pain medication. She states that she has been getting a 180 for the last couple of years

## 2015-04-07 NOTE — Telephone Encounter (Signed)
Needs to bring other rx back and we will exchange it

## 2015-04-07 NOTE — Telephone Encounter (Signed)
rx was written on 04/05/15- please see if it is up front to pick up.

## 2015-04-29 DIAGNOSIS — R3915 Urgency of urination: Secondary | ICD-10-CM | POA: Diagnosis not present

## 2015-04-29 DIAGNOSIS — N3941 Urge incontinence: Secondary | ICD-10-CM | POA: Diagnosis not present

## 2015-04-29 DIAGNOSIS — R350 Frequency of micturition: Secondary | ICD-10-CM | POA: Diagnosis not present

## 2015-05-03 ENCOUNTER — Other Ambulatory Visit: Payer: Self-pay | Admitting: Family

## 2015-05-06 DIAGNOSIS — R3915 Urgency of urination: Secondary | ICD-10-CM | POA: Diagnosis not present

## 2015-05-06 DIAGNOSIS — R350 Frequency of micturition: Secondary | ICD-10-CM | POA: Diagnosis not present

## 2015-05-06 DIAGNOSIS — N3941 Urge incontinence: Secondary | ICD-10-CM | POA: Diagnosis not present

## 2015-05-09 ENCOUNTER — Encounter: Payer: Self-pay | Admitting: Nurse Practitioner

## 2015-05-09 ENCOUNTER — Ambulatory Visit (INDEPENDENT_AMBULATORY_CARE_PROVIDER_SITE_OTHER): Payer: Medicare Other | Admitting: Nurse Practitioner

## 2015-05-09 VITALS — BP 145/69 | HR 64 | Temp 97.5°F

## 2015-05-09 DIAGNOSIS — L03119 Cellulitis of unspecified part of limb: Secondary | ICD-10-CM

## 2015-05-09 DIAGNOSIS — L02419 Cutaneous abscess of limb, unspecified: Secondary | ICD-10-CM | POA: Diagnosis not present

## 2015-05-09 MED ORDER — SULFAMETHOXAZOLE-TRIMETHOPRIM 800-160 MG PO TABS: 1.0000 | ORAL_TABLET | Freq: Two times a day (BID) | ORAL | Status: DC

## 2015-05-09 NOTE — Patient Instructions (Signed)

## 2015-05-09 NOTE — Progress Notes (Signed)
   Subjective:    Patient ID: Elizabeth Drake, female    DOB: [birth year], 74 y.o.   MRN: [id number]  HPI Patient brought in by husband with c/o knot on left upper thigh- red swollen and hot to touch. Denies any injury.    Review of Systems  Constitutional: Negative.   HENT: Negative.   Respiratory: Negative.   Cardiovascular: Negative.   Gastrointestinal: Negative.   Genitourinary: Negative.   Musculoskeletal: Negative.   Neurological: Negative.   Psychiatric/Behavioral: Negative.   All other systems reviewed and are negative.      Objective:   Physical Exam  Constitutional: She is oriented to person, place, and time. She appears well-developed and well-nourished.  Cardiovascular: Normal rate, regular rhythm and normal heart sounds.   Pulmonary/Chest: Effort normal and breath sounds normal.  Neurological: She is alert and oriented to person, place, and time.  Skin: Skin is warm. There is erythema (8cm annular indurated edematous hot to touch lesion on left uper thigh.).    BP 145/69 mmHg  Pulse 64  Temp(Src) 97.5 F (36.4 C) (Oral)  Ht   Wt        Assessment & Plan:   1. Cellulitis and abscess of leg    Meds ordered this encounter  Medications  . sulfamethoxazole-trimethoprim (BACTRIM DS) 800-160 MG tablet    Sig: Take 1 tablet by mouth 2 (two) times daily.    Dispense:  14 tablet    Refill:  0    Order Specific Question:  Supervising Provider    Answer:  Chipper Herb [[id number]]   Apply warm compresses BID RTO if not improving - may need rocephin shot  Mary-Margaret Hassell Done, FNP

## 2015-05-17 ENCOUNTER — Ambulatory Visit: Payer: Medicare Other | Admitting: Nurse Practitioner

## 2015-05-23 ENCOUNTER — Other Ambulatory Visit: Payer: Self-pay | Admitting: Nurse Practitioner

## 2015-05-23 DIAGNOSIS — G894 Chronic pain syndrome: Secondary | ICD-10-CM

## 2015-05-23 MED ORDER — HYDROCODONE-ACETAMINOPHEN 5-325 MG PO TABS: ORAL_TABLET | ORAL | Status: DC

## 2015-05-23 NOTE — Telephone Encounter (Signed)
Patient aware that rx is ready to be picked up.  

## 2015-05-23 NOTE — Telephone Encounter (Signed)
Pain rx ready for pick up  

## 2015-06-14 DIAGNOSIS — L03032 Cellulitis of left toe: Secondary | ICD-10-CM | POA: Diagnosis not present

## 2015-06-15 DIAGNOSIS — N3941 Urge incontinence: Secondary | ICD-10-CM | POA: Diagnosis not present

## 2015-06-25 ENCOUNTER — Encounter (HOSPITAL_COMMUNITY): Payer: Self-pay | Admitting: Emergency Medicine

## 2015-06-25 ENCOUNTER — Inpatient Hospital Stay (HOSPITAL_COMMUNITY)
Admission: EM | Admit: 2015-06-25 | Discharge: 2015-06-30 | DRG: 956 | Disposition: A | Discharge status: Skilled Nursing Facility | Payer: Medicare Other | Attending: Internal Medicine | Admitting: Internal Medicine

## 2015-06-25 ENCOUNTER — Emergency Department (HOSPITAL_COMMUNITY): Payer: Medicare Other

## 2015-06-25 DIAGNOSIS — D62 Acute posthemorrhagic anemia: Secondary | ICD-10-CM | POA: Diagnosis not present

## 2015-06-25 DIAGNOSIS — S32512A Fracture of superior rim of left pubis, initial encounter for closed fracture: Secondary | ICD-10-CM | POA: Diagnosis present

## 2015-06-25 DIAGNOSIS — Z8249 Family history of ischemic heart disease and other diseases of the circulatory system: Secondary | ICD-10-CM

## 2015-06-25 DIAGNOSIS — S72142A Displaced intertrochanteric fracture of left femur, initial encounter for closed fracture: Principal | ICD-10-CM | POA: Diagnosis present

## 2015-06-25 DIAGNOSIS — K449 Diaphragmatic hernia without obstruction or gangrene: Secondary | ICD-10-CM | POA: Diagnosis present

## 2015-06-25 DIAGNOSIS — R52 Pain, unspecified: Secondary | ICD-10-CM | POA: Diagnosis not present

## 2015-06-25 DIAGNOSIS — Z87891 Personal history of nicotine dependence: Secondary | ICD-10-CM | POA: Diagnosis not present

## 2015-06-25 DIAGNOSIS — Z7952 Long term (current) use of systemic steroids: Secondary | ICD-10-CM

## 2015-06-25 DIAGNOSIS — W050XXA Fall from non-moving wheelchair, initial encounter: Secondary | ICD-10-CM | POA: Diagnosis present

## 2015-06-25 DIAGNOSIS — E43 Unspecified severe protein-calorie malnutrition: Secondary | ICD-10-CM | POA: Diagnosis not present

## 2015-06-25 DIAGNOSIS — I480 Paroxysmal atrial fibrillation: Secondary | ICD-10-CM | POA: Diagnosis not present

## 2015-06-25 DIAGNOSIS — Z96698 Presence of other orthopedic joint implants: Secondary | ICD-10-CM | POA: Diagnosis present

## 2015-06-25 DIAGNOSIS — Z419 Encounter for procedure for purposes other than remedying health state, unspecified: Secondary | ICD-10-CM

## 2015-06-25 DIAGNOSIS — M810 Age-related osteoporosis without current pathological fracture: Secondary | ICD-10-CM | POA: Diagnosis present

## 2015-06-25 DIAGNOSIS — K5732 Diverticulitis of large intestine without perforation or abscess without bleeding: Secondary | ICD-10-CM | POA: Diagnosis not present

## 2015-06-25 DIAGNOSIS — I447 Left bundle-branch block, unspecified: Secondary | ICD-10-CM | POA: Diagnosis present

## 2015-06-25 DIAGNOSIS — Z681 Body mass index (BMI) 19 or less, adult: Secondary | ICD-10-CM

## 2015-06-25 DIAGNOSIS — Z993 Dependence on wheelchair: Secondary | ICD-10-CM | POA: Diagnosis not present

## 2015-06-25 DIAGNOSIS — S7292XA Unspecified fracture of left femur, initial encounter for closed fracture: Secondary | ICD-10-CM | POA: Diagnosis not present

## 2015-06-25 DIAGNOSIS — M25552 Pain in left hip: Secondary | ICD-10-CM | POA: Diagnosis not present

## 2015-06-25 DIAGNOSIS — Z01818 Encounter for other preprocedural examination: Secondary | ICD-10-CM | POA: Diagnosis not present

## 2015-06-25 DIAGNOSIS — Z23 Encounter for immunization: Secondary | ICD-10-CM | POA: Diagnosis not present

## 2015-06-25 DIAGNOSIS — E119 Type 2 diabetes mellitus without complications: Secondary | ICD-10-CM | POA: Diagnosis present

## 2015-06-25 DIAGNOSIS — Z7901 Long term (current) use of anticoagulants: Secondary | ICD-10-CM

## 2015-06-25 DIAGNOSIS — Z79899 Other long term (current) drug therapy: Secondary | ICD-10-CM

## 2015-06-25 DIAGNOSIS — K5900 Constipation, unspecified: Secondary | ICD-10-CM | POA: Diagnosis not present

## 2015-06-25 DIAGNOSIS — Z902 Acquired absence of lung [part of]: Secondary | ICD-10-CM | POA: Diagnosis not present

## 2015-06-25 DIAGNOSIS — E118 Type 2 diabetes mellitus with unspecified complications: Secondary | ICD-10-CM

## 2015-06-25 DIAGNOSIS — T380X5A Adverse effect of glucocorticoids and synthetic analogues, initial encounter: Secondary | ICD-10-CM | POA: Diagnosis present

## 2015-06-25 DIAGNOSIS — Z01810 Encounter for preprocedural cardiovascular examination: Secondary | ICD-10-CM | POA: Diagnosis not present

## 2015-06-25 DIAGNOSIS — Z872 Personal history of diseases of the skin and subcutaneous tissue: Secondary | ICD-10-CM | POA: Diagnosis not present

## 2015-06-25 DIAGNOSIS — Z90710 Acquired absence of both cervix and uterus: Secondary | ICD-10-CM | POA: Diagnosis not present

## 2015-06-25 DIAGNOSIS — Z7984 Long term (current) use of oral hypoglycemic drugs: Secondary | ICD-10-CM

## 2015-06-25 DIAGNOSIS — R278 Other lack of coordination: Secondary | ICD-10-CM | POA: Diagnosis not present

## 2015-06-25 DIAGNOSIS — Z86711 Personal history of pulmonary embolism: Secondary | ICD-10-CM

## 2015-06-25 DIAGNOSIS — D649 Anemia, unspecified: Secondary | ICD-10-CM | POA: Diagnosis present

## 2015-06-25 DIAGNOSIS — D869 Sarcoidosis, unspecified: Secondary | ICD-10-CM

## 2015-06-25 DIAGNOSIS — Z9181 History of falling: Secondary | ICD-10-CM | POA: Diagnosis not present

## 2015-06-25 DIAGNOSIS — S32502A Unspecified fracture of left pubis, initial encounter for closed fracture: Secondary | ICD-10-CM | POA: Diagnosis not present

## 2015-06-25 DIAGNOSIS — M6281 Muscle weakness (generalized): Secondary | ICD-10-CM | POA: Diagnosis not present

## 2015-06-25 DIAGNOSIS — L8990 Pressure ulcer of unspecified site, unspecified stage: Secondary | ICD-10-CM | POA: Insufficient documentation

## 2015-06-25 DIAGNOSIS — I272 Other secondary pulmonary hypertension: Secondary | ICD-10-CM | POA: Diagnosis not present

## 2015-06-25 DIAGNOSIS — S72002A Fracture of unspecified part of neck of left femur, initial encounter for closed fracture: Secondary | ICD-10-CM

## 2015-06-25 DIAGNOSIS — M21252 Flexion deformity, left hip: Secondary | ICD-10-CM | POA: Diagnosis not present

## 2015-06-25 DIAGNOSIS — E1165 Type 2 diabetes mellitus with hyperglycemia: Secondary | ICD-10-CM | POA: Diagnosis not present

## 2015-06-25 DIAGNOSIS — I82409 Acute embolism and thrombosis of unspecified deep veins of unspecified lower extremity: Secondary | ICD-10-CM | POA: Diagnosis not present

## 2015-06-25 LAB — URINALYSIS, ROUTINE W REFLEX MICROSCOPIC
BILIRUBIN URINE: NEGATIVE
GLUCOSE, UA: NEGATIVE mg/dL
KETONES UR: NEGATIVE mg/dL
Leukocytes, UA: NEGATIVE
Nitrite: NEGATIVE
PROTEIN: NEGATIVE mg/dL
Specific Gravity, Urine: 1.015 (ref 1.005–1.030)
pH: 8.5 — ABNORMAL HIGH (ref 5.0–8.0)

## 2015-06-25 LAB — CBC WITH DIFFERENTIAL/PLATELET
BASOS ABS: 0 10*3/uL (ref 0.0–0.1)
Basophils Relative: 0 %
EOS ABS: 0 10*3/uL (ref 0.0–0.7)
EOS PCT: 0 %
HCT: 38.8 % (ref 36.0–46.0)
Hemoglobin: 12.5 g/dL (ref 12.0–15.0)
LYMPHS PCT: 17 %
Lymphs Abs: 2.3 10*3/uL (ref 0.7–4.0)
MCH: 31.9 pg (ref 26.0–34.0)
MCHC: 32.2 g/dL (ref 30.0–36.0)
MCV: 99 fL (ref 78.0–100.0)
MONO ABS: 0.7 10*3/uL (ref 0.1–1.0)
Monocytes Relative: 5 %
Neutro Abs: 10.6 10*3/uL — ABNORMAL HIGH (ref 1.7–7.7)
Neutrophils Relative %: 78 %
PLATELETS: 234 10*3/uL (ref 150–400)
RBC: 3.92 MIL/uL (ref 3.87–5.11)
RDW: 12.1 % (ref 11.5–15.5)
WBC: 13.7 10*3/uL — AB (ref 4.0–10.5)

## 2015-06-25 LAB — BASIC METABOLIC PANEL
ANION GAP: 8 (ref 5–15)
BUN: 14 mg/dL (ref 6–20)
CALCIUM: 10.5 mg/dL — AB (ref 8.9–10.3)
CO2: 28 mmol/L (ref 22–32)
Chloride: 105 mmol/L (ref 101–111)
Creatinine, Ser: 0.71 mg/dL (ref 0.44–1.00)
GLUCOSE: 99 mg/dL (ref 65–99)
POTASSIUM: 4.1 mmol/L (ref 3.5–5.1)
SODIUM: 141 mmol/L (ref 135–145)

## 2015-06-25 LAB — URINE MICROSCOPIC-ADD ON

## 2015-06-25 LAB — TYPE AND SCREEN
ABO/RH(D): A POS
ANTIBODY SCREEN: NEGATIVE

## 2015-06-25 LAB — GLUCOSE, CAPILLARY: GLUCOSE-CAPILLARY: 144 mg/dL — AB (ref 65–99)

## 2015-06-25 MED ORDER — MORPHINE SULFATE (PF) 4 MG/ML IV SOLN
4.0000 mg | INTRAVENOUS | Status: DC | PRN
Start: 2015-06-25 — End: 2015-06-27
  Administered 2015-06-25 – 2015-06-27 (×9): 4 mg via INTRAVENOUS
  Filled 2015-06-25 (×9): qty 1

## 2015-06-25 MED ORDER — HYDROCODONE-ACETAMINOPHEN 5-325 MG PO TABS: 1.0000 | ORAL_TABLET | Freq: Four times a day (QID) | ORAL | Status: DC | PRN

## 2015-06-25 MED ORDER — MORPHINE SULFATE (PF) 2 MG/ML IV SOLN: 0.5000 mg | INTRAVENOUS | Status: DC | PRN

## 2015-06-25 MED ORDER — PREDNISONE 5 MG PO TABS
5.0000 mg | ORAL_TABLET | Freq: Every day | ORAL | Status: DC
  Filled 2015-06-25: qty 1

## 2015-06-25 MED ORDER — MORPHINE SULFATE (PF) 4 MG/ML IV SOLN
4.0000 mg | Freq: Once | INTRAVENOUS | Status: AC
  Administered 2015-06-25: 4 mg via INTRAVENOUS
  Filled 2015-06-25: qty 1

## 2015-06-25 MED ORDER — INSULIN ASPART 100 UNIT/ML ~~LOC~~ SOLN
0.0000 [IU] | SUBCUTANEOUS | Status: DC
  Administered 2015-06-26: 1 [IU] via SUBCUTANEOUS
  Administered 2015-06-26 – 2015-06-28 (×3): 2 [IU] via SUBCUTANEOUS
  Administered 2015-06-28 (×2): 3 [IU] via SUBCUTANEOUS
  Administered 2015-06-28: 2 [IU] via SUBCUTANEOUS
  Administered 2015-06-29: 3 [IU] via SUBCUTANEOUS
  Administered 2015-06-29 (×3): 2 [IU] via SUBCUTANEOUS
  Administered 2015-06-29: 1 [IU] via SUBCUTANEOUS
  Administered 2015-06-30: 2 [IU] via SUBCUTANEOUS
  Administered 2015-06-30: 1 [IU] via SUBCUTANEOUS
  Administered 2015-06-30: 2 [IU] via SUBCUTANEOUS

## 2015-06-25 MED ORDER — DOCUSATE SODIUM 100 MG PO CAPS: 100.0000 mg | ORAL_CAPSULE | Freq: Every day | ORAL | Status: DC | PRN

## 2015-06-25 MED ORDER — SULFAMETHOXAZOLE-TRIMETHOPRIM 800-160 MG PO TABS
1.0000 | ORAL_TABLET | Freq: Two times a day (BID) | ORAL | Status: AC
  Administered 2015-06-26 – 2015-06-27 (×4): 1 via ORAL
  Filled 2015-06-25 (×4): qty 1

## 2015-06-25 MED ORDER — METOPROLOL TARTRATE 25 MG PO TABS: 25.0000 mg | ORAL_TABLET | Freq: Two times a day (BID) | ORAL | Status: DC

## 2015-06-25 NOTE — ED Notes (Signed)
Attempted to place pt on fracture pan so she could urinate. Despite her reports prior that she needed to void, once on the pan she was unable to eliminate. Bladder scan obtained, showing approx 479mL of retained urine. Spoke with MD, who agreed to place Foley catheter.

## 2015-06-25 NOTE — ED Notes (Signed)
Patient transported to CT 

## 2015-06-25 NOTE — ED Provider Notes (Signed)
CSN: [id number]     Arrival date & time 06/25/15  1709 History   First MD Initiated Contact with Patient 06/25/15 1726     Chief Complaint  Patient presents with  . Fall  . Hip Pain     (Consider location/radiation/quality/duration/timing/severity/associated sxs/prior Treatment) HPI Patient fell immediately prior to coming here while attempting to stand from her wheelchair. She complains of left-sided para sacral pain since the event. Pain is worse with changing positions improved with remaining still. No other injury. She was treated I EMS with intravenous morphine. No other associated symptoms. Past Medical History  Diagnosis Date  . Sarcoidosis ([place])   . Hiatal hernia   . Diabetes mellitus without complication ([place])   . Osteoporosis   . DVT (deep venous thrombosis) ([place])   . Pulmonary emboli [facility])    Past Surgical History  Procedure Laterality Date  . Abdominal hysterectomy    . Joint replacement      toe  . Lung lobectomy Left     Lower  . I&d extremity Left 09/13/2014    Procedure: IRRIGATION AND DEBRIDEMENT EXTREMITY;  Surgeon: Linna Hoff, MD;  Location: [place];  Service: Orthopedics;  Laterality: Left;   Family History  Problem Relation Age of Onset  . Heart disease Mother   . Hypertension Mother   . Cancer Father     pancratic  . Cancer - Other Sister    Social History  Substance Use Topics  . Smoking status: Former Smoker    Types: Cigarettes    Quit date: 12/31/2000  . Smokeless tobacco: None  . Alcohol Use: No   OB History    No data available     Review of Systems  Constitutional: Negative.   HENT: Negative.   Respiratory: Negative.   Cardiovascular: Negative.   Gastrointestinal: Negative.   Musculoskeletal: Positive for back pain and gait problem.       Wheelchair-bound  Skin: Negative.   Allergic/Immunologic: Positive for immunocompromised state.  Neurological: Negative.   Psychiatric/Behavioral: Negative.   All other systems reviewed  and are negative.     Allergies  Advil; Azo; Indocin; Keflex; Loratadine; and Percocet  Home Medications   Prior to Admission medications   Medication Sig Start Date End Date Taking? Authorizing Provider  calcium-vitamin D (OSCAL 500/200 D-3) 500-200 MG-UNIT per tablet Take 1 tablet by mouth daily with breakfast.    Historical Provider, MD  docusate sodium (COLACE) 100 MG capsule Take 1 capsule (100 mg total) by mouth 2 (two) times daily. 09/16/14   Maryann Mikhail, DO  feeding supplement, ENSURE, (ENSURE) PUDG Take 1 Container by mouth 2 (two) times daily between meals. 12/17/14   Mary-Margaret Hassell Done, FNP  glimepiride (AMARYL) 2 MG tablet Take 1 tablet (2 mg total) by mouth daily. 04/04/15   Mary-Margaret Hassell Done, FNP  HYDROcodone-acetaminophen (NORCO/VICODIN) 5-325 MG tablet Take one tablet by mouth every 4 hours as needed for moderate pain. Do not exceed 4gm of Tylenol in 24 hours 05/23/15   Mary-Margaret Hassell Done, FNP  metFORMIN (GLUCOPHAGE) 500 MG tablet Take 1 tablet (500 mg total) by mouth daily. 04/04/15   Mary-Margaret Hassell Done, FNP  metoprolol tartrate (LOPRESSOR) 25 MG tablet Take 1 tablet (25 mg total) by mouth 2 (two) times daily. 04/04/15   Mary-Margaret Hassell Done, FNP  mometasone (NASONEX) 50 MCG/ACT nasal spray 2 sprays per nostril qd Patient taking differently: Place 2 sprays into the nose daily as needed (allergies). 2 sprays per nostril qd 12/21/13   Lysbeth Penner,  FNP  predniSONE (DELTASONE) 5 MG tablet TAKE ONE TABLET BY MOUTH DAILY WITH BREAKFAST 05/03/15   Claretta Fraise, MD  rivaroxaban (XARELTO) 20 MG TABS tablet TAKE ONE TABLET BY MOUTH ONE TIME DAILY WITH SUPPER 04/04/15   Mary-Margaret Hassell Done, FNP  sulfamethoxazole-trimethoprim (BACTRIM DS) 800-160 MG tablet Take 1 tablet by mouth 2 (two) times daily. 05/09/15   Mary-Margaret Hassell Done, FNP   BP 146/70 mmHg  Pulse 84  Temp(Src) 98.1 F (36.7 C)  Resp 15  Ht 5\' 6"  (1.676 m)  Wt 110 lb (49.896 kg)  BMI 17.76 kg/m2  SpO2  95% Physical Exam  Constitutional:  Frail, chronically ill-appearing  HENT:  Head: Normocephalic and atraumatic.  Eyes: Conjunctivae are normal. Pupils are equal, round, and reactive to light.  Neck: Neck supple. No tracheal deviation present. No thyromegaly present.  Cardiovascular: Normal rate and regular rhythm.   No murmur heard. Pulmonary/Chest: Effort normal and breath sounds normal.  Abdominal: Soft. Bowel sounds are normal. She exhibits no distension. There is no tenderness.  Musculoskeletal: Normal range of motion. She exhibits no edema or tenderness.  All 4 extremities without deformity or swelling. No pain on internal or external rotation of either thigh. Pelvis Tender at left para sacral area. No crepitance  Neurological: She is alert. Coordination normal.  Skin: Skin is warm and dry. No rash noted.  Psychiatric: She has a normal mood and affect.  Nursing note and vitals reviewed.   ED Course  Procedures (including critical care time) Labs Review Labs Reviewed - No data to display  Imaging Review No results found. I have personally reviewed and evaluated these images and lab results as part of my medical decision-making.   EKG Interpretation None     10:15 PM pain is under control after treatment with intravenous opioids Spoke with Dr. Lorin Mercy who will see patient tomorrow. Results for orders placed or performed during the hospital encounter of 0000000  Basic metabolic panel  Result Value Ref Range   Sodium 141 135 - 145 mmol/L   Potassium 4.1 3.5 - 5.1 mmol/L   Chloride 105 101 - 111 mmol/L   CO2 28 22 - 32 mmol/L   Glucose, Bld 99 65 - 99 mg/dL   BUN 14 6 - 20 mg/dL   Creatinine, Ser 0.71 0.44 - 1.00 mg/dL   Calcium 10.5 (H) 8.9 - 10.3 mg/dL   GFR calc non Af Amer >60 >60 mL/min   GFR calc Af Amer >60 >60 mL/min   Anion gap 8 5 - 15  CBC with Differential/Platelet  Result Value Ref Range   WBC 13.7 (H) 4.0 - 10.5 K/uL   RBC 3.92 3.87 - 5.11 MIL/uL    Hemoglobin 12.5 12.0 - 15.0 g/dL   HCT 38.8 36.0 - 46.0 %   MCV 99.0 78.0 - 100.0 fL   MCH 31.9 26.0 - 34.0 pg   MCHC 32.2 30.0 - 36.0 g/dL   RDW 12.1 11.5 - 15.5 %   Platelets 234 150 - 400 K/uL   Neutrophils Relative % 78 %   Neutro Abs 10.6 (H) 1.7 - 7.7 K/uL   Lymphocytes Relative 17 %   Lymphs Abs 2.3 0.7 - 4.0 K/uL   Monocytes Relative 5 %   Monocytes Absolute 0.7 0.1 - 1.0 K/uL   Eosinophils Relative 0 %   Eosinophils Absolute 0.0 0.0 - 0.7 K/uL   Basophils Relative 0 %   Basophils Absolute 0.0 0.0 - 0.1 K/uL  Type and screen  Result Value  Ref Range   ABO/RH(D) A POS    Antibody Screen NEG    Sample Expiration 06/28/2015   ABO/Rh  Result Value Ref Range   ABO/RH(D) A POS    Ct Pelvis Wo Contrast  06/25/2015  CLINICAL DATA:  Fall in bathroom. Left-sided sacral and pelvic pain. EXAM: CT PELVIS WITHOUT CONTRAST TECHNIQUE: Multidetector CT imaging of the pelvis was performed following the standard protocol without intravenous contrast. COMPARISON:  None. FINDINGS: Comminuted intertrochanteric left hip fracture is seen. No evidence of dislocation. A nondisplaced fracture of the left superior pubic ramus is also demonstrated. No other pelvic fractures identified. No evidence of pelvic joint diastasis. No evidence of intrapelvic mass, hematoma, or free fluid. Prior hysterectomy noted. Sigmoid diverticulosis is demonstrated, without evidence of diverticulitis. Multiple small calculi incidentally noted with the urinary bladder. IMPRESSION: Comminuted intertrochanteric left hip fracture. Nondisplaced fracture of left superior pubic ramus. Electronically Signed   By: Earle Gell M.D.   On: 06/25/2015 18:44    MDM  Spoke with Dr. Alcario Drought who will make recommendations as far as when patient have surgery. She last took Xarelto at bedtime yesterday. Plan admit medical surgical floor  Dx #1 left intertrochanteric closed hip fracture #2 pubic ramus fracture #3 fall Final diagnoses:  None         Orlie Dakin, MD 06/25/15 2217

## 2015-06-25 NOTE — ED Notes (Signed)
Patient returned from CT

## 2015-06-25 NOTE — ED Notes (Signed)
Received pt from home with c/o while walking back from the bathroom pt thinks her leg gave way and she fell next to her wheelchair. Pt c/o left hip pain. Upon arrival of EMS family has already assisted pt up off the floor. Upon transferring pt to EMS stretcher her left leg rotated outward. Pt given total of 10 Mg of Morphine by EMS.

## 2015-06-25 NOTE — H&P (Signed)
[facility] History and Physical  KAINAT KNUPPEL [id number] DOB: March 13, 1941 DOA: 06/25/2015  Referring physician: EDP PCP: Chevis Pretty, FNP   Chief Complaint: Fall   HPI: Elizabeth Drake is a 74 y.o. female with h/o sarcoidosis on chronic prednisone for a long time "most of my life", patient was attempting to ambulate back to wheelchair from bathroom (dosent ambulate well or much at all at baseline) when she fell.  Pt c/o left hip pain, worse with movement.  There is deformity on arrival to the ED.  Required 10mg  morphine by EMS en route to ED.  Review of Systems: Systems reviewed.  As above, otherwise negative  Past Medical History  Diagnosis Date  . Sarcoidosis ([place])   . Hiatal hernia   . Diabetes mellitus without complication ([place])   . Osteoporosis   . DVT (deep venous thrombosis) ([place])   . Pulmonary emboli [facility])    Past Surgical History  Procedure Laterality Date  . Abdominal hysterectomy    . Joint replacement      toe  . Lung lobectomy Left     Lower  . I&d extremity Left 09/13/2014    Procedure: IRRIGATION AND DEBRIDEMENT EXTREMITY;  Surgeon: Linna Hoff, MD;  Location: [place];  Service: Orthopedics;  Laterality: Left;   Social History:  reports that she quit smoking about 14 years ago. Her smoking use included Cigarettes. She does not have any smokeless tobacco history on file. She reports that she does not drink alcohol or use illicit drugs.  Allergies  Allergen Reactions  . Advil [Ibuprofen] Shortness Of Breath and Swelling  . Propoxyphene Swelling  . Azo [Phenazopyridine] Other (See Comments)    Unknown reaction  . Indocin [Indomethacin] Other (See Comments)    Unknown reaction  . Keflex [Cephalexin] Other (See Comments)  . Loratadine Other (See Comments)    unknown  . Percocet [Oxycodone-Acetaminophen] Itching    Family History  Problem Relation Age of Onset  . Heart disease Mother   . Hypertension Mother   . Cancer Father    pancratic  . Cancer - Other Sister      Prior to Admission medications   Medication Sig Start Date End Date Taking? Authorizing Provider  calcium-vitamin D (OSCAL 500/200 D-3) 500-200 MG-UNIT per tablet Take 1 tablet by mouth daily with breakfast.   Yes Historical Provider, MD  docusate sodium (COLACE) 100 MG capsule Take 1 capsule (100 mg total) by mouth 2 (two) times daily. Patient taking differently: Take 100 mg by mouth daily as needed for moderate constipation.  09/16/14  Yes Maryann Mikhail, DO  glimepiride (AMARYL) 2 MG tablet Take 1 tablet (2 mg total) by mouth daily. 04/04/15  Yes Mary-Margaret Hassell Done, FNP  HYDROcodone-acetaminophen (NORCO/VICODIN) 5-325 MG tablet Take one tablet by mouth every 4 hours as needed for moderate pain. Do not exceed 4gm of Tylenol in 24 hours 05/23/15  Yes Mary-Margaret Hassell Done, FNP  metFORMIN (GLUCOPHAGE) 500 MG tablet Take 1 tablet (500 mg total) by mouth daily. 04/04/15  Yes Mary-Margaret Hassell Done, FNP  metoprolol tartrate (LOPRESSOR) 25 MG tablet Take 1 tablet (25 mg total) by mouth 2 (two) times daily. 04/04/15  Yes Mary-Margaret Hassell Done, FNP  predniSONE (DELTASONE) 5 MG tablet TAKE ONE TABLET BY MOUTH DAILY WITH BREAKFAST 05/03/15  Yes Claretta Fraise, MD  rivaroxaban (XARELTO) 20 MG TABS tablet TAKE ONE TABLET BY MOUTH ONE TIME DAILY WITH SUPPER 04/04/15  Yes Mary-Margaret Hassell Done, FNP  sulfamethoxazole-trimethoprim (BACTRIM DS) 800-160 MG tablet Take 1 tablet  by mouth 2 (two) times daily. Patient taking differently: Take 1 tablet by mouth 2 (two) times daily. Started 06/21/15, for 7 days ending 06/27/15 05/09/15  Yes Mary-Margaret Hassell Done, FNP  feeding supplement, ENSURE, (ENSURE) PUDG Take 1 Container by mouth 2 (two) times daily between meals. 12/17/14   Mary-Margaret Hassell Done, FNP  mometasone (NASONEX) 50 MCG/ACT nasal spray 2 sprays per nostril qd Patient taking differently: Place 2 sprays into the nose daily as needed (allergies). 2 sprays per nostril qd 12/21/13    Lysbeth Penner, FNP   Physical Exam: Filed Vitals:   06/25/15 2200  BP: 140/63  Pulse: 100  Temp:   Resp: 18    BP 140/63 mmHg  Pulse 100  Temp(Src) 98.1 F (36.7 C)  Resp 18  Ht 5\' 6"  (1.676 m)  Wt 49.896 kg (110 lb)  BMI 17.76 kg/m2  SpO2 95%  General Appearance:    Alert, oriented, no distress, appears stated age  Head:    Normocephalic, atraumatic  Eyes:    PERRL, EOMI, sclera non-icteric        Nose:   Nares without drainage or epistaxis. Mucosa, turbinates normal  Throat:   Moist mucous membranes. Oropharynx without erythema or exudate.  Neck:   Supple. No carotid bruits.  No thyromegaly.  No lymphadenopathy.   Back:     No CVA tenderness, no spinal tenderness  Lungs:     Clear to auscultation bilaterally, without wheezes, rhonchi or rales  Chest wall:    No tenderness to palpitation  Heart:    Regular rate and rhythm without murmurs, gallops, rubs  Abdomen:     Soft, non-tender, nondistended, normal bowel sounds, no organomegaly  Genitalia:    deferred  Rectal:    deferred  Extremities:   No clubbing, cyanosis or edema.  Pulses:   2+ and symmetric all extremities  Skin:   Skin color, texture, turgor normal, no rashes or lesions  Lymph nodes:   Cervical, supraclavicular, and axillary nodes normal  Neurologic:   CNII-XII intact. Normal strength, sensation and reflexes      throughout    Labs on Admission:  Basic Metabolic Panel:  Recent Labs Lab 06/25/15 1951  NA 141  K 4.1  CL 105  CO2 28  GLUCOSE 99  BUN 14  CREATININE 0.71  CALCIUM 10.5*   Liver Function Tests: No results for input(s): AST, ALT, ALKPHOS, BILITOT, PROT, ALBUMIN in the last 168 hours. No results for input(s): LIPASE, AMYLASE in the last 168 hours. No results for input(s): AMMONIA in the last 168 hours. CBC:  Recent Labs Lab 06/25/15 2040  WBC 13.7*  NEUTROABS 10.6*  HGB 12.5  HCT 38.8  MCV 99.0  PLT 234   Cardiac Enzymes: No results for input(s): CKTOTAL, CKMB,  CKMBINDEX, TROPONINI in the last 168 hours.  BNP (last 3 results) No results for input(s): PROBNP in the last 8760 hours. CBG: No results for input(s): GLUCAP in the last 168 hours.  Radiological Exams on Admission: Ct Pelvis Wo Contrast  06/25/2015  CLINICAL DATA:  Fall in bathroom. Left-sided sacral and pelvic pain. EXAM: CT PELVIS WITHOUT CONTRAST TECHNIQUE: Multidetector CT imaging of the pelvis was performed following the standard protocol without intravenous contrast. COMPARISON:  None. FINDINGS: Comminuted intertrochanteric left hip fracture is seen. No evidence of dislocation. A nondisplaced fracture of the left superior pubic ramus is also demonstrated. No other pelvic fractures identified. No evidence of pelvic joint diastasis. No evidence of intrapelvic mass, hematoma, or free  fluid. Prior hysterectomy noted. Sigmoid diverticulosis is demonstrated, without evidence of diverticulitis. Multiple small calculi incidentally noted with the urinary bladder. IMPRESSION: Comminuted intertrochanteric left hip fracture. Nondisplaced fracture of left superior pubic ramus. Electronically Signed   By: Earle Gell M.D.   On: 06/25/2015 18:44    EKG: Independently reviewed.  Assessment/Plan Principal Problem:   Closed left hip fracture (HCC) Active Problems:   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   DM type 2 (diabetes mellitus, type 2) ([place])   Wheelchair bound   History of pulmonary embolism   1. Closed left hip Fx - 1. Hip Fx pathway 2. NPO after midnight 3. Is on [name], takes this in the evenings, last dose was evening of 06/24/15.  Per pharmacy needs to be off this for 24 hours before surgery (in general all the data I am aware of agrees with this for a standard bleeding risk type surgery for a patient with >30 mL/min creat clearance), so should be okay to go to surgery tomorrow as she will be over 36 hrs out by tomorrow morning. 1. Will go ahead and check INR tomorrow AM as well. 2. Will  order just SCDs for DVT ppx for now and not order the usual ASA pre-op due to above 4. Morphine PRN pain due to patient taking chronic pain meds and requiring high dose to control pain before getting to ED. 2. DM2 - 1. Sensitive scale SSI q4h 3. Sarcoidosis - continue chronic steroids    Code Status: Full Code  Family Communication: No family in room Disposition Plan: Admit to inpatient   Time spent: 70 min  GARDNER, JARED M. [facility] Pager [phone]  If 7AM-7PM, please contact the day team taking care of the patient [email or web address] Password [email or web address] 06/25/2015, 10:32 PM

## 2015-06-26 DIAGNOSIS — Z01818 Encounter for other preprocedural examination: Secondary | ICD-10-CM

## 2015-06-26 DIAGNOSIS — Z01810 Encounter for preprocedural cardiovascular examination: Secondary | ICD-10-CM

## 2015-06-26 DIAGNOSIS — I480 Paroxysmal atrial fibrillation: Secondary | ICD-10-CM

## 2015-06-26 LAB — ABO/RH: ABO/RH(D): A POS

## 2015-06-26 LAB — GLUCOSE, CAPILLARY
GLUCOSE-CAPILLARY: 151 mg/dL — AB (ref 65–99)
GLUCOSE-CAPILLARY: 187 mg/dL — AB (ref 65–99)
Glucose-Capillary: 117 mg/dL — ABNORMAL HIGH (ref 65–99)
Glucose-Capillary: 115 mg/dL — ABNORMAL HIGH (ref 65–99)
Glucose-Capillary: 196 mg/dL — ABNORMAL HIGH (ref 65–99)

## 2015-06-26 LAB — SURGICAL PCR SCREEN
MRSA, PCR: NEGATIVE
Staphylococcus aureus: NEGATIVE

## 2015-06-26 LAB — PROTIME-INR
INR: 1.43 (ref 0.00–1.49)
Prothrombin Time: 17.6 seconds — ABNORMAL HIGH (ref 11.6–15.2)

## 2015-06-26 MED ORDER — VANCOMYCIN HCL IN DEXTROSE 1-5 GM/200ML-% IV SOLN
1000.0000 mg | Freq: Once | INTRAVENOUS | Status: AC
  Administered 2015-06-27: 1000 mg via INTRAVENOUS
  Filled 2015-06-26 (×2): qty 200

## 2015-06-26 MED ORDER — HYDROCORTISONE NA SUCCINATE PF 100 MG IJ SOLR
50.0000 mg | Freq: Three times a day (TID) | INTRAMUSCULAR | Status: DC
  Administered 2015-06-26 (×2): 50 mg via INTRAVENOUS
  Administered 2015-06-27: 100 mg via INTRAVENOUS
  Administered 2015-06-28: 50 mg via INTRAVENOUS
  Filled 2015-06-26 (×10): qty 1

## 2015-06-26 MED ORDER — METOPROLOL TARTRATE 25 MG PO TABS
25.0000 mg | ORAL_TABLET | Freq: Two times a day (BID) | ORAL | Status: DC
Start: 2015-06-26 — End: 2015-06-30
  Administered 2015-06-26 – 2015-06-30 (×8): 25 mg via ORAL
  Filled 2015-06-26 (×8): qty 1

## 2015-06-26 MED ORDER — INFLUENZA VAC SPLIT QUAD 0.5 ML IM SUSY
0.5000 mL | PREFILLED_SYRINGE | INTRAMUSCULAR | Status: AC
  Administered 2015-06-28: 0.5 mL via INTRAMUSCULAR
  Filled 2015-06-26: qty 0.5

## 2015-06-26 NOTE — Progress Notes (Signed)
Full Note to follow.  EKG reviewed: Reveals LBBB which was also seen in Feb 2016.   She had an episode of Afib with demand ischemia in setting of cellulitis at that time. She was seen by cardiology who did not think she needed an ischemic work up. She did have an ECHO which revealed normal EF.   She has low functional capacity at baseline.   She will benefit from being seen by cardiology preoperatively to see if she needs ischemic work up prior to her surgery.  Emmanuell Kantz 06/26/2015 8:52 AM

## 2015-06-26 NOTE — Progress Notes (Signed)
Utilization review completed.  

## 2015-06-26 NOTE — Progress Notes (Signed)
[facility] PROGRESS NOTE  Elizabeth Drake [id number] DOB: 1940-11-29 DOA: 06/25/2015  PCP: Chevis Pretty, FNP  Brief HPI: 74 year old Caucasian female with a past medical history of sarcoidosis on chronic prednisone, paroxysmal atrial fibrillation in February 2016, type 2 diabetes, history of an embolism on anticoagulation, recent right leg cellulitis for which she was placed on Bactrim presented after what appears to be a mechanical fall resulting in left hip fracture. There was also a nondisplaced fracture of the left superior pubic ramus. Patient was hospitalized for further management.  Past medical history:  Past Medical History  Diagnosis Date  . Sarcoidosis ([place])   . Hiatal hernia   . Diabetes mellitus without complication ([place])   . Osteoporosis   . DVT (deep venous thrombosis) ([place])   . Pulmonary emboli [facility])     Consultants: Orthopedics  Procedures: None yet  Antibiotics: Patient has been continued on her Bactrim. Course will finish tomorrow.  Subjective: Patient denies any pain while lying still. Denies any chest pain, shortness of breath. At home she is wheelchair-bound and has very low functional opacity.  Objective: Vital Signs  Filed Vitals:   06/25/15 2200 06/25/15 2300 06/25/15 2355 06/26/15 0424  BP: 140/63 119/60 133/55 114/54  Pulse: 100 97 101 99  Temp:   99.8 F (37.7 C) 98.2 F (36.8 C)  TempSrc:   Oral   Resp: 18 22 17 18   Height:      Weight:      SpO2: 95% 90% 95% 96%    Intake/Output Summary (Last 24 hours) at 06/26/15 0758 Last data filed at 06/25/15 2151  Gross per 24 hour  Intake      0 ml  Output    350 ml  Net   -350 ml   Filed Weights   06/25/15 1718  Weight: 49.896 kg (110 lb)    General appearance: alert, cooperative, appears stated age and no distress Resp: clear to auscultation bilaterally Cardio: regular rate and rhythm, S1, S2 normal, no murmur, click, rub or gallop GI: soft, non-tender; bowel  sounds normal; no masses,  no organomegaly Extremities: Left lower extremity is externally rotated Neurologic: Awake and alert. Oriented 3. No focal neurological deficits noted.  Lab Results:  Basic Metabolic Panel:  Recent Labs Lab 06/25/15 1951  NA 141  K 4.1  CL 105  CO2 28  GLUCOSE 99  BUN 14  CREATININE 0.71  CALCIUM 10.5*   CBC:  Recent Labs Lab 06/25/15 2040  WBC 13.7*  NEUTROABS 10.6*  HGB 12.5  HCT 38.8  MCV 99.0  PLT 234   CBG:  Recent Labs Lab 06/25/15 2352 06/26/15 0421  GLUCAP 144* 151*    Recent Results (from the past 240 hour(s))  Surgical pcr screen     Status: None   Collection Time: 06/26/15 12:24 AM  Result Value Ref Range Status   MRSA, PCR NEGATIVE NEGATIVE Final   Staphylococcus aureus NEGATIVE NEGATIVE Final    Comment:        The Xpert SA Assay (FDA approved for NASAL specimens in patients over 75 years of age), is one component of a comprehensive surveillance program.  Test performance has been validated by [facility] for patients greater than or equal to 35 year old. It is not intended to diagnose infection nor to guide or monitor treatment.       Studies/Results: Ct Pelvis Wo Contrast  06/25/2015  CLINICAL DATA:  Fall in bathroom. Left-sided sacral and pelvic pain. EXAM: CT  PELVIS WITHOUT CONTRAST TECHNIQUE: Multidetector CT imaging of the pelvis was performed following the standard protocol without intravenous contrast. COMPARISON:  None. FINDINGS: Comminuted intertrochanteric left hip fracture is seen. No evidence of dislocation. A nondisplaced fracture of the left superior pubic ramus is also demonstrated. No other pelvic fractures identified. No evidence of pelvic joint diastasis. No evidence of intrapelvic mass, hematoma, or free fluid. Prior hysterectomy noted. Sigmoid diverticulosis is demonstrated, without evidence of diverticulitis. Multiple small calculi incidentally noted with the urinary bladder. IMPRESSION:  Comminuted intertrochanteric left hip fracture. Nondisplaced fracture of left superior pubic ramus. Electronically Signed   By: Earle Gell M.D.   On: 06/25/2015 18:44    Medications:  Scheduled: . hydrocortisone sod succinate (SOLU-CORTEF) inj  50 mg Intravenous Q8H  . [START ON 06/27/2015] Influenza vac split quadrivalent PF  0.5 mL Intramuscular Tomorrow-1000  . insulin aspart  0-9 Units Subcutaneous 6 times per day  . metoprolol tartrate  25 mg Oral BID  . sulfamethoxazole-trimethoprim  1 tablet Oral BID   Continuous:  [id number] sodium, morphine injection  Assessment/Plan:  Principal Problem:   Closed left hip fracture (HCC) Active Problems:   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   DM type 2 (diabetes mellitus, type 2) (HCC)   Wheelchair bound   History of pulmonary embolism    Left hip fracture along with nondisplaced fracture of the left superior pubic ramus Patient will need operative intervention for the hip fracture. Orthopedics has been consulted. We await their input. Pain control.  Preoperative evaluation Patient's medical history was reviewed. EKG reviewed: Reveals LBBB which was also seen in Feb 2016. She had an episode of Afib with demand ischemia in setting of cellulitis at that time. She was seen by cardiology at that time who did not think she needed an ischemic work up. She did have an ECHO which revealed normal EF. She has low functional capacity at baseline. She will benefit from being seen by cardiology preoperatively to see if she needs ischemic work up prior to her surgery. Discussed with Dr. Stanford Breed who will consult on this patient. Xarelto was last taken on the evening of 11/18. Patient's renal function is normal. Xarelto has been held. Okay to proceed to surgery once cleared by cardiology.  History of paroxysmal atrial fibrillation This occurred in the setting of cellulitis earlier this year. Echocardiogram showed normal systolic function without wall motion  abnormalities. Patient is on anticoagulation, which has been held due to need for surgical intervention. Continue her beta blocker.  History of pulmonary embolism This was the original reason for her being on anticoagulation. Currently stable.  History of type 2 diabetes Oral agents are on hold. Continue sliding scale coverage. Anticipate some worsening in her glycemic control due to steroids.  History of sarcoidosis on chronic steroids She has been on oral prednisone for many, many years. Initiate low-dose stress dose steroids with hydrocortisone. Anticipate quick taper post operatively.  Recent right lower extremity cellulitis She was on oral Bactrim for same. She will complete the course of antibiotics tomorrow. No evidence for active infection in the right leg.  DVT Prophylaxis: SCDs    Code Status: Full code  Family Communication: Discussed with the patient and her sister-in-law  Disposition Plan: Await orthopedic and cardiology input.     LOS: 1 day   [facility] Pager [phone] 06/26/2015, 7:58 AM  If 7PM-7AM, please contact night-coverage at [email or web address], password [email or web address]

## 2015-06-26 NOTE — Consult Note (Addendum)
CONSULT NOTE  Date: 06/26/2015               Patient Name:  Elizabeth Drake MRN: [id number]  DOB: 1940-09-02 Age / Sex: 74 y.o., female        PCP: Chevis Pretty Primary Cardiologist: Marlou Porch            Referring Physician: [place] Pink              Reason for Consult: Pre op eval prior to hip surgery            History of Present Illness: Patient is a 74 y.o. female with a PMHx of DVT, paroxysmal atrial fib  PE, sarcoidosis(on chronic prednisone), DM, who was admitted to [facility] on 06/25/2015 for evaluation of hip fracture.  We were asked to see her for pre-op evaluation . Marland Kitchen   She was admitted in Feb. 2016 with sepsis syndrome and developed paroxysmal atrial fib.   Troponin was unremarkable at that time ( 0.04)   She had a mechanical fall and sustained a fracture to her left hip   Medications: Outpatient medications: Prescriptions prior to admission  Medication Sig Dispense Refill Last Dose  . calcium-vitamin D (OSCAL 500/200 D-3) 500-200 MG-UNIT per tablet Take 1 tablet by mouth daily with breakfast.   06/25/2015 at Unknown time  . docusate sodium (COLACE) 100 MG capsule Take 1 capsule (100 mg total) by mouth 2 (two) times daily. (Patient taking differently: Take 100 mg by mouth daily as needed for moderate constipation. ) 10 capsule 0 Past Month at Unknown time  . glimepiride (AMARYL) 2 MG tablet Take 1 tablet (2 mg total) by mouth daily. 30 tablet 5 06/25/2015 at Unknown time  . HYDROcodone-acetaminophen (NORCO/VICODIN) 5-325 MG tablet Take one tablet by mouth every 4 hours as needed for moderate pain. Do not exceed 4gm of Tylenol in 24 hours 180 tablet 0 06/25/2015 at Unknown time  . metFORMIN (GLUCOPHAGE) 500 MG tablet Take 1 tablet (500 mg total) by mouth daily. 30 tablet 5 06/25/2015 at Unknown time  . metoprolol tartrate (LOPRESSOR) 25 MG tablet Take 1 tablet (25 mg total) by mouth 2 (two) times daily. 60 tablet 3 06/25/2015 at 0930  . predniSONE (DELTASONE) 5 MG  tablet TAKE ONE TABLET BY MOUTH DAILY WITH BREAKFAST 30 tablet 2 06/25/2015 at Unknown time  . rivaroxaban (XARELTO) 20 MG TABS tablet TAKE ONE TABLET BY MOUTH ONE TIME DAILY WITH SUPPER 30 tablet 5 06/24/2015 at Unknown time  . sulfamethoxazole-trimethoprim (BACTRIM DS) 800-160 MG tablet Take 1 tablet by mouth 2 (two) times daily. (Patient taking differently: Take 1 tablet by mouth 2 (two) times daily. Started 06/21/15, for 7 days ending 06/27/15) 14 tablet 0 06/25/2015 at Unknown time  . feeding supplement, ENSURE, (ENSURE) PUDG Take 1 Container by mouth 2 (two) times daily between meals.  0 Taking  . mometasone (NASONEX) 50 MCG/ACT nasal spray 2 sprays per nostril qd (Patient taking differently: Place 2 sprays into the nose daily as needed (allergies). 2 sprays per nostril qd) 17 g 12 Taking    Current medications: Current Facility-Administered Medications  Medication Dose Route Frequency Provider Last Rate Last Dose  . docusate sodium (COLACE) capsule 100 mg  100 mg Oral Daily PRN Etta Quill, DO      . hydrocortisone sodium succinate (SOLU-CORTEF) 100 MG injection 50 mg  50 mg Intravenous Q8H Bonnielee Haff, MD   50 mg at 06/26/15 0846  . [START ON 06/27/2015]  Influenza vac split quadrivalent PF (FLUARIX) injection 0.5 mL  0.5 mL Intramuscular Tomorrow-1000 Marybelle Killings, MD      . insulin aspart (novoLOG) injection 0-9 Units  0-9 Units Subcutaneous 6 times per day Etta Quill, DO   2 Units at 06/26/15 0442  . metoprolol tartrate (LOPRESSOR) tablet 25 mg  25 mg Oral BID Bonnielee Haff, MD   25 mg at 06/26/15 1011  . morphine 4 MG/ML injection 4 mg  4 mg Intravenous Q2H PRN Etta Quill, DO   4 mg at 06/26/15 1011  . sulfamethoxazole-trimethoprim (BACTRIM DS,SEPTRA DS) 800-160 MG per tablet 1 tablet  1 tablet Oral BID Bonnielee Haff, MD   1 tablet at 06/26/15 1011     Allergies  Allergen Reactions  . Advil [Ibuprofen] Shortness Of Breath and Swelling  . Propoxyphene Swelling  .  Azo [Phenazopyridine] Other (See Comments)    Unknown reaction  . Indocin [Indomethacin] Other (See Comments)    Unknown reaction  . Keflex [Cephalexin] Other (See Comments)  . Loratadine Other (See Comments)    unknown  . Percocet [Oxycodone-Acetaminophen] Itching     Past Medical History  Diagnosis Date  . Sarcoidosis ([place])   . Hiatal hernia   . Diabetes mellitus without complication ([place])   . Osteoporosis   . DVT (deep venous thrombosis) ([place])   . Pulmonary emboli [facility])     Past Surgical History  Procedure Laterality Date  . Abdominal hysterectomy    . Joint replacement      toe  . Lung lobectomy Left     Lower  . I&d extremity Left 09/13/2014    Procedure: IRRIGATION AND DEBRIDEMENT EXTREMITY;  Surgeon: Linna Hoff, MD;  Location: [place];  Service: Orthopedics;  Laterality: Left;    Family History  Problem Relation Age of Onset  . Heart disease Mother   . Hypertension Mother   . Cancer Father     pancratic  . Cancer - Other Sister     Social History:  reports that she quit smoking about 14 years ago. Her smoking use included Cigarettes. She does not have any smokeless tobacco history on file. She reports that she does not drink alcohol or use illicit drugs.   Review of Systems: Constitutional:  denies fever, chills, diaphoresis, appetite change and fatigue.  HEENT: denies photophobia, eye pain, redness, hearing loss, ear pain, congestion, sore throat, rhinorrhea, sneezing, neck pain, neck stiffness and tinnitus.  Respiratory: denies SOB, DOE, cough, chest tightness, and wheezing.  Cardiovascular: denies chest pain, palpitations and leg swelling.  Gastrointestinal: denies nausea, vomiting, abdominal pain, diarrhea, constipation, blood in stool.  Genitourinary: denies dysuria, urgency, frequency, hematuria, flank pain and difficulty urinating.  Musculoskeletal: denies  myalgias, back pain, joint swelling, arthralgias and gait problem.   Skin: denies pallor, rash  and wound.  Neurological: denies dizziness, seizures, syncope, weakness, light-headedness, numbness and headaches.   Hematological: denies adenopathy, easy bruising, personal or family bleeding history.  Psychiatric/ Behavioral: denies suicidal ideation, mood changes, confusion, nervousness, sleep disturbance and agitation.    Physical Exam: BP 114/54 mmHg  Pulse 99  Temp(Src) 98.2 F (36.8 C) (Oral)  Resp 18  Ht 5\' 6"  (1.676 m)  Wt 110 lb (49.896 kg)  BMI 17.76 kg/m2  SpO2 96%  Wt Readings from Last 3 Encounters:  06/25/15 110 lb (49.896 kg)  09/16/14 112 lb 7 oz (51 kg)  07/18/13 111 lb 15.9 oz (50.8 kg)    General: Vital signs reviewed and  noted. Well-developed, well-nourished, in no acute distress; alert,   Head: Normocephalic, atraumatic, sclera anicteric,   Neck: Supple. Negative for carotid bruits. No JVD   Lungs:  Clear bilaterally, no  wheezes, rales, or rhonchi. Breathing is normal   Heart: RRR with S1 S2. No murmurs, rubs, or gallops   Abdomen/ GI :  Soft, non-tender, non-distended with normoactive bowel sounds. No hepatomegaly. No rebound/guarding. No obvious abdominal masses   MSK: Left hip fractuer   Extremities: No clubbing or cyanosis. No edema.  Distal pedal pulses are 2+ and equal   Neurologic:  CN are grossly intact,  No obvious motor or sensory defect.  Alert and oriented X 3. Moves all extremities spontaneously.  Psych: Responds to questions appropriately with a normal affect.     Lab results: Basic Metabolic Panel:  Recent Labs Lab 06/25/15 1951  NA 141  K 4.1  CL 105  CO2 28  GLUCOSE 99  BUN 14  CREATININE 0.71  CALCIUM 10.5*    Liver Function Tests: No results for input(s): AST, ALT, ALKPHOS, BILITOT, PROT, ALBUMIN in the last 168 hours. No results for input(s): LIPASE, AMYLASE in the last 168 hours. No results for input(s): AMMONIA in the last 168 hours.  CBC:  Recent Labs Lab 06/25/15 2040  WBC 13.7*  NEUTROABS 10.6*  HGB 12.5    HCT 38.8  MCV 99.0  PLT 234    Cardiac Enzymes: No results for input(s): CKTOTAL, CKMB, CKMBINDEX, TROPONINI in the last 168 hours.  BNP: Invalid input(s): POCBNP  CBG:  Recent Labs Lab 06/25/15 2352 06/26/15 0421 06/26/15 0819  GLUCAP 144* 151* 117*    Coagulation Studies:  Recent Labs  06/26/15 0225  LABPROT 17.6*  INR 1.43     Other results: Personal review of EKG shows :  NSR .  Inc. LBBB. No ST or T wave changes.  Ct Pelvis Wo Contrast  06/25/2015  CLINICAL DATA:  Fall in bathroom. Left-sided sacral and pelvic pain. EXAM: CT PELVIS WITHOUT CONTRAST TECHNIQUE: Multidetector CT imaging of the pelvis was performed following the standard protocol without intravenous contrast. COMPARISON:  None. FINDINGS: Comminuted intertrochanteric left hip fracture is seen. No evidence of dislocation. A nondisplaced fracture of the left superior pubic ramus is also demonstrated. No other pelvic fractures identified. No evidence of pelvic joint diastasis. No evidence of intrapelvic mass, hematoma, or free fluid. Prior hysterectomy noted. Sigmoid diverticulosis is demonstrated, without evidence of diverticulitis. Multiple small calculi incidentally noted with the urinary bladder. IMPRESSION: Comminuted intertrochanteric left hip fracture. Nondisplaced fracture of left superior pubic ramus. Electronically Signed   By: Earle Gell M.D.   On: 06/25/2015 18:44        Assessment & Plan:  1.  Paroxysmal atrial fib:  She has a hx of PAF associated with an episode of sepsis.   Has not had any additional episodes. Denies any chest pain or dyspnea. Uses a wheelchair due to leg weakness. She has normal LV function by echo in Feb. 2016. Her last dose of Xarelto was 2 days ago .  She is safe to go to surgery at any time.   2. Moderate Pulmonary HTN:   Likely related to age.    She is at low risk for CV complications with hip repair.    Thayer Headings, Brooke Bonito., MD, [facility] 06/26/2015, 12:38  PM Office - [phone] Pager [phone]

## 2015-06-26 NOTE — Consult Note (Signed)
Reason for Consult:left IT Hip fx.  [name] patient Referring Physician: Maryland Pink MD  Elizabeth Drake is an 74 y.o. female.  HPI: 74 yo with multiple medical problems, sarcoidosis, Hx DVT with PE , MRSA infections requiring surgery, with fall at home and left IT hip fx.   Past Medical History  Diagnosis Date  . Sarcoidosis ([place])   . Hiatal hernia   . Diabetes mellitus without complication ([place])   . Osteoporosis   . DVT (deep venous thrombosis) ([place])   . Pulmonary emboli [facility])     Past Surgical History  Procedure Laterality Date  . Abdominal hysterectomy    . Joint replacement      toe  . Lung lobectomy Left     Lower  . I&d extremity Left 09/13/2014    Procedure: IRRIGATION AND DEBRIDEMENT EXTREMITY;  Surgeon: Linna Hoff, MD;  Location: [place];  Service: Orthopedics;  Laterality: Left;    Family History  Problem Relation Age of Onset  . Heart disease Mother   . Hypertension Mother   . Cancer Father     pancratic  . Cancer - Other Sister     Social History:  reports that she quit smoking about 14 years ago. Her smoking use included Cigarettes. She does not have any smokeless tobacco history on file. She reports that she does not drink alcohol or use illicit drugs.  Allergies:  Allergies  Allergen Reactions  . Advil [Ibuprofen] Shortness Of Breath and Swelling  . Propoxyphene Swelling  . Azo [Phenazopyridine] Other (See Comments)    Unknown reaction  . Indocin [Indomethacin] Other (See Comments)    Unknown reaction  . Keflex [Cephalexin] Other (See Comments)  . Loratadine Other (See Comments)    unknown  . Percocet [Oxycodone-Acetaminophen] Itching    Medications: I have reviewed the patient's current medications.  Results for orders placed or performed during the hospital encounter of 06/25/15 (from the past 48 hour(s))  Basic metabolic panel     Status: Abnormal   Collection Time: 06/25/15  7:51 PM  Result Value Ref Range   Sodium 141 135 - 145 mmol/L    Potassium 4.1 3.5 - 5.1 mmol/L   Chloride 105 101 - 111 mmol/L   CO2 28 22 - 32 mmol/L   Glucose, Bld 99 65 - 99 mg/dL   BUN 14 6 - 20 mg/dL   Creatinine, Ser 0.71 0.44 - 1.00 mg/dL   Calcium 10.5 (H) 8.9 - 10.3 mg/dL   GFR calc non Af Amer >60 >60 mL/min   GFR calc Af Amer >60 >60 mL/min    Comment: (NOTE) The eGFR has been calculated using the CKD EPI equation. This calculation has not been validated in all clinical situations. eGFR's persistently <60 mL/min signify possible Chronic Kidney Disease.    Anion gap 8 5 - 15  Type and screen     Status: None   Collection Time: 06/25/15  8:40 PM  Result Value Ref Range   ABO/RH(D) A POS    Antibody Screen NEG    Sample Expiration 06/28/2015   CBC with Differential/Platelet     Status: Abnormal   Collection Time: 06/25/15  8:40 PM  Result Value Ref Range   WBC 13.7 (H) 4.0 - 10.5 K/uL   RBC 3.92 3.87 - 5.11 MIL/uL   Hemoglobin 12.5 12.0 - 15.0 g/dL   HCT 38.8 36.0 - 46.0 %   MCV 99.0 78.0 - 100.0 fL   MCH 31.9 26.0 - 34.0 pg  MCHC 32.2 30.0 - 36.0 g/dL   RDW 12.1 11.5 - 15.5 %   Platelets 234 150 - 400 K/uL   Neutrophils Relative % 78 %   Neutro Abs 10.6 (H) 1.7 - 7.7 K/uL   Lymphocytes Relative 17 %   Lymphs Abs 2.3 0.7 - 4.0 K/uL   Monocytes Relative 5 %   Monocytes Absolute 0.7 0.1 - 1.0 K/uL   Eosinophils Relative 0 %   Eosinophils Absolute 0.0 0.0 - 0.7 K/uL   Basophils Relative 0 %   Basophils Absolute 0.0 0.0 - 0.1 K/uL  ABO/Rh     Status: None   Collection Time: 06/25/15  8:40 PM  Result Value Ref Range   ABO/RH(D) A POS   Urinalysis, Routine w reflex microscopic (not at [facility])     Status: Abnormal   Collection Time: 06/25/15 10:12 PM  Result Value Ref Range   Color, Urine YELLOW YELLOW   APPearance TURBID (A) CLEAR   Specific Gravity, Urine 1.015 1.005 - 1.030   pH 8.5 (H) 5.0 - 8.0   Glucose, UA NEGATIVE NEGATIVE mg/dL   Hgb urine dipstick SMALL (A) NEGATIVE   Bilirubin Urine NEGATIVE NEGATIVE   Ketones,  ur NEGATIVE NEGATIVE mg/dL   Protein, ur NEGATIVE NEGATIVE mg/dL   Nitrite NEGATIVE NEGATIVE   Leukocytes, UA NEGATIVE NEGATIVE  Urine microscopic-add on     Status: Abnormal   Collection Time: 06/25/15 10:12 PM  Result Value Ref Range   Squamous Epithelial / LPF 0-5 (A) NONE SEEN    Comment: Please note change in reference range.   WBC, UA 0-5 0 - 5 WBC/hpf    Comment: Please note change in reference range.   RBC / HPF 0-5 0 - 5 RBC/hpf    Comment: Please note change in reference range.   Bacteria, UA MANY (A) NONE SEEN    Comment: Please note change in reference range.   Casts GRANULAR CAST (A) NEGATIVE   Crystals TRIPLE PHOSPHATE CRYSTALS (A) NEGATIVE  Glucose, capillary     Status: Abnormal   Collection Time: 06/25/15 11:52 PM  Result Value Ref Range   Glucose-Capillary 144 (H) 65 - 99 mg/dL  Surgical pcr screen     Status: None   Collection Time: 06/26/15 12:24 AM  Result Value Ref Range   MRSA, PCR NEGATIVE NEGATIVE   Staphylococcus aureus NEGATIVE NEGATIVE    Comment:        The Xpert SA Assay (FDA approved for NASAL specimens in patients over 46 years of age), is one component of a comprehensive surveillance program.  Test performance has been validated by [facility] for patients greater than or equal to 70 year old. It is not intended to diagnose infection nor to guide or monitor treatment.   Protime-INR     Status: Abnormal   Collection Time: 06/26/15  2:25 AM  Result Value Ref Range   Prothrombin Time 17.6 (H) 11.6 - 15.2 seconds   INR 1.43 0.00 - 1.49  Glucose, capillary     Status: Abnormal   Collection Time: 06/26/15  4:21 AM  Result Value Ref Range   Glucose-Capillary 151 (H) 65 - 99 mg/dL  Glucose, capillary     Status: Abnormal   Collection Time: 06/26/15  8:19 AM  Result Value Ref Range   Glucose-Capillary 117 (H) 65 - 99 mg/dL    Ct Pelvis Wo Contrast  06/25/2015  CLINICAL DATA:  Fall in bathroom. Left-sided sacral and pelvic pain. EXAM: CT  PELVIS  WITHOUT CONTRAST TECHNIQUE: Multidetector CT imaging of the pelvis was performed following the standard protocol without intravenous contrast. COMPARISON:  None. FINDINGS: Comminuted intertrochanteric left hip fracture is seen. No evidence of dislocation. A nondisplaced fracture of the left superior pubic ramus is also demonstrated. No other pelvic fractures identified. No evidence of pelvic joint diastasis. No evidence of intrapelvic mass, hematoma, or free fluid. Prior hysterectomy noted. Sigmoid diverticulosis is demonstrated, without evidence of diverticulitis. Multiple small calculi incidentally noted with the urinary bladder. IMPRESSION: Comminuted intertrochanteric left hip fracture. Nondisplaced fracture of left superior pubic ramus. Electronically Signed   By: Earle Gell M.D.   On: 06/25/2015 18:44    Review of Systems  Constitutional: Negative for fever and chills.  HENT: Negative for ear discharge.   Respiratory: Negative for cough.        Past smoker quit 14 yrs ago  Cardiovascular: Positive for orthopnea. Negative for claudication.  Gastrointestinal: Negative for vomiting and abdominal pain.  Musculoskeletal: Positive for myalgias, joint pain and falls.  Neurological: Positive for weakness. Negative for dizziness and headaches.  Psychiatric/Behavioral: Negative for substance abuse.   Blood pressure 114/54, pulse 99, temperature 98.2 F (36.8 C), temperature source Oral, resp. rate 18, height $RemoveBe'5\' 6"'tIeQoOjgU$  (1.676 m), weight 49.896 kg (110 lb), SpO2 96 %. Physical Exam  Constitutional: She is oriented to person, place, and time. She appears well-developed and well-nourished.  HENT:  Head: Normocephalic.  Neck: Normal range of motion. Neck supple. No thyromegaly present.  Cardiovascular: Normal rate.   Respiratory: Effort normal.  GI: Soft.  Musculoskeletal:  Pain with hip ROM left. Short and ER pulses intact  Neurological: She is alert and oriented to person, place, and time.   Skin: No rash noted. No erythema.  Psychiatric: She has a normal mood and affect. Her behavior is normal. Thought content normal.    Assessment/Plan: Left IT Hip Fx. Will feed today and do surgery tomorrow.  Literature not conclusive about waiting for surgery while on Xeralto for non life threating injuries. Will proceed with troch Nail tomorrow. Risks discussed. Will restart Xeralto post op. Procedure discussed all ?'s answered.   Frederich Montilla C 06/26/2015, 2:46 PM

## 2015-06-27 ENCOUNTER — Encounter (HOSPITAL_COMMUNITY)
Admission: EM | Disposition: A | Discharge status: Skilled Nursing Facility | Payer: Self-pay | Source: Home / Self Care | Attending: Internal Medicine

## 2015-06-27 ENCOUNTER — Inpatient Hospital Stay (HOSPITAL_COMMUNITY): Payer: Medicare Other | Admitting: Anesthesiology

## 2015-06-27 ENCOUNTER — Encounter (HOSPITAL_COMMUNITY): Payer: Self-pay | Admitting: Anesthesiology

## 2015-06-27 ENCOUNTER — Inpatient Hospital Stay (HOSPITAL_COMMUNITY): Payer: Medicare Other

## 2015-06-27 DIAGNOSIS — E1165 Type 2 diabetes mellitus with hyperglycemia: Secondary | ICD-10-CM

## 2015-06-27 HISTORY — PX: INTRAMEDULLARY (IM) NAIL INTERTROCHANTERIC: SHX5875

## 2015-06-27 LAB — CBC
HEMATOCRIT: 33.2 % — AB (ref 36.0–46.0)
HEMOGLOBIN: 10.4 g/dL — AB (ref 12.0–15.0)
MCH: 30.7 pg (ref 26.0–34.0)
MCHC: 31.3 g/dL (ref 30.0–36.0)
MCV: 97.9 fL (ref 78.0–100.0)
Platelets: 175 10*3/uL (ref 150–400)
RBC: 3.39 MIL/uL — ABNORMAL LOW (ref 3.87–5.11)
RDW: 12.2 % (ref 11.5–15.5)
WBC: 9.2 10*3/uL (ref 4.0–10.5)

## 2015-06-27 LAB — GLUCOSE, CAPILLARY
GLUCOSE-CAPILLARY: 103 mg/dL — AB (ref 65–99)
GLUCOSE-CAPILLARY: 121 mg/dL — AB (ref 65–99)
GLUCOSE-CAPILLARY: 158 mg/dL — AB (ref 65–99)
GLUCOSE-CAPILLARY: 159 mg/dL — AB (ref 65–99)
GLUCOSE-CAPILLARY: 211 mg/dL — AB (ref 65–99)
GLUCOSE-CAPILLARY: 211 mg/dL — AB (ref 65–99)
GLUCOSE-CAPILLARY: 67 mg/dL (ref 65–99)
Glucose-Capillary: 154 mg/dL — ABNORMAL HIGH (ref 65–99)

## 2015-06-27 LAB — BASIC METABOLIC PANEL
ANION GAP: 6 (ref 5–15)
BUN: 19 mg/dL (ref 6–20)
CHLORIDE: 103 mmol/L (ref 101–111)
CO2: 28 mmol/L (ref 22–32)
Calcium: 9.6 mg/dL (ref 8.9–10.3)
Creatinine, Ser: 0.96 mg/dL (ref 0.44–1.00)
GFR calc Af Amer: >60 mL/min (ref >60)
GFR calc non Af Amer: 56 mL/min — ABNORMAL LOW (ref >60)
Glucose, Bld: 141 mg/dL — ABNORMAL HIGH (ref 65–99)
POTASSIUM: 4.2 mmol/L (ref 3.5–5.1)
SODIUM: 137 mmol/L (ref 135–145)

## 2015-06-27 LAB — MRSA PCR SCREENING: MRSA BY PCR: NEGATIVE

## 2015-06-27 SURGERY — FIXATION, FRACTURE, INTERTROCHANTERIC, WITH INTRAMEDULLARY ROD
Anesthesia: General | Site: Hip | Laterality: Left

## 2015-06-27 MED ORDER — PHENYLEPHRINE 40 MCG/ML (10ML) SYRINGE FOR IV PUSH (FOR BLOOD PRESSURE SUPPORT)
PREFILLED_SYRINGE | INTRAVENOUS | Status: AC
  Filled 2015-06-27: qty 10

## 2015-06-27 MED ORDER — LACTATED RINGERS IV SOLN
INTRAVENOUS | Status: DC | PRN
  Administered 2015-06-27: 16:00:00 via INTRAVENOUS

## 2015-06-27 MED ORDER — ONDANSETRON HCL 4 MG PO TABS
4.0000 mg | ORAL_TABLET | Freq: Four times a day (QID) | ORAL | Status: DC | PRN
Start: 2015-06-27 — End: 2015-06-30

## 2015-06-27 MED ORDER — FENTANYL CITRATE (PF) 100 MCG/2ML IJ SOLN
INTRAMUSCULAR | Status: AC
  Filled 2015-06-27: qty 2

## 2015-06-27 MED ORDER — DEXAMETHASONE SODIUM PHOSPHATE 4 MG/ML IJ SOLN
INTRAMUSCULAR | Status: AC
  Filled 2015-06-27: qty 1

## 2015-06-27 MED ORDER — DOCUSATE SODIUM 100 MG PO CAPS
100.0000 mg | ORAL_CAPSULE | Freq: Two times a day (BID) | ORAL | Status: DC
Start: 2015-06-27 — End: 2015-06-29
  Administered 2015-06-27 – 2015-06-28 (×3): 100 mg via ORAL
  Filled 2015-06-27 (×3): qty 1

## 2015-06-27 MED ORDER — LACTATED RINGERS IV SOLN
Freq: Once | INTRAVENOUS | Status: AC
  Administered 2015-06-27: 15:00:00 via INTRAVENOUS

## 2015-06-27 MED ORDER — ONDANSETRON HCL 4 MG/2ML IJ SOLN: 4.0000 mg | Freq: Four times a day (QID) | INTRAMUSCULAR | Status: DC | PRN

## 2015-06-27 MED ORDER — METOCLOPRAMIDE HCL 5 MG/ML IJ SOLN: 5.0000 mg | Freq: Three times a day (TID) | INTRAMUSCULAR | Status: DC | PRN

## 2015-06-27 MED ORDER — LIDOCAINE HCL (CARDIAC) 20 MG/ML IV SOLN
INTRAVENOUS | Status: DC | PRN
  Administered 2015-06-27: 60 mg via INTRAVENOUS

## 2015-06-27 MED ORDER — POLYETHYLENE GLYCOL 3350 17 G PO PACK: 17.0000 g | PACK | Freq: Every day | ORAL | Status: DC | PRN

## 2015-06-27 MED ORDER — ONDANSETRON HCL 4 MG/2ML IJ SOLN
INTRAMUSCULAR | Status: AC
  Filled 2015-06-27: qty 2

## 2015-06-27 MED ORDER — RIVAROXABAN 20 MG PO TABS
20.0000 mg | ORAL_TABLET | Freq: Every day | ORAL | Status: DC
  Administered 2015-06-27 – 2015-06-29 (×3): 20 mg via ORAL
  Filled 2015-06-27 (×3): qty 1

## 2015-06-27 MED ORDER — PHENYLEPHRINE HCL 10 MG/ML IJ SOLN
10.0000 mg | INTRAVENOUS | Status: DC | PRN
  Administered 2015-06-27: 75 ug/min via INTRAVENOUS

## 2015-06-27 MED ORDER — METOCLOPRAMIDE HCL 5 MG PO TABS: 5.0000 mg | ORAL_TABLET | Freq: Three times a day (TID) | ORAL | Status: DC | PRN

## 2015-06-27 MED ORDER — FENTANYL CITRATE (PF) 100 MCG/2ML IJ SOLN
25.0000 ug | INTRAMUSCULAR | Status: DC | PRN
Start: 2015-06-27 — End: 2015-06-27
  Administered 2015-06-27 (×2): 25 ug via INTRAVENOUS

## 2015-06-27 MED ORDER — HYDROMORPHONE HCL 1 MG/ML IJ SOLN
0.5000 mg | INTRAMUSCULAR | Status: DC | PRN
  Administered 2015-06-27 – 2015-06-29 (×4): 0.5 mg via INTRAVENOUS
  Filled 2015-06-27 (×4): qty 1

## 2015-06-27 MED ORDER — GLYCOPYRROLATE 0.2 MG/ML IJ SOLN
INTRAMUSCULAR | Status: DC | PRN
  Administered 2015-06-27: 0.1 mg via INTRAVENOUS

## 2015-06-27 MED ORDER — PROPOFOL 10 MG/ML IV BOLUS
INTRAVENOUS | Status: DC | PRN
  Administered 2015-06-27: 100 mg via INTRAVENOUS

## 2015-06-27 MED ORDER — ONDANSETRON HCL 4 MG/2ML IJ SOLN
INTRAMUSCULAR | Status: DC | PRN
  Administered 2015-06-27: 4 mg via INTRAVENOUS

## 2015-06-27 MED ORDER — ACETAMINOPHEN 325 MG PO TABS: 650.0000 mg | ORAL_TABLET | Freq: Four times a day (QID) | ORAL | Status: DC | PRN

## 2015-06-27 MED ORDER — FENTANYL CITRATE (PF) 250 MCG/5ML IJ SOLN
INTRAMUSCULAR | Status: AC
  Filled 2015-06-27: qty 5

## 2015-06-27 MED ORDER — SUCCINYLCHOLINE CHLORIDE 20 MG/ML IJ SOLN
INTRAMUSCULAR | Status: DC | PRN
  Administered 2015-06-27: 100 mg via INTRAVENOUS

## 2015-06-27 MED ORDER — BUPIVACAINE-EPINEPHRINE (PF) 0.5% -1:200000 IJ SOLN
INTRAMUSCULAR | Status: AC
  Filled 2015-06-27: qty 30

## 2015-06-27 MED ORDER — 0.9 % SODIUM CHLORIDE (POUR BTL) OPTIME
TOPICAL | Status: DC | PRN
  Administered 2015-06-27: 1000 mL

## 2015-06-27 MED ORDER — LACTATED RINGERS IV SOLN: INTRAVENOUS | Status: DC

## 2015-06-27 MED ORDER — BOOST / RESOURCE BREEZE PO LIQD
1.0000 | Freq: Two times a day (BID) | ORAL | Status: DC
  Administered 2015-06-29 – 2015-06-30 (×3): 1 via ORAL

## 2015-06-27 MED ORDER — MENTHOL 3 MG MT LOZG: 1.0000 | LOZENGE | OROMUCOSAL | Status: DC | PRN

## 2015-06-27 MED ORDER — PHENYLEPHRINE HCL 10 MG/ML IJ SOLN
INTRAMUSCULAR | Status: DC | PRN
  Administered 2015-06-27: 80 ug via INTRAVENOUS
  Administered 2015-06-27: 160 ug via INTRAVENOUS
  Administered 2015-06-27: 120 ug via INTRAVENOUS

## 2015-06-27 MED ORDER — MEPERIDINE HCL 25 MG/ML IJ SOLN: 6.2500 mg | INTRAMUSCULAR | Status: DC | PRN

## 2015-06-27 MED ORDER — PROMETHAZINE HCL 25 MG/ML IJ SOLN: 6.2500 mg | INTRAMUSCULAR | Status: DC | PRN

## 2015-06-27 MED ORDER — ACETAMINOPHEN 650 MG RE SUPP: 650.0000 mg | Freq: Four times a day (QID) | RECTAL | Status: DC | PRN

## 2015-06-27 MED ORDER — PHENOL 1.4 % MT LIQD: 1.0000 | OROMUCOSAL | Status: DC | PRN

## 2015-06-27 MED ORDER — FENTANYL CITRATE (PF) 100 MCG/2ML IJ SOLN
INTRAMUSCULAR | Status: DC | PRN
  Administered 2015-06-27 (×2): 50 ug via INTRAVENOUS

## 2015-06-27 SURGICAL SUPPLY — 48 items
BIT DRILL 4.3MMS DISTAL GRDTED (BIT) IMPLANT
BNDG COHESIVE 4X5 TAN STRL (GAUZE/BANDAGES/DRESSINGS) ×3 IMPLANT
CANISTER SUCTION 2500CC (MISCELLANEOUS) ×3 IMPLANT
COVER MAYO STAND STRL (DRAPES) ×3 IMPLANT
COVER PERINEAL POST (MISCELLANEOUS) ×3 IMPLANT
COVER SURGICAL LIGHT HANDLE (MISCELLANEOUS) ×3 IMPLANT
DRAPE C-ARM 42X72 X-RAY (DRAPES) ×3 IMPLANT
DRAPE STERI IOBAN 125X83 (DRAPES) ×3 IMPLANT
DRILL 4.3MMS DISTAL GRADUATED (BIT) ×3
DRSG MEPILEX BORDER 4X4 (GAUZE/BANDAGES/DRESSINGS) ×4 IMPLANT
DRSG MEPILEX BORDER 4X8 (GAUZE/BANDAGES/DRESSINGS) ×2 IMPLANT
DRSG PAD ABDOMINAL 8X10 ST (GAUZE/BANDAGES/DRESSINGS) ×3 IMPLANT
DURAPREP 26ML APPLICATOR (WOUND CARE) ×3 IMPLANT
ELECT REM PT RETURN 9FT ADLT (ELECTROSURGICAL) ×3
ELECTRODE REM PT RTRN 9FT ADLT (ELECTROSURGICAL) ×1 IMPLANT
EVACUATOR 1/8 PVC DRAIN (DRAIN) IMPLANT
GAUZE SPONGE 4X4 12PLY STRL (GAUZE/BANDAGES/DRESSINGS) ×3 IMPLANT
GAUZE XEROFORM 1X8 LF (GAUZE/BANDAGES/DRESSINGS) ×3 IMPLANT
GLOVE BIO SURGEON STRL SZ7 (GLOVE) ×6 IMPLANT
GLOVE BIOGEL M 6.5 STRL (GLOVE) ×4 IMPLANT
GLOVE BIOGEL PI IND STRL 6.5 (GLOVE) ×1 IMPLANT
GLOVE BIOGEL PI IND STRL 8 (GLOVE) ×2 IMPLANT
GLOVE BIOGEL PI INDICATOR 6.5 (GLOVE) ×2
GLOVE BIOGEL PI INDICATOR 8 (GLOVE) ×4
GLOVE ORTHO TXT STRL SZ7.5 (GLOVE) ×6 IMPLANT
GOWN STRL REUS W/ TWL LRG LVL3 (GOWN DISPOSABLE) ×1 IMPLANT
GOWN STRL REUS W/TWL 2XL LVL3 (GOWN DISPOSABLE) ×3 IMPLANT
GOWN STRL REUS W/TWL LRG LVL3 (GOWN DISPOSABLE) ×12 IMPLANT
GUIDEPIN 3.2X17.5 THRD DISP (PIN) ×4 IMPLANT
GUIDEWIRE BALL NOSE 80CM (WIRE) ×2 IMPLANT
HFN LH 130 DEG 11MM X 380MM (Orthopedic Implant) ×2 IMPLANT
HIP FRA NAIL LAG SCREW 10.5X90 (Orthopedic Implant) ×3 IMPLANT
KIT BASIN OR (CUSTOM PROCEDURE TRAY) ×3 IMPLANT
KIT ROOM TURNOVER OR (KITS) ×3 IMPLANT
LINER BOOT UNIVERSAL DISP (MISCELLANEOUS) ×3 IMPLANT
MANIFOLD NEPTUNE II (INSTRUMENTS) ×3 IMPLANT
NS IRRIG 1000ML POUR BTL (IV SOLUTION) ×3 IMPLANT
PACK GENERAL/GYN (CUSTOM PROCEDURE TRAY) ×3 IMPLANT
PAD ARMBOARD 7.5X6 YLW CONV (MISCELLANEOUS) ×6 IMPLANT
SCREW BONE CORTICAL 5.0X40 (Screw) ×2 IMPLANT
SCREW LAG HIP FRA NAIL 10.5X90 (Orthopedic Implant) IMPLANT
SCREWDRIVER HEX TIP 3.5MM (MISCELLANEOUS) ×2 IMPLANT
STAPLER VISISTAT 35W (STAPLE) ×3 IMPLANT
SUT VIC AB 0 CT1 27 (SUTURE) ×3
SUT VIC AB 0 CT1 27XBRD ANBCTR (SUTURE) ×1 IMPLANT
SUT VIC AB 2-0 CT1 27 (SUTURE) ×6
SUT VIC AB 2-0 CT1 TAPERPNT 27 (SUTURE) ×2 IMPLANT
WATER STERILE IRR 1000ML POUR (IV SOLUTION) ×3 IMPLANT

## 2015-06-27 NOTE — Transfer of Care (Signed)
Immediate Anesthesia Transfer of Care Note  Patient: Elizabeth Drake  Procedure(s) Performed: Procedure(s): INTRAMEDULLARY (IM) NAIL INTERTROCHANTRIC AFFIXIS (Left)  Patient Location: PACU  Anesthesia Type:General  Level of Consciousness: awake, alert  and oriented  Airway & Oxygen Therapy: Patient Spontanous Breathing and Patient connected to nasal cannula oxygen  Post-op Assessment: Report given to RN and Post -op Vital signs reviewed and stable  Post vital signs: Reviewed and stable  Last Vitals:  Filed Vitals:   06/27/15 0809 06/27/15 1300  BP: 134/49 127/49  Pulse: 76 81  Temp: 36.4 C 36.8 C  Resp:  18    Complications: No apparent anesthesia complications

## 2015-06-27 NOTE — Brief Op Note (Signed)
06/25/2015 - 06/27/2015  7:12 PM  PATIENT:  Elizabeth Drake  74 y.o. female  PRE-OPERATIVE DIAGNOSIS:  left intertroch fx  POST-OPERATIVE DIAGNOSIS:  left intertroch fx  PROCEDURE:  Procedure(s): INTRAMEDULLARY (IM) NAIL INTERTROCHANTRIC AFFIXIS (Left)  SURGEON:  Surgeon(s) and Role:    * Marybelle Killings, MD - Primary  PHYSICIAN ASSISTANT:   ASSISTANTS: none   ANESTHESIA:   general  EBL:     BLOOD ADMINISTERED:none  DRAINS: none   LOCAL MEDICATIONS USED:  MARCAINE     SPECIMEN:  No Specimen  DISPOSITION OF SPECIMEN:  N/A  COUNTS:  YES  TOURNIQUET:  * No tourniquets in log *  DICTATION: .Other Dictation: Dictation Number [id number]  PLAN OF CARE: already inpt  PATIENT DISPOSITION:  PACU - hemodynamically stable.   Delay start of Pharmacological VTE agent (>24hrs) due to surgical blood loss or risk of bleeding: yes

## 2015-06-27 NOTE — Anesthesia Procedure Notes (Signed)
Procedure Name: Intubation Date/Time: 06/27/2015 4:23 PM Performed by: Susa Loffler Pre-anesthesia Checklist: Patient identified, Emergency Drugs available, Suction available, Patient being monitored and Timeout performed Patient Re-evaluated:Patient Re-evaluated prior to inductionOxygen Delivery Method: Circle system utilized Preoxygenation: Pre-oxygenation with 100% oxygen Intubation Type: IV induction Ventilation: Mask ventilation without difficulty Laryngoscope Size: Mac and 3 Grade View: Grade I Tube type: Oral Tube size: 7.0 mm Number of attempts: 1 Airway Equipment and Method: Stylet Placement Confirmation: ETT inserted through vocal cords under direct vision,  positive ETCO2 and breath sounds checked- equal and bilateral Secured at: 21 cm Tube secured with: Tape Dental Injury: Teeth and Oropharynx as per pre-operative assessment

## 2015-06-27 NOTE — Interval H&P Note (Signed)
History and Physical Interval Note:  06/27/2015 3:57 PM  Elizabeth Drake  has presented today for surgery, with the diagnosis of left intertroch fx  The various methods of treatment have been discussed with the patient and family. After consideration of risks, benefits and other options for treatment, the patient has consented to  Procedure(s): INTRAMEDULLARY (IM) NAIL INTERTROCHANTRIC AFFIXIS (Left) as a surgical intervention .  The patient's history has been reviewed, patient examined, no change in status, stable for surgery.  I have reviewed the patient's chart and labs.  Questions were answered to the patient's satisfaction.     YATES,MARK C

## 2015-06-27 NOTE — Progress Notes (Signed)
Initial Nutrition Assessment  DOCUMENTATION CODES:   Severe malnutrition in context of chronic illness, Underweight  INTERVENTION:  Once diet advances, provide Boost Breeze po BID, each supplement provides 250 kcal and 9 grams of protein.  NUTRITION DIAGNOSIS:   Malnutrition related to chronic illness as evidenced by severe depletion of body fat, severe depletion of muscle mass.  GOAL:   Patient will meet greater than or equal to 90% of their needs  MONITOR:   Diet advancement, Skin, Weight trends, Labs, I & O's  REASON FOR ASSESSMENT:   Consult Hip fracture protocol  ASSESSMENT:   74 year old Caucasian female with a past medical history of sarcoidosis on chronic prednisone, paroxysmal atrial fibrillation in February 2016, type 2 diabetes, history of an embolism on anticoagulation, recent right leg cellulitis for which she was placed on Bactrim presented after what appears to be a mechanical fall resulting in left hip fracture. There was also a nondisplaced fracture of the left superior pubic ramus.   Pt is currently NPO for surgery today. Pt reports appetite has been fine currently and PTA with consumption of 2-3 meals daily with no other difficulties. Weight has been stable. Noted, no recent meal completion recorded. Pt is agreeable to nutritional supplements to aid in healing. Pt does reports not liking Ensure due to texture/taste. RD to order Boost Breeze once diet advances.  Nutrition-Focused physical exam completed. Findings are severe fat depletion, severe muscle depletion, and no edema.   Labs and medications reviewed.   Diet Order:  Diet NPO time specified Except for: Sips with Meds  Skin:  Reviewed, no issues  Last BM:  11/19  Height:   Ht Readings from Last 1 Encounters:  06/25/15 5\' 6"  (1.676 m)    Weight:   Wt Readings from Last 1 Encounters:  06/25/15 110 lb (49.896 kg)    Ideal Body Weight:  59 kg  BMI:  Body mass index is 17.76  kg/(m^2).  Estimated Nutritional Needs:   Kcal:  1500-1800  Protein:  70-80 grams  Fluid:  >/= 1.5 L/day  EDUCATION NEEDS:   No education needs identified at this time  Corrin Parker, MS, RD, LDN Pager # [phone] After hours/ weekend pager # [phone]

## 2015-06-27 NOTE — Anesthesia Preprocedure Evaluation (Addendum)
Anesthesia Evaluation  Patient identified by MRN, date of birth, ID band Patient awake    Airway Mallampati: II  TM Distance: >3 FB Neck ROM: Full    Dental  (+) Teeth Intact   Pulmonary former smoker,    breath sounds clear to auscultation       Cardiovascular hypertension, Pt. on medications and Pt. on home beta blockers + DVT  + dysrhythmias Atrial Fibrillation  Rhythm:Regular Rate:Normal     Neuro/Psych  Neuromuscular disease negative psych ROS   GI/Hepatic   Endo/Other  diabetes, Type 2, Oral Hypoglycemic Agents  Renal/GU   negative genitourinary   Musculoskeletal  (+) Arthritis ,   Abdominal   Peds  Hematology   Anesthesia Other Findings   Reproductive/Obstetrics negative OB ROS                          Lab Results  Component Value Date   WBC 9.2 06/27/2015   HGB 10.4* 06/27/2015   HCT 33.2* 06/27/2015   MCV 97.9 06/27/2015   PLT 175 06/27/2015   Lab Results  Component Value Date   CREATININE 0.96 06/27/2015   BUN 19 06/27/2015   NA 137 06/27/2015   K 4.2 06/27/2015   CL 103 06/27/2015   CO2 28 06/27/2015   Lab Results  Component Value Date   INR 1.43 06/26/2015   INR 1.21 06/25/2013   EKG: normal sinus rhythm, PAC's noted.  Echo: - Left ventricle: The cavity size was normal. Wall thickness was increased in a pattern of mild LVH. The estimated ejection fraction was 60%. Wall motion was normal; there were no regional wall motion abnormalities. - Aortic valve: The valve appears to be grossly normal. There was mild regurgitation. - Mitral valve: There was mild regurgitation. - Left atrium: The atrium was mildly dilated. - Pulmonary arteries: PA peak pressure: 42 mm Hg (S). - Impressions: No cardiac source of embolism was identified, but cannot be ruled out on the basis of this examination.   Anesthesia Physical Anesthesia Plan  ASA: II  Anesthesia  Plan: General   Post-op Pain Management:    Induction: Intravenous  Airway Management Planned: Oral ETT  Additional Equipment:   Intra-op Plan:   Post-operative Plan: Extubation in OR  Informed Consent: I have reviewed the patients History and Physical, chart, labs and discussed the procedure including the risks, benefits and alternatives for the proposed anesthesia with the patient or authorized representative who has indicated his/her understanding and acceptance.   Dental advisory given  Plan Discussed with: CRNA  Anesthesia Plan Comments:         Anesthesia Quick Evaluation

## 2015-06-27 NOTE — H&P (View-Only) (Signed)
Reason for Consult:left IT Hip fx.  [facility] patient Referring Physician: [place] Pink MD  [facility] is an 74 y.o. female.  HPI: 74 yo with multiple medical problems, sarcoidosis, Hx DVT with PE , MRSA infections requiring surgery, with fall at home and left IT hip fx.   Past Medical History  Diagnosis Date  . Sarcoidosis ([facility])   . Hiatal hernia   . Diabetes mellitus without complication ([facility])   . Osteoporosis   . DVT (deep venous thrombosis) ([facility])   . Pulmonary emboli [facility])     Past Surgical History  Procedure Laterality Date  . Abdominal hysterectomy    . Joint replacement      toe  . Lung lobectomy Left     Lower  . I&d extremity Left 09/13/2014    Procedure: IRRIGATION AND DEBRIDEMENT EXTREMITY;  Surgeon: Linna Hoff, MD;  Location: [facility];  Service: Orthopedics;  Laterality: Left;    Family History  Problem Relation Age of Onset  . Heart disease Mother   . Hypertension Mother   . Cancer Father     pancratic  . Cancer - Other Sister     Social History:  reports that she quit smoking about 14 years ago. Her smoking use included Cigarettes. She does not have any smokeless tobacco history on file. She reports that she does not drink alcohol or use illicit drugs.  Allergies:  Allergies  Allergen Reactions  . Advil [Ibuprofen] Shortness Of Breath and Swelling  . Propoxyphene Swelling  . Azo [Phenazopyridine] Other (See Comments)    Unknown reaction  . Indocin [Indomethacin] Other (See Comments)    Unknown reaction  . Keflex [Cephalexin] Other (See Comments)  . Loratadine Other (See Comments)    unknown  . Percocet [Oxycodone-Acetaminophen] Itching    Medications: I have reviewed the patient's current medications.  Results for orders placed or performed during the [facility] encounter of 06/25/15 (from the past 48 hour(s))  Basic metabolic panel     Status: Abnormal   Collection Time: 06/25/15  7:51 PM  Result Value Ref Range   Sodium 141 135 - 145 mmol/L    Potassium 4.1 3.5 - 5.1 mmol/L   Chloride 105 101 - 111 mmol/L   CO2 28 22 - 32 mmol/L   Glucose, Bld 99 65 - 99 mg/dL   BUN 14 6 - 20 mg/dL   Creatinine, Ser 0.71 0.44 - 1.00 mg/dL   Calcium 10.5 (H) 8.9 - 10.3 mg/dL   GFR calc non Af Amer >60 >60 mL/min   GFR calc Af Amer >60 >60 mL/min    Comment: (NOTE) The eGFR has been calculated using the CKD EPI equation. This calculation has not been validated in all clinical situations. eGFR's persistently <60 mL/min signify possible Chronic Kidney Disease.    Anion gap 8 5 - 15  Type and screen     Status: None   Collection Time: 06/25/15  8:40 PM  Result Value Ref Range   ABO/RH(D) A POS    Antibody Screen NEG    Sample Expiration 06/28/2015   CBC with Differential/Platelet     Status: Abnormal   Collection Time: 06/25/15  8:40 PM  Result Value Ref Range   WBC 13.7 (H) 4.0 - 10.5 K/uL   RBC 3.92 3.87 - 5.11 MIL/uL   Hemoglobin 12.5 12.0 - 15.0 g/dL   HCT 38.8 36.0 - 46.0 %   MCV 99.0 78.0 - 100.0 fL   MCH 31.9 26.0 - 34.0 pg  MCHC 32.2 30.0 - 36.0 g/dL   RDW 12.1 11.5 - 15.5 %   Platelets 234 150 - 400 K/uL   Neutrophils Relative % 78 %   Neutro Abs 10.6 (H) 1.7 - 7.7 K/uL   Lymphocytes Relative 17 %   Lymphs Abs 2.3 0.7 - 4.0 K/uL   Monocytes Relative 5 %   Monocytes Absolute 0.7 0.1 - 1.0 K/uL   Eosinophils Relative 0 %   Eosinophils Absolute 0.0 0.0 - 0.7 K/uL   Basophils Relative 0 %   Basophils Absolute 0.0 0.0 - 0.1 K/uL  ABO/Rh     Status: None   Collection Time: 06/25/15  8:40 PM  Result Value Ref Range   ABO/RH(D) A POS   Urinalysis, Routine w reflex microscopic (not at [facility])     Status: Abnormal   Collection Time: 06/25/15 10:12 PM  Result Value Ref Range   Color, Urine YELLOW YELLOW   APPearance TURBID (A) CLEAR   Specific Gravity, Urine 1.015 1.005 - 1.030   pH 8.5 (H) 5.0 - 8.0   Glucose, UA NEGATIVE NEGATIVE mg/dL   Hgb urine dipstick SMALL (A) NEGATIVE   Bilirubin Urine NEGATIVE NEGATIVE   Ketones,  ur NEGATIVE NEGATIVE mg/dL   Protein, ur NEGATIVE NEGATIVE mg/dL   Nitrite NEGATIVE NEGATIVE   Leukocytes, UA NEGATIVE NEGATIVE  Urine microscopic-add on     Status: Abnormal   Collection Time: 06/25/15 10:12 PM  Result Value Ref Range   Squamous Epithelial / LPF 0-5 (A) NONE SEEN    Comment: Please note change in reference range.   WBC, UA 0-5 0 - 5 WBC/hpf    Comment: Please note change in reference range.   RBC / HPF 0-5 0 - 5 RBC/hpf    Comment: Please note change in reference range.   Bacteria, UA MANY (A) NONE SEEN    Comment: Please note change in reference range.   Casts GRANULAR CAST (A) NEGATIVE   Crystals TRIPLE PHOSPHATE CRYSTALS (A) NEGATIVE  Glucose, capillary     Status: Abnormal   Collection Time: 06/25/15 11:52 PM  Result Value Ref Range   Glucose-Capillary 144 (H) 65 - 99 mg/dL  Surgical pcr screen     Status: None   Collection Time: 06/26/15 12:24 AM  Result Value Ref Range   MRSA, PCR NEGATIVE NEGATIVE   Staphylococcus aureus NEGATIVE NEGATIVE    Comment:        The Xpert SA Assay (FDA approved for NASAL specimens in patients over 62 years of age), is one component of a comprehensive surveillance program.  Test performance has been validated by [place] for patients greater than or equal to 41 year old. It is not intended to diagnose infection nor to guide or monitor treatment.   Protime-INR     Status: Abnormal   Collection Time: 06/26/15  2:25 AM  Result Value Ref Range   Prothrombin Time 17.6 (H) 11.6 - 15.2 seconds   INR 1.43 0.00 - 1.49  Glucose, capillary     Status: Abnormal   Collection Time: 06/26/15  4:21 AM  Result Value Ref Range   Glucose-Capillary 151 (H) 65 - 99 mg/dL  Glucose, capillary     Status: Abnormal   Collection Time: 06/26/15  8:19 AM  Result Value Ref Range   Glucose-Capillary 117 (H) 65 - 99 mg/dL    Ct Pelvis Wo Contrast  06/25/2015  CLINICAL DATA:  Fall in bathroom. Left-sided sacral and pelvic pain. EXAM: CT  PELVIS  WITHOUT CONTRAST TECHNIQUE: Multidetector CT imaging of the pelvis was performed following the standard protocol without intravenous contrast. COMPARISON:  None. FINDINGS: Comminuted intertrochanteric left hip fracture is seen. No evidence of dislocation. A nondisplaced fracture of the left superior pubic ramus is also demonstrated. No other pelvic fractures identified. No evidence of pelvic joint diastasis. No evidence of intrapelvic mass, hematoma, or free fluid. Prior hysterectomy noted. Sigmoid diverticulosis is demonstrated, without evidence of diverticulitis. Multiple small calculi incidentally noted with the urinary bladder. IMPRESSION: Comminuted intertrochanteric left hip fracture. Nondisplaced fracture of left superior pubic ramus. Electronically Signed   By: Earle Gell M.D.   On: 06/25/2015 18:44    Review of Systems  Constitutional: Negative for fever and chills.  HENT: Negative for ear discharge.   Respiratory: Negative for cough.        Past smoker quit 14 yrs ago  Cardiovascular: Positive for orthopnea. Negative for claudication.  Gastrointestinal: Negative for vomiting and abdominal pain.  Musculoskeletal: Positive for myalgias, joint pain and falls.  Neurological: Positive for weakness. Negative for dizziness and headaches.  Psychiatric/Behavioral: Negative for substance abuse.   Blood pressure 114/54, pulse 99, temperature 98.2 F (36.8 C), temperature source Oral, resp. rate 18, height $RemoveBe'5\' 6"'TFCSJqLQD$  (1.676 m), weight 49.896 kg (110 lb), SpO2 96 %. Physical Exam  Constitutional: She is oriented to person, place, and time. She appears well-developed and well-nourished.  HENT:  Head: Normocephalic.  Neck: Normal range of motion. Neck supple. No thyromegaly present.  Cardiovascular: Normal rate.   Respiratory: Effort normal.  GI: Soft.  Musculoskeletal:  Pain with hip ROM left. Short and ER pulses intact  Neurological: She is alert and oriented to person, place, and time.   Skin: No rash noted. No erythema.  Psychiatric: She has a normal mood and affect. Her behavior is normal. Thought content normal.    Assessment/Plan: Left IT Hip Fx. Will feed today and do surgery tomorrow.  Literature not conclusive about waiting for surgery while on Xeralto for non life threating injuries. Will proceed with troch Nail tomorrow. Risks discussed. Will restart Xeralto post op. Procedure discussed all ?'s answered.   Dynastee Brummell C 06/26/2015, 2:46 PM

## 2015-06-27 NOTE — Progress Notes (Signed)
[facility] PROGRESS NOTE  [facility] [id number] DOB: [birth year] DOA: 06/25/2015  PCP: Chevis Pretty, FNP  Brief HPI: 74 year old Caucasian female with a past medical history of sarcoidosis on chronic prednisone, paroxysmal atrial fibrillation in February 2016, type 2 diabetes, history of an embolism on anticoagulation, recent right leg cellulitis for which she was placed on Bactrim presented after what appears to be a mechanical fall resulting in left hip fracture. There was also a nondisplaced fracture of the left superior pubic ramus. Patient was hospitalized for further management.  Past medical history:  Past Medical History  Diagnosis Date  . Sarcoidosis ([place])   . Hiatal hernia   . Diabetes mellitus without complication ([place])   . Osteoporosis   . DVT (deep venous thrombosis) ([place])   . Pulmonary emboli [facility])     Consultants: Orthopedics  Procedures: Plan is for ORIF today  Antibiotics: Patient has been continued on her Bactrim. Course will finish Today.  Subjective: Patient Feels well. Occasional pain in the left hip. Denies any chest pain or shortness of breath. At home she is wheelchair-bound and has very low functional opacity.  Objective: Vital Signs  Filed Vitals:   06/26/15 0424 06/26/15 1400 06/26/15 2055 06/27/15 0532  BP: 114/54 116/92 146/55 136/65  Pulse: 99 76 95 75  Temp: 98.2 F (36.8 C) 99.6 F (37.6 C) 97.7 F (36.5 C) 98.3 F (36.8 C)  TempSrc:  Oral Oral Oral  Resp: 18 18 18 18   Height:      Weight:      SpO2: 96% 95% 96% 100%    Intake/Output Summary (Last 24 hours) at 06/27/15 [id number] Last data filed at 06/27/15 0532  Gross per 24 hour  Intake      0 ml  Output    700 ml  Net   -700 ml   Filed Weights   06/25/15 1718  Weight: 49.896 kg (110 lb)    General appearance: alert, cooperative, appears stated age Resp: clear to auscultation bilaterally Cardio: regular rate and rhythm, S1, S2 normal, no murmur, click,  rub or gallop GI: soft, non-tender; bowel sounds normal; no masses,  no organomegaly Extremities: Left lower extremity is externally rotated Neurologic: Awake and alert. Oriented 3. No focal neurological deficits noted.  Lab Results:  Basic Metabolic Panel:  Recent Labs Lab 06/25/15 1951 06/27/15 0608  NA 141 137  K 4.1 4.2  CL 105 103  CO2 28 28  GLUCOSE 99 141*  BUN 14 19  CREATININE 0.71 0.96  CALCIUM 10.5* 9.6   CBC:  Recent Labs Lab 06/25/15 2040 06/27/15 0608  WBC 13.7* 9.2  NEUTROABS 10.6*  --   HGB 12.5 10.4*  HCT 38.8 33.2*  MCV 99.0 97.9  PLT 234 175   CBG:  Recent Labs Lab 06/26/15 1143 06/26/15 1705 06/26/15 2055 06/27/15 0051 06/27/15 0438  GLUCAP 115* 196* 187* 211* 154*    Recent Results (from the past 240 hour(s))  Surgical pcr screen     Status: None   Collection Time: 06/26/15 12:24 AM  Result Value Ref Range Status   MRSA, PCR NEGATIVE NEGATIVE Final   Staphylococcus aureus NEGATIVE NEGATIVE Final    Comment:        The Xpert SA Assay (FDA approved for NASAL specimens in patients over 66 years of age), is one component of a comprehensive surveillance program.  Test performance has been validated by [facility] for patients greater than or equal to 45 year old. It is  not intended to diagnose infection nor to guide or monitor treatment.   MRSA PCR Screening     Status: None   Collection Time: 06/27/15  5:25 AM  Result Value Ref Range Status   MRSA by PCR NEGATIVE NEGATIVE Final    Comment:        The GeneXpert MRSA Assay (FDA approved for NASAL specimens only), is one component of a comprehensive MRSA colonization surveillance program. It is not intended to diagnose MRSA infection nor to guide or monitor treatment for MRSA infections.       Studies/Results: Ct Pelvis Wo Contrast  06/25/2015  CLINICAL DATA:  Fall in bathroom. Left-sided sacral and pelvic pain. EXAM: CT PELVIS WITHOUT CONTRAST TECHNIQUE:  Multidetector CT imaging of the pelvis was performed following the standard protocol without intravenous contrast. COMPARISON:  None. FINDINGS: Comminuted intertrochanteric left hip fracture is seen. No evidence of dislocation. A nondisplaced fracture of the left superior pubic ramus is also demonstrated. No other pelvic fractures identified. No evidence of pelvic joint diastasis. No evidence of intrapelvic mass, hematoma, or free fluid. Prior hysterectomy noted. Sigmoid diverticulosis is demonstrated, without evidence of diverticulitis. Multiple small calculi incidentally noted with the urinary bladder. IMPRESSION: Comminuted intertrochanteric left hip fracture. Nondisplaced fracture of left superior pubic ramus. Electronically Signed   By: Earle Gell M.D.   On: 06/25/2015 18:44    Medications:  Scheduled: . hydrocortisone sod succinate (SOLU-CORTEF) inj  50 mg Intravenous Q8H  . Influenza vac split quadrivalent PF  0.5 mL Intramuscular Tomorrow-1000  . insulin aspart  0-9 Units Subcutaneous 6 times per day  . metoprolol tartrate  25 mg Oral BID  . sulfamethoxazole-trimethoprim  1 tablet Oral BID  . vancomycin  1,000 mg Intravenous Once   Continuous:  [id number] sodium, morphine injection  Assessment/Plan:  Principal Problem:   Closed left hip fracture (HCC) Active Problems:   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   DM type 2 (diabetes mellitus, type 2) (HCC)   Wheelchair bound   History of pulmonary embolism    Left hip fracture along with nondisplaced fracture of the left superior pubic ramus Patient will need operative intervention for the hip fracture. Orthopedics has been consulted. Plan is for surgical intervention this morning. Pain control.  Preoperative evaluation Patient's medical history was reviewed. EKG reviewed: Reveals LBBB which was also seen in Feb 2016. She had an episode of Afib with demand ischemia in setting of cellulitis at that time. She was seen by cardiology at  that time who did not think she needed an ischemic work up. She did have an ECHO which revealed normal EF. She has low functional capacity at baseline. She will benefit from being seen by cardiology preoperatively to see if she needs ischemic work up prior to her surgery. She was seen by cardiology yesterday. No further testing is planned. She may proceed to surgery. Xarelto was last taken on the evening of 11/18. Patient's renal function is normal. Xarelto has been held. Okay to proceed to surgery once cleared by cardiology.  History of paroxysmal atrial fibrillation This occurred in the setting of cellulitis earlier this year. Currently in sinus rhythm. Echocardiogram showed normal systolic function without wall motion abnormalities. Patient is on anticoagulation, which has been held due to need for surgical intervention. Continue her beta blocker.  History of pulmonary embolism This was the original reason for her being on anticoagulation. Currently stable.  History of type 2 diabetes Oral agents are on hold. Continue sliding scale coverage. Anticipate some  worsening in her glycemic control due to steroids.  History of sarcoidosis on chronic steroids She has been on oral prednisone for many years. Continue stress dose steroids with hydrocortisone. Anticipate quick taper post operatively.  Recent right lower extremity cellulitis She was on oral Bactrim for same. She will complete the course of antibiotics today. No evidence for active infection in the right leg.  Normocytic anemia Hemoglobin is stable. Continue to monitor.  DVT Prophylaxis: SCDs    Code Status: Full code  Family Communication: Discussed with the patient Disposition Plan: Await Surgery.     LOS: 2 days   [place] [facility] Pager [phone] 06/27/2015, 8:07 AM  If 7PM-7AM, please contact night-coverage at [email or web address], password [facility]

## 2015-06-27 NOTE — Anesthesia Postprocedure Evaluation (Signed)
Anesthesia Post Note  Patient: [facility]  Procedure(s) Performed: Procedure(s) (LRB): INTRAMEDULLARY (IM) NAIL INTERTROCHANTRIC AFFIXIS (Left)  Patient location during evaluation: PACU Anesthesia Type: General Level of consciousness: awake and alert Pain management: pain level controlled Vital Signs Assessment: post-procedure vital signs reviewed and stable Respiratory status: spontaneous breathing, nonlabored ventilation, respiratory function stable and patient connected to nasal cannula oxygen Cardiovascular status: blood pressure returned to baseline and stable Postop Assessment: No signs of nausea or vomiting Anesthetic complications: no    Last Vitals:  Filed Vitals:   06/27/15 1842 06/27/15 1956  BP: 133/58 121/51  Pulse:  86  Temp:  37.1 C  Resp: 13 16    Last Pain:  Filed Vitals:   06/27/15 2019  PainSc: 0-No pain                 [name]

## 2015-06-28 ENCOUNTER — Encounter (HOSPITAL_COMMUNITY): Payer: Self-pay | Admitting: Orthopaedic Surgery

## 2015-06-28 DIAGNOSIS — E43 Unspecified severe protein-calorie malnutrition: Secondary | ICD-10-CM | POA: Insufficient documentation

## 2015-06-28 DIAGNOSIS — L8990 Pressure ulcer of unspecified site, unspecified stage: Secondary | ICD-10-CM | POA: Insufficient documentation

## 2015-06-28 DIAGNOSIS — S72142A Displaced intertrochanteric fracture of left femur, initial encounter for closed fracture: Secondary | ICD-10-CM | POA: Diagnosis not present

## 2015-06-28 LAB — COMPREHENSIVE METABOLIC PANEL
ALBUMIN: 2.3 g/dL — AB (ref 3.5–5.0)
ALK PHOS: 33 U/L — AB (ref 38–126)
ALT: 17 U/L (ref 14–54)
AST: 23 U/L (ref 15–41)
Anion gap: 6 (ref 5–15)
BILIRUBIN TOTAL: 0.5 mg/dL (ref 0.3–1.2)
BUN: 14 mg/dL (ref 6–20)
CALCIUM: 8.9 mg/dL (ref 8.9–10.3)
CO2: 24 mmol/L (ref 22–32)
CREATININE: 0.89 mg/dL (ref 0.44–1.00)
Chloride: 105 mmol/L (ref 101–111)
GFR calc Af Amer: >60 mL/min (ref >60)
GLUCOSE: 271 mg/dL — AB (ref 65–99)
POTASSIUM: 4.1 mmol/L (ref 3.5–5.1)
Sodium: 135 mmol/L (ref 135–145)
TOTAL PROTEIN: 5.8 g/dL — AB (ref 6.5–8.1)

## 2015-06-28 LAB — BASIC METABOLIC PANEL
Anion gap: 5 (ref 5–15)
BUN: 14 mg/dL (ref 6–20)
CALCIUM: 9.1 mg/dL (ref 8.9–10.3)
CHLORIDE: 103 mmol/L (ref 101–111)
CO2: 26 mmol/L (ref 22–32)
CREATININE: 0.76 mg/dL (ref 0.44–1.00)
Glucose, Bld: 182 mg/dL — ABNORMAL HIGH (ref 65–99)
Potassium: 4.2 mmol/L (ref 3.5–5.1)
SODIUM: 134 mmol/L — AB (ref 135–145)

## 2015-06-28 LAB — CBC
HCT: 27 % — ABNORMAL LOW (ref 36.0–46.0)
HEMOGLOBIN: 9 g/dL — AB (ref 12.0–15.0)
MCH: 32.4 pg (ref 26.0–34.0)
MCHC: 33.3 g/dL (ref 30.0–36.0)
MCV: 97.1 fL (ref 78.0–100.0)
Platelets: 145 10*3/uL — ABNORMAL LOW (ref 150–400)
RBC: 2.78 MIL/uL — ABNORMAL LOW (ref 3.87–5.11)
RDW: 12.1 % (ref 11.5–15.5)
WBC: 8.9 10*3/uL (ref 4.0–10.5)

## 2015-06-28 LAB — GLUCOSE, CAPILLARY
GLUCOSE-CAPILLARY: 170 mg/dL — AB (ref 65–99)
GLUCOSE-CAPILLARY: 210 mg/dL — AB (ref 65–99)
GLUCOSE-CAPILLARY: 212 mg/dL — AB (ref 65–99)
Glucose-Capillary: 167 mg/dL — ABNORMAL HIGH (ref 65–99)
Glucose-Capillary: 120 mg/dL — ABNORMAL HIGH (ref 65–99)
Glucose-Capillary: 100 mg/dL — ABNORMAL HIGH (ref 65–99)

## 2015-06-28 MED ORDER — HYDROCORTISONE NA SUCCINATE PF 100 MG IJ SOLR
50.0000 mg | Freq: Two times a day (BID) | INTRAMUSCULAR | Status: DC
  Administered 2015-06-28: 50 mg via INTRAVENOUS
  Filled 2015-06-28 (×4): qty 1

## 2015-06-28 MED ORDER — OXYCODONE-ACETAMINOPHEN 5-325 MG PO TABS: 1.0000 | ORAL_TABLET | ORAL | Status: DC | PRN

## 2015-06-28 MED ORDER — HYDROCODONE-ACETAMINOPHEN 5-325 MG PO TABS
1.0000 | ORAL_TABLET | ORAL | Status: DC | PRN
  Administered 2015-06-28 – 2015-06-29 (×5): 2 via ORAL
  Filled 2015-06-28 (×5): qty 2

## 2015-06-28 NOTE — Progress Notes (Signed)
Subjective: 1 Day Post-Op Procedure(s) (LRB): INTRAMEDULLARY (IM) NAIL INTERTROCHANTRIC AFFIXIS (Left) Patient reports pain as moderate.    Objective: Vital signs in last 24 hours: Temp:  [97.2 F (36.2 C)-98.7 F (37.1 C)] 98.4 F (36.9 C) (11/22 0421) Pulse Rate:  [70-89] 72 (11/22 0421) Resp:  [11-18] 16 (11/21 1956) BP: (118-137)/(41-58) 118/41 mmHg (11/22 0421) SpO2:  [95 %-100 %] 96 % (11/22 0421)  Intake/Output from previous day: 11/21 0701 - 11/22 0700 In: 900 [I.V.:900] Out: 1500 [Urine:1450; Blood:50] Intake/Output this shift:     Recent Labs  06/25/15 2040 06/27/15 0608 06/28/15 0452  HGB 12.5 10.4* 9.0*    Recent Labs  06/27/15 0608 06/28/15 0452  WBC 9.2 8.9  RBC 3.39* 2.78*  HCT 33.2* 27.0*  PLT 175 145*    Recent Labs  06/27/15 0608 06/28/15 0452  NA 137 134*  K 4.2 4.2  CL 103 103  CO2 28 26  BUN 19 14  CREATININE 0.96 0.76  GLUCOSE 141* 182*  CALCIUM 9.6 9.1    Recent Labs  06/26/15 0225  INR 1.43    Neurologically intact  Assessment/Plan: 1 Day Post-Op Procedure(s) (LRB): INTRAMEDULLARY (IM) NAIL INTERTROCHANTRIC AFFIXIS (Left) Up with therapy  Arnett Duddy C 06/28/2015, 7:19 AM

## 2015-06-28 NOTE — Op Note (Signed)
NAMEKENNIS, OGLESBY NO.:  [id number]  MEDICAL RECORD NO.:  [id number]  LOCATION:  5N22C                        FACILITY:  [facility]  PHYSICIAN:  Chellsea Beckers C. Lorin Mercy, M.D.    DATE OF BIRTH:  August 01, 1941  DATE OF PROCEDURE:  06/27/2015 DATE OF DISCHARGE:                              OPERATIVE REPORT   PREOPERATIVE DIAGNOSIS:  Left trochanteric nail.  POSTOPERATIVE DIAGNOSIS:  Left trochanteric nail.  PROCEDURE:  Left intramedullary trochanteric nail, long, AFFIXUS 360 x 11-mm with proximal distal interlock.  SURGEON:  Lia Vigilante C. Lorin Mercy, M.D.  ANESTHESIA:  General.  DESCRIPTION OF PROCEDURE:  After induction of general anesthesia, prepping and draping with the patient on the Hana table, traction, internal rotation and lateralization of the trochanter.  C-arm was brought in, the good leg was taken down to the floor, so the lateral hip could be visualized.  Prepping with DuraPrep, the usual shower curtain drape was applied after the area was squared with towels.  Antibiotics were given preoperatively.  Incision was made proximal to the trochanter.  After time-out procedure, fascia was split.  Tip of the trochanter had K-wire started, checked under fluoro, overreamed and then beaded tipped rod was placed down the knee, measured at 415 and the 380 nail was selected.  An 11 x 380 nail was inserted.  Pin was drilled, centered up at the hip.  The lateral guide obscured.  The lateral x-ray since the Hana bed was used and the opposite leg was not in flexed well leg holder position.  Pin was then positioned as screw inserted, which slipped outside of the fracture site.  This was seen under C-arm and lateral screw was backed up.  Pin was reposition and screw was inserted, center in the head on AP and center on lateral.  A 95-mm screw was used. Proximally, the screw was tightened down, backed up some, allowed to slide and then the anatomic carbon fiber graphite alignment jig  was removed.  Freehand technique was used for the distal static locking screw making a round hole drilling and depth gauge measurement of 40 mm and insertion of screw.  Final spot pictures were taken.  Good position and alignment of the fracture.  Irrigation of saline solution, closure of deep fascia with #0 Vicryl sutures, 2-0 on the subcutaneous tissue, Marcaine infiltration in the skin.  Skin staple closure with Mepilex dressing and transferred to the recovery room.  Instrument count and needle counts were correct.     Gannon Heinzman C. Lorin Mercy, M.D.     MCY/MEDQ  D:  06/27/2015  T:  06/28/2015  Job:  [id number]

## 2015-06-28 NOTE — Clinical Documentation Improvement (Signed)
Hospitalist  Please update your documentation within the medical record to reflect your response to this query. Thank you  Can the diagnosis of Malnutrition be further specified?   Document Severity - Severe(third degree), Moderate (second degree), Mild (first degree)  Other condition  Unable to clinically determine  Document any associated diagnoses/conditions  Supporting Information: :  06/27/15 Nutrition eval with nutrition dx "Severe malnutrition in context of chronic illness, Underweight"... Tx: See full Nutrition eval 06/27/15  Please exercise your independent, professional judgment when responding. A specific answer is not anticipated or expected.  Thank You, Ermelinda Das, RN, BSN, [place] Certified Clinical Documentation Specialist [place]: Health Information Management [phone]

## 2015-06-28 NOTE — Care Management Note (Signed)
Case Management Note  Patient Details  Name: Elizabeth [facility] MRN: [id number] Date of Birth: [birth year]  Subjective/Objective:      Admitted with left hip fracture, s/p IM nail intratrochantric 06/27/15               Action/Plan: PT recommending SNF. Referral made to CSW. Will continue to follow for d/c needs.  Expected Discharge Date:  06/29/15               Expected Discharge Plan:  Skilled Nursing Facility  In-House Referral:  Clinical Social Work  Discharge planning Services  CM Consult  Post Acute Care Choice:  NA Choice offered to:     DME Arranged:    DME Agency:     HH Arranged:    HH Agency:     Status of Service:  In process, will continue to follow  Medicare Important Message Given:    Date Medicare IM Given:    Medicare IM give by:    Date Additional Medicare IM Given:    Additional Medicare Important Message give by:     If discussed at [place] of Stay Meetings, dates discussed:    Additional Comments:  Nila Nephew, RN 06/28/2015, 11:15 AM

## 2015-06-28 NOTE — Clinical Social Work Placement (Signed)
   CLINICAL SOCIAL WORK PLACEMENT  NOTE  Date:  06/28/2015  Patient Details  Name: Elizabeth Drake MRN: [id number] Date of Birth: 11-22-1940  Clinical Social Work is seeking post-discharge placement for this patient at the [place] level of care (*CSW will initial, date and re-position this form in  chart as items are completed):  Yes   Patient/family provided with [facility] Work Department's list of facilities offering this level of care within the geographic area requested by the patient (or if unable, by the patient's family).  Yes   Patient/family informed of their freedom to choose among providers that offer the needed level of care, that participate in Medicare, Medicaid or managed care program needed by the patient, have an available bed and are willing to accept the patient.  Yes   Patient/family informed of [facility]'s ownership interest in [facility] and [facility], as well as of the fact that they are under no obligation to receive care at these facilities.  PASRR submitted to EDS on       PASRR number received on       Existing PASRR number confirmed on 06/28/15     FL2 transmitted to all facilities in geographic area requested by pt/family on 06/28/15     FL2 transmitted to all facilities within larger geographic area on       Patient informed that his/her managed care company has contracts with or will negotiate with certain facilities, including the following:            Patient/family informed of bed offers received.  Patient chooses bed at       Physician recommends and patient chooses bed at      Patient to be transferred to   on  .  Patient to be transferred to facility by       Patient family notified on   of transfer.  Name of family member notified:        PHYSICIAN Please sign FL2     Additional Comment:    _______________________________________________ Caroline Sauger, LCSW 06/28/2015, 3:25  PM

## 2015-06-28 NOTE — Discharge Instructions (Addendum)
Information on my medicine - XARELTO (rivaroxaban)  This medication education was reviewed with me or my healthcare representative as part of my discharge preparation.  The pharmacist that spoke with me during my hospital stay was:  Georgina Peer, [place] FOR YOU? Xarelto was prescribed to treat blood clots that may have been found in the veins of your legs (deep vein thrombosis) or in your lungs (pulmonary embolism) and to reduce the risk of them occurring again. History of atrial fibrillation and recent orthopedic surgery.  What do you need to know about Xarelto? Take one 20 mg tablet taken ONCE A DAY with your evening meal.  DO NOT stop taking Xarelto without talking to the health care provider who prescribed the medication.  Refill your prescription for 20 mg tablets before you run out.  After discharge, you should have regular check-up appointments with your healthcare provider that is prescribing your Xarelto.  In the future your dose may need to be changed if your kidney function changes by a significant amount.  What do you do if you miss a dose?  If you are taking Xarelto ONCE DAILY and you miss a dose, take it as soon as you remember on the same day then continue your regularly scheduled once daily regimen the next day. Do not take two doses of Xarelto at the same time.   Important Safety Information Xarelto is a blood thinner medicine that can cause bleeding. You should call your healthcare provider right away if you experience any of the following: ? Bleeding from an injury or your nose that does not stop. ? Unusual colored urine (red or dark brown) or unusual colored stools (red or black). ? Unusual bruising for unknown reasons. ? A serious fall or if you hit your head (even if there is no bleeding).  Some medicines may interact with Xarelto and might increase your risk of bleeding while on Xarelto. To help avoid this, consult your  healthcare provider or pharmacist prior to using any new prescription or non-prescription medications, including herbals, vitamins, non-steroidal anti-inflammatory drugs (NSAIDs) and supplements.  This website has more information on Xarelto: [email or web address]   ORTHOPEDIC DISCHARGE INSTRUCTIONS FOR ORIF LEFT HIP -ok to shower but no tub soaking -do not apply any creams or ointments to incision -weightbear as tolerated left hip lower extremity with walker -continue physical therapy per protocol   -no driving, squatting, lifting.

## 2015-06-28 NOTE — Care Management Important Message (Signed)
Important Message  Patient Details  Name: Elizabeth Drake MRN: [id number] Date of Birth: 10/03/1940   Medicare Important Message Given:  Yes    Nielle Duford P Cornelio Parkerson 06/28/2015, 3:04 PM

## 2015-06-28 NOTE — Progress Notes (Signed)
OT Cancellation Note  Patient Details Name: Elizabeth Drake MRN: [id number] DOB: 1940-10-03   Cancelled Treatment:    Reason Eval/Treat Not Completed: Other (comment) Pt is Medicare and current D/C plan is SNF per PT note and per their note pt agrees. No apparent immediate acute care OT needs, therefore will defer OT to SNF. If OT eval is needed please call Acute Rehab Dept. at [phone] or text page OT at [phone].    Almon Register [id number] 06/28/2015, 1:34 PM

## 2015-06-28 NOTE — NC FL2 (Signed)
[place] LEVEL OF CARE SCREENING TOOL     IDENTIFICATION  Patient Name: Elizabeth Drake Birthdate: [birth year] Sex: female Admission Date (Current Location): 06/25/2015  [facility] and [place] Number:     Facility and Address:  [facility] [place] [address] [place],  [address]      Provider Number: [id number]  Attending Physician Name and Address:  Bonnielee Haff, MD  Relative Name and Phone Number:       Current Level of Care: Hospital Recommended Level of Care: [place] Prior Approval Number:    Date Approved/Denied:   PASRR Number: [id number]  Discharge Plan: SNF    Current Diagnoses: Patient Active Problem List   Diagnosis Date Noted  . Protein-calorie malnutrition, severe 06/28/2015  . Pressure ulcer 06/28/2015  . Paroxysmal atrial fibrillation (HCC)   . Closed left hip fracture ([place]) 06/25/2015  . Atrial fibrillation with RVR ([place]) 09/13/2014  . Hypertension 10/12/2013  . Physical deconditioning 06/30/2013  . History of pulmonary embolism 06/24/2013  . DM type 2 (diabetes mellitus, type 2) ([place]) 04/25/2013  . OAB (overactive bladder) 04/25/2013  . Wheelchair bound 04/25/2013  . Osteoporosis 04/25/2013  . Chronic pain syndrome (LEGS/BACK) 04/25/2013  . OA (osteoarthritis) 04/25/2013  . Hyperlipidemia 12/31/2012  . 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 Surgical wounds, PU Stage and Appropriate Care PU Stage 1 Dressing:  (PRN)        AMBULATORY STATUS RESPIRATION    Assist extensive O2 (As needed)      Personal Care  Assistance Level of Assistance  Bathing, Feeding, Dressing Bathing Assistance: Maximum assistance Feeding assistance: Limited assistance Dressing Assistance: Maximum assistance      Functional Limitations Info   (n/a)             SPECIAL CARE FACTORS FREQUENCY  PT (By licensed PT), OT (By licensed OT)     PT Frequency: 5 OT Frequency: 5           Additional Factors Info  Code Status, Allergies, Isolation Precautions Code Status Info: FULL Allergies Info: Advil Ibuprofen, Propoxyphene, Azo Phenazopyridine, Indocin Indomethacin, Keflex Cephalexin, Loratadine, Percocet Oxycodone-acetaminophen     Isolation Precautions Info: MRSA     Current Medications (06/28/2015): Current Facility-Administered Medications  Medication Dose Route Frequency Provider Last Rate Last Dose  . acetaminophen (TYLENOL) tablet 650 mg  650 mg Oral Q6H PRN Lanae Crumbly, PA-C       Or  . acetaminophen (TYLENOL) suppository 650 mg  650 mg Rectal Q6H PRN Lanae Crumbly, PA-C      . docusate sodium (COLACE) capsule 100 mg  100 mg Oral Daily PRN Etta Quill, DO      . docusate sodium (COLACE) capsule 100 mg  100 mg Oral BID Lanae Crumbly, PA-C   100 mg at 06/28/15 0845  . feeding supplement (BOOST / RESOURCE BREEZE) liquid 1 Container  1 Container Oral BID BM [place], RD      . HYDROcodone-acetaminophen (NORCO/VICODIN) 5-325 MG per tablet 1-2 tablet  1-2 tablet Oral Q4H PRN Jeryl [place], NP   2 tablet at 06/28/15 1243  .  hydrocortisone sodium succinate (SOLU-CORTEF) 100 MG injection 50 mg  50 mg Intravenous Q12H Bonnielee Haff, MD      . HYDROmorphone (DILAUDID) injection 0.5 mg  0.5 mg Intravenous Q3H PRN Lanae Crumbly, PA-C   0.5 mg at 06/28/15 0424  . insulin aspart (novoLOG) injection 0-9 Units  0-9 Units Subcutaneous 6 times per day Etta Quill, DO   3 Units at 06/28/15 0844  . menthol-cetylpyridinium (CEPACOL) lozenge 3 mg  1 lozenge Oral PRN Lanae Crumbly, PA-C       Or  .  phenol (CHLORASEPTIC) mouth spray 1 spray  1 spray Mouth/Throat PRN Lanae Crumbly, PA-C      . metoCLOPramide (REGLAN) tablet 5-10 mg  5-10 mg Oral Q8H PRN Lanae Crumbly, PA-C       Or  . metoCLOPramide (REGLAN) injection 5-10 mg  5-10 mg Intravenous Q8H PRN Lanae Crumbly, PA-C      . metoprolol tartrate (LOPRESSOR) tablet 25 mg  25 mg Oral BID Bonnielee Haff, MD   25 mg at 06/28/15 0845  . ondansetron (ZOFRAN) tablet 4 mg  4 mg Oral Q6H PRN Lanae Crumbly, PA-C       Or  . ondansetron [facility]) injection 4 mg  4 mg Intravenous Q6H PRN Lanae Crumbly, PA-C      . polyethylene glycol [facility] / GLYCOLAX) packet 17 g  17 g Oral Daily PRN Lanae Crumbly, PA-C      . rivaroxaban [facility]) tablet 20 mg  20 mg Oral Q supper Lanae Crumbly, PA-C   20 mg at 06/27/15 2157   Do not use this list as official medication orders. Please verify with discharge summary.  Discharge Medications:   Medication List    ASK your doctor about these medications        docusate sodium 100 MG capsule  Commonly known as:  COLACE  Take 1 capsule (100 mg total) by mouth 2 (two) times daily.     feeding supplement (ENSURE) Pudg  Take 1 Container by mouth 2 (two) times daily between meals.     glimepiride 2 MG tablet  Commonly known as:  AMARYL  Take 1 tablet (2 mg total) by mouth daily.     HYDROcodone-acetaminophen 5-325 MG tablet  Commonly known as:  NORCO/VICODIN  Take one tablet by mouth every 4 hours as needed for moderate pain. Do not exceed 4gm of Tylenol in 24 hours     metFORMIN 500 MG tablet  Commonly known as:  GLUCOPHAGE  Take 1 tablet (500 mg total) by mouth daily.     metoprolol tartrate 25 MG tablet  Commonly known as:  LOPRESSOR  Take 1 tablet (25 mg total) by mouth 2 (two) times daily.     mometasone 50 MCG/ACT nasal spray  Commonly known as:  NASONEX  2 sprays per nostril qd     OSCAL 500/200 D-3 500-200 MG-UNIT tablet  Generic drug:  calcium-vitamin D  Take 1 tablet by mouth daily with  breakfast.     predniSONE 5 MG tablet  Commonly known as:  DELTASONE  TAKE ONE TABLET BY MOUTH DAILY WITH BREAKFAST     rivaroxaban 20 MG Tabs tablet  Commonly known as:  XARELTO  TAKE ONE TABLET BY MOUTH ONE TIME DAILY WITH SUPPER     sulfamethoxazole-trimethoprim 800-160 MG tablet  Commonly known as:  BACTRIM DS  Take 1 tablet by mouth 2 (two) times daily.  Relevant Imaging Results:  Relevant Lab Results:  Recent Labs    Additional Information Social Security #: [id number]  Luna Kitchens [phone]

## 2015-06-28 NOTE — Progress Notes (Signed)
[facility] PROGRESS NOTE  Elizabeth Drake [id number] DOB: 04-May-1941 DOA: 06/25/2015  PCP: Chevis Pretty, FNP  Brief HPI: 74 year old Caucasian female with a past medical history of sarcoidosis on chronic prednisone, paroxysmal atrial fibrillation in February 2016, type 2 diabetes, history of an embolism on anticoagulation, recent right leg cellulitis for which she was placed on Bactrim presented after what appears to be a mechanical fall resulting in left hip fracture. There was also a nondisplaced fracture of the left superior pubic ramus. Patient was hospitalized for further management.  Past medical history:  Past Medical History  Diagnosis Date  . Sarcoidosis ([place])   . Hiatal hernia   . Diabetes mellitus without complication ([place])   . Osteoporosis   . DVT (deep venous thrombosis) ([place])   . Pulmonary emboli [facility])     Consultants: Orthopedics. Cardiology.  Procedures: ORIF left hip . 11/21  Antibiotics: Patient completed her course of Bactrim.   Subjective: Patient feels well. Some pain in the left hip, especially with movement. Denies any chest pain or shortness of breath. At home she is wheelchair-bound and has very low functional opacity.  Objective: Vital Signs  Filed Vitals:   06/27/15 1842 06/27/15 1956 06/28/15 0030 06/28/15 0421  BP: 133/58 121/51 121/49 118/41  Pulse:  86 79 72  Temp:  98.7 F (37.1 C) 98.4 F (36.9 C) 98.4 F (36.9 C)  TempSrc:  Oral Oral Oral  Resp: 13 16    Height:      Weight:      SpO2:  100% 99% 96%    Intake/Output Summary (Last 24 hours) at 06/28/15 [id number] Last data filed at 06/28/15 0032  Gross per 24 hour  Intake    900 ml  Output   1500 ml  Net   -600 ml   Filed Weights   06/25/15 1718  Weight: 49.896 kg (110 lb)    General appearance: alert, cooperative, appears stated age Resp: clear to auscultation bilaterally Cardio: regular rate and rhythm, S1, S2 normal, no murmur, click, rub or gallop GI:  soft, non-tender; bowel sounds normal; no masses,  no organomegaly Extremities: No significant swelling or bruising noted over the left thigh area. Neurologic: Awake and alert. Oriented 3. No focal neurological deficits noted.  Lab Results:  Basic Metabolic Panel:  Recent Labs Lab 06/25/15 1951 06/27/15 0608 06/28/15 0452  NA 141 137 134*  K 4.1 4.2 4.2  CL 105 103 103  CO2 28 28 26   GLUCOSE 99 141* 182*  BUN 14 19 14   CREATININE 0.71 0.96 0.76  CALCIUM 10.5* 9.6 9.1   CBC:  Recent Labs Lab 06/25/15 2040 06/27/15 0608 06/28/15 0452  WBC 13.7* 9.2 8.9  NEUTROABS 10.6*  --   --   HGB 12.5 10.4* 9.0*  HCT 38.8 33.2* 27.0*  MCV 99.0 97.9 97.1  PLT 234 175 145*   CBG:  Recent Labs Lab 06/27/15 1801 06/27/15 1853 06/27/15 2000 06/28/15 0035 06/28/15 0425  GLUCAP 158* 159* 211* 212* 167*    Recent Results (from the past 240 hour(s))  Surgical pcr screen     Status: None   Collection Time: 06/26/15 12:24 AM  Result Value Ref Range Status   MRSA, PCR NEGATIVE NEGATIVE Final   Staphylococcus aureus NEGATIVE NEGATIVE Final    Comment:        The Xpert SA Assay (FDA approved for NASAL specimens in patients over 56 years of age), is one component of a comprehensive surveillance program.  Test  performance has been validated by [facility] for patients greater than or equal to 52 year old. It is not intended to diagnose infection nor to guide or monitor treatment.   MRSA PCR Screening     Status: None   Collection Time: 06/27/15  5:25 AM  Result Value Ref Range Status   MRSA by PCR NEGATIVE NEGATIVE Final    Comment:        The GeneXpert MRSA Assay (FDA approved for NASAL specimens only), is one component of a comprehensive MRSA colonization surveillance program. It is not intended to diagnose MRSA infection nor to guide or monitor treatment for MRSA infections.       Studies/Results: Dg C-arm 1-60 Min  06/27/2015  CLINICAL DATA:  74 year old  female undergoing ORIF of a left intertrochanteric femoral fracture EXAM: DG C-ARM 61-120 MIN; LEFT FEMUR 2 VIEWS COMPARISON:  Preoperative CT images 1119 2016 FINDINGS: A total of 4 intraoperative spot radiographs demonstrate fixation of the intertrochanteric fracture with an intra medullary nail including a single distal interlocking screw and a transfemoral neck cannulated lag screw. Alignment of the fracture fragments appears anatomic. No evidence of immediate hardware complication. IMPRESSION: ORIF of intertrochanteric left femoral fracture as described above. Electronically Signed   By: Jacqulynn Cadet M.D.   On: 06/27/2015 19:21   Dg Femur Min 2 Views Left  06/27/2015  CLINICAL DATA:  74 year old female undergoing ORIF of a left intertrochanteric femoral fracture EXAM: DG C-ARM 61-120 MIN; LEFT FEMUR 2 VIEWS COMPARISON:  Preoperative CT images 1119 2016 FINDINGS: A total of 4 intraoperative spot radiographs demonstrate fixation of the intertrochanteric fracture with an intra medullary nail including a single distal interlocking screw and a transfemoral neck cannulated lag screw. Alignment of the fracture fragments appears anatomic. No evidence of immediate hardware complication. IMPRESSION: ORIF of intertrochanteric left femoral fracture as described above. Electronically Signed   By: Jacqulynn Cadet M.D.   On: 06/27/2015 19:21    Medications:  Scheduled: . docusate sodium  100 mg Oral BID  . feeding supplement  1 Container Oral BID BM  . hydrocortisone sod succinate (SOLU-CORTEF) inj  50 mg Intravenous Q8H  . Influenza vac split quadrivalent PF  0.5 mL Intramuscular Tomorrow-1000  . insulin aspart  0-9 Units Subcutaneous 6 times per day  . metoprolol tartrate  25 mg Oral BID  . 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 Orthopedics is following. Start mobilizing as per. Pain control. Remains medically stable.  History of paroxysmal atrial fibrillation This occurred in the setting of cellulitis earlier this year. Currently in sinus rhythm. Echocardiogram showed normal systolic function without wall motion abnormalities. Patient is on anticoagulation, which was held due to need for surgical intervention. Continue her beta blocker. Anticoagulation has been resumed. Cardiology had seen the patient for abnormal EKG. They cleared the patient for surgery.  History of pulmonary embolism This was the  original reason for her being on anticoagulation. Currently stable. Xarelto has been resumed.  History of type 2 diabetes Oral agents are on hold. Continue sliding scale coverage. High blood sugars, likely secondary to steroids. Should become better controlled as steroid is tapered.  History of sarcoidosis on chronic steroids She has been on oral prednisone for many years. Patient was started on hydrocortisone prior to surgery for stress dosing. Will decrease dose today. Anticipate change to oral prednisone tomorrow.   Recent right lower extremity cellulitis She was on oral Bactrim for same. She has completed the course of antibiotics. No evidence for active infection in the right leg.  Normocytic anemia Hemoglobin noted to be lower today compared to yesterday. We  will recheck tomorrow morning. Continue to monitor.  Severe malnutrition in the setting of chronic illness Seen by dietitian. On nutritional supplements.  DVT Prophylaxis: SCDs    Code Status: Full code  Family Communication: Discussed with the patient Disposition Plan: Await PT and OT evaluation. Will likely need SNF placement.      LOS: 3 days   [facility] Pager [phone] 06/28/2015, 8:12 AM  If 7PM-7AM, please contact night-coverage at [email or web address], password [facility]

## 2015-06-28 NOTE — Evaluation (Signed)
Physical Therapy Evaluation Patient Details Name: Elizabeth Drake MRN: [id number] DOB: [birth year] Today's Date: 06/28/2015   History of Present Illness  Pt is 74 y.o. female that underwent IM nail to fix L superior pubic ramus fx. Pt also is severely deconditioned. PMHx includes paroxysmal atrial fib in Feb 2016, pulmonary embolism, diabetes, hypothyroidism, HTN, sarcoidosis on chronic prednisone and overactive bladder.   Clinical Impression  Pt reported mild pain in L hip upon arrival. She presents with significant global weakness, decreased trunk and head control, and limited capacity for functional mobility. She would benefit from skilled PT to improve strength, balance, safety during transfers and to decrease pain. Pt was educated on safety with transfers and HEP. D/C to SNF is the most appropriate POC at this time, pt verbally agreed.      Follow Up Recommendations SNF    Equipment Recommendations  Wheelchair (measurements PT)    Recommendations for Other Services OT consult     Precautions / Restrictions Restrictions Weight Bearing Restrictions: Yes LLE Weight Bearing: Weight bearing as tolerated      Mobility  Bed Mobility Overal bed mobility: Needs Assistance;+2 for physical assistance Bed Mobility: Supine to Sit     Supine to sit: Max assist;+2 for physical assistance     General bed mobility comments: Max assist required for trunk elevation, pivoting, and to lift legs off bed  Transfers Overall transfer level: Needs assistance Equipment used: 2 person hand held assist Transfers: Squat Pivot Transfers     Squat pivot transfers: Total assist;+2 physical assistance     General transfer comment: Pt unable to WB through feet or push up with hands. 2 persons to lift with bed pad into nearby chair  Ambulation/Gait                Stairs            Wheelchair Mobility    Modified Rankin (Stroke Patients Only)       Balance Overall balance  assessment: Needs assistance Sitting-balance support: Bilateral upper extremity supported;Feet supported Sitting balance-Leahy Scale: Poor Sitting balance - Comments: Unable to sit up straight with head up, used bed to maintain balance       Standing balance comment: Unable to stand pt                             Pertinent Vitals/Pain Pain Assessment: 0-10 Pain Score: 8  Pain Descriptors / Indicators: Aching Pain Intervention(s): Limited activity within patient's tolerance;Monitored during session;Repositioned    Home Living Family/patient expects to be discharged to:: Private residence Living Arrangements: Spouse/significant other;Children Available Help at Discharge: Family;Available PRN/intermittently Type of Home: House Home Access: Ramped entrance     Home Layout: Two level;Able to live on main level with bedroom/bathroom;Laundry or work area in [place]: [place] - power;Shower seat - built in;Grab bars - tub/shower;Hand held shower head Additional Comments: pt's son lives with she and her husband but works full time. Her husband is 56 yo. They have a chair lift to the basement.    Prior Function Level of Independence: Independent with assistive device(s)         Comments: mod I from WC level     Hand Dominance   Dominant Hand: Right    Extremity/Trunk Assessment   Upper Extremity Assessment: Defer to OT evaluation           Lower Extremity Assessment: Generalized weakness  Cervical / Trunk Assessment: Kyphotic (pt unable to sit up straight on EOB)  Communication   Communication: No difficulties  Cognition Arousal/Alertness: Awake/alert Behavior During Therapy: WFL for tasks assessed/performed Overall Cognitive Status: Within Functional Limits for tasks assessed                      General Comments General comments (skin integrity, edema, etc.): Ecchymosis on L LE, pt reported it was  from wheelchair preadmission    Exercises General Exercises - Lower Extremity Ankle Circles/Pumps: AROM;Both;10 reps;Seated Quad Sets: AROM;Left;10 reps;Seated Gluteal Sets: AROM;Both;10 reps      Assessment/Plan    PT Assessment Patient needs continued PT services  PT Diagnosis Difficulty walking;Generalized weakness;Acute pain   PT Problem List Decreased strength;Decreased range of motion;Decreased activity tolerance;Decreased balance;Decreased mobility;Decreased safety awareness;Decreased knowledge of use of DME;Pain  PT Treatment Interventions DME instruction;Functional mobility training;Therapeutic exercise;Patient/family education   PT Goals (Current goals can be found in the Care Plan section) Acute Rehab PT Goals Patient Stated Goal: Return home PT Goal Formulation: With patient Time For Goal Achievement: 07/05/15 Potential to Achieve Goals: Fair    Frequency Min 3X/week   Barriers to discharge Decreased caregiver support Husband is 6 yo and son is unable to be home for 24hr assistance    Co-evaluation               End of Session Equipment Utilized During Treatment: Gait belt;Oxygen Activity Tolerance: Patient limited by fatigue Patient left: in chair;with call bell/phone within reach;with chair alarm set Nurse Communication: Mobility status;Weight bearing status         Time: [id number] PT Time Calculation (min) (ACUTE ONLY): 27 min   Charges:   PT Evaluation $Initial PT Evaluation Tier I: 1 Procedure PT Treatments $Therapeutic Activity: 8-22 mins   PT G CodesHaynes Bast Jul 03, 2015, 10:53 AM Haynes Bast, SPT 07-03-2015 10:57 AM

## 2015-06-28 NOTE — Clinical Social Work Note (Signed)
Clinical Social Work Assessment  Patient Details  Name: Elizabeth Drake MRN: [id number] Date of Birth: [birth year]  Date of referral:  06/28/15               Reason for consult:  Facility Placement, Discharge Planning                Permission sought to share information with:  Chartered certified accountant granted to share information::  Yes, Verbal Permission Granted  Name::        Agency::  [facility] (preference for [employer])  Relationship::     Contact Information:     Housing/Transportation Living arrangements for the past 2 months:  [place] of Information:  Patient Patient Interpreter Needed:  None Criminal Activity/Legal Involvement Pertinent to Current Situation/Hospitalization:  No - Comment as needed Significant Relationships:  Spouse Lives with:  Spouse Do you feel safe going back to the place where you live?  No (High fall risk.) Need for family participation in patient care:  No (Coment) (Patient able to make own decisions.)  Care giving concerns:  Patient expressed no concerns at this time.   Social Worker assessment / plan:  CSW received referral for possible SNF placement at time of discharge. CSW met with patient at bedside to discuss discharge planning needs. Per patient, patient has previously completed short-term rehabilitation at [facility] and would prefer to return at time of discharge. CSW to continue to follow and assist with discharge planning needs.  Employment status:  Retired Forensic scientist:  Medicare PT Recommendations:  [place] / Referral to community resources:  [place]  Patient/Family's Response to care:  Patient understanding and agreeable to CSW plan of care.  Patient/Family's Understanding of and Emotional Response to Diagnosis, Current Treatment, and Prognosis:  Patient understanding and agreeable to CSW plan of care.  Emotional  Assessment Appearance:  Appears stated age Attitude/Demeanor/Rapport:  Other (Pleasant.) Affect (typically observed):  Accepting, Appropriate, Pleasant Orientation:  Oriented to Self, Oriented to Place, Oriented to  Time, Oriented to Situation Alcohol / Substance use:  Not Applicable Psych involvement (Current and /or in the community):  No (Comment) (Not appropriate on this admission.)  Discharge Needs  Concerns to be addressed:  No discharge needs identified Readmission within the last 30 days:  No Current discharge risk:  None Barriers to Discharge:  No Barriers Identified   Caroline Sauger, LCSW 06/28/2015, 3:21 PM [phone]

## 2015-06-29 LAB — BASIC METABOLIC PANEL
ANION GAP: 3 — AB (ref 5–15)
BUN: 14 mg/dL (ref 6–20)
CALCIUM: 8.8 mg/dL — AB (ref 8.9–10.3)
CO2: 27 mmol/L (ref 22–32)
CREATININE: 0.78 mg/dL (ref 0.44–1.00)
Chloride: 106 mmol/L (ref 101–111)
GFR calc non Af Amer: >60 mL/min (ref >60)
Glucose, Bld: 198 mg/dL — ABNORMAL HIGH (ref 65–99)
Potassium: 4.3 mmol/L (ref 3.5–5.1)
SODIUM: 136 mmol/L (ref 135–145)

## 2015-06-29 LAB — GLUCOSE, CAPILLARY
GLUCOSE-CAPILLARY: 178 mg/dL — AB (ref 65–99)
GLUCOSE-CAPILLARY: 181 mg/dL — AB (ref 65–99)
Glucose-Capillary: 118 mg/dL — ABNORMAL HIGH (ref 65–99)
Glucose-Capillary: 132 mg/dL — ABNORMAL HIGH (ref 65–99)
Glucose-Capillary: 190 mg/dL — ABNORMAL HIGH (ref 65–99)
Glucose-Capillary: 243 mg/dL — ABNORMAL HIGH (ref 65–99)

## 2015-06-29 LAB — CBC
HEMATOCRIT: 23.6 % — AB (ref 36.0–46.0)
HEMATOCRIT: 29.1 % — AB (ref 36.0–46.0)
HEMOGLOBIN: 7.8 g/dL — AB (ref 12.0–15.0)
HEMOGLOBIN: 9.7 g/dL — AB (ref 12.0–15.0)
MCH: 32.2 pg (ref 26.0–34.0)
MCH: 32.2 pg (ref 26.0–34.0)
MCHC: 33.1 g/dL (ref 30.0–36.0)
MCHC: 33.3 g/dL (ref 30.0–36.0)
MCV: 97.5 fL (ref 78.0–100.0)
MCV: 96.7 fL (ref 78.0–100.0)
Platelets: 138 10*3/uL — ABNORMAL LOW (ref 150–400)
Platelets: 153 10*3/uL (ref 150–400)
RBC: 2.42 MIL/uL — ABNORMAL LOW (ref 3.87–5.11)
RBC: 3.01 MIL/uL — ABNORMAL LOW (ref 3.87–5.11)
RDW: 12.1 % (ref 11.5–15.5)
RDW: 13 % (ref 11.5–15.5)
WBC: 6 10*3/uL (ref 4.0–10.5)
WBC: 7.2 10*3/uL (ref 4.0–10.5)

## 2015-06-29 LAB — PREPARE RBC (CROSSMATCH)

## 2015-06-29 MED ORDER — SENNA 8.6 MG PO TABS
2.0000 | ORAL_TABLET | Freq: Every day | ORAL | Status: DC
  Administered 2015-06-29: 17.2 mg via ORAL
  Filled 2015-06-29: qty 2

## 2015-06-29 MED ORDER — HYDROCORTISONE NA SUCCINATE PF 100 MG IJ SOLR
25.0000 mg | Freq: Two times a day (BID) | INTRAMUSCULAR | Status: DC
  Administered 2015-06-29 – 2015-06-30 (×3): 25 mg via INTRAVENOUS
  Filled 2015-06-29 (×5): qty 0.5

## 2015-06-29 MED ORDER — ACETAMINOPHEN 325 MG PO TABS
650.0000 mg | ORAL_TABLET | Freq: Once | ORAL | Status: AC
  Administered 2015-06-29: 650 mg via ORAL
  Filled 2015-06-29: qty 2

## 2015-06-29 MED ORDER — SENNA 8.6 MG PO TABS: 2.0000 | ORAL_TABLET | Freq: Every day | ORAL | Status: AC

## 2015-06-29 MED ORDER — OXYCODONE HCL 5 MG PO TABS
5.0000 mg | ORAL_TABLET | ORAL | Status: DC | PRN
  Administered 2015-06-29 – 2015-06-30 (×5): 5 mg via ORAL
  Filled 2015-06-29 (×5): qty 1

## 2015-06-29 MED ORDER — POLYETHYLENE GLYCOL 3350 17 G PO PACK
17.0000 g | PACK | Freq: Every day | ORAL | Status: DC
  Administered 2015-06-29 – 2015-06-30 (×2): 17 g via ORAL
  Filled 2015-06-29: qty 1

## 2015-06-29 MED ORDER — ACETAMINOPHEN 500 MG PO TABS: 1000.0000 mg | ORAL_TABLET | Freq: Three times a day (TID) | ORAL | Status: AC

## 2015-06-29 MED ORDER — FUROSEMIDE 10 MG/ML IJ SOLN
20.0000 mg | Freq: Once | INTRAMUSCULAR | Status: AC
  Administered 2015-06-29: 20 mg via INTRAVENOUS
  Filled 2015-06-29: qty 2

## 2015-06-29 MED ORDER — BOOST / RESOURCE BREEZE PO LIQD: 1.0000 | Freq: Two times a day (BID) | ORAL | Status: AC

## 2015-06-29 MED ORDER — INSULIN ASPART 100 UNIT/ML ~~LOC~~ SOLN: SUBCUTANEOUS | Status: DC

## 2015-06-29 MED ORDER — DIPHENHYDRAMINE HCL 50 MG/ML IJ SOLN
25.0000 mg | Freq: Once | INTRAMUSCULAR | Status: AC
  Administered 2015-06-29: 25 mg via INTRAVENOUS
  Filled 2015-06-29: qty 1

## 2015-06-29 MED ORDER — HYDROCODONE-ACETAMINOPHEN 5-325 MG PO TABS: 1.0000 | ORAL_TABLET | ORAL | Status: DC | PRN

## 2015-06-29 MED ORDER — DIPHENHYDRAMINE HCL 25 MG PO CAPS: 25.0000 mg | ORAL_CAPSULE | Freq: Four times a day (QID) | ORAL | Status: DC | PRN

## 2015-06-29 MED ORDER — OXYCODONE HCL 5 MG PO TABS: 5.0000 mg | ORAL_TABLET | ORAL | Status: DC | PRN

## 2015-06-29 MED ORDER — POLYETHYLENE GLYCOL 3350 17 G PO PACK: 17.0000 g | PACK | Freq: Every day | ORAL | Status: AC | PRN

## 2015-06-29 MED ORDER — SODIUM CHLORIDE 0.9 % IV SOLN
Freq: Once | INTRAVENOUS | Status: AC
  Administered 2015-06-29: 10:00:00 via INTRAVENOUS

## 2015-06-29 MED ORDER — ACETAMINOPHEN 500 MG PO TABS
1000.0000 mg | ORAL_TABLET | Freq: Three times a day (TID) | ORAL | Status: DC
  Administered 2015-06-29 – 2015-06-30 (×3): 1000 mg via ORAL
  Filled 2015-06-29 (×3): qty 2

## 2015-06-29 NOTE — Clinical Social Work Note (Signed)
Bed available at [facility] once patient medically stable for discharge.  Lubertha Sayres, [facility]: [phone] Surgical: [phone]

## 2015-06-29 NOTE — Progress Notes (Signed)
Subjective: Doing well. Pain controlled. Awaiting discharge planning.   Objective: Vital signs in last 24 hours: Temp:  [98 F (36.7 C)-98.6 F (37 C)] 98.6 F (37 C) (11/23 0435) Pulse Rate:  [74-96] 74 (11/23 0435) Resp:  [16] 16 (11/23 0435) BP: (99-130)/(27-56) 130/56 mmHg (11/23 0435) SpO2:  [96 %] 96 % (11/23 0435)  Intake/Output from previous day: 11/22 0701 - 11/23 0700 In: 960 [P.O.:960] Out: -  Intake/Output this shift:     Recent Labs  06/27/15 0608 06/28/15 0452 06/29/15 0549  HGB 10.4* 9.0* 7.8*    Recent Labs  06/28/15 0452 06/29/15 0549  WBC 8.9 6.0  RBC 2.78* 2.42*  HCT 27.0* 23.6*  PLT 145* 138*    Recent Labs  06/28/15 0915 06/29/15 0549  NA 135 136  K 4.1 4.3  CL 105 106  CO2 24 27  BUN 14 14  CREATININE 0.89 0.78  GLUCOSE 271* 198*  CALCIUM 8.9 8.8*   No results for input(s): LABPT, INR in the last 72 hours.  Exam:  Alert and oriented.  Wounds look good.  Staples intact.  No signs of infection. bilat calves nontender.   Assessment/Plan: Transfer to SNF when bed available and cleared by medicine.  Patient on xarelto for dvt prophylaxis and script given for Norco for pain. Will need f/u with Dr Lorin Mercy 2 weeks postop.      Elizabeth Drake M 06/29/2015, 9:19 AM

## 2015-06-29 NOTE — Progress Notes (Signed)
PATIENT DETAILS Name: Elizabeth Drake Age: 74 y.o. Sex: female Date of Birth: 1941-05-11 Admit Date: 06/25/2015 Admitting Physician Etta Quill, DO PO:[id number] MARGARET, FNP  Subjective: Mild pain at left hip area.  Assessment/Plan: Left hip fracture along with nondisplaced fracture of the left superior pubic ramus: secondary to a mechanical fall. Seen by Orthopedics, underwent Left intramedullary trochanteric nail on 11/21-doing well post-operatively. Seen by PT-recommendation as for SNF. Per Orthopedics-[facility], and to follow up with them in 2 weeks. On chronic Xarelto-which will cover VTE prophylaxis.   History of paroxysmal atrial fibrillation:This occurred in the setting of cellulitis earlier this year. Currently in sinus rhythm. Echocardiogram showed normal systolic function without wall motion abnormalities. Patient is on anticoagulation with Xarelto-which was held due to need for surgical intervention. Continue her beta blocker. Anticoagulation has been resumed post-operatively.  History of pulmonary embolism:This was the original reason for her being on anticoagulation. Currently stable. Xarelto has been resumed.  History of type 2 diabetes:CBG's stable with SSI while inpatient-oral agents will be resumed on discharge  Recent right lower extremity cellulitis:She was on oral Bactrim for same. She has completed the course of antibiotics. No evidence for active infection in the right leg.  Anemia:Likely post operative blood loss anemia-transfused 1 unit of PRBC on 11/23-recheck CBC in am  Sarcoidosis: initially on stress dose steroids-continue to taper-will transition to her usual regimen of prednisone on discharge.   Severe malnutrition in the setting of chronic illness:Seen by dietitian. On nutritional supplements  Disposition: Remain inpatient-SNF 11/24  Antimicrobial agents  See below  Anti-infectives    Start     Dose/Rate Route Frequency Ordered  Stop   06/27/15 1200  vancomycin (VANCOCIN) IVPB 1000 mg/200 mL premix     1,000 mg 200 mL/hr over 60 Minutes Intravenous  Once 06/26/15 1445 06/27/15 1630   06/25/15 2230  sulfamethoxazole-trimethoprim (BACTRIM DS,SEPTRA DS) 800-160 MG per tablet 1 tablet     1 tablet Oral 2 times daily 06/25/15 2215 06/28/15 0756      DVT Prophylaxis: Xarelto  Code Status: Full code   Family Communication None at bedside  Procedures: Left intramedullary trochanteric nail on 11/21  CONSULTS:  orthopedic surgery  Time spent 40 minutes-Greater than 50% of this time was spent in counseling, explanation of diagnosis, planning of further management, and coordination of care.  MEDICATIONS: Scheduled Meds: . acetaminophen  1,000 mg Oral Q8H  . feeding supplement  1 Container Oral BID BM  . hydrocortisone sod succinate (SOLU-CORTEF) inj  25 mg Intravenous Q12H  . insulin aspart  0-9 Units Subcutaneous 6 times per day  . metoprolol tartrate  25 mg Oral BID  . polyethylene glycol  17 g Oral Daily  . rivaroxaban  20 mg Oral Q supper  . senna  2 tablet Oral QHS   Continuous Infusions:  PRN Meds:.diphenhydrAMINE, HYDROmorphone (DILAUDID) injection, menthol-cetylpyridinium **OR** phenol, metoCLOPramide **OR** metoCLOPramide (REGLAN) injection, ondansetron **OR** ondansetron (ZOFRAN) IV, oxyCODONE    PHYSICAL EXAM: Vital signs in last 24 hours: Filed Vitals:   06/29/15 1015 06/29/15 1030 06/29/15 1045 06/29/15 1302  BP: 93/43  98/74 109/51  Pulse: 104  87 108  Temp: 97.8 F (36.6 C)  97.5 F (36.4 C) 97.7 F (36.5 C)  TempSrc: Oral  Oral Oral  Resp: 16  16 16   Height:      Weight:      SpO2: 97% 97% 97% 97%  Weight change:  Filed Weights   06/25/15 1718  Weight: 49.896 kg (110 lb)   Body mass index is 17.76 kg/(m^2).   Gen Exam: Awake and alert with clear speech.   Neck: Supple, No JVD.   Chest: B/L Clear.   CVS: S1 S2 Regular, no murmurs.  Abdomen: soft, BS +, non tender,  non distended.  Extremities: no edema, lower extremities warm to touch. Neurologic: Non Focal.   Skin: No Rash.   Wounds: N/A.   Intake/Output from previous day:  Intake/Output Summary (Last 24 hours) at 06/29/15 1341 Last data filed at 06/29/15 1257  Gross per 24 hour  Intake   1055 ml  Output      0 ml  Net   1055 ml     LAB RESULTS: CBC  Recent Labs Lab 06/25/15 2040 06/27/15 0608 06/28/15 0452 06/29/15 0549  WBC 13.7* 9.2 8.9 6.0  HGB 12.5 10.4* 9.0* 7.8*  HCT 38.8 33.2* 27.0* 23.6*  PLT 234 175 145* 138*  MCV 99.0 97.9 97.1 97.5  MCH 31.9 30.7 32.4 32.2  MCHC 32.2 31.3 33.3 33.1  RDW 12.1 12.2 12.1 12.1  LYMPHSABS 2.3  --   --   --   MONOABS 0.7  --   --   --   EOSABS 0.0  --   --   --   BASOSABS 0.0  --   --   --     Chemistries   Recent Labs Lab 06/25/15 1951 06/27/15 0608 06/28/15 0452 06/28/15 0915 06/29/15 0549  NA 141 137 134* 135 136  K 4.1 4.2 4.2 4.1 4.3  CL 105 103 103 105 106  CO2 28 28 26 24 27   GLUCOSE 99 141* 182* 271* 198*  BUN 14 19 14 14 14   CREATININE 0.71 0.96 0.76 0.89 0.78  CALCIUM 10.5* 9.6 9.1 8.9 8.8*    CBG:  Recent Labs Lab 06/28/15 1617 06/28/15 2002 06/29/15 0011 06/29/15 0441 06/29/15 0825  GLUCAP 100* 170* 118* 190* 132*    GFR Estimated Creatinine Clearance: 47.9 mL/min (by C-G formula based on Cr of 0.78).  Coagulation profile  Recent Labs Lab 06/26/15 0225  INR 1.43    Cardiac Enzymes No results for input(s): CKMB, TROPONINI, MYOGLOBIN in the last 168 hours.  Invalid input(s): CK  Invalid input(s): POCBNP No results for input(s): DDIMER in the last 72 hours. No results for input(s): HGBA1C in the last 72 hours. No results for input(s): CHOL, HDL, LDLCALC, TRIG, CHOLHDL, LDLDIRECT in the last 72 hours. No results for input(s): TSH, T4TOTAL, T3FREE, THYROIDAB in the last 72 hours.  Invalid input(s): FREET3 No results for input(s): VITAMINB12, FOLATE, FERRITIN, TIBC, IRON, RETICCTPCT in  the last 72 hours. No results for input(s): LIPASE, AMYLASE in the last 72 hours.  Urine Studies No results for input(s): UHGB, CRYS in the last 72 hours.  Invalid input(s): UACOL, UAPR, USPG, UPH, UTP, UGL, UKET, UBIL, UNIT, UROB, ULEU, UEPI, UWBC, URBC, UBAC, CAST, UCOM, BILUA  MICROBIOLOGY: Recent Results (from the past 240 hour(s))  Surgical pcr screen     Status: None   Collection Time: 06/26/15 12:24 AM  Result Value Ref Range Status   MRSA, PCR NEGATIVE NEGATIVE Final   Staphylococcus aureus NEGATIVE NEGATIVE Final    Comment:        The Xpert SA Assay (FDA approved for NASAL specimens in patients over 66 years of age), is one component of a comprehensive surveillance program.  Test performance has been validated  by [facility] for patients greater than or equal to 20 year old. It is not intended to diagnose infection nor to guide or monitor treatment.   MRSA PCR Screening     Status: None   Collection Time: 06/27/15  5:25 AM  Result Value Ref Range Status   MRSA by PCR NEGATIVE NEGATIVE Final    Comment:        The GeneXpert MRSA Assay (FDA approved for NASAL specimens only), is one component of a comprehensive MRSA colonization surveillance program. It is not intended to diagnose MRSA infection nor to guide or monitor treatment for MRSA infections.     RADIOLOGY STUDIES/RESULTS: Ct Pelvis Wo Contrast  06/25/2015  CLINICAL DATA:  Fall in bathroom. Left-sided sacral and pelvic pain. EXAM: CT PELVIS WITHOUT CONTRAST TECHNIQUE: Multidetector CT imaging of the pelvis was performed following the standard protocol without intravenous contrast. COMPARISON:  None. FINDINGS: Comminuted intertrochanteric left hip fracture is seen. No evidence of dislocation. A nondisplaced fracture of the left superior pubic ramus is also demonstrated. No other pelvic fractures identified. No evidence of pelvic joint diastasis. No evidence of intrapelvic mass, hematoma, or free fluid.  Prior hysterectomy noted. Sigmoid diverticulosis is demonstrated, without evidence of diverticulitis. Multiple small calculi incidentally noted with the urinary bladder. IMPRESSION: Comminuted intertrochanteric left hip fracture. Nondisplaced fracture of left superior pubic ramus. Electronically Signed   By: Earle Gell M.D.   On: 06/25/2015 18:44   Dg C-arm 1-60 Min  06/27/2015  CLINICAL DATA:  74 year old female undergoing ORIF of a left intertrochanteric femoral fracture EXAM: DG C-ARM 61-120 MIN; LEFT FEMUR 2 VIEWS COMPARISON:  Preoperative CT images 1119 2016 FINDINGS: A total of 4 intraoperative spot radiographs demonstrate fixation of the intertrochanteric fracture with an intra medullary nail including a single distal interlocking screw and a transfemoral neck cannulated lag screw. Alignment of the fracture fragments appears anatomic. No evidence of immediate hardware complication. IMPRESSION: ORIF of intertrochanteric left femoral fracture as described above. Electronically Signed   By: Jacqulynn Cadet M.D.   On: 06/27/2015 19:21   Dg Femur Min 2 Views Left  06/27/2015  CLINICAL DATA:  74 year old female undergoing ORIF of a left intertrochanteric femoral fracture EXAM: DG C-ARM 61-120 MIN; LEFT FEMUR 2 VIEWS COMPARISON:  Preoperative CT images 1119 2016 FINDINGS: A total of 4 intraoperative spot radiographs demonstrate fixation of the intertrochanteric fracture with an intra medullary nail including a single distal interlocking screw and a transfemoral neck cannulated lag screw. Alignment of the fracture fragments appears anatomic. No evidence of immediate hardware complication. IMPRESSION: ORIF of intertrochanteric left femoral fracture as described above. Electronically Signed   By: Jacqulynn Cadet M.D.   On: 06/27/2015 19:21    Oren Binet, MD  [facility] Pager:[phone]  If 7PM-7AM, please contact night-coverage [email or web address] Password [email or web address] 06/29/2015, 1:41 PM    LOS: 4 days

## 2015-06-29 NOTE — Progress Notes (Signed)
Physical Therapy Treatment Patient Details Name: Elizabeth Drake MRN: [id number] DOB: [birth year] Today's Date: 06/29/2015    History of Present Illness Pt is 74 y.o. female that underwent IM nail to fix L superior pubic ramus fx. Pt also is severely deconditioned. PMHx includes paroxysmal atrial fib in Feb 2016, pulmonary embolism, diabetes, hypothyroidism, HTN, sarcoidosis on chronic prednisone and overactive bladder.     PT Comments    Patient progressing slowly toward PT goals. Overall mobility level is max A for bed mobility and max A X 2 for transfers. Continue to progress patient as tolerated with anticipation of d/c to SNF.   Follow Up Recommendations  SNF     Equipment Recommendations  Wheelchair (measurements PT)    Recommendations for Other Services       Precautions / Restrictions Restrictions Weight Bearing Restrictions: Yes LLE Weight Bearing: Weight bearing as tolerated    Mobility  Bed Mobility Overal bed mobility: Needs Assistance;+2 for physical assistance Bed Mobility: Supine to Sit     Supine to sit: Max assist     General bed mobility comments: Max assist required for trunk elevation, pivoting, and getting legs to EOB  Transfers Overall transfer level: Needs assistance Equipment used: 1 person hand held assist Transfers: Squat Pivot Transfers     Squat pivot transfers: Total assist;+2 physical assistance     General transfer comment: squat pivot to [facility] and to recliner with one person guiding hips and one person performing transfer; pt unable to WB through extremities and needs cues for sitting upright   Ambulation/Gait                 Stairs            Wheelchair Mobility    Modified Rankin (Stroke Patients Only)       Balance Overall balance assessment: Needs assistance Sitting-balance support: Bilateral upper extremity supported;Feet supported Sitting balance-Leahy Scale: Poor Sitting balance - Comments: Unable to sit  up straight with head up, used bed to maintain balance                            Cognition Arousal/Alertness: Awake/alert Behavior During Therapy: WFL for tasks assessed/performed Overall Cognitive Status: Within Functional Limits for tasks assessed                      Exercises      General Comments        Pertinent Vitals/Pain Pain Assessment: Faces Faces Pain Scale: Hurts even more Pain Descriptors / Indicators: Aching Pain Intervention(s): Limited activity within patient's tolerance;Monitored during session;Premedicated before session    Home Living                      Prior Function            PT Goals (current goals can now be found in the care plan section) Acute Rehab PT Goals Patient Stated Goal: Return home PT Goal Formulation: With patient Time For Goal Achievement: 07/05/15 Potential to Achieve Goals: Fair    Frequency  Min 3X/week    PT Plan Current plan remains appropriate    Co-evaluation             End of Session Equipment Utilized During Treatment: Gait belt Activity Tolerance: Patient limited by fatigue Patient left: in chair;with call bell/phone within reach;with chair alarm set     Time: [id number] PT Time Calculation (  min) (ACUTE ONLY): 31 min  Charges:  $Therapeutic Activity: 23-37 mins                    G CodesDarliss Cheney, PTA [phone] 06/29/2015, 11:02 AM

## 2015-06-29 NOTE — Discharge Summary (Addendum)
PATIENT DETAILS Name: Elizabeth Drake Age: 74 y.o. Sex: female Date of Birth: July 31, 1941 MRN: [id number]. Admitting Physician: Etta Quill, DO MD:[id number] MARGARET, FNP  Admit Date: 06/25/2015 Discharge date: 06/30/2015  Recommendations for Outpatient Follow-up:  1. Please ensure follow up with Orthopedics 2. 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 Sarcoidosis ([place])   . Hiatal hernia   . Diabetes mellitus without complication ([place])   . Osteoporosis   . DVT (deep venous thrombosis) ([place])   . Pulmonary emboli (HCC)     DISCHARGE MEDICATIONS: Current Discharge Medication List    START taking these medications   Details  acetaminophen (TYLENOL) 500 MG tablet Take 2 tablets (1,000 mg total) by mouth every 8 (eight) hours.    feeding supplement (BOOST / RESOURCE BREEZE) LIQD Take 1 Container by mouth 2 (two) times daily between meals. Refills: 0    insulin aspart (NOVOLOG) 100 UNIT/ML injection insulin aspart (novoLOG) injection 0-9 Units 0-9 Units, Subcutaneous, Every 4 hours (6 times per day) CBG < 70: implement hypoglycemia protocol CBG 70 - 120: 0 units CBG 121 - 150: 1 unit CBG 151 - 200: 2 units CBG 201 - 250: 3 units CBG 251 - 300: 5 units CBG 301 - 350: 7 units CBG 351 - 400: 9 units CBG > 400: call MD    oxyCODONE (OXY IR/ROXICODONE) 5 MG immediate release tablet Take 1 tablet (5 mg total) by mouth every 4 (four) hours as needed for moderate pain. Qty: 30 tablet, Refills: 0    polyethylene glycol (MIRALAX / GLYCOLAX) packet Take 17 g by mouth daily as needed.    senna (SENOKOT) 8.6 MG TABS tablet Take 2 tablets (17.2 mg total) by mouth at bedtime.      CONTINUE these medications which have NOT CHANGED   Details  calcium-vitamin D (OSCAL 500/200 D-3) 500-200 MG-UNIT per tablet Take 1 tablet by mouth daily with breakfast.    glimepiride (AMARYL) 2 MG tablet Take 1 tablet (2 mg total) by mouth daily. Qty: 30 tablet, Refills: 5   Associated Diagnoses: Type 2 diabetes mellitus with complication (HCC)    metFORMIN (GLUCOPHAGE) 500 MG tablet Take 1 tablet (500 mg total) by mouth daily. Qty: 30 tablet, Refills: 5   Associated Diagnoses: Type 2 diabetes mellitus with complication (HCC)    metoprolol tartrate (LOPRESSOR) 25 MG tablet Take 1 tablet (25 mg total) by mouth 2 (two) times daily. Qty: 60 tablet, Refills: 3   Associated Diagnoses: Essential hypertension    predniSONE (DELTASONE) 5 MG tablet TAKE ONE TABLET BY MOUTH DAILY WITH BREAKFAST Qty: 30 tablet, Refills: 2    rivaroxaban (XARELTO) 20 MG TABS tablet TAKE ONE TABLET BY MOUTH ONE TIME DAILY WITH SUPPER Qty: 30 tablet, Refills: 5   Associated Diagnoses: Atrial fibrillation with RVR (HCC)    mometasone (NASONEX) 50 MCG/ACT nasal spray 2 sprays per nostril qd Qty: 17 g, Refills: 12   Associated Diagnoses: Seasonal allergies      STOP taking these medications     docusate sodium (COLACE) 100 MG capsule  HYDROcodone-acetaminophen (NORCO/VICODIN) 5-325 MG tablet      sulfamethoxazole-trimethoprim (BACTRIM DS) 800-160 MG tablet      feeding supplement, ENSURE, (ENSURE) PUDG         ALLERGIES:   Allergies  Allergen Reactions  . Advil [Ibuprofen] Shortness Of Breath and Swelling  . Propoxyphene Swelling  . Azo [Phenazopyridine] Other (See Comments)    Unknown reaction  . Indocin [Indomethacin] Other (See Comments)    Unknown reaction  . Keflex [Cephalexin] Other (See Comments)  . Loratadine Other (See Comments)    unknown  . Percocet [Oxycodone-Acetaminophen] Itching     BRIEF HPI:  See H&P, Labs, Consult and Test reports for all details in brief, patient is a 74 y.o. female with h/o sarcoidosis on chronic prednisonewho sustained a mechanical fall-further evaluation demonstrated a left hip fracture.   CONSULTATIONS:   orthopedic surgery  PERTINENT RADIOLOGIC STUDIES: Ct Pelvis Wo Contrast  06/25/2015  CLINICAL DATA:  Fall in bathroom. Left-sided sacral and pelvic pain. EXAM: CT PELVIS WITHOUT CONTRAST TECHNIQUE: Multidetector CT imaging of the pelvis was performed following the standard protocol without intravenous contrast. COMPARISON:  None. FINDINGS: Comminuted intertrochanteric left hip fracture is seen. No evidence of dislocation. A nondisplaced fracture of the left superior pubic ramus is also demonstrated. No other pelvic fractures identified. No evidence of pelvic joint diastasis. No evidence of intrapelvic mass, hematoma, or free fluid. Prior hysterectomy noted. Sigmoid diverticulosis is demonstrated, without evidence of diverticulitis. Multiple small calculi incidentally noted with the urinary bladder. IMPRESSION: Comminuted intertrochanteric left hip fracture. Nondisplaced fracture of left superior pubic ramus. Electronically Signed   By: Earle Gell M.D.   On: 06/25/2015 18:44   Dg C-arm 1-60 Min  06/27/2015  CLINICAL DATA:  74 year old female undergoing ORIF of a left intertrochanteric femoral fracture EXAM: DG C-ARM 61-120 MIN; LEFT FEMUR 2 VIEWS COMPARISON:  Preoperative CT images 1119 2016 FINDINGS: A total of 4 intraoperative spot radiographs demonstrate fixation of the intertrochanteric fracture with an intra medullary nail including a single distal interlocking screw and a transfemoral neck cannulated lag screw. Alignment of the fracture fragments appears anatomic. No evidence of immediate hardware complication. IMPRESSION: ORIF of intertrochanteric left femoral fracture as described above. Electronically Signed   By: Jacqulynn Cadet M.D.    On: 06/27/2015 19:21   Dg Femur Min 2 Views Left  06/27/2015  CLINICAL DATA:  74 year old female undergoing ORIF of a left intertrochanteric femoral fracture EXAM: DG C-ARM 61-120 MIN; LEFT FEMUR 2 VIEWS COMPARISON:  Preoperative CT images 1119 2016 FINDINGS: A total of 4 intraoperative spot radiographs demonstrate fixation of the intertrochanteric fracture with an intra medullary nail including a single distal interlocking screw and a transfemoral neck cannulated lag screw. Alignment of the fracture fragments appears anatomic. No evidence of immediate hardware complication. IMPRESSION: ORIF of intertrochanteric left femoral fracture as described above. Electronically Signed   By: Jacqulynn Cadet M.D.   On: 06/27/2015 19:21     PERTINENT LAB RESULTS: CBC:  Recent Labs  06/28/15 0452 06/29/15 0549  WBC 8.9 6.0  HGB 9.0* 7.8*  HCT 27.0* 23.6*  PLT 145* 138*   CMET CMP     Component Value Date/Time   NA 136 06/29/2015 0549   NA 142 04/04/2015 1459   K 4.3 06/29/2015 0549   CL 106 06/29/2015 0549   CO2 27 06/29/2015 0549   GLUCOSE 198* 06/29/2015 0549   GLUCOSE 117* 04/04/2015 1459   BUN 14 06/29/2015 0549   BUN 17 04/04/2015 1459  CREATININE 0.78 06/29/2015 0549   CREATININE 0.62 12/31/2012 1210   CALCIUM 8.8* 06/29/2015 0549   PROT 5.8* 06/28/2015 0915   PROT 6.9 04/04/2015 1459   ALBUMIN 2.3* 06/28/2015 0915   ALBUMIN 3.8 04/04/2015 1459   AST 23 06/28/2015 0915   ALT 17 06/28/2015 0915   ALKPHOS 33* 06/28/2015 0915   BILITOT 0.5 06/28/2015 0915   BILITOT 0.3 04/04/2015 1459   GFRNONAA >60 06/29/2015 0549   GFRNONAA >89 12/31/2012 1210   GFRAA >60 06/29/2015 0549   GFRAA >89 12/31/2012 1210    GFR Estimated Creatinine Clearance: 47.9 mL/min (by C-G formula based on Cr of 0.78). No results for input(s): LIPASE, AMYLASE in the last 72 hours. No results for input(s): CKTOTAL, CKMB, CKMBINDEX, TROPONINI in the last 72 hours. Invalid input(s): POCBNP No results  for input(s): DDIMER in the last 72 hours. No results for input(s): HGBA1C in the last 72 hours. No results for input(s): CHOL, HDL, LDLCALC, TRIG, CHOLHDL, LDLDIRECT in the last 72 hours. No results for input(s): TSH, T4TOTAL, T3FREE, THYROIDAB in the last 72 hours.  Invalid input(s): FREET3 No results for input(s): VITAMINB12, FOLATE, FERRITIN, TIBC, IRON, RETICCTPCT in the last 72 hours. Coags: No results for input(s): INR in the last 72 hours.  Invalid input(s): PT Microbiology: Recent Results (from the past 240 hour(s))  Surgical pcr screen     Status: None   Collection Time: 06/26/15 12:24 AM  Result Value Ref Range Status   MRSA, PCR NEGATIVE NEGATIVE Final   Staphylococcus aureus NEGATIVE NEGATIVE Final    Comment:        The Xpert SA Assay (FDA approved for NASAL specimens in patients over 15 years of age), is one component of a comprehensive surveillance program.  Test performance has been validated by [facility] for patients greater than or equal to 54 year old. It is not intended to diagnose infection nor to guide or monitor treatment.   MRSA PCR Screening     Status: None   Collection Time: 06/27/15  5:25 AM  Result Value Ref Range Status   MRSA by PCR NEGATIVE NEGATIVE Final    Comment:        The GeneXpert MRSA Assay (FDA approved for NASAL specimens only), is one component of a comprehensive MRSA colonization surveillance program. It is not intended to diagnose MRSA infection nor to guide or monitor treatment for MRSA infections.     BRIEF HOSPITAL COURSE:  Left hip fracture along with nondisplaced fracture of the left superior pubic ramus: secondary to a mechanical fall. Seen by Orthopedics, underwent Left intramedullary trochanteric nail on 11/21-doing well post-operatively. Seen by PT-recommendation as for SNF. Per Orthopedics-WBAT, and to follow up with them in 2 weeks. On chronic Xarelto-which will cover VTE prophylaxis.   History of paroxysmal  atrial fibrillation:This occurred in the setting of cellulitis earlier this year. Currently in sinus rhythm. Echocardiogram showed normal systolic function without wall motion abnormalities. Patient is on anticoagulation with Xarelto-which was held due to need for surgical intervention. Continue her beta blocker. Anticoagulation has been resumed post-operatively.   History of pulmonary embolism:This was the original reason for her being on anticoagulation. Currently stable. Xarelto has been resumed.  History of type 2 diabetes:CBG's stable with SSI while inpatient-oral agents will be resumed on discharge  Recent right lower extremity cellulitis:She was on oral Bactrim for same. She has completed the course of antibiotics. No evidence for active infection in the right leg.  Anemia:Likely post operative blood  loss anemia-transfused 1 unit of PRBC on 11/23-will need closely outpatient monitoring of CBC.   Sarcoidosis: initially on stress dose steroids-will transition to her usual regimen of prednisone on discharge.    Severe malnutrition in the setting of chronic illness:Seen by dietitian. On nutritional supplements.  TODAY-DAY OF DISCHARGE:  Subjective:   Alesa Hertling today has no headache,no chest abdominal pain,no new weakness tingling or numbness, feels much better wants to go home today.   Objective:   Blood pressure 98/74, pulse 87, temperature 97.5 F (36.4 C), temperature source Oral, resp. rate 16, height 5\' 6"  (1.676 m), weight 49.896 kg (110 lb), SpO2 97 %.  Intake/Output Summary (Last 24 hours) at 06/29/15 1216 Last data filed at 06/29/15 0900  Gross per 24 hour  Intake   1200 ml  Output      0 ml  Net   1200 ml   Filed Weights   06/25/15 1718  Weight: 49.896 kg (110 lb)    Exam Awake Alert, Oriented *3, No new F.N deficits, Normal affect .AT,PERRAL Supple Neck,No JVD, No cervical lymphadenopathy appriciated.  Symmetrical Chest wall movement, Good air movement  bilaterally, CTAB RRR,No Gallops,Rubs or new Murmurs, No Parasternal Heave +ve B.Sounds, Abd Soft, Non tender, No organomegaly appriciated, No rebound -guarding or rigidity. No Cyanosis, Clubbing or edema, No new Rash or bruise  DISCHARGE CONDITION: Stable  DISPOSITION: SNF  DISCHARGE INSTRUCTIONS:    Activity:  WBAT tolerated with Full fall precautions use walker/cane & assistance as needed  Get Medicines reviewed and adjusted: Please take all your medications with you for your next visit with your Primary MD  Please request your Primary MD to go over all hospital tests and procedure/radiological results at the follow up, please ask your Primary MD to get all Hospital records sent to his/her office.  If you experience worsening of your admission symptoms, develop shortness of breath, life threatening emergency, suicidal or homicidal thoughts you must seek medical attention immediately by calling 911 or calling your MD immediately  if symptoms less severe.  You must read complete instructions/literature along with all the possible adverse reactions/side effects for all the Medicines you take and that have been prescribed to you. Take any new Medicines after you have completely understood and accpet all the possible adverse reactions/side effects.   Do not drive when taking Pain medications.   Do not take more than prescribed Pain, Sleep and Anxiety Medications  Special Instructions: If you have smoked or chewed Tobacco  in the last 2 yrs please stop smoking, stop any regular Alcohol  and or any Recreational drug use.  Wear Seat belts while driving.  Please note  You were cared for by a hospitalist during your hospital stay. Once you are discharged, your primary care physician will handle any further medical issues. Please note that NO REFILLS for any discharge medications will be authorized once you are discharged, as it is imperative that you return to your primary care physician  (or establish a relationship with a primary care physician if you do not have one) for your aftercare needs so that they can reassess your need for medications and monitor your lab values.  Diet recommendation: Diabetic Diet Heart Healthy diet   Discharge Instructions    Call MD for:  redness, tenderness, or signs of infection (pain, swelling, redness, odor or green/yellow discharge around incision site)    Complete by:  As directed      Diet - low sodium heart healthy  Complete by:  As directed      Increase activity slowly    Complete by:  As directed   WBAT           Follow-up Information    Schedule an appointment as soon as possible for a visit with Marybelle Killings, MD.   Specialty:  Orthopedic Surgery   Why:  needs return office visit 2 weeks postop.   Contact information:   [place] [address] [phone]       Follow up with Chevis Pretty, FNP. Schedule an appointment as soon as possible for a visit in 2 weeks.   Specialty:  Family Medicine   Why:  Hospital follow up   Contact information:   [place] [address] [phone]       Total Time spent on discharge equals 45 minutes.  SignedOren Binet 06/29/2015 12:16 PM

## 2015-06-30 DIAGNOSIS — I480 Paroxysmal atrial fibrillation: Secondary | ICD-10-CM | POA: Diagnosis not present

## 2015-06-30 DIAGNOSIS — E119 Type 2 diabetes mellitus without complications: Secondary | ICD-10-CM | POA: Diagnosis not present

## 2015-06-30 DIAGNOSIS — S7292XA Unspecified fracture of left femur, initial encounter for closed fracture: Secondary | ICD-10-CM | POA: Diagnosis not present

## 2015-06-30 DIAGNOSIS — D869 Sarcoidosis, unspecified: Secondary | ICD-10-CM | POA: Diagnosis not present

## 2015-06-30 DIAGNOSIS — E1165 Type 2 diabetes mellitus with hyperglycemia: Secondary | ICD-10-CM | POA: Diagnosis not present

## 2015-06-30 DIAGNOSIS — E43 Unspecified severe protein-calorie malnutrition: Secondary | ICD-10-CM | POA: Diagnosis not present

## 2015-06-30 DIAGNOSIS — S72142D Displaced intertrochanteric fracture of left femur, subsequent encounter for closed fracture with routine healing: Secondary | ICD-10-CM | POA: Diagnosis not present

## 2015-06-30 DIAGNOSIS — R278 Other lack of coordination: Secondary | ICD-10-CM | POA: Diagnosis not present

## 2015-06-30 DIAGNOSIS — K5732 Diverticulitis of large intestine without perforation or abscess without bleeding: Secondary | ICD-10-CM | POA: Diagnosis not present

## 2015-06-30 DIAGNOSIS — Z86711 Personal history of pulmonary embolism: Secondary | ICD-10-CM | POA: Diagnosis not present

## 2015-06-30 DIAGNOSIS — D649 Anemia, unspecified: Secondary | ICD-10-CM | POA: Diagnosis not present

## 2015-06-30 DIAGNOSIS — I482 Chronic atrial fibrillation: Secondary | ICD-10-CM | POA: Diagnosis not present

## 2015-06-30 DIAGNOSIS — I481 Persistent atrial fibrillation: Secondary | ICD-10-CM | POA: Diagnosis not present

## 2015-06-30 DIAGNOSIS — K449 Diaphragmatic hernia without obstruction or gangrene: Secondary | ICD-10-CM | POA: Diagnosis not present

## 2015-06-30 DIAGNOSIS — E1142 Type 2 diabetes mellitus with diabetic polyneuropathy: Secondary | ICD-10-CM | POA: Diagnosis not present

## 2015-06-30 DIAGNOSIS — R531 Weakness: Secondary | ICD-10-CM | POA: Diagnosis not present

## 2015-06-30 DIAGNOSIS — M6281 Muscle weakness (generalized): Secondary | ICD-10-CM | POA: Diagnosis not present

## 2015-06-30 DIAGNOSIS — G894 Chronic pain syndrome: Secondary | ICD-10-CM | POA: Diagnosis not present

## 2015-06-30 DIAGNOSIS — M810 Age-related osteoporosis without current pathological fracture: Secondary | ICD-10-CM | POA: Diagnosis not present

## 2015-06-30 DIAGNOSIS — Z9181 History of falling: Secondary | ICD-10-CM | POA: Diagnosis not present

## 2015-06-30 DIAGNOSIS — S72142A Displaced intertrochanteric fracture of left femur, initial encounter for closed fracture: Secondary | ICD-10-CM | POA: Diagnosis not present

## 2015-06-30 DIAGNOSIS — S72002A Fracture of unspecified part of neck of left femur, initial encounter for closed fracture: Secondary | ICD-10-CM | POA: Diagnosis not present

## 2015-06-30 DIAGNOSIS — Z872 Personal history of diseases of the skin and subcutaneous tissue: Secondary | ICD-10-CM | POA: Diagnosis not present

## 2015-06-30 DIAGNOSIS — Z79899 Other long term (current) drug therapy: Secondary | ICD-10-CM | POA: Diagnosis not present

## 2015-06-30 DIAGNOSIS — M21252 Flexion deformity, left hip: Secondary | ICD-10-CM | POA: Diagnosis not present

## 2015-06-30 DIAGNOSIS — K5900 Constipation, unspecified: Secondary | ICD-10-CM | POA: Diagnosis not present

## 2015-06-30 DIAGNOSIS — I82409 Acute embolism and thrombosis of unspecified deep veins of unspecified lower extremity: Secondary | ICD-10-CM | POA: Diagnosis not present

## 2015-06-30 LAB — CBC
HEMATOCRIT: 26.8 % — AB (ref 36.0–46.0)
Hemoglobin: 8.7 g/dL — ABNORMAL LOW (ref 12.0–15.0)
MCH: 31.1 pg (ref 26.0–34.0)
MCHC: 32.5 g/dL (ref 30.0–36.0)
MCV: 95.7 fL (ref 78.0–100.0)
PLATELETS: 158 10*3/uL (ref 150–400)
RBC: 2.8 MIL/uL — ABNORMAL LOW (ref 3.87–5.11)
RDW: 13.1 % (ref 11.5–15.5)
WBC: 6.7 10*3/uL (ref 4.0–10.5)

## 2015-06-30 LAB — TYPE AND SCREEN
ABO/RH(D): A POS
Antibody Screen: NEGATIVE
Unit division: 0

## 2015-06-30 LAB — GLUCOSE, CAPILLARY
Glucose-Capillary: 143 mg/dL — ABNORMAL HIGH (ref 65–99)
Glucose-Capillary: 168 mg/dL — ABNORMAL HIGH (ref 65–99)
Glucose-Capillary: 181 mg/dL — ABNORMAL HIGH (ref 65–99)

## 2015-06-30 NOTE — Progress Notes (Signed)
Patient doing well Leg length equal Left foot dorsal flexion plantar flexion intact Plan discharge to skilled nursing facility today

## 2015-06-30 NOTE — Clinical Social Work Placement (Signed)
   CLINICAL SOCIAL WORK PLACEMENT  NOTE  Date:  06/30/2015  Patient Details  Name: Elizabeth Drake MRN: [id number] Date of Birth: 1941/04/05  Clinical Social Work is seeking post-discharge placement for this patient at the [place] level of care (*CSW will initial, date and re-position this form in  chart as items are completed):  Yes   Patient/family provided with [place] Work Department's list of facilities offering this level of care within the geographic area requested by the patient (or if unable, by the patient's family).  Yes   Patient/family informed of their freedom to choose among providers that offer the needed level of care, that participate in Medicare, Medicaid or managed care program needed by the patient, have an available bed and are willing to accept the patient.  Yes   Patient/family informed of [facility]'s ownership interest in [facility] and [facility], as well as of the fact that they are under no obligation to receive care at these facilities.  PASRR submitted to EDS on       PASRR number received on       Existing PASRR number confirmed on 06/28/15     FL2 transmitted to all facilities in geographic area requested by pt/family on 06/28/15     FL2 transmitted to all facilities within larger geographic area on       Patient informed that his/her managed care company has contracts with or will negotiate with certain facilities, including the following:        Yes   Patient/family informed of bed offers received.  Patient chooses bed at [facility]     Physician recommends and patient chooses bed at      Patient to be transferred to [facility] on 06/30/15.  Patient to be transferred to facility by Ambulance [facility])     Patient family notified on 06/30/15 of transfer.  Name of family member notified:  Husband:  Apolinar Junes     PHYSICIAN Please sign FL2     Additional Comment: [place] per MD for d/c of  patient today to [facility] for SNF care.  Notified patient's husband and nursing will inform patient.  Nursing will call report to facility and DC summary was sent to facility yesterday by unit CSW .  No further CSW needs identified. CSW signing off.  Lorie Phenix. Murrell Redden [id number]      _______________________________________________ Williemae Area, LCSW 06/30/2015, 9:31 AM

## 2015-07-04 ENCOUNTER — Other Ambulatory Visit: Payer: Self-pay | Admitting: Family

## 2015-07-04 ENCOUNTER — Non-Acute Institutional Stay (SKILLED_NURSING_FACILITY): Payer: Medicare Other | Admitting: Internal Medicine

## 2015-07-04 DIAGNOSIS — E1142 Type 2 diabetes mellitus with diabetic polyneuropathy: Secondary | ICD-10-CM

## 2015-07-04 DIAGNOSIS — I482 Chronic atrial fibrillation, unspecified: Secondary | ICD-10-CM

## 2015-07-04 DIAGNOSIS — D869 Sarcoidosis, unspecified: Secondary | ICD-10-CM

## 2015-07-04 DIAGNOSIS — S72142A Displaced intertrochanteric fracture of left femur, initial encounter for closed fracture: Secondary | ICD-10-CM | POA: Diagnosis not present

## 2015-07-04 DIAGNOSIS — G894 Chronic pain syndrome: Secondary | ICD-10-CM

## 2015-07-07 ENCOUNTER — Ambulatory Visit: Payer: Medicare Other | Admitting: Nurse Practitioner

## 2015-07-08 NOTE — Progress Notes (Addendum)
Patient ID: Elizabeth Drake, female   DOB: 1941/04/02, 74 y.o.   MRN: [id number]                HISTORY & PHYSICAL  DATE:  07/04/2015          F[facility]: [name]                      LEVEL OF CARE:   SNF   CHIEF COMPLAINT:  Admission to SNF, post stay at [facility], 06/25/2015 through 06/30/2015.       HISTORY OF PRESENT ILLNESS:  This is a patient whom we actually had in the building earlier this year.   At that point, I believe she had an infected elbow and required antibiotics.    She had also at that point become wheelchair-bound.  I think we spent quite a bit of time trying to get her more independent with ambulation, but were never able to do this.    During this hospitalization, she had had a fall at home and suffered a comminuted intertrochanteric left hip fracture.  She underwent surgery.  She is on chronic prednisone.    She comes to [place] in reasonably stable condition, although she apparently has not had adequate analgesia.  She takes Norco at home for chronic pain.  She comes to [place] on oxycodone 5 mg every four hours.  She states that this is completely ineffective for her discomfort.      Past Medical History  Diagnosis Date  . Sarcoidosis ([place])   . Hiatal hernia   . Diabetes mellitus without complication ([place])   . Osteoporosis   . DVT (deep venous thrombosis) ([place])   . Pulmonary emboli [facility])    Past Surgical History  Procedure Laterality Date  . Abdominal hysterectomy    . Joint replacement      toe  . Lung lobectomy Left     Lower  . I&d extremity Left 09/13/2014    Procedure: IRRIGATION AND DEBRIDEMENT EXTREMITY;  Surgeon: Linna Hoff, MD;  Location: [facility];  Service: Orthopedics;  Laterality: Left;  . Intramedullary (im) nail intertrochanteric Left 06/27/2015    Procedure: INTRAMEDULLARY (IM) NAIL INTERTROCHANTRIC AFFIXIS;  Surgeon: Marybelle Killings, MD;  Location: [facility];  Service: Orthopedics;  Laterality: Left;    CURRENT MEDICATIONS:  Medication list is  reviewed.    Oxycodone 5 mg q.4 p.r.n.      MiraLAX 17 g p.o. p.r.n.      Senokot 2 tablets/17.2 mg at bedtime.    Os-Cal 500/200, 1 tablet daily.      Amaryl 2 mg daily.     Metformin 500 daily.    Lopressor 25 b.i.d.      Prednisone 5 q.d.      Xarelto 20 mg daily (chronic AFib).    Nasonex 2 sprays in each nostril daily.     SOCIAL HISTORY:                   HOUSING:  The patient lives in [place] with family members, including a husband and a son.   FUNCTIONAL STATUS:  Her exact functional status is unclear at this point.      reports that she quit smoking about 14 years ago. Her smoking use included Cigarettes. She does not have any smokeless tobacco history on file. She reports that she does not drink alcohol or use illicit drugs.  family history includes Cancer in her father; Cancer -  Other in her sister; Heart disease in her mother; Hypertension in her mother.  REVIEW OF SYSTEMS:       HEENT:  No headache.     CHEST/RESPIRATORY:  No shortness of breath.  No cough.   CARDIAC:  No chest pain.  No palpitations.    GI:  No nausea, vomiting, or diarrhea.      GU:  No dysuria.    MUSCULOSKELETAL:  Complaining of severe pain in the left hip incision.   SKIN:  Nursing reports a pressure area on the left buttock.   NEUROLOGICAL:  The patient is wheelchair-bound chronically.  Her exact functional status is unclear.    PHYSICAL EXAMINATION:   GENERAL APPEARANCE:  The patient is not in any distress.         CHEST/RESPIRATORY:  Shallow, but otherwise clear air entry bilaterally.   There is no accessory muscle use.   CARDIOVASCULAR:   CARDIAC:  Heart sounds are actually fairly regular.  There are no murmurs.  She appears to be euvolemic.     GASTROINTESTINAL:   LIVER/SPLEEN/KIDNEY:   No liver, no spleen.  No tenderness.    GENITOURINARY:   BLADDER:  Not distended.  There is no CVA tenderness.     MUSCULOSKELETAL:    EXTREMITIES:  Her incision sites look fine.  There is  obviously a fair amount of blood in the soft tissues, including her thigh.  Distally, she has some venous stasis.  Peripheral pulses are palpable.  She has no wounds on her heels.    SKIN:     INSPECTION:  Over the left buttock just proximal to the lower coccyx area is a stage II wound.  I think simply foam-based dressings and pressure relief shoulder suffice here.   NEUROLOGICAL:   SENSATION/STRENGTH:  She appears to be able to move all her limbs except the left leg at this point.  As I remember, she had diffuse weakness the last time she was here.  I do not think that has changed much.    ASSESSMENT/PLAN:                  Left hip fracture.  Status post surgery.  Obviously considerable soft tissue bleeding at one point, although this is old.   I will check a CBC on her.    Uncontrolled pain.   This patient is not opiate-nave and I think that is probably what we are dealing with here, that and the p.r.n. medication.  I am going to add OxyContin starting tomorrow morning.    Notable that her CT scan of the hip showed sigmoid diverticulosis, although she does not appear to be unstable from this regard.    Chronic atrial fibrillation.  On Xarelto.  That was held for surgery and now resumed.  Her heart rate is controlled.    Type 2 diabetes.  She is on oral agents.  I do not think we need a sliding scale here.  I will discontinue this and check her blood sugars b.i.d.  Review this in 48 hours when I am here.  She is on chronic prednisone, currently at 5 mg.    History of pulmonary embolism.  This is another reason that she is anticoagulated.    Sarcoidosis.  She has not had an outbreak of this.    Listed as having protein calorie malnutrition which was severe.  I will need to review her records on this.    Stage II pressure ulcer, left gluteal.  As noted above.

## 2015-07-12 DIAGNOSIS — S72142D Displaced intertrochanteric fracture of left femur, subsequent encounter for closed fracture with routine healing: Secondary | ICD-10-CM | POA: Diagnosis not present

## 2015-07-12 IMAGING — CR DG CHEST 2V
2 series · 2 of 2 positions shown · non-contrast
Comparison: 06/24/2013

CLINICAL DATA: Lower extremity weakness since earlier this evening.

EXAM:
CHEST  2 VIEW

[chest lat]
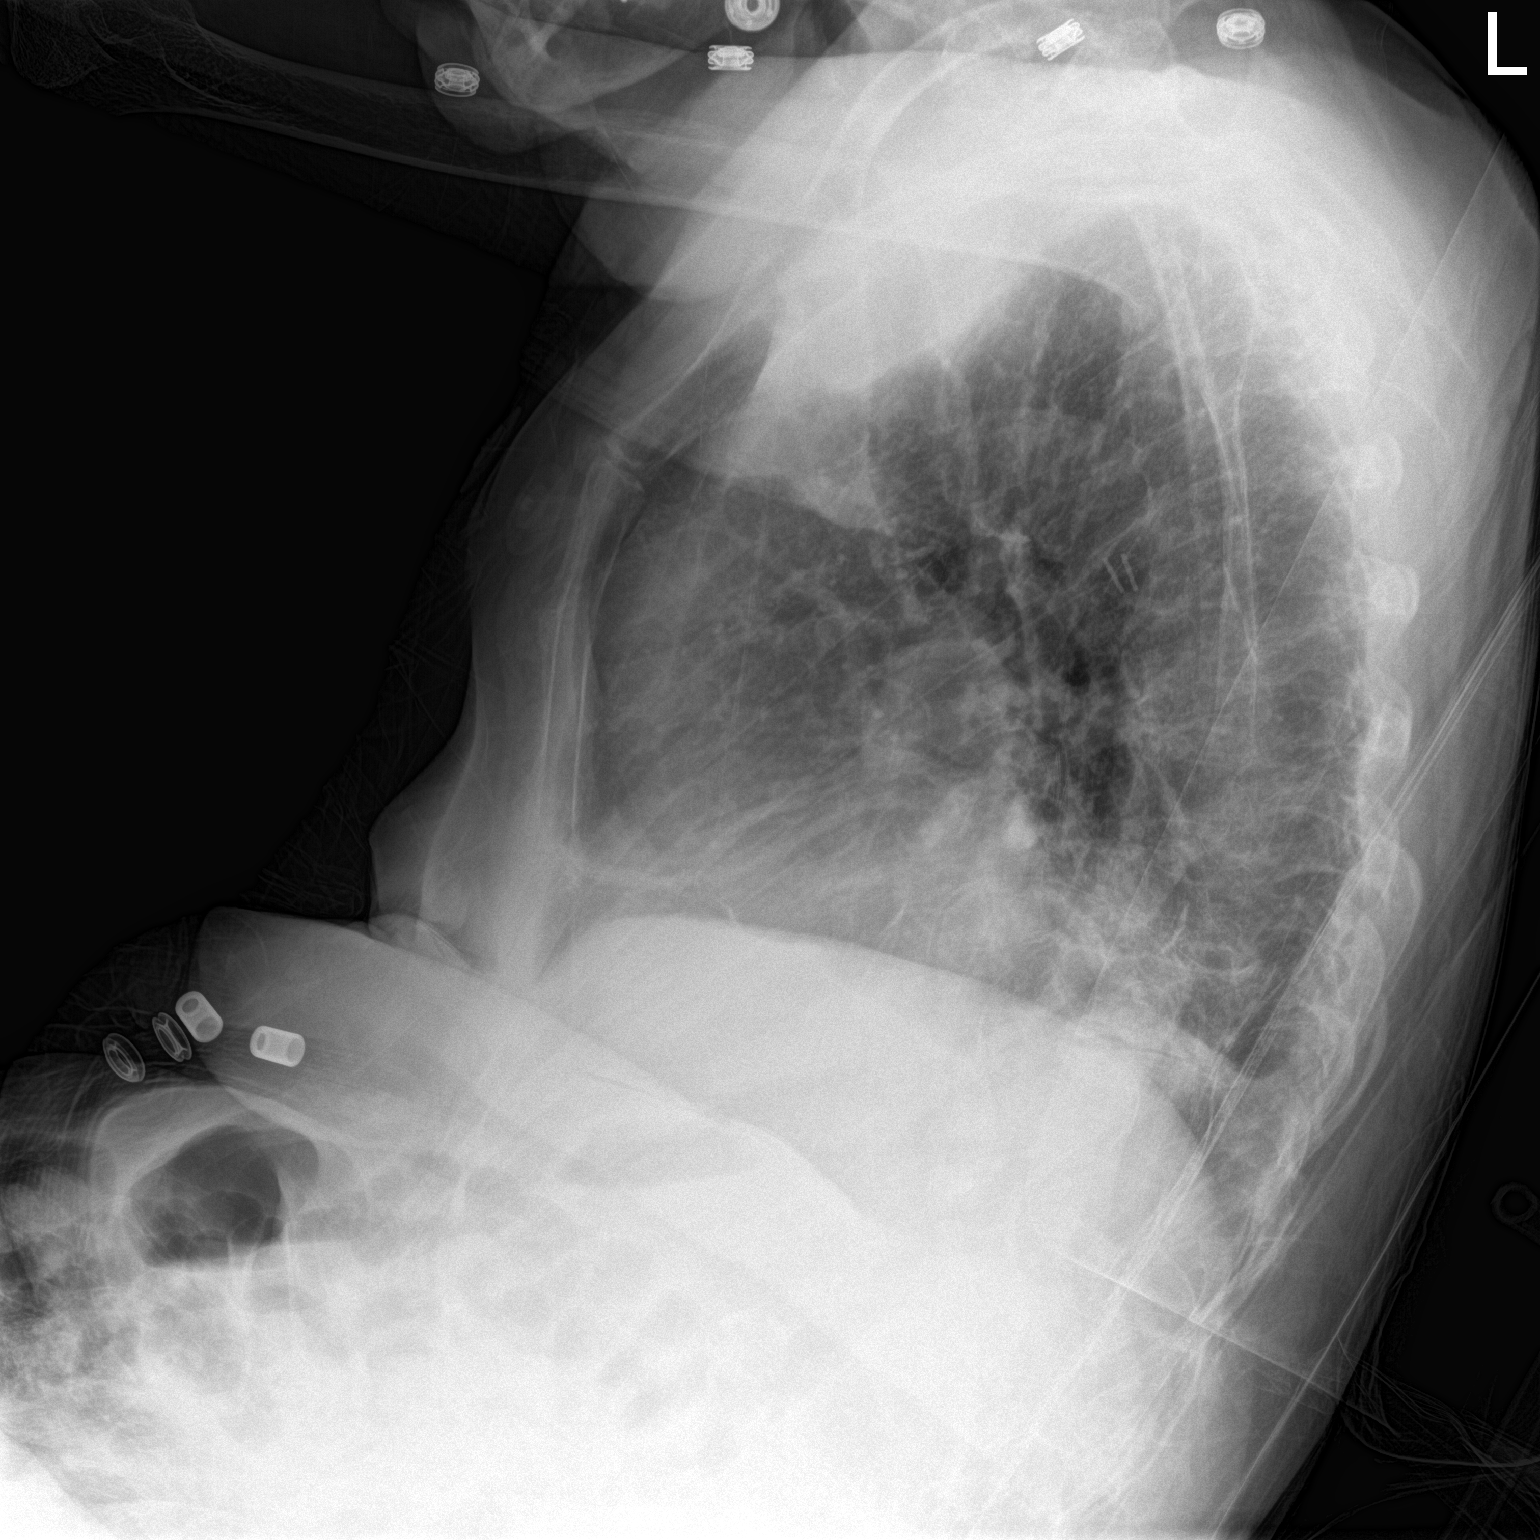

[chest ap]
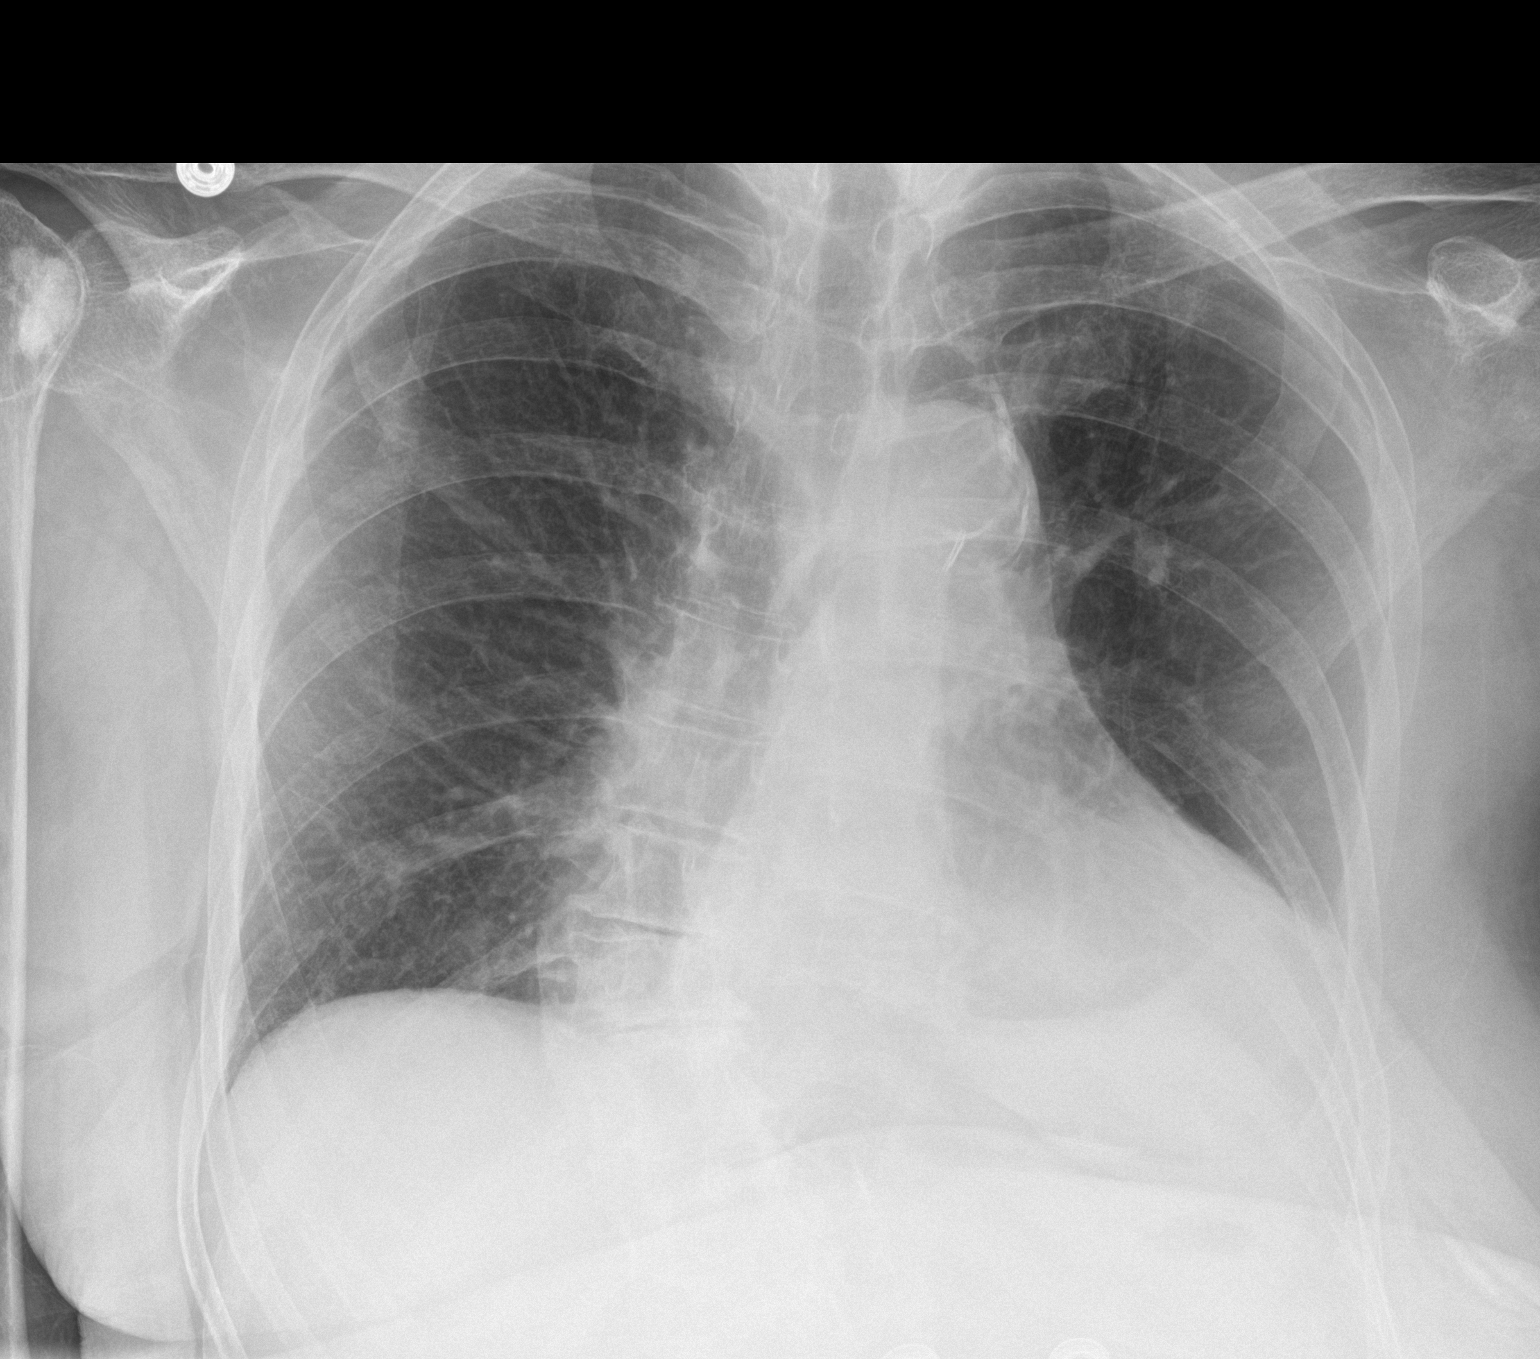

[2 of 2 positions shown; findings below may reference images not displayed]

FINDINGS: Shallow inspiration. Cardiac enlargement without vascular
congestion. Calcified and tortuous aorta. Mild blunting of the left
costophrenic angle may be due to fluid or thickened pleura. No
change since prior study. No focal airspace consolidation in the
lungs. Calcified and tortuous aorta. Thoracic scoliosis convex
towards the right. Sclerosis in the proximal right humerus is
unchanged.
IMPRESSION: Unchanged appearance of the chest since previous study. Cardiac
enlargement without vascular congestion. No focal consolidation.
Fluid or thickened pleura in the left lung base. Thoracic scoliosis
and degenerative changes. Benign-appearing sclerosis in the right
humerus.

## 2015-07-18 ENCOUNTER — Other Ambulatory Visit: Payer: Self-pay | Admitting: Nurse Practitioner

## 2015-07-18 ENCOUNTER — Other Ambulatory Visit: Payer: Self-pay | Admitting: *Deleted

## 2015-07-18 ENCOUNTER — Non-Acute Institutional Stay (SKILLED_NURSING_FACILITY): Payer: Medicare Other | Admitting: Internal Medicine

## 2015-07-18 DIAGNOSIS — E1142 Type 2 diabetes mellitus with diabetic polyneuropathy: Secondary | ICD-10-CM

## 2015-07-18 DIAGNOSIS — D869 Sarcoidosis, unspecified: Secondary | ICD-10-CM

## 2015-07-18 DIAGNOSIS — I481 Persistent atrial fibrillation: Secondary | ICD-10-CM | POA: Diagnosis not present

## 2015-07-18 DIAGNOSIS — S72142A Displaced intertrochanteric fracture of left femur, initial encounter for closed fracture: Secondary | ICD-10-CM

## 2015-07-18 DIAGNOSIS — I4819 Other persistent atrial fibrillation: Secondary | ICD-10-CM

## 2015-07-18 MED ORDER — OXYCODONE HCL 5 MG PO TABS: ORAL_TABLET | ORAL | Status: DC

## 2015-07-18 NOTE — Telephone Encounter (Signed)
[facility] 

## 2015-07-18 NOTE — Telephone Encounter (Signed)
Looks like 180 pain pills were already ordered by someone else

## 2015-07-18 NOTE — Telephone Encounter (Signed)
[place]

## 2015-07-18 NOTE — Telephone Encounter (Signed)
[facility] 

## 2015-07-23 NOTE — Progress Notes (Addendum)
Patient ID: Elizabeth Drake, female   DOB: 1941-04-09, 74 y.o.   MRN: [id number]                PROGRESS NOTE  DATE:  07/18/2015          FACILITY: [name]                        LEVEL OF CARE:   SNF   Acute Visit             CHIEF COMPLAINT:  Follow up admission from two weeks ago.     HISTORY OF PRESENT ILLNESS:  Elizabeth Drake is a patient who had a fall at home and suffered a comminuted intertrochanteric left hip fracture.  She underwent surgery.  She was stable postoperatively.    She is on chronic prednisone for a history of sarcoidosis.    The patient was never able to transfer after her complicated admission here earlier this year.  I am not exactly sure how she was transferred at home.  Currently, she is using a [employer] fast streamer for the most part.    The patient is a diabetic, on metformin 500 daily and Amaryl 2 mg per day.  Her CBGs have been really quite stable, mostly in the low 100s fasting, mid to upper 100s a.c. lunch, and mid to upper 100s a.c. dinner.    CURRENT MEDICATIONS:  Medication list is reviewed.             Multivitamin daily.     Zinc sulfate 1 capsule daily.    Pro-Stat 30 mL daily.    Senokot S 2 tablets at bedtime.    Os-Cal plus D 500/200, 1 tablet daily.     Prednisone 5 mg daily.    Xarelto 20 mg (chronically for AFib).     Nasonex 50 mcg, 2 sprays in each nostril daily.    Vitamin C 500 b.i.d.      Lopressor 25 b.i.d.      OxyContin 15 b.i.d.      Tylenol 500, 2 tablets three times a day.    LABORATORY DATA:   Lab work from 07/04/2015:     CBC:  White count 7.1, hemoglobin 10.5, platelet count 275.  Differential count normal.    Basic metabolic panel:  Sodium 0000000, potassium 4, BUN 15, creatinine 0.59.    Hemoglobin A1c 6.2.    REVIEW OF SYSTEMS:    HEENT:   No headache.   CHEST/RESPIRATORY:  No shortness of breath.   CARDIAC:  No chest pain.   GI:  No nausea, vomiting, or diarrhea.            GU:  No dysuria.      MUSCULOSKELETAL:  States her pain is mostly under control, but she is reluctant to allow me to start to reduce her narcotics.    PHYSICAL EXAMINATION:   VITAL SIGNS:     TEMPERATURE:  98.7.   PULSE:  77.    RESPIRATIONS:  18.   BLOOD PRESSURE:  118/72.   CHEST/RESPIRATORY:  Exam is clear.       CARDIOVASCULAR:   CARDIAC:  Heart sounds are normal.  There are no murmurs.    GASTROINTESTINAL:   ABDOMEN:  Soft, nontender.  No masses.    LIVER/SPLEEN/KIDNEYS:  No liver, no spleen.   GENITOURINARY:   BLADDER:  Not distended.    CIRCULATION:   EDEMA/VARICOSITIES:  Extremities:  There is no  evidence of a DVT.   MUSCULOSKELETAL:   EXTREMITIES:   BILATERAL LOWER EXTREMITIES:  Marked lower extremity weakness.    ASSESSMENT/PLAN:                   Left hip fracture.  This is healing well.     History of sarcoidosis.  On chronic prednisone.   She is not on a bisphosphonate, nor do I know anything about her bone status.    Type 2 diabetes.  This seems stable.  I will reduce the frequency of her CBGs.    Chronic atrial fibrillation.  Heart rate is controlled.  She is on Xarelto 20 mg daily.

## 2015-08-02 ENCOUNTER — Other Ambulatory Visit: Payer: Self-pay | Admitting: *Deleted

## 2015-08-02 MED ORDER — OXYCODONE HCL ER 15 MG PO T12A: EXTENDED_RELEASE_TABLET | ORAL | Status: DC

## 2015-08-02 NOTE — Telephone Encounter (Signed)
[facility] 

## 2015-08-12 IMAGING — CR DG CHEST 1V PORT
1 series · 1 of 1 positions shown · non-contrast
Comparison: 08/13/2014

CLINICAL DATA: Left lower lobectomy.  Sarcoid.

EXAM:
PORTABLE CHEST - 1 VIEW

[portable]
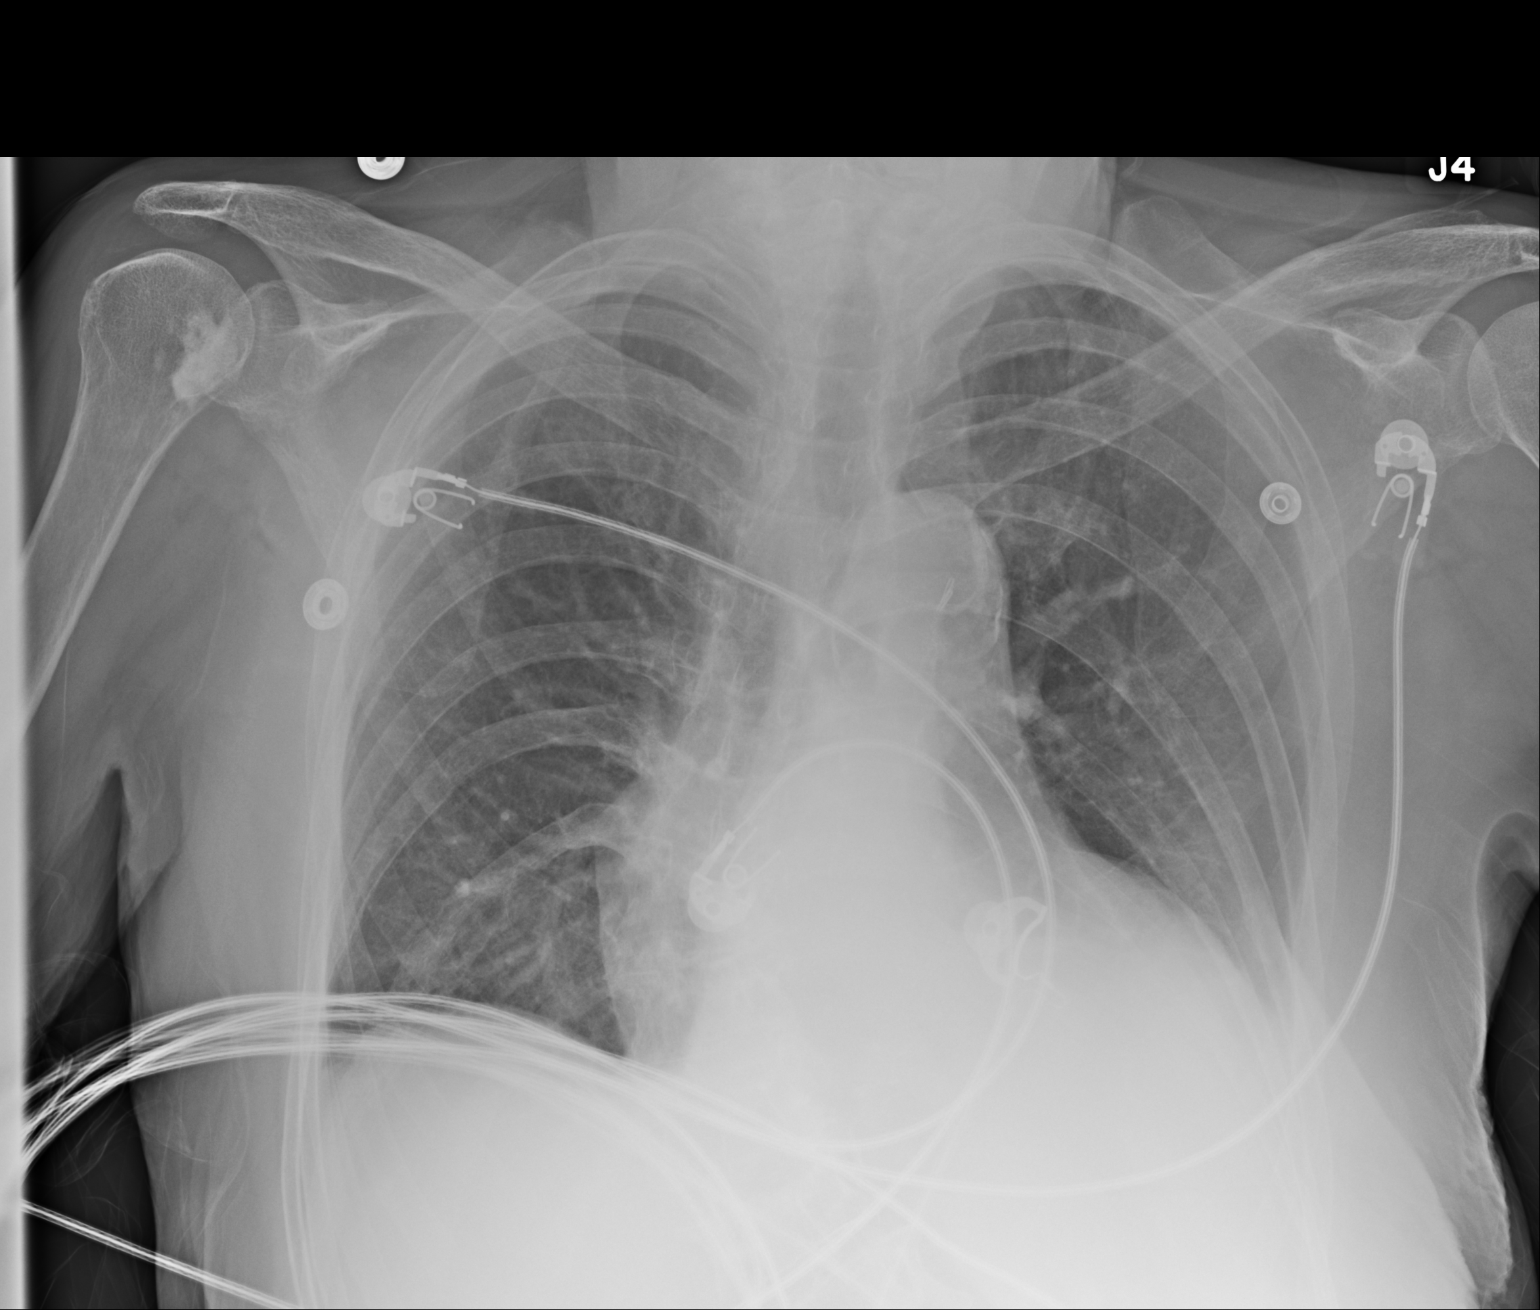

[1 of 1 positions shown; findings below may reference images not displayed]

FINDINGS: Mild cardiomegaly. Left lower lung atelectasis or infiltrate
slightly increased since prior study. Cannot exclude small left
effusion. No confluent opacity on the right. No acute bony
abnormality. Sclerotic area within the right humeral head is stable.
IMPRESSION: No active disease.

## 2015-08-19 DIAGNOSIS — R278 Other lack of coordination: Secondary | ICD-10-CM | POA: Diagnosis not present

## 2015-08-19 DIAGNOSIS — E119 Type 2 diabetes mellitus without complications: Secondary | ICD-10-CM | POA: Diagnosis not present

## 2015-08-19 DIAGNOSIS — K5732 Diverticulitis of large intestine without perforation or abscess without bleeding: Secondary | ICD-10-CM | POA: Diagnosis not present

## 2015-08-19 DIAGNOSIS — S72142D Displaced intertrochanteric fracture of left femur, subsequent encounter for closed fracture with routine healing: Secondary | ICD-10-CM | POA: Diagnosis not present

## 2015-08-19 DIAGNOSIS — R531 Weakness: Secondary | ICD-10-CM | POA: Diagnosis not present

## 2015-09-07 ENCOUNTER — Telehealth: Payer: Self-pay | Admitting: Nurse Practitioner

## 2015-09-17 DIAGNOSIS — M25552 Pain in left hip: Secondary | ICD-10-CM | POA: Diagnosis not present

## 2015-09-17 DIAGNOSIS — M62562 Muscle wasting and atrophy, not elsewhere classified, left lower leg: Secondary | ICD-10-CM | POA: Diagnosis not present

## 2015-09-17 DIAGNOSIS — S32512D Fracture of superior rim of left pubis, subsequent encounter for fracture with routine healing: Secondary | ICD-10-CM | POA: Diagnosis not present

## 2015-09-17 DIAGNOSIS — E43 Unspecified severe protein-calorie malnutrition: Secondary | ICD-10-CM | POA: Diagnosis not present

## 2015-09-17 DIAGNOSIS — E119 Type 2 diabetes mellitus without complications: Secondary | ICD-10-CM | POA: Diagnosis not present

## 2015-09-17 DIAGNOSIS — M6281 Muscle weakness (generalized): Secondary | ICD-10-CM | POA: Diagnosis not present

## 2015-09-17 DIAGNOSIS — G894 Chronic pain syndrome: Secondary | ICD-10-CM | POA: Diagnosis not present

## 2015-09-17 DIAGNOSIS — I480 Paroxysmal atrial fibrillation: Secondary | ICD-10-CM | POA: Diagnosis not present

## 2015-09-17 DIAGNOSIS — M62561 Muscle wasting and atrophy, not elsewhere classified, right lower leg: Secondary | ICD-10-CM | POA: Diagnosis not present

## 2015-09-17 DIAGNOSIS — Z9181 History of falling: Secondary | ICD-10-CM | POA: Diagnosis not present

## 2015-09-17 DIAGNOSIS — S72042D Displaced fracture of base of neck of left femur, subsequent encounter for closed fracture with routine healing: Secondary | ICD-10-CM | POA: Diagnosis not present

## 2015-09-19 DIAGNOSIS — M62561 Muscle wasting and atrophy, not elsewhere classified, right lower leg: Secondary | ICD-10-CM | POA: Diagnosis not present

## 2015-09-19 DIAGNOSIS — M6281 Muscle weakness (generalized): Secondary | ICD-10-CM | POA: Diagnosis not present

## 2015-09-19 DIAGNOSIS — M25552 Pain in left hip: Secondary | ICD-10-CM | POA: Diagnosis not present

## 2015-09-19 DIAGNOSIS — S32512D Fracture of superior rim of left pubis, subsequent encounter for fracture with routine healing: Secondary | ICD-10-CM | POA: Diagnosis not present

## 2015-09-19 DIAGNOSIS — M62562 Muscle wasting and atrophy, not elsewhere classified, left lower leg: Secondary | ICD-10-CM | POA: Diagnosis not present

## 2015-09-19 DIAGNOSIS — S72042D Displaced fracture of base of neck of left femur, subsequent encounter for closed fracture with routine healing: Secondary | ICD-10-CM | POA: Diagnosis not present

## 2015-09-20 ENCOUNTER — Ambulatory Visit (INDEPENDENT_AMBULATORY_CARE_PROVIDER_SITE_OTHER): Payer: Medicare Other | Admitting: Nurse Practitioner

## 2015-09-20 ENCOUNTER — Encounter: Payer: Self-pay | Admitting: Nurse Practitioner

## 2015-09-20 ENCOUNTER — Telehealth: Payer: Self-pay | Admitting: Nurse Practitioner

## 2015-09-20 VITALS — BP 127/75 | HR 67 | Temp 97.1°F

## 2015-09-20 DIAGNOSIS — E118 Type 2 diabetes mellitus with unspecified complications: Secondary | ICD-10-CM

## 2015-09-20 DIAGNOSIS — I4891 Unspecified atrial fibrillation: Secondary | ICD-10-CM | POA: Diagnosis not present

## 2015-09-20 DIAGNOSIS — M1990 Unspecified osteoarthritis, unspecified site: Secondary | ICD-10-CM | POA: Diagnosis not present

## 2015-09-20 DIAGNOSIS — I10 Essential (primary) hypertension: Secondary | ICD-10-CM

## 2015-09-20 DIAGNOSIS — R5381 Other malaise: Secondary | ICD-10-CM | POA: Diagnosis not present

## 2015-09-20 DIAGNOSIS — E785 Hyperlipidemia, unspecified: Secondary | ICD-10-CM

## 2015-09-20 DIAGNOSIS — N3281 Overactive bladder: Secondary | ICD-10-CM | POA: Diagnosis not present

## 2015-09-20 LAB — POCT GLYCOSYLATED HEMOGLOBIN (HGB A1C): HEMOGLOBIN A1C: 6.2

## 2015-09-20 MED ORDER — PREDNISONE 5 MG PO TABS: 5.0000 mg | ORAL_TABLET | Freq: Every day | ORAL | Status: DC

## 2015-09-20 MED ORDER — OXYCODONE HCL ER 15 MG PO T12A: EXTENDED_RELEASE_TABLET | ORAL | Status: DC

## 2015-09-20 MED ORDER — METOPROLOL TARTRATE 25 MG PO TABS: 25.0000 mg | ORAL_TABLET | Freq: Two times a day (BID) | ORAL | Status: DC

## 2015-09-20 MED ORDER — RIVAROXABAN 20 MG PO TABS: ORAL_TABLET | ORAL | Status: DC

## 2015-09-20 MED ORDER — GLIMEPIRIDE 2 MG PO TABS: 2.0000 mg | ORAL_TABLET | Freq: Every day | ORAL | Status: DC

## 2015-09-20 NOTE — Patient Instructions (Signed)
Fall Prevention in the Home  Falls can cause injuries and can affect people from all age groups. There are many simple things that you can do to make your home safe and to help prevent falls. WHAT CAN I DO ON THE OUTSIDE OF MY HOME?  Regularly repair the edges of walkways and driveways and fix any cracks.  Remove high doorway thresholds.  Trim any shrubbery on the main path into your home.  Use bright outdoor lighting.  Clear walkways of debris and clutter, including tools and rocks.  Regularly check that handrails are securely fastened and in good repair. Both sides of any steps should have handrails.  Install guardrails along the edges of any raised decks or porches.  Have leaves, snow, and ice cleared regularly.  Use sand or salt on walkways during winter months.  In the garage, clean up any spills right away, including grease or oil spills. WHAT CAN I DO IN THE BATHROOM?  Use night lights.  Install grab bars by the toilet and in the tub and shower. Do not use towel bars as grab bars.  Use non-skid mats or decals on the floor of the tub or shower.  If you need to sit down while you are in the shower, use a plastic, non-slip stool..  Keep the floor dry. Immediately clean up any water that spills on the floor.  Remove soap buildup in the tub or shower on a regular basis.  Attach bath mats securely with double-sided non-slip rug tape.  Remove throw rugs and other tripping hazards from the floor. WHAT CAN I DO IN THE BEDROOM?  Use night lights.  Make sure that a bedside light is easy to reach.  Do not use oversized bedding that drapes onto the floor.  Have a firm chair that has side arms to use for getting dressed.  Remove throw rugs and other tripping hazards from the floor. WHAT CAN I DO IN THE KITCHEN?   Clean up any spills right away.  Avoid walking on wet floors.  Place frequently used items in easy-to-reach places.  If you need to reach for something  above you, use a sturdy step stool that has a grab bar.  Keep electrical cables out of the way.  Do not use floor polish or wax that makes floors slippery. If you have to use wax, make sure that it is non-skid floor wax.  Remove throw rugs and other tripping hazards from the floor. WHAT CAN I DO IN THE STAIRWAYS?  Do not leave any items on the stairs.  Make sure that there are handrails on both sides of the stairs. Fix handrails that are broken or loose. Make sure that handrails are as long as the stairways.  Check any carpeting to make sure that it is firmly attached to the stairs. Fix any carpet that is loose or worn.  Avoid having throw rugs at the top or bottom of stairways, or secure the rugs with carpet tape to prevent them from moving.  Make sure that you have a light switch at the top of the stairs and the bottom of the stairs. If you do not have them, have them installed. WHAT ARE SOME OTHER FALL PREVENTION TIPS?  Wear closed-toe shoes that fit well and support your feet. Wear shoes that have rubber soles or low heels.  When you use a stepladder, make sure that it is completely opened and that the sides are firmly locked. Have someone hold the ladder while you   are using it. Do not climb a closed stepladder.  Add color or contrast paint or tape to grab bars and handrails in your home. Place contrasting color strips on the first and last steps.  Use mobility aids as needed, such as canes, walkers, scooters, and crutches.  Turn on lights if it is dark. Replace any light bulbs that burn out.  Set up furniture so that there are clear paths. Keep the furniture in the same spot.  Fix any uneven floor surfaces.  Choose a carpet design that does not hide the edge of steps of a stairway.  Be aware of any and all pets.  Review your medicines with your healthcare provider. Some medicines can cause dizziness or changes in blood pressure, which increase your risk of falling. Talk  with your health care provider about other ways that you can decrease your risk of falls. This may include working with a physical therapist or trainer to improve your strength, balance, and endurance.   This information is not intended to replace advice given to you by your health care provider. Make sure you discuss any questions you have with your health care provider.   Document Released: 07/13/2002 Document Revised: 12/07/2014 Document Reviewed: 08/27/2014 Elsevier Interactive Patient Education 2016 Elsevier Inc.  

## 2015-09-20 NOTE — Telephone Encounter (Signed)
Spoke to MMM and she states she signed the rx and handed it to the pt. Advised pt they should have it and she will look through the paperwork for it.

## 2015-09-20 NOTE — Progress Notes (Signed)
Subjective:    Patient ID: Elizabeth Drake, female    DOB: May 08, 1941, 75 y.o.   MRN: [id number]  HPI   Patient here today for follow up of chronic medical problems.  Outpatient Encounter Prescriptions as of 09/20/2015  Medication Sig  . calcium-vitamin D (OSCAL 500/200 D-3) 500-200 MG-UNIT per tablet Take 1 tablet by mouth daily with breakfast.  . feeding supplement (BOOST / RESOURCE BREEZE) LIQD Take 1 Container by mouth 2 (two) times daily between meals.  Marland Kitchen glimepiride (AMARYL) 2 MG tablet Take 1 tablet (2 mg total) by mouth daily.  . metFORMIN (GLUCOPHAGE) 500 MG tablet Take 1 tablet (500 mg total) by mouth daily.  . metoprolol tartrate (LOPRESSOR) 25 MG tablet Take 1 tablet (25 mg total) by mouth 2 (two) times daily.  . mometasone (NASONEX) 50 MCG/ACT nasal spray 2 sprays per nostril qd (Patient taking differently: Place 2 sprays into the nose daily as needed (allergies). 2 sprays per nostril qd)  . oxyCODONE (OXYCONTIN) 15 mg 12 hr tablet Take one tablet by mouth every 12 hours for pain  . predniSONE (DELTASONE) 5 MG tablet TAKE ONE TABLET BY MOUTH DAILY WITH BREAKFAST  . rivaroxaban (XARELTO) 20 MG TABS tablet TAKE ONE TABLET BY MOUTH ONE TIME DAILY WITH SUPPER  . senna (SENOKOT) 8.6 MG TABS tablet Take 2 tablets (17.2 mg total) by mouth at bedtime.  . [DISCONTINUED] insulin aspart (NOVOLOG) 100 UNIT/ML injection insulin aspart (novoLOG) injection 0-9 Units 0-9 Units, Subcutaneous, Every 4 hours (6 times per day) CBG < 70: implement hypoglycemia protocol CBG 70 - 120: 0 units CBG 121 - 150: 1 unit CBG 151 - 200: 2 units CBG 201 - 250: 3 units CBG 251 - 300: 5 units CBG 301 - 350: 7 units CBG 351 - 400: 9 units CBG > 400: call MD  . [DISCONTINUED] oxyCODONE (OXY IR/ROXICODONE) 5 MG immediate release tablet Take one tablet by mouth every 4 hours as needed for moderate pain  . acetaminophen (TYLENOL) 500 MG tablet Take 2 tablets (1,000 mg total) by mouth every 8 (eight) hours.  (Patient not taking: Reported on 09/20/2015)  . polyethylene glycol (MIRALAX / GLYCOLAX) packet Take 17 g by mouth daily as needed. (Patient not taking: Reported on 09/20/2015)   No facility-administered encounter medications on file as of 09/20/2015.    * Patient fell and fractured her hip rigt before thanksgiving ast year and fracture left hip-Had to have surgery then went to rehab at [facility]. Was discharged this past Monday. Unable to do all ADL's by herself. Needs referral to [facility] medical rheumatologist.  Hypertension This is a chronic problem. The current episode started more than 1 year ago. The problem is unchanged. The problem is controlled. Pertinent negatives include no chest pain, headaches, neck pain, palpitations, peripheral edema or shortness of breath. There are no associated agents to hypertension. Risk factors for coronary artery disease include dyslipidemia, family history, post-menopausal state and sedentary lifestyle. Past treatments include beta blockers. The current treatment provides moderate improvement. Hypertensive end-organ damage includes CAD/MI.  Hyperlipidemia This is a chronic problem. The current episode started more than 1 year ago. The problem is controlled. Recent lipid tests were reviewed and are normal. Exacerbating diseases include diabetes. She has no history of hypothyroidism or obesity. Pertinent negatives include no chest pain or shortness of breath. Current antihyperlipidemic treatment includes diet change. The current treatment provides moderate improvement of lipids. Compliance problems include adherence to exercise.  Risk factors for coronary artery disease  include post-menopausal, hypertension, diabetes mellitus, dyslipidemia and family history.  Diabetes She presents for her follow-up diabetic visit. She has type 2 diabetes mellitus. No MedicAlert identification noted. Her disease course has been stable. Pertinent negatives for hypoglycemia include no  headaches. Pertinent negatives for diabetes include no chest pain, no polydipsia, no polyphagia, no polyuria, no weakness and no weight loss. Symptoms are stable. Risk factors for coronary artery disease include diabetes mellitus, dyslipidemia, family history, hypertension, post-menopausal and sedentary lifestyle. Current diabetic treatment includes oral agent (dual therapy). She is compliant with treatment most of the time. Her weight is stable. She is following a low salt and diabetic diet. When asked about meal planning, she reported none. She has not had a previous visit with a dietician. She rarely participates in exercise. Her breakfast blood glucose is taken between 7-8 am. Her breakfast blood glucose range is generally 90-110 mg/dl. Her overall blood glucose range is 90-110 mg/dl. An ACE inhibitor/angiotensin II receptor blocker is not being taken. She sees a podiatrist.Eye exam is current.  Overactive bladder Has occasions when has trouble voiding-on no meds currently Atrial fib/ hx pulmonary emboli Currently on metoprolol and xeralto- no bleeding Sarcoidosis On prednisone chronic- no recent flare ups Osteoarthritis/wheel chair stricken Has a lot of stiffness but prednisone helps that and pain meds help with pain Physical deconditioning Has actually lost weight since hip fracture    Review of Systems  Constitutional: Negative.   HENT: Negative.   Respiratory: Negative.   Cardiovascular: Negative.   Gastrointestinal: Negative.   Genitourinary: Negative.   Neurological: Negative.   Psychiatric/Behavioral: Negative.   All other systems reviewed and are negative.      Objective:   Physical Exam  Constitutional: She is oriented to person, place, and time. She appears well-developed and well-nourished.  HENT:  Right Ear: External ear normal.  Left Ear: External ear normal.  Nose: Nose normal.  Mouth/Throat: Oropharynx is clear and moist.  Cardiovascular: Normal rate, regular  rhythm and normal heart sounds.   Pulmonary/Chest: Effort normal and breath sounds normal.  Abdominal: Soft. Bowel sounds are normal.  Musculoskeletal:  Wheel chair confined- and lower extremities are very weak against resistance.  Neurological: She is alert and oriented to person, place, and time.  Skin: Skin is warm.  multiple scrapes and cuts bil lower ext  Psychiatric: She has a normal mood and affect. Her behavior is normal. Judgment and thought content normal.   BP 127/75 mmHg  Pulse 67  Temp(Src) 97.1 F (36.2 C) (Oral)  Ht   Wt       Assessment & Plan:  1. Type 2 diabetes mellitus with complication, without long-term current use of insulin (HCC) Continue to watch carbs in diet - POCT glycosylated hemoglobin (Hb A1C) - glimepiride (AMARYL) 2 MG tablet; Take 1 tablet (2 mg total) by mouth daily.  Dispense: 30 tablet; Refill: 5  2. Essential hypertension Do not add salt to diet - CMP14+EGFR - metoprolol tartrate (LOPRESSOR) 25 MG tablet; Take 1 tablet (25 mg total) by mouth 2 (two) times daily.  Dispense: 60 tablet; Refill: 5  3. Hyperlipidemia Low fat diet - Lipid panel  4. Atrial fibrillation with RVR (HCC) - rivaroxaban (XARELTO) 20 MG TABS tablet; TAKE ONE TABLET BY MOUTH ONE TIME DAILY WITH SUPPER  Dispense: 30 tablet; Refill: 5  5. Osteoarthritis, unspecified osteoarthritis type, unspecified site - predniSONE (DELTASONE) 5 MG tablet; Take 1 tablet (5 mg total) by mouth daily with breakfast.  Dispense: 30 tablet; Refill:  5 - oxyCODONE (OXYCONTIN) 15 mg 12 hr tablet; Take one tablet by mouth every 12 hours for pain  Dispense: 60 tablet; Refill: 0  6. OAB (overactive bladder)  7. Physical deconditioning Continue daily boost   Referral made to rheumatologist Labs pending Health maintenance reviewed Diet and exercise encouraged Continue all meds Follow up  In 3 months   [place], FNP

## 2015-09-21 DIAGNOSIS — M25552 Pain in left hip: Secondary | ICD-10-CM | POA: Diagnosis not present

## 2015-09-21 DIAGNOSIS — M62562 Muscle wasting and atrophy, not elsewhere classified, left lower leg: Secondary | ICD-10-CM | POA: Diagnosis not present

## 2015-09-21 DIAGNOSIS — M6281 Muscle weakness (generalized): Secondary | ICD-10-CM | POA: Diagnosis not present

## 2015-09-21 DIAGNOSIS — S32512D Fracture of superior rim of left pubis, subsequent encounter for fracture with routine healing: Secondary | ICD-10-CM | POA: Diagnosis not present

## 2015-09-21 DIAGNOSIS — M62561 Muscle wasting and atrophy, not elsewhere classified, right lower leg: Secondary | ICD-10-CM | POA: Diagnosis not present

## 2015-09-21 DIAGNOSIS — S72042D Displaced fracture of base of neck of left femur, subsequent encounter for closed fracture with routine healing: Secondary | ICD-10-CM | POA: Diagnosis not present

## 2015-09-21 LAB — CMP14+EGFR
ALT: 9 IU/L (ref 0–32)
AST: 12 IU/L (ref 0–40)
Albumin/Globulin Ratio: 1 — ABNORMAL LOW (ref 1.1–2.5)
Albumin: 3.7 g/dL (ref 3.5–4.8)
Alkaline Phosphatase: 78 IU/L (ref 39–117)
BILIRUBIN TOTAL: 0.3 mg/dL (ref 0.0–1.2)
BUN/Creatinine Ratio: 28 — ABNORMAL HIGH (ref 11–26)
BUN: 13 mg/dL (ref 8–27)
CALCIUM: 9.9 mg/dL (ref 8.7–10.3)
CO2: 29 mmol/L (ref 18–29)
CREATININE: 0.47 mg/dL — AB (ref 0.57–1.00)
Chloride: 102 mmol/L (ref 96–106)
GFR, EST AFRICAN AMERICAN: 111 mL/min/{1.73_m2} (ref >59)
GFR, EST NON AFRICAN AMERICAN: 96 mL/min/{1.73_m2} (ref >59)
GLUCOSE: 117 mg/dL — AB (ref 65–99)
Globulin, Total: 3.6 g/dL (ref 1.5–4.5)
POTASSIUM: 3.6 mmol/L (ref 3.5–5.2)
SODIUM: 141 mmol/L (ref 134–144)
TOTAL PROTEIN: 7.3 g/dL (ref 6.0–8.5)

## 2015-09-21 LAB — LIPID PANEL
CHOL/HDL RATIO: 3 ratio (ref 0.0–4.4)
Cholesterol, Total: 137 mg/dL (ref 100–199)
HDL: 45 mg/dL (ref >39)
LDL CALC: 63 mg/dL (ref 0–99)
TRIGLYCERIDES: 147 mg/dL (ref 0–149)
VLDL Cholesterol Cal: 29 mg/dL (ref 5–40)

## 2015-09-22 DIAGNOSIS — M6281 Muscle weakness (generalized): Secondary | ICD-10-CM | POA: Diagnosis not present

## 2015-09-22 DIAGNOSIS — M25552 Pain in left hip: Secondary | ICD-10-CM | POA: Diagnosis not present

## 2015-09-22 DIAGNOSIS — S72042D Displaced fracture of base of neck of left femur, subsequent encounter for closed fracture with routine healing: Secondary | ICD-10-CM | POA: Diagnosis not present

## 2015-09-22 DIAGNOSIS — M62561 Muscle wasting and atrophy, not elsewhere classified, right lower leg: Secondary | ICD-10-CM | POA: Diagnosis not present

## 2015-09-22 DIAGNOSIS — S32512D Fracture of superior rim of left pubis, subsequent encounter for fracture with routine healing: Secondary | ICD-10-CM | POA: Diagnosis not present

## 2015-09-22 DIAGNOSIS — M62562 Muscle wasting and atrophy, not elsewhere classified, left lower leg: Secondary | ICD-10-CM | POA: Diagnosis not present

## 2015-09-23 ENCOUNTER — Telehealth: Payer: Self-pay | Admitting: Nurse Practitioner

## 2015-09-23 DIAGNOSIS — M62561 Muscle wasting and atrophy, not elsewhere classified, right lower leg: Secondary | ICD-10-CM | POA: Diagnosis not present

## 2015-09-23 DIAGNOSIS — S32512D Fracture of superior rim of left pubis, subsequent encounter for fracture with routine healing: Secondary | ICD-10-CM | POA: Diagnosis not present

## 2015-09-23 DIAGNOSIS — M25552 Pain in left hip: Secondary | ICD-10-CM | POA: Diagnosis not present

## 2015-09-23 DIAGNOSIS — M6281 Muscle weakness (generalized): Secondary | ICD-10-CM | POA: Diagnosis not present

## 2015-09-23 DIAGNOSIS — M62562 Muscle wasting and atrophy, not elsewhere classified, left lower leg: Secondary | ICD-10-CM | POA: Diagnosis not present

## 2015-09-23 DIAGNOSIS — S72042D Displaced fracture of base of neck of left femur, subsequent encounter for closed fracture with routine healing: Secondary | ICD-10-CM | POA: Diagnosis not present

## 2015-09-23 NOTE — Telephone Encounter (Signed)
Patient was calling wanting to know if we knew a Dr. Dossie Der. Patient advised that I do not see where we had referred her to this DR.

## 2015-09-26 DIAGNOSIS — M62562 Muscle wasting and atrophy, not elsewhere classified, left lower leg: Secondary | ICD-10-CM | POA: Diagnosis not present

## 2015-09-26 DIAGNOSIS — M25552 Pain in left hip: Secondary | ICD-10-CM | POA: Diagnosis not present

## 2015-09-26 DIAGNOSIS — M62561 Muscle wasting and atrophy, not elsewhere classified, right lower leg: Secondary | ICD-10-CM | POA: Diagnosis not present

## 2015-09-26 DIAGNOSIS — S32512D Fracture of superior rim of left pubis, subsequent encounter for fracture with routine healing: Secondary | ICD-10-CM | POA: Diagnosis not present

## 2015-09-26 DIAGNOSIS — S72042D Displaced fracture of base of neck of left femur, subsequent encounter for closed fracture with routine healing: Secondary | ICD-10-CM | POA: Diagnosis not present

## 2015-09-26 DIAGNOSIS — M6281 Muscle weakness (generalized): Secondary | ICD-10-CM | POA: Diagnosis not present

## 2015-09-28 DIAGNOSIS — D8689 Sarcoidosis of other sites: Secondary | ICD-10-CM | POA: Diagnosis not present

## 2015-09-28 DIAGNOSIS — R29898 Other symptoms and signs involving the musculoskeletal system: Secondary | ICD-10-CM | POA: Diagnosis not present

## 2015-09-28 DIAGNOSIS — M47816 Spondylosis without myelopathy or radiculopathy, lumbar region: Secondary | ICD-10-CM | POA: Diagnosis not present

## 2015-09-29 DIAGNOSIS — M62561 Muscle wasting and atrophy, not elsewhere classified, right lower leg: Secondary | ICD-10-CM | POA: Diagnosis not present

## 2015-09-29 DIAGNOSIS — M25552 Pain in left hip: Secondary | ICD-10-CM | POA: Diagnosis not present

## 2015-09-29 DIAGNOSIS — S72042D Displaced fracture of base of neck of left femur, subsequent encounter for closed fracture with routine healing: Secondary | ICD-10-CM | POA: Diagnosis not present

## 2015-09-29 DIAGNOSIS — M62562 Muscle wasting and atrophy, not elsewhere classified, left lower leg: Secondary | ICD-10-CM | POA: Diagnosis not present

## 2015-09-29 DIAGNOSIS — S32512D Fracture of superior rim of left pubis, subsequent encounter for fracture with routine healing: Secondary | ICD-10-CM | POA: Diagnosis not present

## 2015-09-29 DIAGNOSIS — M6281 Muscle weakness (generalized): Secondary | ICD-10-CM | POA: Diagnosis not present

## 2015-09-30 DIAGNOSIS — M6281 Muscle weakness (generalized): Secondary | ICD-10-CM | POA: Diagnosis not present

## 2015-09-30 DIAGNOSIS — M62561 Muscle wasting and atrophy, not elsewhere classified, right lower leg: Secondary | ICD-10-CM | POA: Diagnosis not present

## 2015-09-30 DIAGNOSIS — M25552 Pain in left hip: Secondary | ICD-10-CM | POA: Diagnosis not present

## 2015-09-30 DIAGNOSIS — S32512D Fracture of superior rim of left pubis, subsequent encounter for fracture with routine healing: Secondary | ICD-10-CM | POA: Diagnosis not present

## 2015-09-30 DIAGNOSIS — M62562 Muscle wasting and atrophy, not elsewhere classified, left lower leg: Secondary | ICD-10-CM | POA: Diagnosis not present

## 2015-09-30 DIAGNOSIS — S72042D Displaced fracture of base of neck of left femur, subsequent encounter for closed fracture with routine healing: Secondary | ICD-10-CM | POA: Diagnosis not present

## 2015-10-04 DIAGNOSIS — M62562 Muscle wasting and atrophy, not elsewhere classified, left lower leg: Secondary | ICD-10-CM | POA: Diagnosis not present

## 2015-10-04 DIAGNOSIS — S72042D Displaced fracture of base of neck of left femur, subsequent encounter for closed fracture with routine healing: Secondary | ICD-10-CM | POA: Diagnosis not present

## 2015-10-04 DIAGNOSIS — S32512D Fracture of superior rim of left pubis, subsequent encounter for fracture with routine healing: Secondary | ICD-10-CM | POA: Diagnosis not present

## 2015-10-04 DIAGNOSIS — M6281 Muscle weakness (generalized): Secondary | ICD-10-CM | POA: Diagnosis not present

## 2015-10-04 DIAGNOSIS — M25552 Pain in left hip: Secondary | ICD-10-CM | POA: Diagnosis not present

## 2015-10-04 DIAGNOSIS — M62561 Muscle wasting and atrophy, not elsewhere classified, right lower leg: Secondary | ICD-10-CM | POA: Diagnosis not present

## 2015-10-06 DIAGNOSIS — M25552 Pain in left hip: Secondary | ICD-10-CM | POA: Diagnosis not present

## 2015-10-06 DIAGNOSIS — M6281 Muscle weakness (generalized): Secondary | ICD-10-CM | POA: Diagnosis not present

## 2015-10-06 DIAGNOSIS — S32512D Fracture of superior rim of left pubis, subsequent encounter for fracture with routine healing: Secondary | ICD-10-CM | POA: Diagnosis not present

## 2015-10-06 DIAGNOSIS — S72042D Displaced fracture of base of neck of left femur, subsequent encounter for closed fracture with routine healing: Secondary | ICD-10-CM | POA: Diagnosis not present

## 2015-10-06 DIAGNOSIS — M62562 Muscle wasting and atrophy, not elsewhere classified, left lower leg: Secondary | ICD-10-CM | POA: Diagnosis not present

## 2015-10-06 DIAGNOSIS — M62561 Muscle wasting and atrophy, not elsewhere classified, right lower leg: Secondary | ICD-10-CM | POA: Diagnosis not present

## 2015-10-10 DIAGNOSIS — S72042D Displaced fracture of base of neck of left femur, subsequent encounter for closed fracture with routine healing: Secondary | ICD-10-CM | POA: Diagnosis not present

## 2015-10-10 DIAGNOSIS — M62562 Muscle wasting and atrophy, not elsewhere classified, left lower leg: Secondary | ICD-10-CM | POA: Diagnosis not present

## 2015-10-10 DIAGNOSIS — M25552 Pain in left hip: Secondary | ICD-10-CM | POA: Diagnosis not present

## 2015-10-10 DIAGNOSIS — M6281 Muscle weakness (generalized): Secondary | ICD-10-CM | POA: Diagnosis not present

## 2015-10-10 DIAGNOSIS — M62561 Muscle wasting and atrophy, not elsewhere classified, right lower leg: Secondary | ICD-10-CM | POA: Diagnosis not present

## 2015-10-10 DIAGNOSIS — S32512D Fracture of superior rim of left pubis, subsequent encounter for fracture with routine healing: Secondary | ICD-10-CM | POA: Diagnosis not present

## 2015-10-11 ENCOUNTER — Other Ambulatory Visit: Payer: Self-pay | Admitting: Nurse Practitioner

## 2015-10-11 DIAGNOSIS — M25552 Pain in left hip: Secondary | ICD-10-CM | POA: Diagnosis not present

## 2015-10-11 DIAGNOSIS — M6281 Muscle weakness (generalized): Secondary | ICD-10-CM | POA: Diagnosis not present

## 2015-10-11 DIAGNOSIS — S32512D Fracture of superior rim of left pubis, subsequent encounter for fracture with routine healing: Secondary | ICD-10-CM | POA: Diagnosis not present

## 2015-10-11 DIAGNOSIS — M62562 Muscle wasting and atrophy, not elsewhere classified, left lower leg: Secondary | ICD-10-CM | POA: Diagnosis not present

## 2015-10-11 DIAGNOSIS — M62561 Muscle wasting and atrophy, not elsewhere classified, right lower leg: Secondary | ICD-10-CM | POA: Diagnosis not present

## 2015-10-11 DIAGNOSIS — S72042D Displaced fracture of base of neck of left femur, subsequent encounter for closed fracture with routine healing: Secondary | ICD-10-CM | POA: Diagnosis not present

## 2015-10-12 ENCOUNTER — Ambulatory Visit (INDEPENDENT_AMBULATORY_CARE_PROVIDER_SITE_OTHER): Payer: Medicare Other | Admitting: Diagnostic Neuroimaging

## 2015-10-12 ENCOUNTER — Encounter: Payer: Self-pay | Admitting: Diagnostic Neuroimaging

## 2015-10-12 VITALS — BP 115/65 | HR 65

## 2015-10-12 DIAGNOSIS — D8689 Sarcoidosis of other sites: Secondary | ICD-10-CM | POA: Diagnosis not present

## 2015-10-12 DIAGNOSIS — R269 Unspecified abnormalities of gait and mobility: Secondary | ICD-10-CM | POA: Diagnosis not present

## 2015-10-12 DIAGNOSIS — R29898 Other symptoms and signs involving the musculoskeletal system: Secondary | ICD-10-CM

## 2015-10-12 NOTE — Patient Instructions (Signed)
Thank you for coming to see [place] at [facility]. I hope we have been able to provide you high quality care today.  You may receive a patient satisfaction survey over the next few weeks. We would appreciate your feedback and comments so that we may continue to improve ourselves and the health of our patients.  - I will check MRI scans - follow up with Dr. Dossie Der   ~~~~~~~~~~~~~~~~~~~~~~~~~~~~~~~~~~~~~~~~~~~~~~~~~~~~~~~~~~~~~~~~~  DR. Nastacia Raybuck'S GUIDE TO HAPPY AND HEALTHY LIVING These are some of my general health and wellness recommendations. Some of them may apply to you better than others. Please use common sense as you try these suggestions and feel free to ask me any questions.   ACTIVITY/FITNESS Mental, social, emotional and physical stimulation are very important for brain and body health. Try learning a new activity (arts, music, language, sports, games).  Keep moving your body to the best of your abilities. You can do this at home, inside or outside, the park, community center, gym or anywhere you like. Consider a physical therapist or personal trainer to get started. Consider the app Sworkit. Fitness trackers such as smart-watches, smart-phones or Fitbits can help as well.   NUTRITION Eat more plants: colorful vegetables, nuts, seeds and berries.  Eat less sugar, salt, preservatives and processed foods.  Avoid toxins such as cigarettes and alcohol.  Drink water when you are thirsty. Warm water with a slice of lemon is an excellent morning drink to start the day.  Consider these websites for more information The Nutrition Source (https://www.henry-hernandez.biz/) Precision Nutrition (WindowBlog.ch)   RELAXATION Consider practicing mindfulness meditation or other relaxation techniques such as deep breathing, prayer, yoga, tai chi, massage. See website mindful.org or the apps Headspace or Calm to help get  started.   SLEEP Try to get at least 7-8+ hours sleep per day. Regular exercise and reduced caffeine will help you sleep better. Practice good sleep hygeine techniques. See website sleep.org for more information.   PLANNING Prepare estate planning, living will, healthcare POA documents. Sometimes this is best planned with the help of an attorney. Theconversationproject.org and agingwithdignity.org are excellent resources.

## 2015-10-12 NOTE — Progress Notes (Signed)
[facility]  PATIENT: Elizabeth Drake DOB: 10-18-1940  REFERRING CLINICIAN: Arlyce Harman HISTORY FROM: patient and husband (EPIC chart review; outside note review) REASON FOR VISIT: new consult    HISTORICAL  CHIEF COMPLAINT:  Chief Complaint  Patient presents with  . 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  Around 2001 patient developed increasing gait and balance difficulty. MRI scans around that time demonstrated evidence of neurosarcoidosis affecting the spinal cord. Patient started having problems with gait and balance. She initially used walls and furniture to help her with her balance and walking. Over the years her symptoms progressively worsened. She previously was evaluated and treated by rheumatologist Dr. Justine Null, and later by Dr. Ouida Sills. Now under care of Dr. Dossie Der in Feb 2017. In the past patient was treated with prednisone chronically, at times low dose 5 mg, at times with higher dose for flareups.  In November 2016 patient had a fall resulting in left hip fracture. She had hospital admission, surgical repair and then rehabilitation at skilled nursing facility. Patient now living back at home.  Patient saw a new rheumatologist recently who prescribed prednisone Dosepak and referred patient to see me in neurology. Patient has not taken her prednisone dose pack yet. She continues on 5 g daily. She says that she is very busy with doctor's appointments, physical therapy, home health and makes other excuses about why she has not started the higher dose prednisone. She has lost energy, motivation, desire to improve her situation. She is not sure what direction to go in. There may be some  depression according to patient and husband.    REVIEW OF SYSTEMS: Full 14 system review of systems performed and negative with exception of: Weakness easy bruising joint pain. Diabetes since 2015.  ALLERGIES: Allergies  Allergen Reactions  . Advil [Ibuprofen] Shortness Of Breath and Swelling  . Propoxyphene Swelling  . Azo [Phenazopyridine] Other (See Comments)    Unknown reaction  . Indocin [Indomethacin] Swelling and Other (See Comments)    Unknown reaction  . Keflex [Cephalexin] Other (See Comments)  . Loratadine Other (See Comments)    unknown  . Percocet [Oxycodone-Acetaminophen] Itching    10/12/15 Patient denies allergy to APAP    HOME MEDICATIONS: Outpatient Prescriptions Prior to Visit  Medication Sig Dispense Refill  . acetaminophen (TYLENOL) 500 MG tablet Take 2 tablets (1,000 mg total) by mouth every 8 (eight) hours.    . calcium-vitamin D (OSCAL 500/200 D-3) 500-200 MG-UNIT per tablet Take 1 tablet by mouth daily with breakfast.    . feeding supplement (BOOST / RESOURCE BREEZE) LIQD Take 1 Container by mouth 2 (two) times daily between meals.  0  . glimepiride (AMARYL) 2 MG tablet Take 1 tablet (2 mg total) by mouth daily. 30 tablet 5  . metFORMIN (GLUCOPHAGE) 500 MG tablet TAKE 1 TABLET EVERY DAY 30 tablet 6  . metoprolol tartrate (LOPRESSOR) 25 MG tablet Take 1 tablet (25 mg total) by mouth 2 (two) times daily. 60 tablet 5  . mometasone (NASONEX) 50 MCG/ACT nasal spray 2 sprays per nostril qd (Patient taking differently: Place 2 sprays into the nose daily as needed (allergies). 2 sprays per nostril qd) 17 g 12  . oxyCODONE (OXYCONTIN) 15  mg 12 hr tablet Take one tablet by mouth every 12 hours for pain 60 tablet 0  . polyethylene glycol (MIRALAX / GLYCOLAX) packet Take 17 g by mouth daily as needed.    . predniSONE (DELTASONE) 5 MG tablet Take 1 tablet (5 mg total) by mouth daily with breakfast. 30 tablet 5  . rivaroxaban (XARELTO) 20 MG TABS tablet TAKE ONE TABLET BY  MOUTH ONE TIME DAILY WITH SUPPER 30 tablet 5  . senna (SENOKOT) 8.6 MG TABS tablet Take 2 tablets (17.2 mg total) by mouth at bedtime.     No facility-administered medications prior to visit.    PAST MEDICAL HISTORY: Past Medical History  Diagnosis Date  . Sarcoidosis ([place])   . Hiatal hernia   . Diabetes mellitus without complication ([place])   . Osteoporosis   . DVT (deep venous thrombosis) ([place])   . Pulmonary emboli ([place])     PAST SURGICAL HISTORY: Past Surgical History  Procedure Laterality Date  . Abdominal hysterectomy    . Joint replacement      toe  . Lung lobectomy Left     Lower  . I&d extremity Left 09/13/2014    Procedure: IRRIGATION AND DEBRIDEMENT EXTREMITY;  Surgeon: Linna Hoff, MD;  Location: [facility];  Service: Orthopedics;  Laterality: Left;  . Intramedullary (im) nail intertrochanteric Left 06/27/2015    Procedure: INTRAMEDULLARY (IM) NAIL INTERTROCHANTRIC AFFIXIS;  Surgeon: Marybelle Killings, MD;  Location: [facility];  Service: Orthopedics;  Laterality: Left;    FAMILY HISTORY: Family History  Problem Relation Age of Onset  . Heart disease Mother   . Hypertension Mother   . Cancer Father     pancratic  . Cancer - Other Sister     SOCIAL HISTORY:  Social History   Social History  . Marital Status: Married    Spouse Name: Lake Bells  . Number of Children: 3  . Years of Education: 12+   Occupational History  .      retired from [employer]   Social History Main Topics  . Smoking status: Former Smoker    Types: Cigarettes    Quit date: 12/31/2000  . Smokeless tobacco: Not on file  . Alcohol Use: No  . Drug Use: No  . Sexual Activity: Not on file   Other Topics Concern  . Not on file   Social History Narrative   Lives at home with husband   caffeine use- none     PHYSICAL EXAM   GENERAL EXAM/CONSTITUTIONAL: Vitals:  Filed Vitals:   10/12/15 1111  BP: 115/65  Pulse: 65     There is no weight on file to calculate BMI.  Vision Screening  Comments: 10/12/15 unable to stand up for vision screen  Patient is in no distress; well developed, nourished and groomed; neck is supple  CARDIOVASCULAR:  Examination of carotid arteries is normal; no carotid bruits  Regular rate and rhythm, no murmurs  Examination of peripheral vascular system by observation and palpation is normal  EYES:  Ophthalmoscopic exam of optic discs and posterior segments is normal; no papilledema or hemorrhages  MUSCULOSKELETAL:  Gait, strength, tone, movements noted in Neurologic exam below  NEUROLOGIC: MENTAL STATUS:  No flowsheet data found.  awake, alert, oriented to person, place and time  recent and remote memory intact  normal attention and concentration  language fluent, comprehension intact, naming intact,   fund of knowledge appropriate  CRANIAL NERVE:   2nd - no papilledema on fundoscopic exam  2nd, 3rd,  4th, 6th - pupils equal and reactive to light, visual fields full to confrontation, extraocular muscles intact, no nystagmus  5th - facial sensation symmetric  7th - facial strength symmetric  8th - hearing intact  9th - palate elevates symmetrically, uvula midline  11th - shoulder shrug symmetric  12th - tongue protrusion midline  VOICE TREMOR  MOTOR:   normal bulk and tone; BUE 4; RLE (HF 0, KE 3, KF 3, DF 2); LLE 4  SENSORY:   normal and symmetric to light touch; DECR PP, VIB , TEMP IN FEET AND HANDS  COORDINATION:   finger-nose-finger, fine finger movements SLOW; LUE TREMOR  REFLEXES:   deep tendon reflexes BUE TRACE; BLE 0  GAIT/STATION:   IN WHEEL CHAIR; CANNOT STAND    DIAGNOSTIC DATA (LABS, IMAGING, TESTING) - I reviewed patient records, labs, notes, testing and imaging myself where available.  Lab Results  Component Value Date   WBC 6.7 06/30/2015   HGB 8.7* 06/30/2015   HCT 26.8* 06/30/2015   MCV 95.7 06/30/2015   PLT 158 06/30/2015      Component Value Date/Time   NA 141  09/20/2015 1130   NA 136 06/29/2015 0549   K 3.6 09/20/2015 1130   CL 102 09/20/2015 1130   CO2 29 09/20/2015 1130   GLUCOSE 117* 09/20/2015 1130   GLUCOSE 198* 06/29/2015 0549   BUN 13 09/20/2015 1130   BUN 14 06/29/2015 0549   CREATININE 0.47* 09/20/2015 1130   CREATININE 0.62 12/31/2012 1210   CALCIUM 9.9 09/20/2015 1130   PROT 7.3 09/20/2015 1130   PROT 5.8* 06/28/2015 0915   ALBUMIN 3.7 09/20/2015 1130   ALBUMIN 2.3* 06/28/2015 0915   AST 12 09/20/2015 1130   ALT 9 09/20/2015 1130   ALKPHOS 78 09/20/2015 1130   BILITOT 0.3 09/20/2015 1130   BILITOT 0.5 06/28/2015 0915   GFRNONAA 96 09/20/2015 1130   GFRNONAA >89 12/31/2012 1210   GFRAA 111 09/20/2015 1130   GFRAA >89 12/31/2012 1210   Lab Results  Component Value Date   CHOL 137 09/20/2015   HDL 45 09/20/2015   LDLCALC 63 09/20/2015   TRIG 147 09/20/2015   CHOLHDL 3.0 09/20/2015   Lab Results  Component Value Date   HGBA1C 6.2 09/20/2015   No results found for: [id number] Lab Results  Component Value Date   TSH 1.391 09/13/2014    05/20/00 MRI thoracic  1. IN THE REGION WHICH PREVIOUSLY DEMONSTRATED AN OVAL SHAPED RIGHT ECCENTRIC FAIRLY WELL DEFINED LESION IN THE THORACIC CORD AT APPROXIMATELY T2-3, WE DEMONSTRATE A MORE ILL-DEFINED REGION OF INCREASED T2 SIGNAL, BUT CURRENTLY WITHOUT EVIDENCE OF ENHANCEMENT. THE APPEARANCE IS NOW SIMILAR TO THE OTHER AFFECTED AREAS IN THE THORACIC CORD, e.g., THE REGION BETWEEN T5 AND T8. THERE IS NO DEFINITE ENHANCEMENT. 2. CERVICAL SPONDYLOSIS. 3. GOITER.  05/20/00 MRI lumbar  1. NO COMPELLING EVIDENCE OF NEUROSARCOID INVOLVING THE CONUS MEDULLARIS. 2. CENTRAL STENOSIS AT L4-5 DUE TO A BROAD BASED DISK BULGE, FACET OVERGROWTH, AND WHAT APPEARS TO BE A LEFT-SIDED SYNOVIAL CYST. THE COMBINATION OF FINDINGS LEADS TO MASS EFFECT ON THELEFT L5 NERVE ROOT IN THE LATERAL RECESS. THIS IS WELL DEMONSTRATED ON IMAGE #[id number].  3. MILD RIGHT FORAMINAL STENOSIS AT L3-4  SECONDARY TO FACET OVERGROWTH AND BROAD BASED DISK BULGE.  12/10/04 MRI brain  1. Since the prior examination there has been slight progression of nonspecific white matter type changes which may be related to patient's known history of sarcoidosis although other causes of white matter  type changes cannot be completely excluded as a cause for such as noted above. Presently no abnormal intracranial enhancing lesion or acute infarct.  2. Right vertebral artery appears diminutive in size with major intracranial vessels otherwise noted to be patent. The caliber of the left internal carotid artery appears minimally smaller than the right unchanged from the prior exam. This may be normal for this particular patient as versus the result of proximal stenosis  12/10/04 MRI thoracic  1. Relatively similar appearance of scattered areas of altered signal intensity throughout the thoracic spinal cord which are probably related to the patient's sarcoidosis. 2. Minimal progression of degenerative changes most notable T8-9 level.  11/15/07 MRI thoracic  1. Mild central disc bulge T8-9 without significant stenosis. 2. Mild curvature of the thoracic spine is convex to the right atT10-11. 3. Lower cervical spinal spondylosis. 4. Marrow signal is somewhat heterogeneous without focal lesion. This is likely within normal limits.  11/15/07 MRI lumbar  1. Moderate right foraminal and lateral recess narrowing L3-4 due to scoliosis and disc disease. 2. Mild left lateral recess narrowing L4-5. 3. Minimal foraminal narrowing at the L2-3 level, right greater than left. 4. Curvature of the lumbar spine is apex to the left at L3-4.  06/27/15 xray left femur [I reviewed images myself and agree with interpretation. -VRP]  - ORIF of intertrochanteric left femoral fracture as described above.  09/14/14 TTE  - Left ventricle: The cavity size was normal. Wall thickness was increased in a pattern of mild LVH. The  estimated ejection fraction was 60%. Wall motion was normal; there were no regional wall motion abnormalities. - Aortic valve: The valve appears to be grossly normal. There was mild regurgitation. - Mitral valve: There was mild regurgitation. - Left atrium: The atrium was mildly dilated. - Pulmonary arteries: PA peak pressure: 42 mm Hg (S). - Impressions: No cardiac source of embolism was identified, but cannot be ruled out on the basis of this examination.     ASSESSMENT AND PLAN  74 y.o. year old female here with history of neurosarcoidosis, pulmonary sarcoidosis, diabetes, with progressive gait and balance difficulty since 2001, with more severe problems since 2014 and especially November 2016. Likely represents regression of neurosarcoidosis which previously suspected cervical and thoracic spinal cord from prior imaging. Other contributed factors include deconditioning, diabetic neuropathy and depression.   Dx:  1. Neurosarcoidosis ([place])   2. Gait difficulty   3. Severe muscle deconditioning      PLAN: - check MRI scans - agree with prednisone increase per Dr. Dossie Der (patient reluctant about increasing dose)  Orders Placed This Encounter  Procedures  . MR Thoracic Spine W Wo Contrast  . MR Lumbar Spine W Wo Contrast   Return in about 6 weeks (around 11/23/2015).  I reviewed images, labs, notes, records myself. I summarized findings and reviewed with patient, for this high risk condition (falls, gait diff, leg weakness, neurosarcoidosis flare up) requiring high complexity decision making.    Penni Bombard, MD [id number], [id number] PM Certified in Neurology, Neurophysiology and Neuroimaging  [facility] [address], [place], [place] [address] [phone]

## 2015-10-13 DIAGNOSIS — M62562 Muscle wasting and atrophy, not elsewhere classified, left lower leg: Secondary | ICD-10-CM | POA: Diagnosis not present

## 2015-10-13 DIAGNOSIS — M6281 Muscle weakness (generalized): Secondary | ICD-10-CM | POA: Diagnosis not present

## 2015-10-13 DIAGNOSIS — M62561 Muscle wasting and atrophy, not elsewhere classified, right lower leg: Secondary | ICD-10-CM | POA: Diagnosis not present

## 2015-10-13 DIAGNOSIS — S72042D Displaced fracture of base of neck of left femur, subsequent encounter for closed fracture with routine healing: Secondary | ICD-10-CM | POA: Diagnosis not present

## 2015-10-13 DIAGNOSIS — S32512D Fracture of superior rim of left pubis, subsequent encounter for fracture with routine healing: Secondary | ICD-10-CM | POA: Diagnosis not present

## 2015-10-13 DIAGNOSIS — M25552 Pain in left hip: Secondary | ICD-10-CM | POA: Diagnosis not present

## 2015-10-14 DIAGNOSIS — S32512D Fracture of superior rim of left pubis, subsequent encounter for fracture with routine healing: Secondary | ICD-10-CM | POA: Diagnosis not present

## 2015-10-14 DIAGNOSIS — S72042D Displaced fracture of base of neck of left femur, subsequent encounter for closed fracture with routine healing: Secondary | ICD-10-CM | POA: Diagnosis not present

## 2015-10-14 DIAGNOSIS — M62561 Muscle wasting and atrophy, not elsewhere classified, right lower leg: Secondary | ICD-10-CM | POA: Diagnosis not present

## 2015-10-14 DIAGNOSIS — M25552 Pain in left hip: Secondary | ICD-10-CM | POA: Diagnosis not present

## 2015-10-14 DIAGNOSIS — M62562 Muscle wasting and atrophy, not elsewhere classified, left lower leg: Secondary | ICD-10-CM | POA: Diagnosis not present

## 2015-10-14 DIAGNOSIS — M6281 Muscle weakness (generalized): Secondary | ICD-10-CM | POA: Diagnosis not present

## 2015-10-17 DIAGNOSIS — M25552 Pain in left hip: Secondary | ICD-10-CM | POA: Diagnosis not present

## 2015-10-17 DIAGNOSIS — M62561 Muscle wasting and atrophy, not elsewhere classified, right lower leg: Secondary | ICD-10-CM | POA: Diagnosis not present

## 2015-10-17 DIAGNOSIS — S32512D Fracture of superior rim of left pubis, subsequent encounter for fracture with routine healing: Secondary | ICD-10-CM | POA: Diagnosis not present

## 2015-10-17 DIAGNOSIS — M62562 Muscle wasting and atrophy, not elsewhere classified, left lower leg: Secondary | ICD-10-CM | POA: Diagnosis not present

## 2015-10-17 DIAGNOSIS — S72042D Displaced fracture of base of neck of left femur, subsequent encounter for closed fracture with routine healing: Secondary | ICD-10-CM | POA: Diagnosis not present

## 2015-10-17 DIAGNOSIS — M6281 Muscle weakness (generalized): Secondary | ICD-10-CM | POA: Diagnosis not present

## 2015-10-18 DIAGNOSIS — R29898 Other symptoms and signs involving the musculoskeletal system: Secondary | ICD-10-CM | POA: Diagnosis not present

## 2015-10-18 DIAGNOSIS — M47816 Spondylosis without myelopathy or radiculopathy, lumbar region: Secondary | ICD-10-CM | POA: Diagnosis not present

## 2015-10-18 DIAGNOSIS — D8689 Sarcoidosis of other sites: Secondary | ICD-10-CM | POA: Diagnosis not present

## 2015-10-19 DIAGNOSIS — S32512D Fracture of superior rim of left pubis, subsequent encounter for fracture with routine healing: Secondary | ICD-10-CM | POA: Diagnosis not present

## 2015-10-19 DIAGNOSIS — M62562 Muscle wasting and atrophy, not elsewhere classified, left lower leg: Secondary | ICD-10-CM | POA: Diagnosis not present

## 2015-10-19 DIAGNOSIS — M6281 Muscle weakness (generalized): Secondary | ICD-10-CM | POA: Diagnosis not present

## 2015-10-19 DIAGNOSIS — M62561 Muscle wasting and atrophy, not elsewhere classified, right lower leg: Secondary | ICD-10-CM | POA: Diagnosis not present

## 2015-10-19 DIAGNOSIS — M25552 Pain in left hip: Secondary | ICD-10-CM | POA: Diagnosis not present

## 2015-10-19 DIAGNOSIS — S72042D Displaced fracture of base of neck of left femur, subsequent encounter for closed fracture with routine healing: Secondary | ICD-10-CM | POA: Diagnosis not present

## 2015-10-21 ENCOUNTER — Other Ambulatory Visit: Payer: Self-pay | Admitting: Nurse Practitioner

## 2015-10-21 DIAGNOSIS — S32512D Fracture of superior rim of left pubis, subsequent encounter for fracture with routine healing: Secondary | ICD-10-CM | POA: Diagnosis not present

## 2015-10-21 DIAGNOSIS — M6281 Muscle weakness (generalized): Secondary | ICD-10-CM | POA: Diagnosis not present

## 2015-10-21 DIAGNOSIS — M62561 Muscle wasting and atrophy, not elsewhere classified, right lower leg: Secondary | ICD-10-CM | POA: Diagnosis not present

## 2015-10-21 DIAGNOSIS — M62562 Muscle wasting and atrophy, not elsewhere classified, left lower leg: Secondary | ICD-10-CM | POA: Diagnosis not present

## 2015-10-21 DIAGNOSIS — S72042D Displaced fracture of base of neck of left femur, subsequent encounter for closed fracture with routine healing: Secondary | ICD-10-CM | POA: Diagnosis not present

## 2015-10-21 DIAGNOSIS — M25552 Pain in left hip: Secondary | ICD-10-CM | POA: Diagnosis not present

## 2015-10-21 MED ORDER — HYDROCODONE-ACETAMINOPHEN 10-325 MG PO TABS: 1.0000 | ORAL_TABLET | Freq: Two times a day (BID) | ORAL | Status: DC

## 2015-10-21 NOTE — Telephone Encounter (Signed)
rx ready for pickup 

## 2015-10-24 DIAGNOSIS — S32512D Fracture of superior rim of left pubis, subsequent encounter for fracture with routine healing: Secondary | ICD-10-CM | POA: Diagnosis not present

## 2015-10-24 DIAGNOSIS — S72042D Displaced fracture of base of neck of left femur, subsequent encounter for closed fracture with routine healing: Secondary | ICD-10-CM | POA: Diagnosis not present

## 2015-10-24 DIAGNOSIS — M62562 Muscle wasting and atrophy, not elsewhere classified, left lower leg: Secondary | ICD-10-CM | POA: Diagnosis not present

## 2015-10-24 DIAGNOSIS — M62561 Muscle wasting and atrophy, not elsewhere classified, right lower leg: Secondary | ICD-10-CM | POA: Diagnosis not present

## 2015-10-24 DIAGNOSIS — M25552 Pain in left hip: Secondary | ICD-10-CM | POA: Diagnosis not present

## 2015-10-24 DIAGNOSIS — M6281 Muscle weakness (generalized): Secondary | ICD-10-CM | POA: Diagnosis not present

## 2015-10-24 NOTE — Telephone Encounter (Signed)
Pt is aware and has already picked it up.

## 2015-10-26 DIAGNOSIS — M6281 Muscle weakness (generalized): Secondary | ICD-10-CM | POA: Diagnosis not present

## 2015-10-26 DIAGNOSIS — M25552 Pain in left hip: Secondary | ICD-10-CM | POA: Diagnosis not present

## 2015-10-26 DIAGNOSIS — M62561 Muscle wasting and atrophy, not elsewhere classified, right lower leg: Secondary | ICD-10-CM | POA: Diagnosis not present

## 2015-10-26 DIAGNOSIS — M62562 Muscle wasting and atrophy, not elsewhere classified, left lower leg: Secondary | ICD-10-CM | POA: Diagnosis not present

## 2015-10-26 DIAGNOSIS — S72042D Displaced fracture of base of neck of left femur, subsequent encounter for closed fracture with routine healing: Secondary | ICD-10-CM | POA: Diagnosis not present

## 2015-10-26 DIAGNOSIS — S32512D Fracture of superior rim of left pubis, subsequent encounter for fracture with routine healing: Secondary | ICD-10-CM | POA: Diagnosis not present

## 2015-10-31 DIAGNOSIS — M62562 Muscle wasting and atrophy, not elsewhere classified, left lower leg: Secondary | ICD-10-CM | POA: Diagnosis not present

## 2015-10-31 DIAGNOSIS — M6281 Muscle weakness (generalized): Secondary | ICD-10-CM | POA: Diagnosis not present

## 2015-10-31 DIAGNOSIS — S72042D Displaced fracture of base of neck of left femur, subsequent encounter for closed fracture with routine healing: Secondary | ICD-10-CM | POA: Diagnosis not present

## 2015-10-31 DIAGNOSIS — S32512D Fracture of superior rim of left pubis, subsequent encounter for fracture with routine healing: Secondary | ICD-10-CM | POA: Diagnosis not present

## 2015-10-31 DIAGNOSIS — M25552 Pain in left hip: Secondary | ICD-10-CM | POA: Diagnosis not present

## 2015-10-31 DIAGNOSIS — M62561 Muscle wasting and atrophy, not elsewhere classified, right lower leg: Secondary | ICD-10-CM | POA: Diagnosis not present

## 2015-11-04 DIAGNOSIS — M62561 Muscle wasting and atrophy, not elsewhere classified, right lower leg: Secondary | ICD-10-CM | POA: Diagnosis not present

## 2015-11-04 DIAGNOSIS — M6281 Muscle weakness (generalized): Secondary | ICD-10-CM | POA: Diagnosis not present

## 2015-11-04 DIAGNOSIS — M62562 Muscle wasting and atrophy, not elsewhere classified, left lower leg: Secondary | ICD-10-CM | POA: Diagnosis not present

## 2015-11-04 DIAGNOSIS — S32512D Fracture of superior rim of left pubis, subsequent encounter for fracture with routine healing: Secondary | ICD-10-CM | POA: Diagnosis not present

## 2015-11-04 DIAGNOSIS — S72042D Displaced fracture of base of neck of left femur, subsequent encounter for closed fracture with routine healing: Secondary | ICD-10-CM | POA: Diagnosis not present

## 2015-11-04 DIAGNOSIS — M25552 Pain in left hip: Secondary | ICD-10-CM | POA: Diagnosis not present

## 2015-11-07 ENCOUNTER — Other Ambulatory Visit: Payer: Self-pay | Admitting: Nurse Practitioner

## 2015-11-07 NOTE — Telephone Encounter (Signed)
Patient aware.

## 2015-11-07 NOTE — Telephone Encounter (Signed)
Looks like its too early?

## 2015-11-07 NOTE — Telephone Encounter (Signed)
Agree to early cannot get until 11/20/15

## 2015-11-07 NOTE — Telephone Encounter (Signed)
Pt called and i advised she can't get a refill for another 12 days.

## 2015-11-08 ENCOUNTER — Other Ambulatory Visit: Payer: Self-pay | Admitting: Diagnostic Neuroimaging

## 2015-11-08 ENCOUNTER — Ambulatory Visit
Admission: RE | Admit: 2015-11-08 | Discharge: 2015-11-08 | Disposition: A | Discharge status: Home / Self Care | Payer: Medicare Other | Source: Ambulatory Visit | Attending: Diagnostic Neuroimaging | Admitting: Diagnostic Neuroimaging

## 2015-11-08 DIAGNOSIS — R29898 Other symptoms and signs involving the musculoskeletal system: Secondary | ICD-10-CM

## 2015-11-08 DIAGNOSIS — R269 Unspecified abnormalities of gait and mobility: Secondary | ICD-10-CM

## 2015-11-08 DIAGNOSIS — D8689 Sarcoidosis of other sites: Secondary | ICD-10-CM

## 2015-11-11 ENCOUNTER — Telehealth: Payer: Self-pay | Admitting: Nurse Practitioner

## 2015-11-12 MED ORDER — HYDROCODONE-ACETAMINOPHEN 10-325 MG PO TABS: 1.0000 | ORAL_TABLET | Freq: Two times a day (BID) | ORAL | Status: DC | PRN

## 2015-11-12 NOTE — Telephone Encounter (Signed)
Husband believes she has thrown away pills, I have given temporary supply and asked them to follow up with PCP next week.   Explained personally that in general needs to be seen for refills.   Laroy Apple, MD [facility] 11/12/2015, 10:48 AM

## 2015-11-14 NOTE — Telephone Encounter (Signed)
Checked on patient and told her to be very careful with pain meds because cannot continue to refill early

## 2015-11-16 DIAGNOSIS — M47816 Spondylosis without myelopathy or radiculopathy, lumbar region: Secondary | ICD-10-CM | POA: Diagnosis not present

## 2015-11-16 DIAGNOSIS — R29898 Other symptoms and signs involving the musculoskeletal system: Secondary | ICD-10-CM | POA: Diagnosis not present

## 2015-11-16 DIAGNOSIS — D8689 Sarcoidosis of other sites: Secondary | ICD-10-CM | POA: Diagnosis not present

## 2015-11-21 ENCOUNTER — Telehealth: Payer: Self-pay | Admitting: Nurse Practitioner

## 2015-11-21 MED ORDER — HYDROCODONE-ACETAMINOPHEN 10-325 MG PO TABS: 1.0000 | ORAL_TABLET | Freq: Two times a day (BID) | ORAL | Status: DC | PRN

## 2015-11-21 NOTE — Telephone Encounter (Signed)
Pt is aware and rx placed up front.

## 2015-11-21 NOTE — Telephone Encounter (Signed)
[facility] ready for pick up  

## 2015-11-23 ENCOUNTER — Encounter: Payer: Self-pay | Admitting: Diagnostic Neuroimaging

## 2015-11-23 ENCOUNTER — Ambulatory Visit (INDEPENDENT_AMBULATORY_CARE_PROVIDER_SITE_OTHER): Payer: Medicare Other | Admitting: Diagnostic Neuroimaging

## 2015-11-23 VITALS — BP 135/68 | HR 83

## 2015-11-23 DIAGNOSIS — R29898 Other symptoms and signs involving the musculoskeletal system: Secondary | ICD-10-CM

## 2015-11-23 DIAGNOSIS — R4589 Other symptoms and signs involving emotional state: Secondary | ICD-10-CM

## 2015-11-23 DIAGNOSIS — F329 Major depressive disorder, single episode, unspecified: Secondary | ICD-10-CM

## 2015-11-23 DIAGNOSIS — G894 Chronic pain syndrome: Secondary | ICD-10-CM

## 2015-11-23 DIAGNOSIS — R269 Unspecified abnormalities of gait and mobility: Secondary | ICD-10-CM

## 2015-11-23 DIAGNOSIS — D8689 Sarcoidosis of other sites: Secondary | ICD-10-CM | POA: Diagnosis not present

## 2015-11-23 NOTE — Patient Instructions (Signed)
-   continue current medications - follow up with PCP

## 2015-11-23 NOTE — Progress Notes (Signed)
[facility]  PATIENT: Elizabeth Drake DOB: [birth year]  REFERRING CLINICIAN: Arlyce Harman HISTORY FROM: patient and husband (EPIC chart review; outside note review) REASON FOR VISIT: follow up    HISTORICAL  CHIEF COMPLAINT:  Chief Complaint  Patient presents with  . Neurosarcoidosis    rm 7, husband- Lake Bells, " no change in past 6 weeks"  . Follow-up    6 week    HISTORY OF PRESENT ILLNESS:   UPDATE 11/23/15: Since last visit, had MRI scans (she declined IV contrast). Some pregression of cord lesions noted.   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 Around 2001 patient developed increasing gait and balance difficulty. MRI scans around that time demonstrated evidence of neurosarcoidosis affecting the spinal cord. Patient started having problems with gait and balance. She initially used walls and furniture to help her with her balance and walking. Over the years her symptoms progressively worsened. She previously was evaluated and treated by rheumatologist Dr. Justine Null, and later by Dr. Ouida Sills. Now under care of Dr. Dossie Der in Feb 2017. In the past patient was treated with prednisone chronically, at times low dose 5 mg, at times with higher dose for flareups. In November 2016 patient had a fall resulting in left hip fracture. She had hospital admission, surgical repair and then rehabilitation at skilled nursing facility. Patient now living back at home. Patient saw a new rheumatologist recently who prescribed prednisone Dosepak and referred patient to see me in neurology. Patient has not taken her prednisone dose pack yet. She continues on 5 mg daily. She says that she is very busy with doctor's appointments, physical therapy, home health and makes other excuses about why she has not started the higher dose prednisone. She has lost energy,  motivation, desire to improve her situation. She is not sure what direction to go in. There may be some depression according to patient and husband.    REVIEW OF SYSTEMS: Full 14 system review of systems performed and negative with exception of: Weakness easy bruising joint pain. Diabetes since 2015.  ALLERGIES: Allergies  Allergen Reactions  . Advil [Ibuprofen] Shortness Of Breath and Swelling  . Propoxyphene Swelling  . Azo [Phenazopyridine] Other (See Comments)    Unknown reaction  . Indocin [Indomethacin] Swelling and Other (See Comments)    Unknown reaction  . Keflex [Cephalexin] Other (See Comments)  . Loratadine Other (See Comments)    unknown  . Percocet [Oxycodone-Acetaminophen] Itching    10/12/15 Patient denies allergy to APAP    HOME MEDICATIONS: Outpatient Prescriptions Prior to Visit  Medication Sig Dispense Refill  . acetaminophen (TYLENOL) 500 MG tablet Take 2 tablets (1,000 mg total) by mouth every 8 (eight) hours.    . calcium-vitamin D (OSCAL 500/200 D-3) 500-200 MG-UNIT per tablet Take 1 tablet by mouth daily with breakfast.    . feeding supplement (BOOST / RESOURCE BREEZE) LIQD Take 1 Container by mouth 2 (two) times daily between meals.  0  . glimepiride (AMARYL) 2 MG tablet Take 1 tablet (2 mg total) by mouth daily. 30 tablet 5  . HYDROcodone-acetaminophen (NORCO) 10-325 MG tablet Take 1 tablet by mouth 2 (two) times daily as needed. 60 tablet 0  . metFORMIN (GLUCOPHAGE) 500 MG tablet TAKE 1 TABLET EVERY DAY 30 tablet 6  . metoprolol tartrate (LOPRESSOR) 25 MG tablet Take 1 tablet (25 mg total) by mouth 2 (two) times daily. 60 tablet  5  . mometasone (NASONEX) 50 MCG/ACT nasal spray 2 sprays per nostril qd (Patient taking differently: Place 2 sprays into the nose daily as needed (allergies). 2 sprays per nostril qd) 17 g 12  . polyethylene glycol (MIRALAX / GLYCOLAX) packet Take 17 g by mouth daily as needed.    . predniSONE (DELTASONE) 5 MG tablet Take 1 tablet  (5 mg total) by mouth daily with breakfast. 30 tablet 5  . rivaroxaban (XARELTO) 20 MG TABS tablet TAKE ONE TABLET BY MOUTH ONE TIME DAILY WITH SUPPER 30 tablet 5  . senna (SENOKOT) 8.6 MG TABS tablet Take 2 tablets (17.2 mg total) by mouth at bedtime.     No facility-administered medications prior to visit.    PAST MEDICAL HISTORY: Past Medical History  Diagnosis Date  . Sarcoidosis ([place])   . Hiatal hernia   . Diabetes mellitus without complication ([place])   . Osteoporosis   . DVT (deep venous thrombosis) ([place])   . Pulmonary emboli ([place])     PAST SURGICAL HISTORY: Past Surgical History  Procedure Laterality Date  . Abdominal hysterectomy    . Joint replacement      toe  . Lung lobectomy Left     Lower  . I&d extremity Left 09/13/2014    Procedure: IRRIGATION AND DEBRIDEMENT EXTREMITY;  Surgeon: Linna Hoff, MD;  Location: [facility];  Service: Orthopedics;  Laterality: Left;  . Intramedullary (im) nail intertrochanteric Left 06/27/2015    Procedure: INTRAMEDULLARY (IM) NAIL INTERTROCHANTRIC AFFIXIS;  Surgeon: Marybelle Killings, MD;  Location: [facility];  Service: Orthopedics;  Laterality: Left;    FAMILY HISTORY: Family History  Problem Relation Age of Onset  . Heart disease Mother   . Hypertension Mother   . Cancer Father     pancratic  . Cancer - Other Sister     SOCIAL HISTORY:  Social History   Social History  . Marital Status: Married    Spouse Name: Lake Bells  . Number of Children: 3  . Years of Education: 12+   Occupational History  .      retired from [employer]   Social History Main Topics  . Smoking status: Former Smoker    Types: Cigarettes    Quit date: 12/31/2000  . Smokeless tobacco: Not on file  . Alcohol Use: No  . Drug Use: No  . Sexual Activity: Not on file   Other Topics Concern  . Not on file   Social History Narrative   Lives at home with husband   caffeine use- none     PHYSICAL EXAM  GENERAL EXAM/CONSTITUTIONAL: Vitals:  Filed Vitals:    11/23/15 1300  BP: 135/68  Pulse: 83   There is no weight on file to calculate BMI. No exam data present  Patient is in no distress; well developed, nourished and groomed; neck is supple  CARDIOVASCULAR:  Examination of carotid arteries is normal; no carotid bruits  Regular rate and rhythm, no murmurs  Examination of peripheral vascular system by observation and palpation is normal  EYES:  Ophthalmoscopic exam of optic discs and posterior segments is normal; no papilledema or hemorrhages  MUSCULOSKELETAL:  Gait, strength, tone, movements noted in Neurologic exam below  NEUROLOGIC: MENTAL STATUS:  No flowsheet data found.  awake, alert, oriented to person, place and time  recent and remote memory intact  normal attention and concentration  language fluent, comprehension intact, naming intact,   fund of knowledge appropriate  CRANIAL NERVE:   2nd - no  papilledema on fundoscopic exam  2nd, 3rd, 4th, 6th - pupils equal and reactive to light, visual fields full to confrontation, extraocular muscles intact, no nystagmus  5th - facial sensation symmetric  7th - facial strength symmetric  8th - hearing intact  9th - palate elevates symmetrically, uvula midline  11th - shoulder shrug symmetric  12th - tongue protrusion midline  VOICE TREMOR  MOTOR:   normal bulk and tone; BUE 4; RLE (HF 0, KE 3, KF 3, DF 2); LLE 4  SENSORY:   normal and symmetric to light touch; DECR PP, VIB , TEMP IN FEET AND HANDS  COORDINATION:   finger-nose-finger, fine finger movements SLOW; LUE TREMOR  REFLEXES:   deep tendon reflexes BUE TRACE; BLE 0  GAIT/STATION:   IN WHEEL CHAIR; CANNOT STAND    DIAGNOSTIC DATA (LABS, IMAGING, TESTING) - I reviewed patient records, labs, notes, testing and imaging myself where available.  Lab Results  Component Value Date   WBC 6.7 06/30/2015   HGB 8.7* 06/30/2015   HCT 26.8* 06/30/2015   MCV 95.7 06/30/2015   PLT 158  06/30/2015      Component Value Date/Time   NA 141 09/20/2015 1130   NA 136 06/29/2015 0549   K 3.6 09/20/2015 1130   CL 102 09/20/2015 1130   CO2 29 09/20/2015 1130   GLUCOSE 117* 09/20/2015 1130   GLUCOSE 198* 06/29/2015 0549   BUN 13 09/20/2015 1130   BUN 14 06/29/2015 0549   CREATININE 0.47* 09/20/2015 1130   CREATININE 0.62 12/31/2012 1210   CALCIUM 9.9 09/20/2015 1130   PROT 7.3 09/20/2015 1130   PROT 5.8* 06/28/2015 0915   ALBUMIN 3.7 09/20/2015 1130   ALBUMIN 2.3* 06/28/2015 0915   AST 12 09/20/2015 1130   ALT 9 09/20/2015 1130   ALKPHOS 78 09/20/2015 1130   BILITOT 0.3 09/20/2015 1130   BILITOT 0.5 06/28/2015 0915   GFRNONAA 96 09/20/2015 1130   GFRNONAA >89 12/31/2012 1210   GFRAA 111 09/20/2015 1130   GFRAA >89 12/31/2012 1210   Lab Results  Component Value Date   CHOL 137 09/20/2015   HDL 45 09/20/2015   LDLCALC 63 09/20/2015   TRIG 147 09/20/2015   CHOLHDL 3.0 09/20/2015   Lab Results  Component Value Date   HGBA1C 6.2 09/20/2015   No results found for: [id number] Lab Results  Component Value Date   TSH 1.391 09/13/2014   09/14/14 TTE  - Left ventricle: The cavity size was normal. Wall thickness was increased in a pattern of mild LVH. The estimated ejection fraction was 60%. Wall motion was normal; there were no regional wall motion abnormalities. - Aortic valve: The valve appears to be grossly normal. There was mild regurgitation. - Mitral valve: There was mild regurgitation. - Left atrium: The atrium was mildly dilated. - Pulmonary arteries: PA peak pressure: 42 mm Hg (S). - Impressions: No cardiac source of embolism was identified, but cannot be ruled out on the basis of this examination.  11/08/15 MRI thoracic spine (without) [I reviewed images myself and agree with interpretation. -VRP]  1. The spinal cord is notable for abnormal T2 hyperintense signal from C7 down to T11 levels. May be consistent with chronic neurosarcoidosis. 2.  Multi-level degenerative changes: Rotoscoliosis convex to the right centered at T10. Degenerative spondylosis and disc bulging from C6-T1 and T8 to T11. Degenerative endplate disease and marrow edema at C7-T1, T9-10 and T10-11.  3. Moderate left foraminal stenosis at T8-9, T9-10, T10-11.  4. Compared to  MRI on 11/15/07, there has been increase in degenerative changes and increase in T2 hyperintense spinal cord signal.   11/08/15 MRI lumbar spine (without) [I reviewed images myself and agree with interpretation. -VRP]  1. Multi-level degenerative changes: Chronic compression fracture of L1 (30% loss of height). Scoliosis convex left centered at L3-4. Degenerative spondylosis and disc bulging at T12-L1 to L4-5. Decreased disc space height at T10-11 and L3-4. 2. At L3-4: disc bulging and facet hypertrophy with severe right and mild left foraminal stenosis; right lateral recess stenosis 3. At L4-5: disc bulging and facet hypertrophy; asymmetric left facet arthropathy with mild right foraminal stenosis; left lateral recess stenosis 4. At L2-3: disc bulging and facet hypertrophy with moderate right and mild left foraminal stenosis  5. At L1-2: disc bulging and facet hypertrophy with mild biforaminal stenosis  6. At T12-L1: disc bulging and facet hypertrophy with mild left foraminal stenosis  7. Compared to MRI on 11/15/07 there has been progression of degenerative changes.      ASSESSMENT AND PLAN  75 y.o. year old female here with history of neurosarcoidosis, pulmonary sarcoidosis, diabetes, with progressive gait and balance difficulty since 2001, with more severe problems since 2014 and especially November 2016. Likely represents regression of neurosarcoidosis which previously suspected cervical and thoracic spinal cord from prior imaging. Other contributed factors include deconditioning, diabetic neuropathy and depression.   Dx:  1. Neurosarcoidosis ([place])   2. Gait difficulty   3. Severe  muscle deconditioning   4. Chronic pain syndrome   5. Hopelessness      PLAN: - offered palliative care consult, pain mgmt referral, higher prednisone, other immunosuppresion, add'l testing/treatments; patient kindly declined  Return if symptoms worsen or fail to improve, for return to PCP.   Penni Bombard, MD [id number], [id number] PM Certified in Neurology, Neurophysiology and Neuroimaging  [facility] [address], [place], [place] [address] [phone]

## 2015-12-08 ENCOUNTER — Telehealth: Payer: Self-pay | Admitting: Nurse Practitioner

## 2015-12-08 NOTE — Telephone Encounter (Signed)
Pt given appt with MMM 5/15 at 4:30.

## 2015-12-13 ENCOUNTER — Telehealth: Payer: Self-pay | Admitting: Nurse Practitioner

## 2015-12-13 MED ORDER — HYDROCODONE-ACETAMINOPHEN 10-325 MG PO TABS: 1.0000 | ORAL_TABLET | Freq: Two times a day (BID) | ORAL | Status: DC | PRN

## 2015-12-13 NOTE — Telephone Encounter (Signed)
Refilled, covering for PCP.   Pt is well known to our clinic, it was explained to patient for refills, especially early refills, she would need appointment in the future.   Elizabeth Apple, MD [facility] 12/13/2015, 6:59 PM

## 2015-12-14 ENCOUNTER — Telehealth: Payer: Self-pay

## 2015-12-14 NOTE — Telephone Encounter (Signed)
Pharmacy called questioning hydrocodone rx being filled a week early. Wendi Snipes wrote for her yesterday because of your absence and knew it was early. Advised patient that she would need to come in for future refills. [facility] states that she also got the rx early last month. They advised that dose may need to be changed so rx can get back on track. FYI

## 2015-12-19 ENCOUNTER — Ambulatory Visit (INDEPENDENT_AMBULATORY_CARE_PROVIDER_SITE_OTHER): Payer: Medicare Other | Admitting: Nurse Practitioner

## 2015-12-19 ENCOUNTER — Encounter: Payer: Self-pay | Admitting: Nurse Practitioner

## 2015-12-19 VITALS — BP 115/59 | HR 100 | Temp 97.7°F

## 2015-12-19 DIAGNOSIS — I4891 Unspecified atrial fibrillation: Secondary | ICD-10-CM | POA: Diagnosis not present

## 2015-12-19 DIAGNOSIS — R5381 Other malaise: Secondary | ICD-10-CM | POA: Diagnosis not present

## 2015-12-19 DIAGNOSIS — Z993 Dependence on wheelchair: Secondary | ICD-10-CM

## 2015-12-19 DIAGNOSIS — I10 Essential (primary) hypertension: Secondary | ICD-10-CM | POA: Diagnosis not present

## 2015-12-19 DIAGNOSIS — M810 Age-related osteoporosis without current pathological fracture: Secondary | ICD-10-CM

## 2015-12-19 DIAGNOSIS — E118 Type 2 diabetes mellitus with unspecified complications: Secondary | ICD-10-CM | POA: Diagnosis not present

## 2015-12-19 DIAGNOSIS — N3281 Overactive bladder: Secondary | ICD-10-CM

## 2015-12-19 DIAGNOSIS — S72002A Fracture of unspecified part of neck of left femur, initial encounter for closed fracture: Secondary | ICD-10-CM | POA: Diagnosis not present

## 2015-12-19 DIAGNOSIS — G894 Chronic pain syndrome: Secondary | ICD-10-CM | POA: Diagnosis not present

## 2015-12-19 DIAGNOSIS — Z86711 Personal history of pulmonary embolism: Secondary | ICD-10-CM

## 2015-12-19 DIAGNOSIS — E785 Hyperlipidemia, unspecified: Secondary | ICD-10-CM

## 2015-12-19 LAB — BAYER DCA HB A1C WAIVED: HB A1C: 6.9 % (ref <7.0)

## 2015-12-19 MED ORDER — HYDROCODONE-ACETAMINOPHEN 10-325 MG PO TABS: 1.0000 | ORAL_TABLET | Freq: Two times a day (BID) | ORAL | Status: DC | PRN

## 2015-12-19 NOTE — Patient Instructions (Signed)
Deconditioning Deconditioning refers to the changes in your body that occur during a period of inactivity. Deconditioning results in changes to your heart, lungs, and muscles. These changes decrease your ability to endure activity, resulting in feelings of fatigue and weakness. Deconditioning can occur after only a few days of bed rest or inactivity. The longer the period of inactivity, the more severe your symptoms of deconditioning will be. After longer periods of inactivity, it will also take longer for you to return to your previous level of functioning. Deconditioning can be mild, moderate, or severe:  Mild. Your condition interferes with your ability to perform your usual types of exercise, such as running, biking, or swimming.  Moderate. Your condition interferes with your ability to do normal everyday activities. This may include walking, grocery shopping, or doing chores or lawn work.  Severe. Your condition interferes with your ability to perform minimal activity or normal self-care. CAUSES  Some common reasons for inactivity that may result in deconditioning include:  Illnesses, such as cancer, stroke, heart attack, fibromyalgia, or chronic fatigue syndrome.  Injuries, especially back injuries, broken bones, or injury to ligaments or tendons.  Surgery or a long stay in the hospital for any reason.  Pregnancy, especially with conditions that require long periods of bed rest. RISK FACTORS Anything that results in a period of hospitalization or bed rest will put you at risk of deconditioning. Some other factors that can increase the risk include:  Obesity.  Poor nutrition.  Old age.  Injuries or illnesses that interfere with movement and activity. SIGNS AND SYMPTOMS  Feeling weak.  Feeling tired.  Shortness of breath with minor exertion.  Your heart beating faster than normal. You may or may not notice this without taking your pulse.  Pain or discomfort with  activity.  Decreased strength.  Decreased sense of balance.  Decreased endurance.  Difficulty participating in usual forms of exercise.  Difficulty doing activities of daily living, such as grocery shopping or chores.  Difficulty walking around the house and doing basic self-care, such as getting to the bathroom, preparing meals, or doing laundry. DIAGNOSIS  There is no specific test to diagnose deconditioning. Your health care provider will take your medical history and do a physical exam. During the physical exam, the health care provider will check for signs of deconditioning, such as:  Decreased size of muscles.  Decreased strength.  Difficulty with balance.  Shortness of breath or abnormally increased heart rate after minor exertion. TREATMENT  Treatment usually involves a structured exercise program in which activity is increased gradually. Your health care provider will determine which exercises are right for you. The exercise program will likely include aerobic exercise and strength training. Aerobic exercise helps improve the functioning of the heart and lungs as well as the muscles. Strength training helps improve muscle size and strength. Both of these types of exercise will improve your endurance. You may be referred to a physical therapist who can create a safe strengthening program for you to follow. HOME CARE INSTRUCTIONS  Follow the exercise program recommended by your health care provider or physical therapist.  Do not increase your exercise any faster than directed.  Eat a healthy diet.  If your health care provider thinks that you need to lose weight, consider seeing a dietitian to help you do so in a healthy way.  Do not use any tobacco products, including cigarettes, chewing tobacco, or electronic cigarettes. If you need help quitting, ask your health care provider.  Take  medicines only as directed by your health care provider.  Keep all follow-up visits as  directed by your health care provider. This is important. SEEK MEDICAL CARE IF:  You are not able to carry out the prescribed exercise program.  You are not able to carry out your usual level of activity.  You are having trouble doing normal household chores or caring for yourself.  You are becoming increasingly fatigued and weak.  You become light-headed when rising to a sitting or standing position.  Your level of endurance decreases after having improved. SEEK IMMEDIATE MEDICAL CARE IF:  You have chest pain.  You are very short of breath.  You have any episodes of passing out.   This information is not intended to replace advice given to you by your health care provider. Make sure you discuss any questions you have with your health care provider.   Document Released: 12/07/2013 Document Reviewed: 12/07/2013 Elsevier Interactive Patient Education [employer].

## 2015-12-19 NOTE — Addendum Note (Signed)
Addended by: Chevis Pretty on: 12/19/2015 04:55 PM   Modules accepted: Orders

## 2015-12-19 NOTE — Progress Notes (Signed)
Subjective:    Patient ID: Elizabeth Drake, female    DOB: 08/28/1940, 75 y.o.   MRN: [id number]  HPI   Patient here today for follow up of chronic medical problems.  Outpatient Encounter Prescriptions as of 12/19/2015  Medication Sig  . acetaminophen (TYLENOL) 500 MG tablet Take 2 tablets (1,000 mg total) by mouth every 8 (eight) hours.  . calcium-vitamin D (OSCAL 500/200 D-3) 500-200 MG-UNIT per tablet Take 1 tablet by mouth daily with breakfast.  . feeding supplement (BOOST / RESOURCE BREEZE) LIQD Take 1 Container by mouth 2 (two) times daily between meals.  Marland Kitchen glimepiride (AMARYL) 2 MG tablet Take 1 tablet (2 mg total) by mouth daily.  Marland Kitchen HYDROcodone-acetaminophen (NORCO) 10-325 MG tablet Take 1 tablet by mouth 2 (two) times daily as needed.  . metFORMIN (GLUCOPHAGE) 500 MG tablet TAKE 1 TABLET EVERY DAY  . metoprolol tartrate (LOPRESSOR) 25 MG tablet Take 1 tablet (25 mg total) by mouth 2 (two) times daily.  . mometasone (NASONEX) 50 MCG/ACT nasal spray 2 sprays per nostril qd (Patient taking differently: Place 2 sprays into the nose daily as needed (allergies). 2 sprays per nostril qd)  . polyethylene glycol (MIRALAX / GLYCOLAX) packet Take 17 g by mouth daily as needed.  . predniSONE (DELTASONE) 5 MG tablet Take 1 tablet (5 mg total) by mouth daily with breakfast.  . rivaroxaban (XARELTO) 20 MG TABS tablet TAKE ONE TABLET BY MOUTH ONE TIME DAILY WITH SUPPER  . senna (SENOKOT) 8.6 MG TABS tablet Take 2 tablets (17.2 mg total) by mouth at bedtime.   No facility-administered encounter medications on file as of 12/19/2015.    * Patient fell and fractured her hip rigt before thanksgiving ast year and fracture left hip-Had to have surgery then went to rehab at [facility]. Was discharged in February. She actaully signed herself out because they were not helping her. She says that sh eis not able to do anything on her own- she is not able to walk at all and just stays in wheel chair or chair or  bed all day long.deneis any developing bedsores.  Hypertension This is a chronic problem. The current episode started more than 1 year ago. The problem is unchanged. The problem is controlled. Pertinent negatives include no chest pain, headaches, neck pain, palpitations, peripheral edema or shortness of breath. There are no associated agents to hypertension. Risk factors for coronary artery disease include dyslipidemia, family history, post-menopausal state and sedentary lifestyle. Past treatments include beta blockers. The current treatment provides moderate improvement. Hypertensive end-organ damage includes CAD/MI.  Hyperlipidemia This is a chronic problem. The current episode started more than 1 year ago. The problem is controlled. Recent lipid tests were reviewed and are normal. Exacerbating diseases include diabetes. She has no history of hypothyroidism or obesity. Pertinent negatives include no chest pain or shortness of breath. Current antihyperlipidemic treatment includes diet change. The current treatment provides moderate improvement of lipids. Compliance problems include adherence to exercise.  Risk factors for coronary artery disease include post-menopausal, hypertension, diabetes mellitus, dyslipidemia and family history.  Diabetes She presents for her follow-up diabetic visit. She has type 2 diabetes mellitus. No MedicAlert identification noted. Her disease course has been stable. Pertinent negatives for hypoglycemia include no headaches. Pertinent negatives for diabetes include no chest pain, no polydipsia, no polyphagia, no polyuria, no weakness and no weight loss. Symptoms are stable. Risk factors for coronary artery disease include diabetes mellitus, dyslipidemia, family history, hypertension, post-menopausal and sedentary lifestyle. Current  diabetic treatment includes oral agent (dual therapy). She is compliant with treatment most of the time. Her weight is stable. She is following a low  salt and diabetic diet. When asked about meal planning, she reported none. She has not had a previous visit with a dietician. She rarely participates in exercise. Her breakfast blood glucose is taken between 7-8 am. Her breakfast blood glucose range is generally 90-110 mg/dl. Her overall blood glucose range is 90-110 mg/dl. An ACE inhibitor/angiotensin II receptor blocker is not being taken. She sees a podiatrist.Eye exam is current.  Overactive bladder Has occasions when has trouble voiding-on no meds currently Atrial fib/ hx pulmonary emboli Currently on metoprolol and xeralto- no bleeding Sarcoidosis On prednisone chronic- no recent flare ups Osteoarthritis/wheel chair stricken Has a lot of stiffness but prednisone helps that and pain meds help with pain Physical deconditioning Has actually lost weight since hip fracture Chronic pain Meds On chronic pain meds due to back pain and hip pain- she takes norco 10/325 2 times a day- but says that she does not take it BID every day.  Review of Systems  Constitutional: Negative.   HENT: Negative.   Respiratory: Negative.   Cardiovascular: Negative.   Gastrointestinal: Negative.   Genitourinary: Negative.   Neurological: Negative.   Psychiatric/Behavioral: Negative.   All other systems reviewed and are negative.      Objective:   Physical Exam  Constitutional: She is oriented to person, place, and time. She appears well-developed and well-nourished.  HENT:  Right Ear: External ear normal.  Left Ear: External ear normal.  Nose: Nose normal.  Mouth/Throat: Oropharynx is clear and moist.  Cardiovascular: Normal rate, regular rhythm and normal heart sounds.   Pulmonary/Chest: Effort normal and breath sounds normal.  Abdominal: Soft. Bowel sounds are normal.  Musculoskeletal:  Wheel chair confined- and lower extremities are very weak against resistance.  Neurological: She is alert and oriented to person, place, and time.  Skin: Skin is  warm.  multiple scrapes and cuts bil lower ext  Psychiatric: She has a normal mood and affect. Her behavior is normal. Judgment and thought content normal.   BP 115/59 mmHg  Pulse 100  Temp(Src) 97.7 F (36.5 C) (Oral)  Ht   Wt       Assessment & Plan:   1. Type 2 diabetes mellitus with complication, without long-term current use of insulin ([place])   2. Essential hypertension   3. Hyperlipidemia   4. Atrial fibrillation with RVR ([place])   5. Osteoporosis   6. Closed left hip fracture, initial encounter ([place])   7. OAB (overactive bladder)   8. Wheelchair bound   9. History of pulmonary embolism   10. Physical deconditioning   11. Chronic pain syndrome (LEGS/BACK)    Orders Placed This Encounter  Procedures  . Bayer DCA Hb A1c Waived  . CMP14+EGFR  . Lipid panel   No meds needed refilling today COntinue all meds Labs pending Encouraged o eat 3 meals and 2 snacks a day Fall precautions RTO in 3 months  [place], FNP

## 2015-12-20 LAB — CMP14+EGFR
ALK PHOS: 73 IU/L (ref 39–117)
ALT: 5 IU/L (ref 0–32)
AST: 12 IU/L (ref 0–40)
Albumin/Globulin Ratio: 0.9 — ABNORMAL LOW (ref 1.2–2.2)
Albumin: 3.4 g/dL — ABNORMAL LOW (ref 3.5–4.8)
BILIRUBIN TOTAL: 0.4 mg/dL (ref 0.0–1.2)
BUN/Creatinine Ratio: 21 (ref 12–28)
BUN: 13 mg/dL (ref 8–27)
CO2: 25 mmol/L (ref 18–29)
Calcium: 10.1 mg/dL (ref 8.7–10.3)
Chloride: 103 mmol/L (ref 96–106)
Creatinine, Ser: 0.62 mg/dL (ref 0.57–1.00)
GFR calc Af Amer: 101 mL/min/{1.73_m2} (ref >59)
GFR calc non Af Amer: 88 mL/min/{1.73_m2} (ref >59)
GLUCOSE: 201 mg/dL — AB (ref 65–99)
Globulin, Total: 3.8 g/dL (ref 1.5–4.5)
Potassium: 3.7 mmol/L (ref 3.5–5.2)
Sodium: 143 mmol/L (ref 134–144)
Total Protein: 7.2 g/dL (ref 6.0–8.5)

## 2015-12-20 LAB — LIPID PANEL
Chol/HDL Ratio: 3.7 ratio units (ref 0.0–4.4)
Cholesterol, Total: 161 mg/dL (ref 100–199)
HDL: 43 mg/dL (ref >39)
LDL Calculated: 79 mg/dL (ref 0–99)
Triglycerides: 196 mg/dL — ABNORMAL HIGH (ref 0–149)
VLDL Cholesterol Cal: 39 mg/dL (ref 5–40)

## 2016-01-12 DIAGNOSIS — E1151 Type 2 diabetes mellitus with diabetic peripheral angiopathy without gangrene: Secondary | ICD-10-CM | POA: Diagnosis not present

## 2016-01-12 DIAGNOSIS — L84 Corns and callosities: Secondary | ICD-10-CM | POA: Diagnosis not present

## 2016-01-12 DIAGNOSIS — B351 Tinea unguium: Secondary | ICD-10-CM | POA: Diagnosis not present

## 2016-01-16 ENCOUNTER — Ambulatory Visit (INDEPENDENT_AMBULATORY_CARE_PROVIDER_SITE_OTHER): Payer: Medicare Other | Admitting: Family Medicine

## 2016-01-16 ENCOUNTER — Encounter: Payer: Self-pay | Admitting: Family Medicine

## 2016-01-16 VITALS — BP 123/63 | HR 65 | Temp 98.3°F | Ht 66.0 in | Wt 119.0 lb

## 2016-01-16 DIAGNOSIS — L723 Sebaceous cyst: Secondary | ICD-10-CM | POA: Diagnosis not present

## 2016-01-16 MED ORDER — DOXYCYCLINE HYCLATE 100 MG PO TABS: 100.0000 mg | ORAL_TABLET | Freq: Two times a day (BID) | ORAL | Status: DC

## 2016-01-16 NOTE — Progress Notes (Signed)
Subjective:    Patient ID: Elizabeth Drake, female    DOB: 1941-04-10, 75 y.o.   MRN: [id number]  HPI Patient here today for a sore that she noticed yesterday in the back her head. She is accompanied today by her husband. She does not recall cyst or lump being in this location previously.    Patient Active Problem List   Diagnosis Date Noted  . Protein-calorie malnutrition, severe 06/28/2015  . Pressure ulcer 06/28/2015  . Closed left hip fracture ([place]) 06/25/2015  . Atrial fibrillation with RVR ([place]) 09/13/2014  . Hypertension 10/12/2013  . Physical deconditioning 06/30/2013  . History of pulmonary embolism 06/24/2013  . DM type 2 (diabetes mellitus, type 2) ([place]) 04/25/2013  . OAB (overactive bladder) 04/25/2013  . Wheelchair bound 04/25/2013  . Osteoporosis 04/25/2013  . Chronic pain syndrome (LEGS/BACK) 04/25/2013  . OA (osteoarthritis) 04/25/2013  . Hyperlipidemia 12/31/2012  . 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 12/31/2012   Outpatient Encounter Prescriptions as of 01/16/2016  Medication Sig  . acetaminophen (TYLENOL) 500 MG tablet Take 2 tablets (1,000 mg total) by mouth every 8 (eight) hours.  . calcium-vitamin D (OSCAL 500/200 D-3) 500-200 MG-UNIT per tablet Take 1 tablet by mouth daily with breakfast.  . feeding supplement (BOOST / RESOURCE BREEZE) LIQD Take 1 Container by mouth 2 (two) times daily between meals.  Marland Kitchen glimepiride (AMARYL) 2 MG tablet Take 1 tablet (2 mg total) by mouth daily.  Marland Kitchen HYDROcodone-acetaminophen (NORCO) 10-325 MG tablet Take 1 tablet by mouth 2 (two) times daily as needed.  . metFORMIN (GLUCOPHAGE) 500 MG tablet TAKE 1 TABLET EVERY DAY  . metoprolol tartrate (LOPRESSOR) 25 MG tablet Take 1 tablet (25 mg total) by mouth 2 (two) times daily.  . mometasone (NASONEX) 50 MCG/ACT nasal spray 2 sprays per nostril qd (Patient taking differently: Place 2 sprays into the nose daily as needed (allergies). 2 sprays per nostril qd)  . predniSONE (DELTASONE) 5 MG  tablet Take 1 tablet (5 mg total) by mouth daily with breakfast.  . rivaroxaban (XARELTO) 20 MG TABS tablet TAKE ONE TABLET BY MOUTH ONE TIME DAILY WITH SUPPER  . senna (SENOKOT) 8.6 MG TABS tablet Take 2 tablets (17.2 mg total) by mouth at bedtime.  . polyethylene glycol (MIRALAX / GLYCOLAX) packet Take 17 g by mouth daily as needed. (Patient not taking: Reported on 01/16/2016)   No facility-administered encounter medications on file as of 01/16/2016.      Review of Systems  Constitutional: Negative.   HENT: Negative.   Eyes: Negative.   Respiratory: Negative.   Cardiovascular: Negative.   Gastrointestinal: Negative.   Endocrine: Negative.   Genitourinary: Negative.   Musculoskeletal: Negative.   Skin: Negative.        Sore  -back of head Sore - right lower arm  Allergic/Immunologic: Negative.   Neurological: Negative.   Hematological: Negative.   Psychiatric/Behavioral: Negative.        Objective:   Physical Exam  Constitutional: She appears well-developed and well-nourished.  Skin:  Area of concern appears to be a mildly infected sebaceous cyst. There is no drainage area and it is tender and inflamed.   BP 123/63 mmHg  Pulse 65  Temp(Src) 98.3 F (36.8 C) (Oral)  Ht 5\' 6"  (1.676 m)  Wt 119 lb (53.978 kg)  BMI 19.22 kg/m2        Assessment & Plan:  1. Inflamed sebaceous cyst Patient is allergic to Keflex so we'll use doxycycline 100 mg twice a day for  1 week. Also recommend warm compresses to the area 3-4 times a day. Call or return if symptoms do not improve  Wardell Honour MD

## 2016-02-09 ENCOUNTER — Other Ambulatory Visit: Payer: Self-pay | Admitting: Nurse Practitioner

## 2016-02-09 MED ORDER — HYDROCODONE-ACETAMINOPHEN 10-325 MG PO TABS: 1.0000 | ORAL_TABLET | Freq: Two times a day (BID) | ORAL | Status: DC | PRN

## 2016-02-09 NOTE — Telephone Encounter (Signed)
Last filled 01/12/16. Last seen 12/19/15

## 2016-02-09 NOTE — Telephone Encounter (Signed)
rx ready for pickup 

## 2016-02-10 NOTE — Telephone Encounter (Signed)
Patient informed that prescription ready and placed at front for pick up

## 2016-03-02 ENCOUNTER — Other Ambulatory Visit: Payer: Self-pay | Admitting: Nurse Practitioner

## 2016-03-05 NOTE — Telephone Encounter (Signed)
To early

## 2016-03-05 NOTE — Telephone Encounter (Signed)
Last filled 02/09/16, Too early?

## 2016-03-05 NOTE — Telephone Encounter (Signed)
Patient aware.

## 2016-03-12 ENCOUNTER — Other Ambulatory Visit: Payer: Self-pay | Admitting: Nurse Practitioner

## 2016-03-12 ENCOUNTER — Telehealth: Payer: Self-pay | Admitting: Nurse Practitioner

## 2016-03-12 MED ORDER — HYDROCODONE-ACETAMINOPHEN 10-325 MG PO TABS: 1.0000 | ORAL_TABLET | Freq: Two times a day (BID) | ORAL | 0 refills | Status: DC | PRN

## 2016-03-12 NOTE — Telephone Encounter (Signed)
Office policy that if going to stay on pain meds- needs appointment for pain management review Rx ready for pick up

## 2016-03-12 NOTE — Telephone Encounter (Signed)
Pt aware.

## 2016-03-20 DIAGNOSIS — E1151 Type 2 diabetes mellitus with diabetic peripheral angiopathy without gangrene: Secondary | ICD-10-CM | POA: Diagnosis not present

## 2016-03-20 DIAGNOSIS — L84 Corns and callosities: Secondary | ICD-10-CM | POA: Diagnosis not present

## 2016-03-20 DIAGNOSIS — B351 Tinea unguium: Secondary | ICD-10-CM | POA: Diagnosis not present

## 2016-03-22 ENCOUNTER — Telehealth: Payer: Self-pay | Admitting: Nurse Practitioner

## 2016-03-22 NOTE — Telephone Encounter (Signed)
She just got rx fro #[id number] on August 7,2017

## 2016-03-23 NOTE — Telephone Encounter (Signed)
Called and informed patient of refill on 08/07 of 60 tablets.  Patient states that she thinks she has more somewhere and will call back if needed

## 2016-03-27 ENCOUNTER — Other Ambulatory Visit: Payer: Self-pay | Admitting: *Deleted

## 2016-03-27 NOTE — Patient Outreach (Signed)
[facility]) Care Management  03/27/2016  Elizabeth Drake [birth year] [id number]  Referral via EMMI-Prevent:  Telephone call to patient who was advised of reason for call &  of [facility] care management services. [place] verification received from patient.   Patient agreed to screening assessment. After completion of screening,  RN CM gave recommendation that [facility] services may be helpful.   Patient states she has no major health concerns. States she does own personal care & gets assistance from spouse if needed. States no problems getting prescriptions filled and is taking medications as prescribed by her doctors. States attending primary care & specialists  appointments as scheduled.   Patient has declined [facility] care management services. She consents to receiving brochure with contact information for [facility] services.    Plan: Send educational letter with [facility] contact information to patient. Send MD closure letter Close case.  Sherrin Daisy, RN BSN [place] Management Coordinator [facility] Care Management  [phone]

## 2016-03-28 ENCOUNTER — Encounter: Payer: Self-pay | Admitting: *Deleted

## 2016-04-06 ENCOUNTER — Other Ambulatory Visit: Payer: Self-pay

## 2016-04-06 DIAGNOSIS — E118 Type 2 diabetes mellitus with unspecified complications: Secondary | ICD-10-CM

## 2016-04-06 DIAGNOSIS — I4891 Unspecified atrial fibrillation: Secondary | ICD-10-CM

## 2016-04-06 MED ORDER — GLIMEPIRIDE 2 MG PO TABS: 2.0000 mg | ORAL_TABLET | Freq: Every day | ORAL | 0 refills | Status: DC

## 2016-04-06 MED ORDER — RIVAROXABAN 20 MG PO TABS: ORAL_TABLET | ORAL | 0 refills | Status: DC

## 2016-04-06 MED ORDER — METFORMIN HCL 500 MG PO TABS: 500.0000 mg | ORAL_TABLET | Freq: Every day | ORAL | 0 refills | Status: DC

## 2016-04-30 ENCOUNTER — Other Ambulatory Visit: Payer: Self-pay | Admitting: Nurse Practitioner

## 2016-04-30 ENCOUNTER — Telehealth: Payer: Self-pay | Admitting: Nurse Practitioner

## 2016-04-30 MED ORDER — HYDROCODONE-ACETAMINOPHEN 10-325 MG PO TABS: 1.0000 | ORAL_TABLET | Freq: Two times a day (BID) | ORAL | 0 refills | Status: DC | PRN

## 2016-04-30 NOTE — Telephone Encounter (Signed)
Patient needs to be sen for pain contract if she is going to continue to get pain medication refills. Hydrocodone rx ready for pick up

## 2016-04-30 NOTE — Telephone Encounter (Signed)
Patient aware.

## 2016-05-04 ENCOUNTER — Ambulatory Visit: Payer: Medicare Other | Admitting: Nurse Practitioner

## 2016-05-18 ENCOUNTER — Other Ambulatory Visit: Payer: Self-pay | Admitting: Nurse Practitioner

## 2016-05-18 DIAGNOSIS — I4891 Unspecified atrial fibrillation: Secondary | ICD-10-CM

## 2016-05-25 IMAGING — RF DG FEMUR 2+V*L*
1 series · 4 of 4 positions shown · non-contrast
Comparison: Preoperative CT images [id number]

CLINICAL DATA: 74-year-old female undergoing ORIF of a left
intertrochanteric femoral fracture

EXAM:
DG C-ARM 61-120 MIN; LEFT FEMUR 2 VIEWS

[Series 1: run · 4 of 4 slices shown]
[im 1/4]
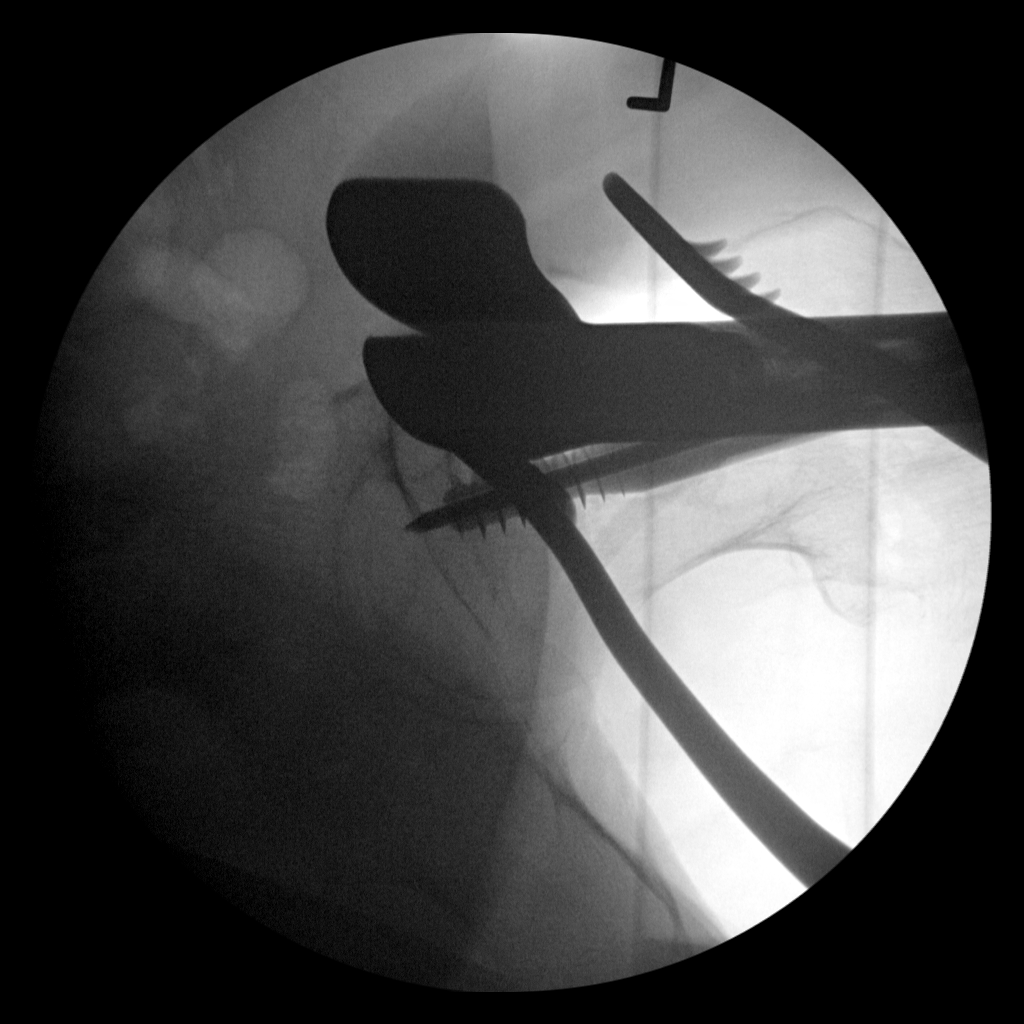
[im 2/4]
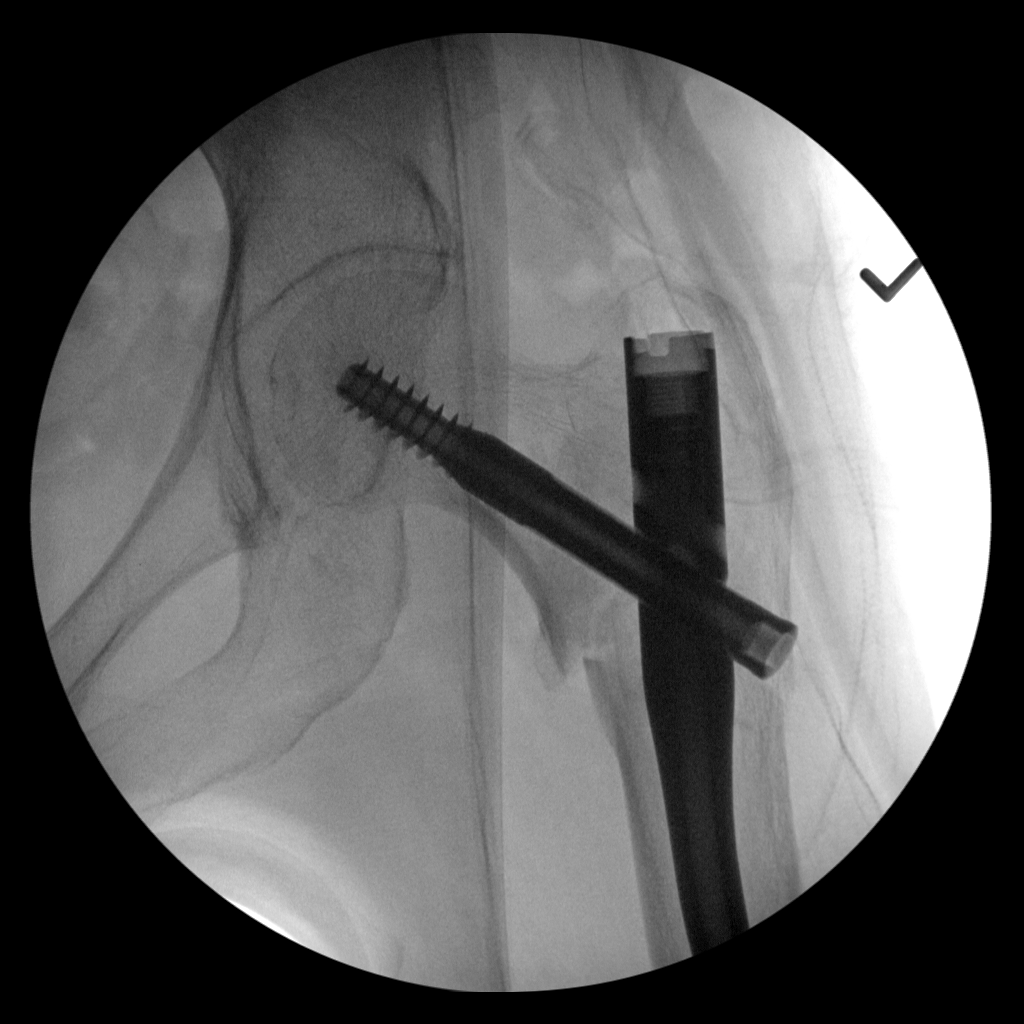
[im 3/4]
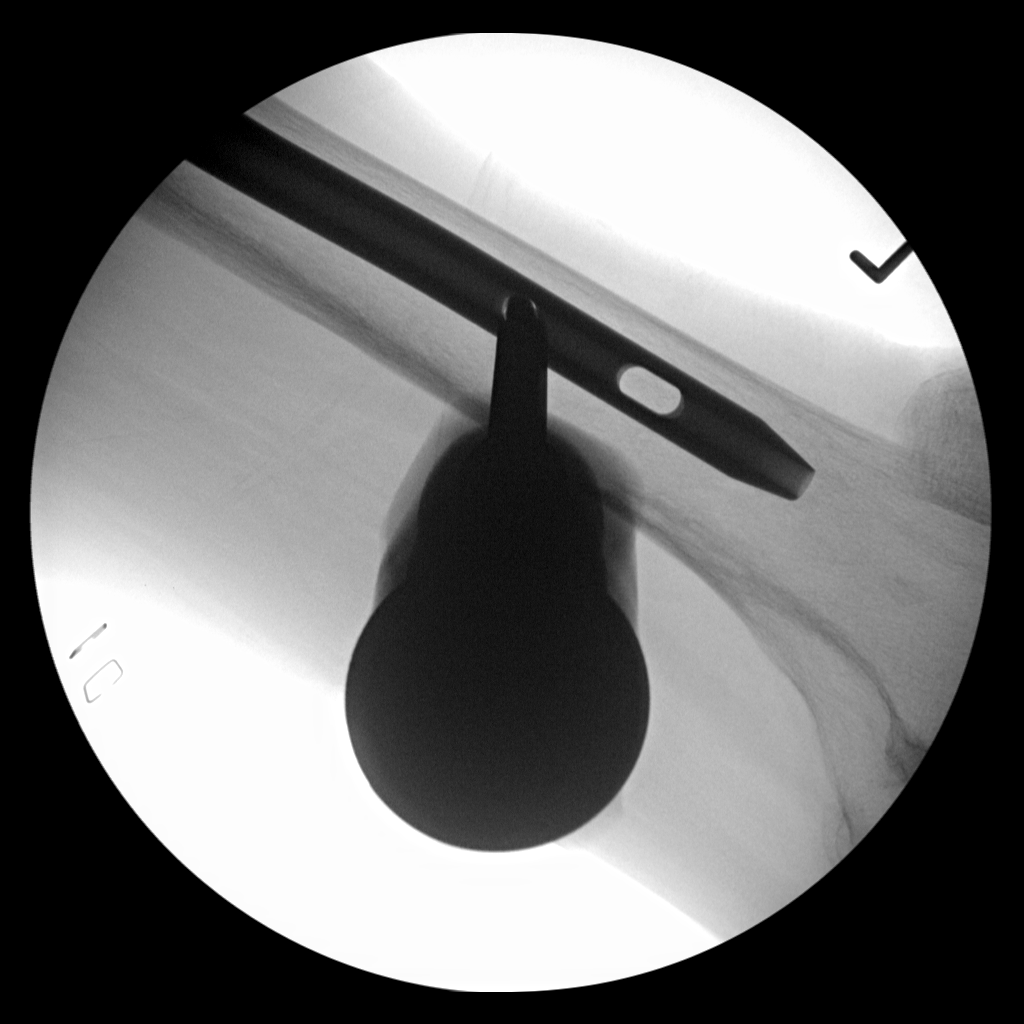
[im 4/4]
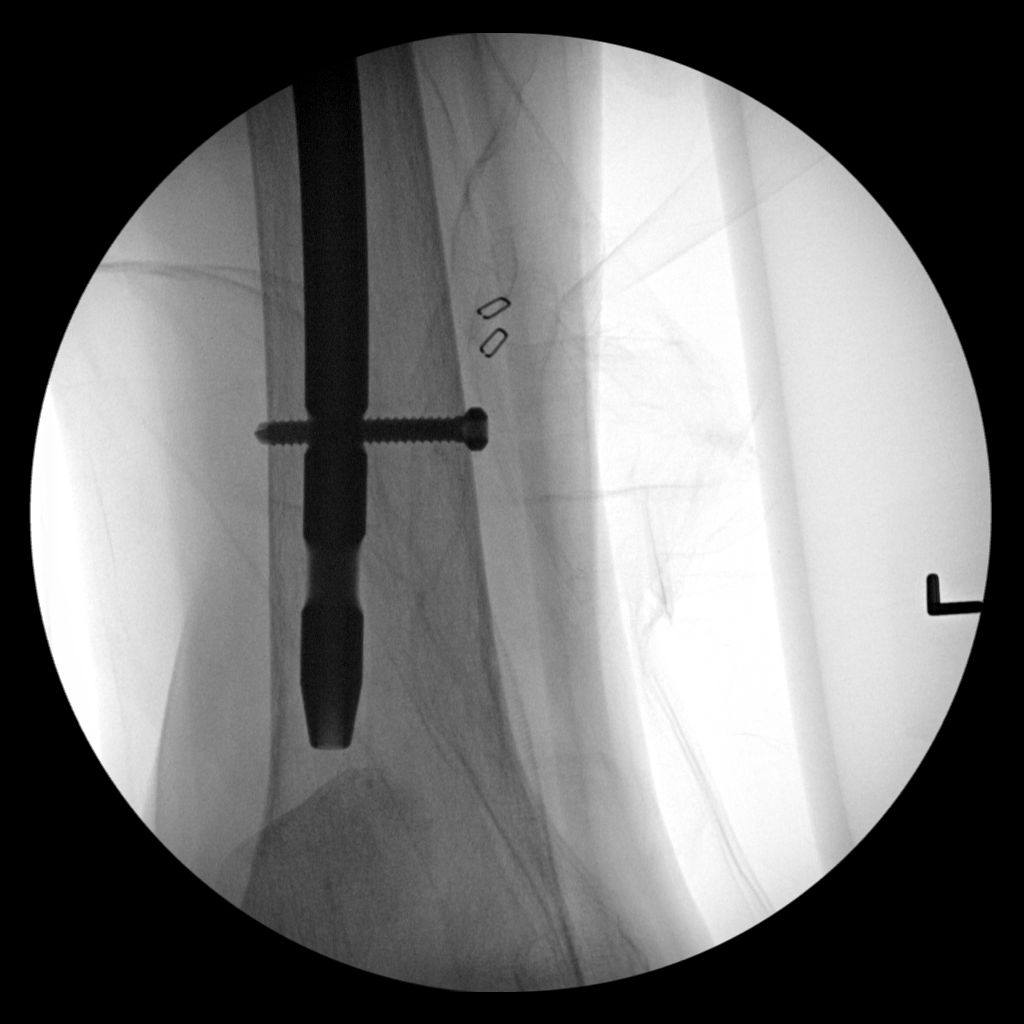

[4 of 4 positions shown; findings below may reference images not displayed]

FINDINGS: A total of 4 intraoperative spot radiographs demonstrate fixation of
the intertrochanteric fracture with an intra medullary nail
including a single distal interlocking screw and a transfemoral neck
cannulated lag screw. Alignment of the fracture fragments appears
anatomic. No evidence of immediate hardware complication.
IMPRESSION: ORIF of intertrochanteric left femoral fracture as described above.

## 2016-05-29 ENCOUNTER — Ambulatory Visit (INDEPENDENT_AMBULATORY_CARE_PROVIDER_SITE_OTHER): Payer: Medicare Other

## 2016-05-29 DIAGNOSIS — Z23 Encounter for immunization: Secondary | ICD-10-CM

## 2016-06-05 ENCOUNTER — Encounter: Payer: Self-pay | Admitting: Nurse Practitioner

## 2016-06-05 ENCOUNTER — Ambulatory Visit (INDEPENDENT_AMBULATORY_CARE_PROVIDER_SITE_OTHER): Payer: Medicare Other | Admitting: Nurse Practitioner

## 2016-06-05 VITALS — BP 136/70 | HR 60 | Temp 96.9°F

## 2016-06-05 DIAGNOSIS — Z993 Dependence on wheelchair: Secondary | ICD-10-CM

## 2016-06-05 DIAGNOSIS — I10 Essential (primary) hypertension: Secondary | ICD-10-CM

## 2016-06-05 DIAGNOSIS — N3281 Overactive bladder: Secondary | ICD-10-CM

## 2016-06-05 DIAGNOSIS — D869 Sarcoidosis, unspecified: Secondary | ICD-10-CM

## 2016-06-05 DIAGNOSIS — E43 Unspecified severe protein-calorie malnutrition: Secondary | ICD-10-CM

## 2016-06-05 DIAGNOSIS — R5381 Other malaise: Secondary | ICD-10-CM | POA: Diagnosis not present

## 2016-06-05 DIAGNOSIS — E782 Mixed hyperlipidemia: Secondary | ICD-10-CM

## 2016-06-05 DIAGNOSIS — M1990 Unspecified osteoarthritis, unspecified site: Secondary | ICD-10-CM | POA: Diagnosis not present

## 2016-06-05 DIAGNOSIS — E118 Type 2 diabetes mellitus with unspecified complications: Secondary | ICD-10-CM

## 2016-06-05 DIAGNOSIS — I4891 Unspecified atrial fibrillation: Secondary | ICD-10-CM

## 2016-06-05 DIAGNOSIS — G894 Chronic pain syndrome: Secondary | ICD-10-CM

## 2016-06-05 LAB — BAYER DCA HB A1C WAIVED: HB A1C: 7.2 % — AB (ref <7.0)

## 2016-06-05 MED ORDER — HYDROCODONE-ACETAMINOPHEN 10-325 MG PO TABS: 1.0000 | ORAL_TABLET | Freq: Two times a day (BID) | ORAL | 0 refills | Status: DC | PRN

## 2016-06-05 MED ORDER — GLIMEPIRIDE 2 MG PO TABS: 2.0000 mg | ORAL_TABLET | Freq: Every day | ORAL | 1 refills | Status: DC

## 2016-06-05 MED ORDER — METOPROLOL TARTRATE 25 MG PO TABS: 25.0000 mg | ORAL_TABLET | Freq: Two times a day (BID) | ORAL | 1 refills | Status: DC

## 2016-06-05 MED ORDER — METFORMIN HCL 500 MG PO TABS: 500.0000 mg | ORAL_TABLET | Freq: Every day | ORAL | 1 refills | Status: DC

## 2016-06-05 MED ORDER — RIVAROXABAN 20 MG PO TABS: ORAL_TABLET | ORAL | 1 refills | Status: DC

## 2016-06-05 NOTE — Patient Instructions (Signed)

## 2016-06-05 NOTE — Progress Notes (Signed)
Subjective:    Patient ID: Elizabeth Drake, female    DOB: Jun 12, 1941, 75 y.o.   MRN: [id number]  HPI   Patient here today for follow up of chronic medical problems. SHe has no complaint today.  Outpatient Encounter Prescriptions as of 06/05/2016  Medication Sig  . acetaminophen (TYLENOL) 500 MG tablet Take 2 tablets (1,000 mg total) by mouth every 8 (eight) hours.  . calcium-vitamin D (OSCAL 500/200 D-3) 500-200 MG-UNIT per tablet Take 1 tablet by mouth daily with breakfast.  . doxycycline (VIBRA-TABS) 100 MG tablet Take 1 tablet (100 mg total) by mouth 2 (two) times daily.  . feeding supplement (BOOST / RESOURCE BREEZE) LIQD Take 1 Container by mouth 2 (two) times daily between meals.  Marland Kitchen glimepiride (AMARYL) 2 MG tablet Take 1 tablet (2 mg total) by mouth daily.  Marland Kitchen HYDROcodone-acetaminophen (NORCO) 10-325 MG tablet Take 1 tablet by mouth 2 (two) times daily as needed.  . metFORMIN (GLUCOPHAGE) 500 MG tablet Take 1 tablet (500 mg total) by mouth daily.  . metoprolol tartrate (LOPRESSOR) 25 MG tablet Take 1 tablet (25 mg total) by mouth 2 (two) times daily.  . mometasone (NASONEX) 50 MCG/ACT nasal spray 2 sprays per nostril qd (Patient taking differently: Place 2 sprays into the nose daily as needed (allergies). 2 sprays per nostril qd)  . polyethylene glycol (MIRALAX / GLYCOLAX) packet Take 17 g by mouth daily as needed. (Patient not taking: Reported on 01/16/2016)  . predniSONE (DELTASONE) 5 MG tablet Take 1 tablet (5 mg total) by mouth daily with breakfast.  . senna (SENOKOT) 8.6 MG TABS tablet Take 2 tablets (17.2 mg total) by mouth at bedtime.  Alveda Reasons 20 MG TABS tablet TAKE ONE TABLET BY MOUTH ONE TIME DAILY WITH SUPPER   No facility-administered encounter medications on file as of 06/05/2016.    * SINCE PATIENTS HP FRACTURE EARLIER IN THE YEAR - she has been in wheel chair every since.  Hypertension This is a chronic problem. The current episode started more than 1 year ago. The  problem is unchanged. The problem is controlled. Pertinent negatives include no chest pain, headaches, neck pain, palpitations, peripheral edema or shortness of breath. There are no associated agents to hypertension. Risk factors for coronary artery disease include dyslipidemia, family history, post-menopausal state and sedentary lifestyle. Past treatments include beta blockers. The current treatment provides moderate improvement. Hypertensive end-organ damage includes CAD/MI.  Hyperlipidemia This is a chronic problem. The current episode started more than 1 year ago. The problem is controlled. Recent lipid tests were reviewed and are normal. Exacerbating diseases include diabetes. She has no history of hypothyroidism or obesity. Pertinent negatives include no chest pain or shortness of breath. Current antihyperlipidemic treatment includes diet change. The current treatment provides moderate improvement of lipids. Compliance problems include adherence to exercise.  Risk factors for coronary artery disease include post-menopausal, hypertension, diabetes mellitus, dyslipidemia and family history.  Diabetes She presents for her follow-up diabetic visit. She has type 2 diabetes mellitus. No MedicAlert identification noted. Her disease course has been stable. Pertinent negatives for hypoglycemia include no headaches. Pertinent negatives for diabetes include no chest pain, no polydipsia, no polyphagia, no polyuria, no weakness and no weight loss. Symptoms are stable. Risk factors for coronary artery disease include diabetes mellitus, dyslipidemia, family history, hypertension, post-menopausal and sedentary lifestyle. Current diabetic treatment includes oral agent (dual therapy). She is compliant with treatment most of the time. Her weight is stable. She is following a low salt  and diabetic diet. When asked about meal planning, she reported none. She has not had a previous visit with a dietician. She rarely  participates in exercise. Her breakfast blood glucose is taken between 7-8 am. Her breakfast blood glucose range is generally 90-110 mg/dl. Her overall blood glucose range is 90-110 mg/dl. An ACE inhibitor/angiotensin II receptor blocker is not being taken. She sees a podiatrist.Eye exam is current.  Overactive bladder Has occasions when has trouble voiding-on no meds currently Atrial fib/ hx pulmonary emboli Currently on metoprolol and xeralto- no bleeding Sarcoidosis On prednisone chronic- no recent flare ups Osteoarthritis/wheel chair stricken Has a lot of stiffness but prednisone helps that and pain meds help with pain Physical deconditioning Has actually lost weight since hip fracture Chronic pain Meds On chronic pain meds due to back pain and hip pain- she takes norco 10/325 2 times a day- but says that she does not take it BID every day. She has not had a pain assessment nor signed contract- will discuss with patient.  Review of Systems  Constitutional: Negative.   HENT: Negative.   Respiratory: Negative.   Cardiovascular: Negative.   Gastrointestinal: Negative.   Genitourinary: Negative.   Neurological: Negative.   Psychiatric/Behavioral: Negative.   All other systems reviewed and are negative.      Objective:   Physical Exam  Constitutional: She is oriented to person, place, and time. She appears well-developed and well-nourished.  HENT:  Right Ear: External ear normal.  Left Ear: External ear normal.  Nose: Nose normal.  Mouth/Throat: Oropharynx is clear and moist.  Cardiovascular: Normal rate, regular rhythm and normal heart sounds.   Pulmonary/Chest: Effort normal and breath sounds normal.  Abdominal: Soft. Bowel sounds are normal.  Musculoskeletal:  Wheel chair confined- and lower extremities are very weak against resistance.  Neurological: She is alert and oriented to person, place, and time.  Skin: Skin is warm.  multiple scrapes and cuts bil lower ext    Psychiatric: She has a normal mood and affect. Her behavior is normal. Judgment and thought content normal.   BP 136/70 (BP Location: Left Arm, Cuff Size: Normal)   Pulse 60   Temp (!) 96.9 F (36.1 C) (Oral)       Assessment & Plan:  1. Atrial fibrillation with RVR ([place]) Keep follow up with cardiology - rivaroxaban (XARELTO) 20 MG TABS tablet; TAKE ONE TABLET BY MOUTH ONE TIME DAILY WITH SUPPER  Dispense: 90 tablet; Refill: 1  2. Essential hypertension Do not add salt to diet - metoprolol tartrate (LOPRESSOR) 25 MG tablet; Take 1 tablet (25 mg total) by mouth 2 (two) times daily.  Dispense: 180 tablet; Refill: 1 - CMP14+EGFR  3. Osteoarthritis, unspecified osteoarthritis type, unspecified site Cannot do weight bearing exercises  4. OAB (overactive bladder)  5. Chronic pain syndrome (LEGS/BACK) Pain policy discussed with patient- she wiil RTO for visit - HYDROcodone-acetaminophen (NORCO) 10-325 MG tablet; Take 1 tablet by mouth 2 (two) times daily as needed.  Dispense: 60 tablet; Refill: 0  6. Mixed hyperlipidemia Low fat diet - Lipid panel  7. Physical deconditioning Increase protein in diet  8. Protein-calorie malnutrition, severe Again increase protein in diet  9. 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  10. Wheelchair bound Fall precautions  11. Type 2 diabetes mellitus with complication, without long-term current use of insulin ([facility]) Continue to watch carbs in diet - glimepiride (AMARYL) 2 MG tablet; Take 1 tablet (2 mg total) by mouth daily.  Dispense: 90 tablet; Refill: 1 -  metFORMIN (GLUCOPHAGE) 500 MG tablet; Take 1 tablet (500 mg total) by mouth daily.  Dispense: 90 tablet; Refill: 1 - Bayer DCA Hb A1c Waived   Encouraged to have eye exam Labs pending Health maintenance reviewed Diet and exercise encouraged Continue all meds Follow up  In 3 months   [place], FNP

## 2016-06-06 LAB — CMP14+EGFR
A/G RATIO: 1.1 — AB (ref 1.2–2.2)
ALT: 8 IU/L (ref 0–32)
AST: 14 IU/L (ref 0–40)
Albumin: 3.7 g/dL (ref 3.5–4.8)
Alkaline Phosphatase: 72 IU/L (ref 39–117)
BUN/Creatinine Ratio: 26 (ref 12–28)
BUN: 14 mg/dL (ref 8–27)
Bilirubin Total: 0.3 mg/dL (ref 0.0–1.2)
CALCIUM: 9.5 mg/dL (ref 8.7–10.3)
CO2: 28 mmol/L (ref 18–29)
CREATININE: 0.53 mg/dL — AB (ref 0.57–1.00)
Chloride: 101 mmol/L (ref 96–106)
GFR, EST AFRICAN AMERICAN: 107 mL/min/{1.73_m2} (ref >59)
GFR, EST NON AFRICAN AMERICAN: 93 mL/min/{1.73_m2} (ref >59)
GLOBULIN, TOTAL: 3.5 g/dL (ref 1.5–4.5)
Glucose: 205 mg/dL — ABNORMAL HIGH (ref 65–99)
Potassium: 3.8 mmol/L (ref 3.5–5.2)
Sodium: 142 mmol/L (ref 134–144)
TOTAL PROTEIN: 7.2 g/dL (ref 6.0–8.5)

## 2016-06-06 LAB — LIPID PANEL
CHOL/HDL RATIO: 4.5 ratio — AB (ref 0.0–4.4)
Cholesterol, Total: 170 mg/dL (ref 100–199)
HDL: 38 mg/dL — AB (ref >39)
LDL CALC: 104 mg/dL — AB (ref 0–99)
TRIGLYCERIDES: 142 mg/dL (ref 0–149)
VLDL CHOLESTEROL CAL: 28 mg/dL (ref 5–40)

## 2016-06-12 ENCOUNTER — Ambulatory Visit (INDEPENDENT_AMBULATORY_CARE_PROVIDER_SITE_OTHER): Payer: Medicare Other | Admitting: Nurse Practitioner

## 2016-06-12 ENCOUNTER — Encounter: Payer: Self-pay | Admitting: Nurse Practitioner

## 2016-06-12 VITALS — BP 151/80 | HR 93 | Temp 97.9°F

## 2016-06-12 DIAGNOSIS — Z79891 Long term (current) use of opiate analgesic: Secondary | ICD-10-CM | POA: Diagnosis not present

## 2016-06-12 DIAGNOSIS — G894 Chronic pain syndrome: Secondary | ICD-10-CM | POA: Diagnosis not present

## 2016-06-12 DIAGNOSIS — Z0289 Encounter for other administrative examinations: Secondary | ICD-10-CM | POA: Insufficient documentation

## 2016-06-12 MED ORDER — HYDROCODONE-ACETAMINOPHEN 10-325 MG PO TABS: 1.0000 | ORAL_TABLET | Freq: Two times a day (BID) | ORAL | 0 refills | Status: DC

## 2016-06-12 NOTE — Progress Notes (Signed)
Subjective:    Patient ID: Elizabeth Drake, female    DOB: October 16, 1940, 75 y.o.   MRN: [id number]  HPI Patient in today for pain assessment: [facility] Controlled Substance Abuse database reviewed- Yes If yes- were their any concerning findings : no concerns Depression screen [facility] 2/9 06/12/2016 06/05/2016 03/27/2016 01/16/2016 12/19/2015  Decreased Interest 0 0 0 0 0  Down, Depressed, Hopeless 0 0 0 0 0  PHQ - 2 Score 0 0 0 0 0    GAD 7 : Generalized Anxiety Score 06/12/2016  Nervous, Anxious, on Edge 0  Control/stop worrying 0  Worry too much - different things 0  Trouble relaxing 0  Restless 0  Easily annoyed or irritable 1  Afraid - awful might happen 0  Total GAD 7 Score 1  Anxiety Difficulty Somewhat difficult       [place] drug screen performed- No  SOAPP  0= never  1= seldom  2=sometimes  3= often  4= very often  How often do you have mood swings? 0 How often do you smoke a cigarette within an hour after waling up? 0 How often have you taken medication other than the way that it was prescribed?0 How often have you used illegal drugs in the past 5 years? 0 How often, in your lifetime, have you had legal problems or been arrested? 0  Score 0  Alcohol Audit - How often during the last year have found that you: 0-Never   1- Less than monthly   2- Monthly     3-Weekly     4-daily or almost daily  - found that you were not able to stop drinking once you started- 0 -failed to do what was normally expected of you because of drinking- 0 -needed a first drink in the morning- 0 -had a feeling of guilt or remorse after drinking- 0 -are/were unable to remember what happened the night before because of your drinking- 0  0- NO   2- yes but not in last year  4- yes during last year -Have you or someone else been injured because of your drinking- 0 - Has anyone been concerned about your drinking or suggested you cut down- 0        TOTAL- 0  ( 0-7- alcohol education,  8-15- simple advice, 16-19 simple advice plus counseling, 20-40 referral for evaluation and treatment 0   Designated Pharmacy- [facility]  Pain assessment: Cause of pain- Has had pain for many years and is not sure of cause- fell and broke hip in 2016 Pain location- legs,feet and back Pain on scale of 1-10- 3-4/10 Frequency- daily What increases pain- worse when she is at home What makes pain Better-getting out and taking her pain pills Effects on ADL - she is in a wheelchair and has been for years but she is able to do her own ADL's  Prior treatments tried and failed- Has had PT in th epast and injections in back Current treatments-  Hydrocodone 10/325 Morphine mg equivalent- 20  Pain management agreement reviewed and signed- Yes     Review of Systems  Constitutional: Positive for chills.  Respiratory: Negative.   Cardiovascular: Negative.   Musculoskeletal: Positive for back pain and myalgias.  Neurological: Negative.   Psychiatric/Behavioral: Negative.   All other systems reviewed and are negative.      Objective:   Physical Exam  No physical exam today     Assessment & Plan:  1. Chronic pain syndrome (LEGS/BACK)  Fall precautions - HYDROcodone-acetaminophen (NORCO) 10-325 MG tablet; Take 1 tablet by mouth 2 (two) times daily.  Dispense: 60 tablet; Refill: 0 - HYDROcodone-acetaminophen (NORCO) 10-325 MG tablet; Take 1 tablet by mouth 2 (two) times daily.  Dispense: 60 tablet; Refill: 0  2. Encounter for long-term use of opiate analgesic  3. Pain management contract agreement  Follow up in 3 months   [place], FNP

## 2016-06-14 ENCOUNTER — Ambulatory Visit: Payer: Medicare Other | Admitting: Nurse Practitioner

## 2016-07-24 DIAGNOSIS — L84 Corns and callosities: Secondary | ICD-10-CM | POA: Diagnosis not present

## 2016-07-24 DIAGNOSIS — B351 Tinea unguium: Secondary | ICD-10-CM | POA: Diagnosis not present

## 2016-07-24 DIAGNOSIS — E1151 Type 2 diabetes mellitus with diabetic peripheral angiopathy without gangrene: Secondary | ICD-10-CM | POA: Diagnosis not present

## 2016-09-03 ENCOUNTER — Telehealth: Payer: Self-pay | Admitting: Nurse Practitioner

## 2016-09-03 NOTE — Telephone Encounter (Signed)
appt scheduled Pt notified 

## 2016-09-03 NOTE — Telephone Encounter (Signed)
Wanted rx for hydrocodone. Hung up when I told her she ntbs to get it.

## 2016-09-05 ENCOUNTER — Ambulatory Visit: Payer: Medicare Other | Admitting: Nurse Practitioner

## 2016-09-05 ENCOUNTER — Encounter: Payer: Self-pay | Admitting: Nurse Practitioner

## 2016-09-05 ENCOUNTER — Ambulatory Visit (INDEPENDENT_AMBULATORY_CARE_PROVIDER_SITE_OTHER): Payer: Medicare Other | Admitting: Nurse Practitioner

## 2016-09-05 VITALS — BP 124/60 | HR 92 | Temp 97.6°F

## 2016-09-05 DIAGNOSIS — I10 Essential (primary) hypertension: Secondary | ICD-10-CM

## 2016-09-05 DIAGNOSIS — N3281 Overactive bladder: Secondary | ICD-10-CM | POA: Diagnosis not present

## 2016-09-05 DIAGNOSIS — E782 Mixed hyperlipidemia: Secondary | ICD-10-CM

## 2016-09-05 DIAGNOSIS — M1990 Unspecified osteoarthritis, unspecified site: Secondary | ICD-10-CM | POA: Diagnosis not present

## 2016-09-05 DIAGNOSIS — G894 Chronic pain syndrome: Secondary | ICD-10-CM | POA: Diagnosis not present

## 2016-09-05 DIAGNOSIS — I4891 Unspecified atrial fibrillation: Secondary | ICD-10-CM

## 2016-09-05 DIAGNOSIS — E118 Type 2 diabetes mellitus with unspecified complications: Secondary | ICD-10-CM

## 2016-09-05 LAB — BAYER DCA HB A1C WAIVED: HB A1C (BAYER DCA - WAIVED): 6.3 % (ref <7.0)

## 2016-09-05 MED ORDER — HYDROCODONE-ACETAMINOPHEN 10-325 MG PO TABS: 1.0000 | ORAL_TABLET | Freq: Two times a day (BID) | ORAL | 0 refills | Status: DC

## 2016-09-05 MED ORDER — HYDROCODONE-ACETAMINOPHEN 10-325 MG PO TABS: 1.0000 | ORAL_TABLET | Freq: Two times a day (BID) | ORAL | 0 refills | Status: DC | PRN

## 2016-09-05 NOTE — Progress Notes (Signed)
Subjective:    Patient ID: Elizabeth Drake, female    DOB: [birth year], 76 y.o.   MRN: [id number]  HPI   Patient here today for follow up of chronic medical problems. SHe has no complaint today.  Outpatient Encounter Prescriptions as of 09/05/2016  Medication Sig  . acetaminophen (TYLENOL) 500 MG tablet Take 2 tablets (1,000 mg total) by mouth every 8 (eight) hours.  . calcium-vitamin D (OSCAL 500/200 D-3) 500-200 MG-UNIT per tablet Take 1 tablet by mouth daily with breakfast.  . feeding supplement (BOOST / RESOURCE BREEZE) LIQD Take 1 Container by mouth 2 (two) times daily between meals.  Marland Kitchen glimepiride (AMARYL) 2 MG tablet Take 1 tablet (2 mg total) by mouth daily.  Marland Kitchen HYDROcodone-acetaminophen (NORCO) 10-325 MG tablet Take 1 tablet by mouth 2 (two) times daily as needed.  Marland Kitchen HYDROcodone-acetaminophen (NORCO) 10-325 MG tablet Take 1 tablet by mouth 2 (two) times daily.  Marland Kitchen HYDROcodone-acetaminophen (NORCO) 10-325 MG tablet Take 1 tablet by mouth 2 (two) times daily.  . metFORMIN (GLUCOPHAGE) 500 MG tablet Take 1 tablet (500 mg total) by mouth daily.  . metoprolol tartrate (LOPRESSOR) 25 MG tablet Take 1 tablet (25 mg total) by mouth 2 (two) times daily.  . mometasone (NASONEX) 50 MCG/ACT nasal spray 2 sprays per nostril qd (Patient taking differently: Place 2 sprays into the nose daily as needed (allergies). 2 sprays per nostril qd)  . polyethylene glycol (MIRALAX / GLYCOLAX) packet Take 17 g by mouth daily as needed.  . predniSONE (DELTASONE) 5 MG tablet Take 1 tablet (5 mg total) by mouth daily with breakfast.  . rivaroxaban (XARELTO) 20 MG TABS tablet TAKE ONE TABLET BY MOUTH ONE TIME DAILY WITH SUPPER  . senna (SENOKOT) 8.6 MG TABS tablet Take 2 tablets (17.2 mg total) by mouth at bedtime.  . [DISCONTINUED] doxycycline (VIBRA-TABS) 100 MG tablet Take 1 tablet (100 mg total) by mouth 2 (two) times daily.   No facility-administered encounter medications on file as of 09/05/2016.    * SINCE  PATIENTS HP FRACTURE EARLIER IN THE YEAR - she has been in wheel chair every since.  Hypertension This is a chronic problem. The current episode started more than 1 year ago. The problem is unchanged. The problem is controlled. Pertinent negatives include no chest pain, headaches, neck pain, palpitations, peripheral edema or shortness of breath. There are no associated agents to hypertension. Risk factors for coronary artery disease include dyslipidemia, family history, post-menopausal state and sedentary lifestyle. Past treatments include beta blockers. The current treatment provides moderate improvement. Hypertensive end-organ damage includes CAD/MI.  Hyperlipidemia This is a chronic problem. The current episode started more than 1 year ago. The problem is controlled. Recent lipid tests were reviewed and are normal. Exacerbating diseases include diabetes. She has no history of hypothyroidism or obesity. Pertinent negatives include no chest pain or shortness of breath. Current antihyperlipidemic treatment includes diet change. The current treatment provides moderate improvement of lipids. Compliance problems include adherence to exercise.  Risk factors for coronary artery disease include post-menopausal, hypertension, diabetes mellitus, dyslipidemia and family history.  Diabetes She presents for her follow-up diabetic visit. She has type 2 diabetes mellitus. No MedicAlert identification noted. Her disease course has been stable. Pertinent negatives for hypoglycemia include no headaches. Pertinent negatives for diabetes include no chest pain, no polydipsia, no polyphagia, no polyuria, no weakness and no weight loss. Symptoms are stable. Risk factors for coronary artery disease include diabetes mellitus, dyslipidemia, family history, hypertension, post-menopausal and sedentary  lifestyle. Current diabetic treatment includes oral agent (dual therapy). She is compliant with treatment most of the time. Her weight  is stable. She is following a low salt and diabetic diet. When asked about meal planning, she reported none. She has not had a previous visit with a dietician. She rarely participates in exercise. Her breakfast blood glucose is taken between 7-8 am. Her breakfast blood glucose range is generally 90-110 mg/dl. Her overall blood glucose range is 90-110 mg/dl. An ACE inhibitor/angiotensin II receptor blocker is not being taken. She sees a podiatrist.Eye exam is current.  Overactive bladder Has occasions when has trouble voiding-on no meds currently Atrial fib/ hx pulmonary emboli Currently on metoprolol and xeralto- no bleeding Sarcoidosis On prednisone chronic- no recent flare ups Osteoarthritis/wheel chair stricken Has a lot of stiffness but prednisone helps that and pain meds help with pain Physical deconditioning Has actually lost weight since hip fracture Chronic pain Meds On chronic pain meds due to back pain and hip pain- she takes norco 10/325 2 times a day. Pain assessment: Cause of pain- fall and fracture of hip Pain location- left hip Pain on scale of 1-10- 5-10/10 Frequency- daily What increases pain-movement- patient says in wheel chair most of day What makes pain Better-pain pills help some Effects on ADL - has to have husband to help her Any change in general medical condition-none  Current medications- norco 10/325 BID Effectiveness of current meds-make pain tolerabe Adverse reactions form pain meds-- none Morphine equivalent- >20  Pill count performed-No Urine drug screen- No Was the [place] reviewed- yes  If yes were their any concerning findings? - no suspicious findings  Review of Systems  Constitutional: Negative.   HENT: Negative.   Respiratory: Negative.   Cardiovascular: Negative.   Gastrointestinal: Negative.   Genitourinary: Negative.   Neurological: Negative.   Psychiatric/Behavioral: Negative.   All other systems reviewed and are negative.        Objective:   Physical Exam  Constitutional: She is oriented to person, place, and time. She appears well-developed and well-nourished.  HENT:  Right Ear: External ear normal.  Left Ear: External ear normal.  Nose: Nose normal.  Mouth/Throat: Oropharynx is clear and moist.  Cardiovascular: Normal rate, regular rhythm and normal heart sounds.   Pulmonary/Chest: Effort normal and breath sounds normal.  Abdominal: Soft. Bowel sounds are normal.  Musculoskeletal:  Wheel chair confined- and lower extremities are very weak against resistance.  Neurological: She is alert and oriented to person, place, and time.  Skin: Skin is warm.  multiple scrapes and cuts bil lower ext  Psychiatric: She has a normal mood and affect. Her behavior is normal. Judgment and thought content normal.   BP 124/60   Pulse 92   Temp 97.6 F (36.4 C) (Oral)   Hgba1c 6.7%    Assessment & Plan:  1. Essential hypertension Low sodium diet - CMP14+EGFR  2. Mixed hyperlipidemia Low fat diet - Lipid panel  3. Type 2 diabetes mellitus with complication, without long-term current use of insulin (HCC) Continue to watch carbs in diet - Bayer DCA Hb A1c Waived  4. Atrial fibrillation with RVR ([place])  5. Osteoarthritis, unspecified osteoarthritis type, unspecified site  6. OAB (overactive bladder)  7. Chronic pain syndrome (LEGS/BACK) - HYDROcodone-acetaminophen (NORCO) 10-325 MG tablet; Take 1 tablet by mouth 2 (two) times daily as needed.  Dispense: 60 tablet; Refill: 0 - HYDROcodone-acetaminophen (NORCO) 10-325 MG tablet; Take 1 tablet by mouth 2 (two) times daily.  Dispense: 60  tablet; Refill: 0 - HYDROcodone-acetaminophen (NORCO) 10-325 MG tablet; Take 1 tablet by mouth 2 (two) times daily.  Dispense: 60 tablet; Refill: 0    Labs pending Health maintenance reviewed Diet and exercise encouraged Continue all meds Follow up  In 3 months   [place], FNP

## 2016-09-05 NOTE — Patient Instructions (Signed)
Fall Prevention in the Home Introduction Falls can cause injuries. They can happen to people of all ages. There are many things you can do to make your home safe and to help prevent falls. What can I do on the outside of my home?  Regularly fix the edges of walkways and driveways and fix any cracks.  Remove anything that might make you trip as you walk through a door, such as a raised step or threshold.  Trim any bushes or trees on the path to your home.  Use bright outdoor lighting.  Clear any walking paths of anything that might make someone trip, such as rocks or tools.  Regularly check to see if handrails are loose or broken. Make sure that both sides of any steps have handrails.  Any raised decks and porches should have guardrails on the edges.  Have any leaves, snow, or ice cleared regularly.  Use sand or salt on walking paths during winter.  Clean up any spills in your garage right away. This includes oil or grease spills. What can I do in the bathroom?  Use night lights.  Install grab bars by the toilet and in the tub and shower. Do not use towel bars as grab bars.  Use non-skid mats or decals in the tub or shower.  If you need to sit down in the shower, use a plastic, non-slip stool.  Keep the floor dry. Clean up any water that spills on the floor as soon as it happens.  Remove soap buildup in the tub or shower regularly.  Attach bath mats securely with double-sided non-slip rug tape.  Do not have throw rugs and other things on the floor that can make you trip. What can I do in the bedroom?  Use night lights.  Make sure that you have a light by your bed that is easy to reach.  Do not use any sheets or blankets that are too big for your bed. They should not hang down onto the floor.  Have a firm chair that has side arms. You can use this for support while you get dressed.  Do not have throw rugs and other things on the floor that can make you trip. What can  I do in the kitchen?  Clean up any spills right away.  Avoid walking on wet floors.  Keep items that you use a lot in easy-to-reach places.  If you need to reach something above you, use a strong step stool that has a grab bar.  Keep electrical cords out of the way.  Do not use floor polish or wax that makes floors slippery. If you must use wax, use non-skid floor wax.  Do not have throw rugs and other things on the floor that can make you trip. What can I do with my stairs?  Do not leave any items on the stairs.  Make sure that there are handrails on both sides of the stairs and use them. Fix handrails that are broken or loose. Make sure that handrails are as long as the stairways.  Check any carpeting to make sure that it is firmly attached to the stairs. Fix any carpet that is loose or worn.  Avoid having throw rugs at the top or bottom of the stairs. If you do have throw rugs, attach them to the floor with carpet tape.  Make sure that you have a light switch at the top of the stairs and the bottom of the stairs. If you   do not have them, ask someone to add them for you. What else can I do to help prevent falls?  Wear shoes that:  Do not have high heels.  Have rubber bottoms.  Are comfortable and fit you well.  Are closed at the toe. Do not wear sandals.  If you use a stepladder:  Make sure that it is fully opened. Do not climb a closed stepladder.  Make sure that both sides of the stepladder are locked into place.  Ask someone to hold it for you, if possible.  Clearly mark and make sure that you can see:  Any grab bars or handrails.  First and last steps.  Where the edge of each step is.  Use tools that help you move around (mobility aids) if they are needed. These include:  Canes.  Walkers.  Scooters.  Crutches.  Turn on the lights when you go into a dark area. Replace any light bulbs as soon as they burn out.  Set up your furniture so you have a  clear path. Avoid moving your furniture around.  If any of your floors are uneven, fix them.  If there are any pets around you, be aware of where they are.  Review your medicines with your doctor. Some medicines can make you feel dizzy. This can increase your chance of falling. Ask your doctor what other things that you can do to help prevent falls. This information is not intended to replace advice given to you by your health care provider. Make sure you discuss any questions you have with your health care provider. Document Released: 05/19/2009 Document Revised: 12/29/2015 Document Reviewed: 08/27/2014  2017 Elsevier  

## 2016-09-06 LAB — CMP14+EGFR
A/G RATIO: 1 — AB (ref 1.2–2.2)
ALK PHOS: 67 IU/L (ref 39–117)
ALT: 7 IU/L (ref 0–32)
AST: 16 IU/L (ref 0–40)
Albumin: 3.9 g/dL (ref 3.5–4.8)
BUN/Creatinine Ratio: 32 — ABNORMAL HIGH (ref 12–28)
BUN: 20 mg/dL (ref 8–27)
Bilirubin Total: 0.4 mg/dL (ref 0.0–1.2)
CALCIUM: 9.2 mg/dL (ref 8.7–10.3)
CO2: 27 mmol/L (ref 18–29)
Chloride: 103 mmol/L (ref 96–106)
Creatinine, Ser: 0.62 mg/dL (ref 0.57–1.00)
GFR calc Af Amer: 101 mL/min/{1.73_m2} (ref >59)
GFR, EST NON AFRICAN AMERICAN: 88 mL/min/{1.73_m2} (ref >59)
GLOBULIN, TOTAL: 3.8 g/dL (ref 1.5–4.5)
Glucose: 236 mg/dL — ABNORMAL HIGH (ref 65–99)
POTASSIUM: 4 mmol/L (ref 3.5–5.2)
SODIUM: 142 mmol/L (ref 134–144)
Total Protein: 7.7 g/dL (ref 6.0–8.5)

## 2016-09-06 LAB — LIPID PANEL
CHOL/HDL RATIO: 3.5 ratio (ref 0.0–4.4)
Cholesterol, Total: 171 mg/dL (ref 100–199)
HDL: 49 mg/dL (ref >39)
LDL Calculated: 86 mg/dL (ref 0–99)
TRIGLYCERIDES: 178 mg/dL — AB (ref 0–149)
VLDL Cholesterol Cal: 36 mg/dL (ref 5–40)

## 2016-09-25 ENCOUNTER — Other Ambulatory Visit: Payer: Self-pay | Admitting: Nurse Practitioner

## 2016-09-25 DIAGNOSIS — M1990 Unspecified osteoarthritis, unspecified site: Secondary | ICD-10-CM

## 2016-10-05 ENCOUNTER — Other Ambulatory Visit: Payer: Self-pay | Admitting: Nurse Practitioner

## 2016-10-08 NOTE — Telephone Encounter (Signed)
Spoke to pt and she does have refills. No other concerns voiced.

## 2016-10-08 NOTE — Telephone Encounter (Signed)
I gave her 3 pain rx at end of january she should have refills at pharmacy

## 2016-10-21 ENCOUNTER — Other Ambulatory Visit: Payer: Self-pay | Admitting: Nurse Practitioner

## 2016-10-21 DIAGNOSIS — I4891 Unspecified atrial fibrillation: Secondary | ICD-10-CM

## 2016-11-13 DIAGNOSIS — B351 Tinea unguium: Secondary | ICD-10-CM | POA: Diagnosis not present

## 2016-11-13 DIAGNOSIS — M79675 Pain in left toe(s): Secondary | ICD-10-CM | POA: Diagnosis not present

## 2016-11-13 DIAGNOSIS — L84 Corns and callosities: Secondary | ICD-10-CM | POA: Diagnosis not present

## 2016-11-13 DIAGNOSIS — E1151 Type 2 diabetes mellitus with diabetic peripheral angiopathy without gangrene: Secondary | ICD-10-CM | POA: Diagnosis not present

## 2016-12-04 ENCOUNTER — Other Ambulatory Visit: Payer: Self-pay | Admitting: Nurse Practitioner

## 2016-12-04 NOTE — Telephone Encounter (Signed)
Has to be seen to get pain meds

## 2016-12-05 NOTE — Telephone Encounter (Signed)
Scheduled patient for appointment with Chevis Pretty on 12/07/16 for refill.

## 2016-12-07 ENCOUNTER — Encounter: Payer: Self-pay | Admitting: Nurse Practitioner

## 2016-12-07 ENCOUNTER — Ambulatory Visit (INDEPENDENT_AMBULATORY_CARE_PROVIDER_SITE_OTHER): Payer: Medicare Other | Admitting: Nurse Practitioner

## 2016-12-07 VITALS — BP 139/73 | HR 71 | Temp 97.3°F

## 2016-12-07 DIAGNOSIS — I4891 Unspecified atrial fibrillation: Secondary | ICD-10-CM | POA: Diagnosis not present

## 2016-12-07 DIAGNOSIS — M810 Age-related osteoporosis without current pathological fracture: Secondary | ICD-10-CM | POA: Diagnosis not present

## 2016-12-07 DIAGNOSIS — E118 Type 2 diabetes mellitus with unspecified complications: Secondary | ICD-10-CM | POA: Diagnosis not present

## 2016-12-07 DIAGNOSIS — I10 Essential (primary) hypertension: Secondary | ICD-10-CM

## 2016-12-07 DIAGNOSIS — G894 Chronic pain syndrome: Secondary | ICD-10-CM | POA: Diagnosis not present

## 2016-12-07 DIAGNOSIS — E43 Unspecified severe protein-calorie malnutrition: Secondary | ICD-10-CM | POA: Diagnosis not present

## 2016-12-07 DIAGNOSIS — E782 Mixed hyperlipidemia: Secondary | ICD-10-CM | POA: Diagnosis not present

## 2016-12-07 DIAGNOSIS — Z993 Dependence on wheelchair: Secondary | ICD-10-CM

## 2016-12-07 LAB — BAYER DCA HB A1C WAIVED: HB A1C (BAYER DCA - WAIVED): 6.8 % (ref <7.0)

## 2016-12-07 MED ORDER — METOPROLOL TARTRATE 25 MG PO TABS: 25.0000 mg | ORAL_TABLET | Freq: Two times a day (BID) | ORAL | 1 refills | Status: DC

## 2016-12-07 MED ORDER — HYDROCODONE-ACETAMINOPHEN 10-325 MG PO TABS: 1.0000 | ORAL_TABLET | Freq: Two times a day (BID) | ORAL | 0 refills | Status: DC

## 2016-12-07 MED ORDER — METFORMIN HCL 500 MG PO TABS: 500.0000 mg | ORAL_TABLET | Freq: Every day | ORAL | 1 refills | Status: DC

## 2016-12-07 MED ORDER — GLIMEPIRIDE 2 MG PO TABS: 2.0000 mg | ORAL_TABLET | Freq: Every day | ORAL | 1 refills | Status: DC

## 2016-12-07 MED ORDER — HYDROCODONE-ACETAMINOPHEN 10-325 MG PO TABS: 1.0000 | ORAL_TABLET | Freq: Two times a day (BID) | ORAL | 0 refills | Status: DC | PRN

## 2016-12-07 MED ORDER — RIVAROXABAN 20 MG PO TABS: ORAL_TABLET | ORAL | 1 refills | Status: DC

## 2016-12-07 NOTE — Progress Notes (Signed)
Subjective:    Patient ID: Elizabeth Drake, female    DOB: 30-Sep-1940, 76 y.o.   MRN: [id number]  HPI  RUWAYDA CURET is here today for follow up of chronic medical problem.  Outpatient Encounter Prescriptions as of 12/07/2016  Medication Sig  . acetaminophen (TYLENOL) 500 MG tablet Take 2 tablets (1,000 mg total) by mouth every 8 (eight) hours.  . calcium-vitamin D (OSCAL 500/200 D-3) 500-200 MG-UNIT per tablet Take 1 tablet by mouth daily with breakfast.  . feeding supplement (BOOST / RESOURCE BREEZE) LIQD Take 1 Container by mouth 2 (two) times daily between meals.  Marland Kitchen glimepiride (AMARYL) 2 MG tablet Take 1 tablet (2 mg total) by mouth daily.  Marland Kitchen HYDROcodone-acetaminophen (NORCO) 10-325 MG tablet Take 1 tablet by mouth 2 (two) times daily as needed.  Marland Kitchen HYDROcodone-acetaminophen (NORCO) 10-325 MG tablet Take 1 tablet by mouth 2 (two) times daily.  Marland Kitchen HYDROcodone-acetaminophen (NORCO) 10-325 MG tablet Take 1 tablet by mouth 2 (two) times daily.  . metFORMIN (GLUCOPHAGE) 500 MG tablet Take 1 tablet (500 mg total) by mouth daily.  . metoprolol tartrate (LOPRESSOR) 25 MG tablet Take 1 tablet (25 mg total) by mouth 2 (two) times daily.  . mometasone (NASONEX) 50 MCG/ACT nasal spray 2 sprays per nostril qd (Patient taking differently: Place 2 sprays into the nose daily as needed (allergies). 2 sprays per nostril qd)  . polyethylene glycol (MIRALAX / GLYCOLAX) packet Take 17 g by mouth daily as needed.  . predniSONE (DELTASONE) 5 MG tablet TAKE 1 TABLET (5 MG TOTAL) BY MOUTH DAILY WITH BREAKFAST.  . rivaroxaban (XARELTO) 20 MG TABS tablet TAKE ONE TABLET BY MOUTH ONE TIME DAILY WITH SUPPER  . senna (SENOKOT) 8.6 MG TABS tablet Take 2 tablets (17.2 mg total) by mouth at bedtime.  Alveda Reasons 20 MG TABS tablet TAKE ONE TABLET BY MOUTH ONE TIME DAILY WITH SUPPER   No facility-administered encounter medications on file as of 12/07/2016.     1. Essential hypertension  No c/o chest pain, SOB or headache-  does not check blood pressure at home  2. Mixed hyperlipidemia  Doe snot watch diet but does not eat much either  3. Type 2 diabetes mellitus with complication, without long-term current use of insulin (HCC) Last HGBA1c was 6.3-  Does not check blood sugars at home very often  4. Atrial fibrillation with RVR ([place])   takes xeralto daily without problems  5. Age-related osteoporosis without current pathological fracture  always has back pain- is unable to do any weight earing exercises  6. Chronic pain syndrome (LEGS/BACK) Pain assessment: Cause of pain- she has had pain for many years- fell and broke hip in 2016 and is bound to wheel chair every since Pain location- back, legs and feet Pain on scale of 1-10- 3/10 currently but can get ashigh as 8/10 Frequency-daily What increases pain-nothing really because she really is not able to do a whole lot What makes pain Better-pain meds help Effects on ADL - payient has to have help with most of her ADL's Any change in general medical condition-no  Current medications- norco 10/325 2 x a day Effectiveness of current meds-makes pain tolerable Adverse reactions form pain meds-none Morphine equivalent- 20  Pill count performed-No Urine drug screen- No Was the [place] reviewed- yes  If yes were their any concerning findings? - no issues     7. Wheelchair bound   unable to walk  8. Protein-calorie malnutrition, severe  Very poor diet  New complaints: None today     Review of Systems  Constitutional: Negative.   Respiratory: Negative.   Cardiovascular: Negative.   Musculoskeletal: Positive for back pain and gait problem.  Psychiatric/Behavioral: Negative.   All other systems reviewed and are negative.      Objective:   Physical Exam  Constitutional: She is oriented to person, place, and time. She appears well-developed and well-nourished. No distress.  HENT:  Nose: Nose normal.  Mouth/Throat: Oropharynx is clear and moist.   Eyes: EOM are normal.  Neck: Trachea normal, normal range of motion and full passive range of motion without pain. Neck supple. No JVD present. Carotid bruit is not present. No thyromegaly present.  Cardiovascular: Normal rate, regular rhythm, normal heart sounds and intact distal pulses.  Exam reveals no gallop and no friction rub.   No murmur heard. Pulmonary/Chest: Effort normal and breath sounds normal.  Abdominal: Soft. Bowel sounds are normal. She exhibits no distension and no mass. There is no tenderness.  Musculoskeletal: Normal range of motion.  In wheel chair- back pain with any movement (+) SLr bil  Lymphadenopathy:    She has no cervical adenopathy.  Neurological: She is alert and oriented to person, place, and time. She has normal reflexes.  Skin: Skin is warm and dry.  Psychiatric: She has a normal mood and affect. Her behavior is normal. Judgment and thought content normal.   BP 139/73   Pulse 71   Temp 97.3 F (36.3 C) (Oral)   Hgba1c discussed at appointment 6.8%    Assessment & Plan:  1. Essential hypertension Low sodium diet - CMP14+EGFR - metoprolol tartrate (LOPRESSOR) 25 MG tablet; Take 1 tablet (25 mg total) by mouth 2 (two) times daily.  Dispense: 180 tablet; Refill: 1  2. Mixed hyperlipidemia Watch fat in diet - Lipid panel  3. Type 2 diabetes mellitus with complication, without long-term current use of insulin (HCC) Continue to watch carbs - Bayer DCA Hb A1c Waived - metFORMIN (GLUCOPHAGE) 500 MG tablet; Take 1 tablet (500 mg total) by mouth daily.  Dispense: 90 tablet; Refill: 1 - glimepiride (AMARYL) 2 MG tablet; Take 1 tablet (2 mg total) by mouth daily.  Dispense: 90 tablet; Refill: 1  4. Atrial fibrillation with RVR (HCC) Avoid caffeine - rivaroxaban (XARELTO) 20 MG TABS tablet; TAKE ONE TABLET BY MOUTH ONE TIME DAILY WITH SUPPER  Dispense: 90 tablet; Refill: 1  5. Age-related osteoporosis without current pathological fracture Fall  precautions  6. Chronic pain syndrome (LEGS/BACK) Reviewed pan contract - HYDROcodone-acetaminophen (NORCO) 10-325 MG tablet; Take 1 tablet by mouth 2 (two) times daily as needed.  Dispense: 60 tablet; Refill: 0 - HYDROcodone-acetaminophen (NORCO) 10-325 MG tablet; Take 1 tablet by mouth 2 (two) times daily.  Dispense: 60 tablet; Refill: 0 - HYDROcodone-acetaminophen (NORCO) 10-325 MG tablet; Take 1 tablet by mouth 2 (two) times daily.  Dispense: 60 tablet; Refill: 0  7. Wheelchair bound  8. Protein-calorie malnutrition, severe Increase protein in diet    Labs pending Health maintenance reviewed Diet and exercise encouraged Continue all meds Follow up  In 3 months   [place], FNP

## 2016-12-07 NOTE — Patient Instructions (Signed)
Fall Prevention in the Home Falls can cause injuries. They can happen to people of all ages. There are many things you can do to make your home safe and to help prevent falls. What can I do on the outside of my home?  Regularly fix the edges of walkways and driveways and fix any cracks.  Remove anything that might make you trip as you walk through a door, such as a raised step or threshold.  Trim any bushes or trees on the path to your home.  Use bright outdoor lighting.  Clear any walking paths of anything that might make someone trip, such as rocks or tools.  Regularly check to see if handrails are loose or broken. Make sure that both sides of any steps have handrails.  Any raised decks and porches should have guardrails on the edges.  Have any leaves, snow, or ice cleared regularly.  Use sand or salt on walking paths during winter.  Clean up any spills in your garage right away. This includes oil or grease spills. What can I do in the bathroom?  Use night lights.  Install grab bars by the toilet and in the tub and shower. Do not use towel bars as grab bars.  Use non-skid mats or decals in the tub or shower.  If you need to sit down in the shower, use a plastic, non-slip stool.  Keep the floor dry. Clean up any water that spills on the floor as soon as it happens.  Remove soap buildup in the tub or shower regularly.  Attach bath mats securely with double-sided non-slip rug tape.  Do not have throw rugs and other things on the floor that can make you trip. What can I do in the bedroom?  Use night lights.  Make sure that you have a light by your bed that is easy to reach.  Do not use any sheets or blankets that are too big for your bed. They should not hang down onto the floor.  Have a firm chair that has side arms. You can use this for support while you get dressed.  Do not have throw rugs and other things on the floor that can make you trip. What can I do in the  kitchen?  Clean up any spills right away.  Avoid walking on wet floors.  Keep items that you use a lot in easy-to-reach places.  If you need to reach something above you, use a strong step stool that has a grab bar.  Keep electrical cords out of the way.  Do not use floor polish or wax that makes floors slippery. If you must use wax, use non-skid floor wax.  Do not have throw rugs and other things on the floor that can make you trip. What can I do with my stairs?  Do not leave any items on the stairs.  Make sure that there are handrails on both sides of the stairs and use them. Fix handrails that are broken or loose. Make sure that handrails are as long as the stairways.  Check any carpeting to make sure that it is firmly attached to the stairs. Fix any carpet that is loose or worn.  Avoid having throw rugs at the top or bottom of the stairs. If you do have throw rugs, attach them to the floor with carpet tape.  Make sure that you have a light switch at the top of the stairs and the bottom of the stairs. If you do   not have them, ask someone to add them for you. What else can I do to help prevent falls?  Wear shoes that:  Do not have high heels.  Have rubber bottoms.  Are comfortable and fit you well.  Are closed at the toe. Do not wear sandals.  If you use a stepladder:  Make sure that it is fully opened. Do not climb a closed stepladder.  Make sure that both sides of the stepladder are locked into place.  Ask someone to hold it for you, if possible.  Clearly mark and make sure that you can see:  Any grab bars or handrails.  First and last steps.  Where the edge of each step is.  Use tools that help you move around (mobility aids) if they are needed. These include:  Canes.  Walkers.  Scooters.  Crutches.  Turn on the lights when you go into a dark area. Replace any light bulbs as soon as they burn out.  Set up your furniture so you have a clear path.  Avoid moving your furniture around.  If any of your floors are uneven, fix them.  If there are any pets around you, be aware of where they are.  Review your medicines with your doctor. Some medicines can make you feel dizzy. This can increase your chance of falling. Ask your doctor what other things that you can do to help prevent falls. This information is not intended to replace advice given to you by your health care provider. Make sure you discuss any questions you have with your health care provider. Document Released: 05/19/2009 Document Revised: 12/29/2015 Document Reviewed: 08/27/2014 Elsevier Interactive Patient Education  2017 [employer] Inc.  

## 2016-12-08 LAB — LIPID PANEL
CHOLESTEROL TOTAL: 171 mg/dL (ref 100–199)
Chol/HDL Ratio: 3.8 ratio (ref 0.0–4.4)
HDL: 45 mg/dL (ref >39)
LDL CALC: 95 mg/dL (ref 0–99)
Triglycerides: 154 mg/dL — ABNORMAL HIGH (ref 0–149)
VLDL CHOLESTEROL CAL: 31 mg/dL (ref 5–40)

## 2016-12-08 LAB — CMP14+EGFR
ALBUMIN: 3.8 g/dL (ref 3.5–4.8)
ALK PHOS: 64 IU/L (ref 39–117)
ALT: 6 IU/L (ref 0–32)
AST: 13 IU/L (ref 0–40)
Albumin/Globulin Ratio: 1 — ABNORMAL LOW (ref 1.2–2.2)
BUN / CREAT RATIO: 25 (ref 12–28)
BUN: 15 mg/dL (ref 8–27)
Bilirubin Total: 0.5 mg/dL (ref 0.0–1.2)
CO2: 24 mmol/L (ref 18–29)
CREATININE: 0.61 mg/dL (ref 0.57–1.00)
Calcium: 10 mg/dL (ref 8.7–10.3)
Chloride: 101 mmol/L (ref 96–106)
GFR calc Af Amer: 101 mL/min/{1.73_m2} (ref >59)
GFR calc non Af Amer: 88 mL/min/{1.73_m2} (ref >59)
GLUCOSE: 130 mg/dL — AB (ref 65–99)
Globulin, Total: 3.8 g/dL (ref 1.5–4.5)
Potassium: 3.7 mmol/L (ref 3.5–5.2)
Sodium: 141 mmol/L (ref 134–144)
TOTAL PROTEIN: 7.6 g/dL (ref 6.0–8.5)

## 2017-01-01 ENCOUNTER — Ambulatory Visit (INDEPENDENT_AMBULATORY_CARE_PROVIDER_SITE_OTHER): Payer: Medicare Other | Admitting: Nurse Practitioner

## 2017-01-01 VITALS — BP 128/71 | HR 60 | Temp 98.0°F

## 2017-01-01 DIAGNOSIS — R42 Dizziness and giddiness: Secondary | ICD-10-CM | POA: Diagnosis not present

## 2017-01-01 MED ORDER — MECLIZINE HCL 25 MG PO TABS: 25.0000 mg | ORAL_TABLET | Freq: Three times a day (TID) | ORAL | 0 refills | Status: DC | PRN

## 2017-01-01 NOTE — Patient Instructions (Signed)

## 2017-01-01 NOTE — Progress Notes (Signed)
   Subjective:    Patient ID: Elizabeth Drake, female    DOB: 1941-01-12, 76 y.o.   MRN: [id number]  HPI Patient comes into the office complaining of dizziness for 4 days.  Patient states today she feels much better, but if she holds her head a certain way the dizziness is worse.  She stayed in bed and had to lay down for 2 straight days to prevent dizziness.  Patient is accompanied by husband who states she seemed fine Friday night when she went to bed and until she sat up out of bed Saturday morning.  Certain positions and rapid movement of her head makes the dizziness worse.  Laying still makes the symptom better.     Review of Systems  Constitutional: Negative for appetite change and fatigue.  Respiratory: Negative for shortness of breath and wheezing.   Cardiovascular: Negative for chest pain and palpitations.  Gastrointestinal: Negative for abdominal distention and abdominal pain.  Neurological: Positive for dizziness (x4 days). Negative for headaches.  All other systems reviewed and are negative.      Objective:   Physical Exam  Constitutional: She is oriented to person, place, and time. She appears well-developed and well-nourished. No distress.  HENT:  Head: Normocephalic.  Right Ear: External ear normal.  Left Ear: External ear normal.  Nose: Nose normal.  Eyes: Pupils are equal, round, and reactive to light.  Neck: Normal range of motion. Neck supple. No JVD present. No thyromegaly present.  Cardiovascular: Normal rate, regular rhythm and normal heart sounds.   No murmur heard. Pulmonary/Chest: Effort normal and breath sounds normal. No respiratory distress.  Abdominal: Soft. Bowel sounds are normal. She exhibits no distension. There is no tenderness.  Musculoskeletal: Normal range of motion. She exhibits no edema.  Lymphadenopathy:    She has no cervical adenopathy.  Neurological: She is alert and oriented to person, place, and time.  Skin: Skin is warm and dry. No rash  noted.  Psychiatric: She has a normal mood and affect. Her behavior is normal. Judgment and thought content normal.   BP 128/71   Pulse 60   Temp 98 F (36.7 C) (Oral)       Assessment & Plan:   1. Vertigo    Meds ordered this encounter  Medications  . meclizine (ANTIVERT) 25 MG tablet    Sig: Take 1 tablet (25 mg total) by mouth 3 (three) times daily as needed for dizziness.    Dispense:  30 tablet    Refill:  0    Order Specific Question:   Supervising Provider    Answer:   Eustaquio Maize [[id number]]   Force fluids Rest  RTO prn  Mary-Margaret Hassell Done, FNP

## 2017-01-22 DIAGNOSIS — L84 Corns and callosities: Secondary | ICD-10-CM | POA: Diagnosis not present

## 2017-01-22 DIAGNOSIS — B351 Tinea unguium: Secondary | ICD-10-CM | POA: Diagnosis not present

## 2017-01-22 DIAGNOSIS — E1151 Type 2 diabetes mellitus with diabetic peripheral angiopathy without gangrene: Secondary | ICD-10-CM | POA: Diagnosis not present

## 2017-01-22 DIAGNOSIS — M79675 Pain in left toe(s): Secondary | ICD-10-CM | POA: Diagnosis not present

## 2017-03-06 ENCOUNTER — Telehealth: Payer: Self-pay | Admitting: Nurse Practitioner

## 2017-03-06 DIAGNOSIS — G894 Chronic pain syndrome: Secondary | ICD-10-CM

## 2017-03-06 MED ORDER — HYDROCODONE-ACETAMINOPHEN 10-325 MG PO TABS: 1.0000 | ORAL_TABLET | Freq: Two times a day (BID) | ORAL | 0 refills | Status: DC | PRN

## 2017-03-06 NOTE — Telephone Encounter (Signed)
What is the name of the medication? hydrocodone  Have you contacted your pharmacy to request a refill? no  Which pharmacy would you like this sent to? [facility]   Patient notified that their request is being sent to the clinical staff for review and that they should receive a call once it is complete. If they do not receive a call within 24 hours they can check with their pharmacy or our office.

## 2017-03-06 NOTE — Telephone Encounter (Signed)
Please review and advise MMM pt Pt has appt on 03/20/2017 Does not have med to last until appt

## 2017-03-06 NOTE — Telephone Encounter (Signed)
Patient aware and verbalizes understanding. 

## 2017-03-20 ENCOUNTER — Encounter: Payer: Self-pay | Admitting: Nurse Practitioner

## 2017-03-20 ENCOUNTER — Ambulatory Visit (INDEPENDENT_AMBULATORY_CARE_PROVIDER_SITE_OTHER): Payer: Medicare Other | Admitting: Nurse Practitioner

## 2017-03-20 VITALS — BP 138/74 | HR 79 | Temp 97.4°F

## 2017-03-20 DIAGNOSIS — Z993 Dependence on wheelchair: Secondary | ICD-10-CM

## 2017-03-20 DIAGNOSIS — I10 Essential (primary) hypertension: Secondary | ICD-10-CM

## 2017-03-20 DIAGNOSIS — E118 Type 2 diabetes mellitus with unspecified complications: Secondary | ICD-10-CM | POA: Diagnosis not present

## 2017-03-20 DIAGNOSIS — M810 Age-related osteoporosis without current pathological fracture: Secondary | ICD-10-CM

## 2017-03-20 DIAGNOSIS — E43 Unspecified severe protein-calorie malnutrition: Secondary | ICD-10-CM

## 2017-03-20 DIAGNOSIS — I4891 Unspecified atrial fibrillation: Secondary | ICD-10-CM | POA: Diagnosis not present

## 2017-03-20 DIAGNOSIS — E782 Mixed hyperlipidemia: Secondary | ICD-10-CM | POA: Diagnosis not present

## 2017-03-20 DIAGNOSIS — G894 Chronic pain syndrome: Secondary | ICD-10-CM

## 2017-03-20 LAB — BAYER DCA HB A1C WAIVED: HB A1C (BAYER DCA - WAIVED): 7 % — ABNORMAL HIGH (ref <7.0)

## 2017-03-20 LAB — CMP14+EGFR
ALT: 9 IU/L (ref 0–32)
AST: 15 IU/L (ref 0–40)
Albumin/Globulin Ratio: 1 — ABNORMAL LOW (ref 1.2–2.2)
Albumin: 3.8 g/dL (ref 3.5–4.8)
Alkaline Phosphatase: 60 IU/L (ref 39–117)
BILIRUBIN TOTAL: 0.4 mg/dL (ref 0.0–1.2)
BUN / CREAT RATIO: 22 (ref 12–28)
BUN: 15 mg/dL (ref 8–27)
CO2: 26 mmol/L (ref 20–29)
CREATININE: 0.68 mg/dL (ref 0.57–1.00)
Calcium: 9.4 mg/dL (ref 8.7–10.3)
Chloride: 104 mmol/L (ref 96–106)
GFR calc Af Amer: 98 mL/min/{1.73_m2} (ref >59)
GFR, EST NON AFRICAN AMERICAN: 85 mL/min/{1.73_m2} (ref >59)
GLUCOSE: 163 mg/dL — AB (ref 65–99)
Globulin, Total: 3.8 g/dL (ref 1.5–4.5)
Potassium: 3.5 mmol/L (ref 3.5–5.2)
Sodium: 144 mmol/L (ref 134–144)
Total Protein: 7.6 g/dL (ref 6.0–8.5)

## 2017-03-20 LAB — LIPID PANEL
Chol/HDL Ratio: 4.1 ratio (ref 0.0–4.4)
Cholesterol, Total: 180 mg/dL (ref 100–199)
HDL: 44 mg/dL (ref >39)
LDL CALC: 108 mg/dL — AB (ref 0–99)
Triglycerides: 142 mg/dL (ref 0–149)
VLDL CHOLESTEROL CAL: 28 mg/dL (ref 5–40)

## 2017-03-20 MED ORDER — HYDROCODONE-ACETAMINOPHEN 10-325 MG PO TABS: 1.0000 | ORAL_TABLET | Freq: Two times a day (BID) | ORAL | 0 refills | Status: DC

## 2017-03-20 MED ORDER — HYDROCODONE-ACETAMINOPHEN 10-325 MG PO TABS: 1.0000 | ORAL_TABLET | Freq: Two times a day (BID) | ORAL | 0 refills | Status: DC | PRN

## 2017-03-20 NOTE — Addendum Note (Signed)
Addended by: Rolena Infante on: 03/20/2017 10:40 AM   Modules accepted: Orders

## 2017-03-20 NOTE — Progress Notes (Signed)
Subjective:    Patient ID: Elizabeth Drake, female    DOB: Feb 26, 1941, 76 y.o.   MRN: [id number]  HPI  Elizabeth Drake is here today for follow up of chronic medical problem.  Outpatient Encounter Prescriptions as of 03/20/2017  Medication Sig  . acetaminophen (TYLENOL) 500 MG tablet Take 2 tablets (1,000 mg total) by mouth every 8 (eight) hours.  . calcium-vitamin D (OSCAL 500/200 D-3) 500-200 MG-UNIT per tablet Take 1 tablet by mouth daily with breakfast.  . feeding supplement (BOOST / RESOURCE BREEZE) LIQD Take 1 Container by mouth 2 (two) times daily between meals.  Marland Kitchen glimepiride (AMARYL) 2 MG tablet Take 1 tablet (2 mg total) by mouth daily.  Marland Kitchen HYDROcodone-acetaminophen (NORCO) 10-325 MG tablet Take 1 tablet by mouth 2 (two) times daily.  Marland Kitchen HYDROcodone-acetaminophen (NORCO) 10-325 MG tablet Take 1 tablet by mouth 2 (two) times daily.  Marland Kitchen HYDROcodone-acetaminophen (NORCO) 10-325 MG tablet Take 1 tablet by mouth 2 (two) times daily as needed.  . meclizine (ANTIVERT) 25 MG tablet Take 1 tablet (25 mg total) by mouth 3 (three) times daily as needed for dizziness.  . metFORMIN (GLUCOPHAGE) 500 MG tablet Take 1 tablet (500 mg total) by mouth daily.  . metoprolol tartrate (LOPRESSOR) 25 MG tablet Take 1 tablet (25 mg total) by mouth 2 (two) times daily.  . mometasone (NASONEX) 50 MCG/ACT nasal spray 2 sprays per nostril qd (Patient taking differently: Place 2 sprays into the nose daily as needed (allergies). 2 sprays per nostril qd)  . polyethylene glycol (MIRALAX / GLYCOLAX) packet Take 17 g by mouth daily as needed.  . predniSONE (DELTASONE) 5 MG tablet TAKE 1 TABLET (5 MG TOTAL) BY MOUTH DAILY WITH BREAKFAST.  . rivaroxaban (XARELTO) 20 MG TABS tablet TAKE ONE TABLET BY MOUTH ONE TIME DAILY WITH SUPPER  . senna (SENOKOT) 8.6 MG TABS tablet Take 2 tablets (17.2 mg total) by mouth at bedtime.   No facility-administered encounter medications on file as of 03/20/2017.     1. Essential  hypertension  No c/o chest pian, SOB or headache. Does not check blood pressure at home  2. Type 2 diabetes mellitus with complication, without long-term current use of insulin (HCC)  hgba1c 6.8%. She does not check her blood sugars very often. No hypoglycemia that aware of.  3. Mixed hyperlipidemia  Does not eat well at all-   4. Atrial fibrillation with RVR (HCC)  No palpitations  5. Age-related osteoporosis without current pathological fracture  Weight bearing exercise not possible- she is wheel chair bound  6. Wheelchair bound  Only get sout of wheel chair to go to bathroom and to sleep  7. Protein-calorie malnutrition, severe  Patient has a very poor diet  8. Chronic pain syndrome (LEGS/BACK)  Pain assessment: Cause of pain- osteoporosis and osteoarthritis Pain location- back and legs Pain on scale of 1-10- 8-9/10 when does not take medication Frequency- daily What increases pain-standing and movement What makes pain Better- pain meds help Effects on ADL - patient has to have help to do ADL"S Any change in general medical condition-no  Current medications- hydrocodone 10/325 BID Effectiveness of current meds-helps but never completely relieves pain Adverse reactions form pain meds- none Morphine equivalent 20  Pill count performed-No Urine drug screen- No Was the [place] reviewed- yes  If yes were their any concerning findings? - no issues  Pain contract signed on: 06/12/16     New complaints: None today  Social history: Lives with husband  and son- husband is her care giver   Review of Systems  Constitutional: Positive for appetite change (continues to decrease). Negative for activity change.  HENT: Negative.   Eyes: Negative for pain.  Respiratory: Negative for shortness of breath.   Cardiovascular: Negative for chest pain, palpitations and leg swelling.  Gastrointestinal: Negative for abdominal pain.  Endocrine: Negative for polydipsia.  Genitourinary:  Negative.   Musculoskeletal: Positive for arthralgias and back pain.  Skin: Negative for rash.  Neurological: Negative for dizziness, weakness and headaches.  Hematological: Does not bruise/bleed easily.  Psychiatric/Behavioral: Negative.   All other systems reviewed and are negative.      Objective:   Physical Exam  Constitutional: She is oriented to person, place, and time. She appears well-developed and well-nourished.  HENT:  Nose: Nose normal.  Mouth/Throat: Oropharynx is clear and moist.  Eyes: EOM are normal.  Neck: Trachea normal, normal range of motion and full passive range of motion without pain. Neck supple. No JVD present. Carotid bruit is not present. No thyromegaly present.  Cardiovascular: Normal rate, regular rhythm, normal heart sounds and intact distal pulses.  Exam reveals no gallop and no friction rub.   No murmur heard. Pulmonary/Chest: Effort normal and breath sounds normal.  Abdominal: Soft. Bowel sounds are normal. She exhibits no distension and no mass. There is no tenderness.  Musculoskeletal: Normal range of motion.  In wheelchair- unable to get on exam table  Lymphadenopathy:    She has no cervical adenopathy.  Neurological: She is alert and oriented to person, place, and time. She has normal reflexes.  Skin: Skin is warm and dry.  Psychiatric: She has a normal mood and affect. Her behavior is normal. Judgment and thought content normal.    BP 138/74   Pulse 79   Temp (!) 97.4 F (36.3 C) (Oral)   hgba1c 7.0%     Assessment & Plan:  1. Essential hypertension Low sodium diet - CMP14+EGFR  2. Type 2 diabetes mellitus with complication, without long-term current use of insulin (HCC) Watch carbs in diet - Bayer DCA Hb A1c Waived  3. Mixed hyperlipidemia Low fat - Lipid panel  4. Atrial fibrillation with RVR ([place]) Avoid caffeine Keep follow up appointmnet with cardiology  5. Age-related osteoporosis without current pathological  fracture Cannot tolerate weight bearing exercise  6. Wheelchair bound  7. Protein-calorie malnutrition, severe increase protein in diet  8. Chronic pain syndrome (LEGS/BACK) - HYDROcodone-acetaminophen (NORCO) 10-325 MG tablet; Take 1 tablet by mouth 2 (two) times daily.  Dispense: 60 tablet; Refill: 0 - HYDROcodone-acetaminophen (NORCO) 10-325 MG tablet; Take 1 tablet by mouth 2 (two) times daily.  Dispense: 60 tablet; Refill: 0 - HYDROcodone-acetaminophen (NORCO) 10-325 MG tablet; Take 1 tablet by mouth 2 (two) times daily as needed.  Dispense: 30 tablet; Refill: 0    Labs pending Health maintenance reviewed Diet and exercise encouraged Continue all meds Follow up  In 3 months   Kimberly, FNP

## 2017-03-20 NOTE — Patient Instructions (Signed)

## 2017-03-22 ENCOUNTER — Other Ambulatory Visit: Payer: Self-pay | Admitting: Nurse Practitioner

## 2017-03-22 ENCOUNTER — Telehealth: Payer: Self-pay | Admitting: Nurse Practitioner

## 2017-03-22 DIAGNOSIS — G894 Chronic pain syndrome: Secondary | ICD-10-CM

## 2017-03-22 MED ORDER — HYDROCODONE-ACETAMINOPHEN 10-325 MG PO TABS: 1.0000 | ORAL_TABLET | Freq: Two times a day (BID) | ORAL | 0 refills | Status: DC | PRN

## 2017-03-22 NOTE — Telephone Encounter (Signed)
Pt is out of pain meds and can't fill her new rx until 04/04/17. The last refill she only got #30 instead of her normal #60 and that is why she has ran out early. Spoke with Mitzi Hansen at [facility] and she filled #30 on 03/07/17 which is a 15 day supply. Can we write another rx for her.

## 2017-03-25 ENCOUNTER — Other Ambulatory Visit: Payer: Medicare Other

## 2017-03-25 DIAGNOSIS — E118 Type 2 diabetes mellitus with unspecified complications: Secondary | ICD-10-CM

## 2017-03-26 LAB — MICROALBUMIN / CREATININE URINE RATIO
Creatinine, Urine: 67.1 mg/dL
Microalb/Creat Ratio: 16.8 mg/g creat (ref 0.0–30.0)
Microalbumin, Urine: 11.3 ug/mL

## 2017-03-29 ENCOUNTER — Other Ambulatory Visit: Payer: Self-pay | Admitting: Nurse Practitioner

## 2017-03-29 ENCOUNTER — Other Ambulatory Visit: Payer: Self-pay | Admitting: *Deleted

## 2017-03-29 NOTE — Telephone Encounter (Signed)
Pt notified of RX 

## 2017-04-02 DIAGNOSIS — E1151 Type 2 diabetes mellitus with diabetic peripheral angiopathy without gangrene: Secondary | ICD-10-CM | POA: Diagnosis not present

## 2017-04-02 DIAGNOSIS — L84 Corns and callosities: Secondary | ICD-10-CM | POA: Diagnosis not present

## 2017-04-02 DIAGNOSIS — B351 Tinea unguium: Secondary | ICD-10-CM | POA: Diagnosis not present

## 2017-04-02 DIAGNOSIS — M79676 Pain in unspecified toe(s): Secondary | ICD-10-CM | POA: Diagnosis not present

## 2017-06-03 ENCOUNTER — Other Ambulatory Visit: Payer: Self-pay | Admitting: Nurse Practitioner

## 2017-06-03 DIAGNOSIS — M1990 Unspecified osteoarthritis, unspecified site: Secondary | ICD-10-CM

## 2017-06-03 NOTE — Telephone Encounter (Signed)
Last seen 03/20/17  MMM

## 2017-06-11 DIAGNOSIS — M79676 Pain in unspecified toe(s): Secondary | ICD-10-CM | POA: Diagnosis not present

## 2017-06-11 DIAGNOSIS — E1151 Type 2 diabetes mellitus with diabetic peripheral angiopathy without gangrene: Secondary | ICD-10-CM | POA: Diagnosis not present

## 2017-06-11 DIAGNOSIS — B351 Tinea unguium: Secondary | ICD-10-CM | POA: Diagnosis not present

## 2017-06-11 DIAGNOSIS — L84 Corns and callosities: Secondary | ICD-10-CM | POA: Diagnosis not present

## 2017-06-13 DIAGNOSIS — Z23 Encounter for immunization: Secondary | ICD-10-CM | POA: Diagnosis not present

## 2017-07-08 ENCOUNTER — Telehealth: Payer: Self-pay | Admitting: Nurse Practitioner

## 2017-07-08 NOTE — Telephone Encounter (Signed)
Returned patient's phone call. Patient states that she only has 2 hyrdrocodone tablets left and can not get in to see Pcp until 12/10 patient would like to know if she can get enough until appt date.

## 2017-07-09 ENCOUNTER — Telehealth: Payer: Self-pay | Admitting: Nurse Practitioner

## 2017-07-09 ENCOUNTER — Other Ambulatory Visit: Payer: Self-pay | Admitting: Nurse Practitioner

## 2017-07-09 NOTE — Telephone Encounter (Signed)
Patient knows she has to be sen to get pain medication

## 2017-07-09 NOTE — Telephone Encounter (Signed)
Please review and advise.

## 2017-07-10 ENCOUNTER — Ambulatory Visit (INDEPENDENT_AMBULATORY_CARE_PROVIDER_SITE_OTHER): Payer: Medicare Other | Admitting: Nurse Practitioner

## 2017-07-10 ENCOUNTER — Encounter: Payer: Self-pay | Admitting: Nurse Practitioner

## 2017-07-10 VITALS — BP 122/56 | HR 74 | Temp 97.7°F

## 2017-07-10 DIAGNOSIS — Z79891 Long term (current) use of opiate analgesic: Secondary | ICD-10-CM

## 2017-07-10 DIAGNOSIS — E1165 Type 2 diabetes mellitus with hyperglycemia: Secondary | ICD-10-CM | POA: Diagnosis not present

## 2017-07-10 DIAGNOSIS — E782 Mixed hyperlipidemia: Secondary | ICD-10-CM | POA: Diagnosis not present

## 2017-07-10 DIAGNOSIS — M1990 Unspecified osteoarthritis, unspecified site: Secondary | ICD-10-CM | POA: Diagnosis not present

## 2017-07-10 DIAGNOSIS — R5381 Other malaise: Secondary | ICD-10-CM

## 2017-07-10 DIAGNOSIS — I4891 Unspecified atrial fibrillation: Secondary | ICD-10-CM | POA: Diagnosis not present

## 2017-07-10 DIAGNOSIS — M810 Age-related osteoporosis without current pathological fracture: Secondary | ICD-10-CM

## 2017-07-10 DIAGNOSIS — G894 Chronic pain syndrome: Secondary | ICD-10-CM | POA: Diagnosis not present

## 2017-07-10 DIAGNOSIS — E118 Type 2 diabetes mellitus with unspecified complications: Secondary | ICD-10-CM

## 2017-07-10 DIAGNOSIS — I10 Essential (primary) hypertension: Secondary | ICD-10-CM

## 2017-07-10 LAB — BAYER DCA HB A1C WAIVED: HB A1C: 6.7 % (ref <7.0)

## 2017-07-10 MED ORDER — HYDROCODONE-ACETAMINOPHEN 10-325 MG PO TABS: 1.0000 | ORAL_TABLET | Freq: Two times a day (BID) | ORAL | 0 refills | Status: DC | PRN

## 2017-07-10 MED ORDER — GLIMEPIRIDE 2 MG PO TABS: 2.0000 mg | ORAL_TABLET | Freq: Every day | ORAL | 1 refills | Status: AC

## 2017-07-10 MED ORDER — HYDROCODONE-ACETAMINOPHEN 10-325 MG PO TABS: 1.0000 | ORAL_TABLET | Freq: Two times a day (BID) | ORAL | 0 refills | Status: DC

## 2017-07-10 MED ORDER — METOPROLOL TARTRATE 25 MG PO TABS: 25.0000 mg | ORAL_TABLET | Freq: Two times a day (BID) | ORAL | 1 refills | Status: DC

## 2017-07-10 MED ORDER — RIVAROXABAN 20 MG PO TABS: ORAL_TABLET | ORAL | 1 refills | Status: DC

## 2017-07-10 MED ORDER — METFORMIN HCL 500 MG PO TABS: 500.0000 mg | ORAL_TABLET | Freq: Every day | ORAL | 1 refills | Status: DC

## 2017-07-10 NOTE — Telephone Encounter (Signed)
Apt made today with MMM to get refill of pain med

## 2017-07-10 NOTE — Progress Notes (Signed)
Subjective:    Patient ID: Elizabeth Drake, female    DOB: Nov 02, 1940, 76 y.o.   MRN: [id number]  HPI  Elizabeth Drake is here today for follow up of chronic medical problem.  Outpatient Encounter Medications as of 07/10/2017  Medication Sig  . acetaminophen (TYLENOL) 500 MG tablet Take 2 tablets (1,000 mg total) by mouth every 8 (eight) hours.  . calcium-vitamin D (OSCAL 500/200 D-3) 500-200 MG-UNIT per tablet Take 1 tablet by mouth daily with breakfast.  . feeding supplement (BOOST / RESOURCE BREEZE) LIQD Take 1 Container by mouth 2 (two) times daily between meals.  Marland Kitchen glimepiride (AMARYL) 2 MG tablet Take 1 tablet (2 mg total) by mouth daily.  Marland Kitchen HYDROcodone-acetaminophen (NORCO) 10-325 MG tablet Take 1 tablet by mouth 2 (two) times daily.  Marland Kitchen HYDROcodone-acetaminophen (NORCO) 10-325 MG tablet Take 1 tablet by mouth 2 (two) times daily.  Marland Kitchen HYDROcodone-acetaminophen (NORCO) 10-325 MG tablet Take 1 tablet by mouth 2 (two) times daily as needed.  . meclizine (ANTIVERT) 25 MG tablet Take 1 tablet (25 mg total) by mouth 3 (three) times daily as needed for dizziness.  . metFORMIN (GLUCOPHAGE) 500 MG tablet Take 1 tablet (500 mg total) by mouth daily.  . metoprolol tartrate (LOPRESSOR) 25 MG tablet Take 1 tablet (25 mg total) by mouth 2 (two) times daily.  . mometasone (NASONEX) 50 MCG/ACT nasal spray 2 sprays per nostril qd (Patient taking differently: Place 2 sprays into the nose daily as needed (allergies). 2 sprays per nostril qd)  . polyethylene glycol (MIRALAX / GLYCOLAX) packet Take 17 g by mouth daily as needed.  . predniSONE (DELTASONE) 5 MG tablet TAKE 1 TABLET (5 MG TOTAL) BY MOUTH DAILY WITH BREAKFAST.  . rivaroxaban (XARELTO) 20 MG TABS tablet TAKE ONE TABLET BY MOUTH ONE TIME DAILY WITH SUPPER  . senna (SENOKOT) 8.6 MG TABS tablet Take 2 tablets (17.2 mg total) by mouth at bedtime.   No facility-administered encounter medications on file as of 07/10/2017.     1. Essential hypertension    No c/o chest pain, sob o headache. Does not check blod pressure at home. BP Readings from Last 3 Encounters:  03/20/17 138/74  01/01/17 128/71  12/07/16 139/73     2. Atrial fibrillation with RVR (HCC)  denies palpitations. She is on daily asa  3. Type 2 diabetes mellitus with hyperglycemia, without long-term current use of insulin ([name]) last hgba1c 7.0%. Does not check blood sugars at home  4. Age-related osteoporosis without current pathological fracture  Is wheel chair bound and cannot doing any weight bearing execises  5. Osteoarthritis, unspecified osteoarthritis type, unspecified site  Hurts all over in joints- but does not move around alot  6. Mixed hyperlipidemia  Does not watch diet  7. Physical deconditioning  Patient has a very poor appetite  8. 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 - none  Pain contract signed on: 06/15/16  New complaints: none  Social history: Lives with husband and son   Review of Systems  Constitutional: Negative for activity change and appetite change.  HENT: Negative.   Eyes: Negative for pain.  Respiratory: Negative for shortness of breath.   Cardiovascular: Negative for chest pain, palpitations and leg swelling.  Gastrointestinal: Negative for abdominal pain.  Endocrine: Negative for polydipsia.  Genitourinary: Negative.    Musculoskeletal: Positive for arthralgias and back pain.  Skin: Negative for rash.  Neurological: Negative for dizziness, weakness and headaches.  Hematological: Does not bruise/bleed easily.  Psychiatric/Behavioral: Negative.   All other systems reviewed and are negative.      Objective:   Physical Exam  Constitutional: She is oriented to person, place, and time. She appears well-developed and well-nourished.  HENT:  Nose: Nose normal.  Mouth/Throat: Oropharynx is clear and moist.  Eyes: EOM are normal.  Neck: Trachea normal, normal range of motion and full passive range of motion without pain. Neck supple. No JVD present. Carotid bruit is not present. No thyromegaly present.  Cardiovascular: Normal rate, regular rhythm, normal heart sounds and intact distal pulses. Exam reveals no gallop and no friction rub.  No murmur heard. Pulmonary/Chest: Effort normal and breath sounds normal.  Abdominal: Soft. Bowel sounds are normal. She exhibits no distension and no mass. There is no tenderness.  Musculoskeletal: Normal range of motion.  Wheel chair restricted  Lymphadenopathy:    She has no cervical adenopathy.  Neurological: She is alert and oriented to person, place, and time. She has normal reflexes.  Skin: Skin is warm and dry.  Psychiatric: She has a normal mood and affect. Her behavior is normal. Judgment and thought content normal.   BP (!) 122/56   Pulse 74   Temp 97.7 F (36.5 C) (Oral)         Assessment & Plan:  1. Essential hypertension Low sodium diet - CMP14+EGFR - metoprolol tartrate (LOPRESSOR) 25 MG tablet; Take 1 tablet (25 mg total) by mouth 2 (two) times daily.  Dispense: 180 tablet; Refill: 1  2. Atrial fibrillation with RVR (HCC\ Avoid caffeine - rivaroxaban (XARELTO) 20 MG TABS tablet; TAKE ONE TABLET BY MOUTH ONE TIME DAILY WITH SUPPER  Dispense: 90 tablet; Refill: 1  3. Type 2 diabetes mellitus with hyperglycemia, without long-term current use of  insulin (HCC) Continue to watchcarbs in diet - Bayer DCA Hb A1c Waived  4. Age-related osteoporosis without current pathological fracture  5. Osteoarthritis, unspecified osteoarthritis type, unspecified site  6. Mixed hyperlipidemia Low fat diet - Lipid panel  7. Physical deconditioning Eat more protein  8. Encounter for long-term use of opiate analgesic  9. Chronic pain syndrome (LEGS/BACK) - HYDROcodone-acetaminophen (NORCO) 10-325 MG tablet; Take 1 tablet by mouth 2 (two) times daily.  Dispense: 60 tablet; Refill: 0 - HYDROcodone-acetaminophen (NORCO) 10-325 MG tablet; Take 1 tablet by mouth 2 (two) times daily.  Dispense: 60 tablet; Refill: 0 - HYDROcodone-acetaminophen (NORCO) 10-325 MG tablet; Take 1 tablet by mouth 2 (two) times daily as needed.  Dispense: 30 tablet; Refill: 0  10. Type 2 diabetes mellitus with complication, without long-term current use of insulin (HCC) Again watch carbs in diet - metFORMIN (GLUCOPHAGE) 500 MG tablet; Take 1 tablet (500 mg total) by mouth daily.  Dispense: 90 tablet; Refill: 1 - glimepiride (AMARYL) 2 MG tablet; Take 1 tablet (2 mg total) by mouth daily.  Dispense: 90 tablet; Refill: 1    Labs pending Health maintenance reviewed Diet and exercise encouraged Continue all  meds Follow up  In 3 months   [place], FNP

## 2017-07-10 NOTE — Patient Instructions (Signed)

## 2017-07-11 LAB — CMP14+EGFR
ALBUMIN: 3.6 g/dL (ref 3.5–4.8)
ALK PHOS: 61 IU/L (ref 39–117)
ALT: 5 IU/L (ref 0–32)
AST: 11 IU/L (ref 0–40)
Albumin/Globulin Ratio: 0.8 — ABNORMAL LOW (ref 1.2–2.2)
BUN / CREAT RATIO: 25 (ref 12–28)
BUN: 17 mg/dL (ref 8–27)
Bilirubin Total: 0.4 mg/dL (ref 0.0–1.2)
CALCIUM: 9.4 mg/dL (ref 8.7–10.3)
CO2: 27 mmol/L (ref 20–29)
CREATININE: 0.69 mg/dL (ref 0.57–1.00)
Chloride: 101 mmol/L (ref 96–106)
GFR, EST AFRICAN AMERICAN: 97 mL/min/{1.73_m2} (ref >59)
GFR, EST NON AFRICAN AMERICAN: 84 mL/min/{1.73_m2} (ref >59)
GLOBULIN, TOTAL: 4.3 g/dL (ref 1.5–4.5)
Glucose: 159 mg/dL — ABNORMAL HIGH (ref 65–99)
Potassium: 3.5 mmol/L (ref 3.5–5.2)
SODIUM: 137 mmol/L (ref 134–144)
TOTAL PROTEIN: 7.9 g/dL (ref 6.0–8.5)

## 2017-07-11 LAB — LIPID PANEL
CHOL/HDL RATIO: 4.3 ratio (ref 0.0–4.4)
Cholesterol, Total: 169 mg/dL (ref 100–199)
HDL: 39 mg/dL — ABNORMAL LOW (ref >39)
LDL CALC: 93 mg/dL (ref 0–99)
Triglycerides: 186 mg/dL — ABNORMAL HIGH (ref 0–149)
VLDL CHOLESTEROL CAL: 37 mg/dL (ref 5–40)

## 2017-07-15 ENCOUNTER — Ambulatory Visit: Payer: Medicare Other | Admitting: Nurse Practitioner

## 2017-08-01 NOTE — Telephone Encounter (Signed)
Patient was seen 07/10/17.

## 2017-08-20 DIAGNOSIS — E1142 Type 2 diabetes mellitus with diabetic polyneuropathy: Secondary | ICD-10-CM | POA: Diagnosis not present

## 2017-08-20 DIAGNOSIS — M79676 Pain in unspecified toe(s): Secondary | ICD-10-CM | POA: Diagnosis not present

## 2017-08-20 DIAGNOSIS — L84 Corns and callosities: Secondary | ICD-10-CM | POA: Diagnosis not present

## 2017-08-20 DIAGNOSIS — B351 Tinea unguium: Secondary | ICD-10-CM | POA: Diagnosis not present

## 2017-09-26 ENCOUNTER — Other Ambulatory Visit: Payer: Self-pay | Admitting: Nurse Practitioner

## 2017-09-26 DIAGNOSIS — M1990 Unspecified osteoarthritis, unspecified site: Secondary | ICD-10-CM

## 2017-10-10 ENCOUNTER — Other Ambulatory Visit: Payer: Self-pay | Admitting: Nurse Practitioner

## 2017-10-10 DIAGNOSIS — G894 Chronic pain syndrome: Secondary | ICD-10-CM

## 2017-10-10 MED ORDER — HYDROCODONE-ACETAMINOPHEN 10-325 MG PO TABS: 1.0000 | ORAL_TABLET | Freq: Two times a day (BID) | ORAL | 0 refills | Status: DC | PRN

## 2017-10-14 ENCOUNTER — Ambulatory Visit (INDEPENDENT_AMBULATORY_CARE_PROVIDER_SITE_OTHER): Payer: Medicare Other | Admitting: Nurse Practitioner

## 2017-10-14 ENCOUNTER — Encounter: Payer: Self-pay | Admitting: Nurse Practitioner

## 2017-10-14 VITALS — BP 144/74 | HR 70 | Temp 97.2°F

## 2017-10-14 DIAGNOSIS — G894 Chronic pain syndrome: Secondary | ICD-10-CM

## 2017-10-14 DIAGNOSIS — E1165 Type 2 diabetes mellitus with hyperglycemia: Secondary | ICD-10-CM

## 2017-10-14 DIAGNOSIS — M1990 Unspecified osteoarthritis, unspecified site: Secondary | ICD-10-CM

## 2017-10-14 DIAGNOSIS — E782 Mixed hyperlipidemia: Secondary | ICD-10-CM

## 2017-10-14 DIAGNOSIS — Z993 Dependence on wheelchair: Secondary | ICD-10-CM

## 2017-10-14 DIAGNOSIS — I10 Essential (primary) hypertension: Secondary | ICD-10-CM | POA: Diagnosis not present

## 2017-10-14 DIAGNOSIS — I4891 Unspecified atrial fibrillation: Secondary | ICD-10-CM | POA: Diagnosis not present

## 2017-10-14 DIAGNOSIS — S72002A Fracture of unspecified part of neck of left femur, initial encounter for closed fracture: Secondary | ICD-10-CM

## 2017-10-14 DIAGNOSIS — E43 Unspecified severe protein-calorie malnutrition: Secondary | ICD-10-CM | POA: Diagnosis not present

## 2017-10-14 LAB — BAYER DCA HB A1C WAIVED: HB A1C (BAYER DCA - WAIVED): 6.6 % (ref <7.0)

## 2017-10-14 MED ORDER — HYDROCODONE-ACETAMINOPHEN 10-325 MG PO TABS: 1.0000 | ORAL_TABLET | Freq: Two times a day (BID) | ORAL | 0 refills | Status: DC | PRN

## 2017-10-14 MED ORDER — RIVAROXABAN 20 MG PO TABS: ORAL_TABLET | ORAL | 1 refills | Status: DC

## 2017-10-14 MED ORDER — HYDROCODONE-ACETAMINOPHEN 10-325 MG PO TABS: 1.0000 | ORAL_TABLET | Freq: Two times a day (BID) | ORAL | 0 refills | Status: DC

## 2017-10-14 MED ORDER — PREDNISONE 5 MG PO TABS: ORAL_TABLET | ORAL | 1 refills | Status: AC

## 2017-10-14 MED ORDER — METOPROLOL TARTRATE 25 MG PO TABS: 25.0000 mg | ORAL_TABLET | Freq: Two times a day (BID) | ORAL | 1 refills | Status: AC

## 2017-10-14 NOTE — Patient Instructions (Signed)

## 2017-10-14 NOTE — Progress Notes (Signed)
Subjective:    Patient ID: Elizabeth Drake, female    DOB: [birth year], 77 y.o.   MRN: [id number]  HPI  TYREISHA UNGAR is here today for follow up of chronic medical problem.  Outpatient Encounter Medications as of 10/14/2017  Medication Sig  . acetaminophen (TYLENOL) 500 MG tablet Take 2 tablets (1,000 mg total) by mouth every 8 (eight) hours.  . calcium-vitamin D (OSCAL 500/200 D-3) 500-200 MG-UNIT per tablet Take 1 tablet by mouth daily with breakfast.  . feeding supplement (BOOST / RESOURCE BREEZE) LIQD Take 1 Container by mouth 2 (two) times daily between meals.  Marland Kitchen glimepiride (AMARYL) 2 MG tablet Take 1 tablet (2 mg total) by mouth daily.  Marland Kitchen HYDROcodone-acetaminophen (NORCO) 10-325 MG tablet Take 1 tablet by mouth 2 (two) times daily.  Marland Kitchen HYDROcodone-acetaminophen (NORCO) 10-325 MG tablet Take 1 tablet by mouth 2 (two) times daily.  Marland Kitchen HYDROcodone-acetaminophen (NORCO) 10-325 MG tablet Take 1 tablet by mouth 2 (two) times daily as needed.  . meclizine (ANTIVERT) 25 MG tablet Take 1 tablet (25 mg total) by mouth 3 (three) times daily as needed for dizziness.  . metFORMIN (GLUCOPHAGE) 500 MG tablet Take 1 tablet (500 mg total) by mouth daily.  . metoprolol tartrate (LOPRESSOR) 25 MG tablet Take 1 tablet (25 mg total) by mouth 2 (two) times daily.  . mometasone (NASONEX) 50 MCG/ACT nasal spray 2 sprays per nostril qd (Patient taking differently: Place 2 sprays into the nose daily as needed (allergies). 2 sprays per nostril qd)  . polyethylene glycol (MIRALAX / GLYCOLAX) packet Take 17 g by mouth daily as needed.  . predniSONE (DELTASONE) 5 MG tablet TAKE 1 TABLET BY MOUTH EVERY DAY WITH BREAKFAST  . rivaroxaban (XARELTO) 20 MG TABS tablet TAKE ONE TABLET BY MOUTH ONE TIME DAILY WITH SUPPER  . senna (SENOKOT) 8.6 MG TABS tablet Take 2 tablets (17.2 mg total) by mouth at bedtime.     1. Atrial fibrillation with RVR ([place])  She denies any heart palpitations- is on xarelto daily  2. Essential  hypertension  No c/o chest ain, sob or headache. Does not check blood pressure at home. BP Readings from Last 3 Encounters:  10/14/17 (!) 144/74  07/10/17 (!) 122/56  03/20/17 138/74     3. Type 2 diabetes mellitus with hyperglycemia, without long-term current use of insulin (HCC) last hgba1c was 6.7%. She does not check blood sugars at home  4. Closed fracture of left hip, initial encounter [facility])  She has not been able to get around much since she broke her hip. She stays in wheel chair most of the time.  5. Chronic pain syndrome (LEGS/BACK)  She has chronic pain in back and legs and takes norco 10/35 1 daily. She say sit helps bring pain down to 2/10 most days.  6. Mixed hyperlipidemia  Has a very poor appetite and diet  7. Protein-calorie malnutrition, severe  She just will not eat well  8. Wheelchair bound  She sits on a pillow and denies any pain or sore lesions.    New complaints: None today  Social history: Her son that lives with her just had BKA of right leg    Review of Systems  Constitutional: Negative for activity change and appetite change.  HENT: Negative.   Eyes: Negative for pain.  Respiratory: Negative for shortness of breath.   Cardiovascular: Negative for chest pain, palpitations and leg swelling.  Gastrointestinal: Negative for abdominal pain.  Endocrine: Negative for polydipsia.  Genitourinary: Negative.   Musculoskeletal: Positive for gait problem.  Skin: Negative for rash.  Neurological: Negative for dizziness, weakness and headaches.  Hematological: Does not bruise/bleed easily.  Psychiatric/Behavioral: Negative.   All other systems reviewed and are negative.      Objective:   Physical Exam  Constitutional: She is oriented to person, place, and time. She appears well-developed and well-nourished.  HENT:  Nose: Nose normal.  Mouth/Throat: Oropharynx is clear and moist.  Eyes: EOM are normal.  Neck: Trachea normal, normal range of motion and  full passive range of motion without pain. Neck supple. No JVD present. Carotid bruit is not present. No thyromegaly present.  Cardiovascular: Normal rate, regular rhythm, normal heart sounds and intact distal pulses. Exam reveals no gallop and no friction rub.  No murmur heard. Pulmonary/Chest: Effort normal and breath sounds normal.  Abdominal: Soft. Bowel sounds are normal. She exhibits no distension and no mass. There is no tenderness.  Musculoskeletal: Normal range of motion.  Wheel chair bound  Lymphadenopathy:    She has no cervical adenopathy.  Neurological: She is alert and oriented to person, place, and time. She has normal reflexes.  Skin: Skin is warm and dry.  Psychiatric: She has a normal mood and affect. Her behavior is normal. Judgment and thought content normal.   BP (!) 144/74   Pulse 70   Temp (!) 97.2 F (36.2 C) (Oral)  hgba1c 6.6%     Assessment & Plan:  1. Atrial fibrillation with RVR (HCC) Avoid caffeine - rivaroxaban (XARELTO) 20 MG TABS tablet; TAKE ONE TABLET BY MOUTH ONE TIME DAILY WITH SUPPER  Dispense: 90 tablet; Refill: 1  2. Essential hypertension Low sodium diet - CMP14+EGFR - metoprolol tartrate (LOPRESSOR) 25 MG tablet; Take 1 tablet (25 mg total) by mouth 2 (two) times daily.  Dispense: 180 tablet; Refill: 1  3. Type 2 diabetes mellitus with hyperglycemia, without long-term current use of insulin (HCC) Watch carbs in diet - Bayer DCA Hb A1c Waived  4. Closed fracture of left hip, initial encounter ([place])  5. Chronic pain syndrome (LEGS/BACK) rest - HYDROcodone-acetaminophen (NORCO) 10-325 MG tablet; Take 1 tablet by mouth 2 (two) times daily as needed.  Dispense: 60 tablet; Refill: 0 - HYDROcodone-acetaminophen (NORCO) 10-325 MG tablet; Take 1 tablet by mouth 2 (two) times daily.  Dispense: 60 tablet; Refill: 0 - HYDROcodone-acetaminophen (NORCO) 10-325 MG tablet; Take 1 tablet by mouth 2 (two) times daily.  Dispense: 60 tablet; Refill:  0  6. Mixed hyperlipidemia Low fat diet - Lipid panel  7. Protein-calorie malnutrition, severe Need to increase protein in diet  8. Wheelchair bound  9. Osteoarthritis, unspecified osteoarthritis type, unspecified site - predniSONE (DELTASONE) 5 MG tablet; TAKE 1 TABLET BY MOUTH EVERY DAY WITH BREAKFAST  Dispense: 90 tablet; Refill: 1    Labs pending Health maintenance reviewed Diet and exercise encouraged Continue all meds Follow up  In 3 months   [place], FNP

## 2017-10-15 LAB — CMP14+EGFR
ALT: 8 IU/L (ref 0–32)
AST: 14 IU/L (ref 0–40)
Albumin/Globulin Ratio: 0.8 — ABNORMAL LOW (ref 1.2–2.2)
Albumin: 3.6 g/dL (ref 3.5–4.8)
Alkaline Phosphatase: 81 IU/L (ref 39–117)
BUN/Creatinine Ratio: 19 (ref 12–28)
BUN: 13 mg/dL (ref 8–27)
Bilirubin Total: 0.3 mg/dL (ref 0.0–1.2)
CALCIUM: 8.9 mg/dL (ref 8.7–10.3)
CO2: 26 mmol/L (ref 20–29)
CREATININE: 0.69 mg/dL (ref 0.57–1.00)
Chloride: 103 mmol/L (ref 96–106)
GFR calc Af Amer: 96 mL/min/{1.73_m2} (ref >59)
GFR, EST NON AFRICAN AMERICAN: 84 mL/min/{1.73_m2} (ref >59)
Globulin, Total: 4.4 g/dL (ref 1.5–4.5)
Glucose: 153 mg/dL — ABNORMAL HIGH (ref 65–99)
Potassium: 3.9 mmol/L (ref 3.5–5.2)
Sodium: 142 mmol/L (ref 134–144)
Total Protein: 8 g/dL (ref 6.0–8.5)

## 2017-10-15 LAB — LIPID PANEL
CHOL/HDL RATIO: 4.3 ratio (ref 0.0–4.4)
Cholesterol, Total: 176 mg/dL (ref 100–199)
HDL: 41 mg/dL (ref >39)
LDL CALC: 88 mg/dL (ref 0–99)
TRIGLYCERIDES: 237 mg/dL — AB (ref 0–149)
VLDL Cholesterol Cal: 47 mg/dL — ABNORMAL HIGH (ref 5–40)

## 2017-11-05 DIAGNOSIS — M79676 Pain in unspecified toe(s): Secondary | ICD-10-CM | POA: Diagnosis not present

## 2017-11-05 DIAGNOSIS — B351 Tinea unguium: Secondary | ICD-10-CM | POA: Diagnosis not present

## 2017-11-05 DIAGNOSIS — L84 Corns and callosities: Secondary | ICD-10-CM | POA: Diagnosis not present

## 2017-11-05 DIAGNOSIS — E1142 Type 2 diabetes mellitus with diabetic polyneuropathy: Secondary | ICD-10-CM | POA: Diagnosis not present

## 2017-11-18 ENCOUNTER — Telehealth: Payer: Self-pay | Admitting: Nurse Practitioner

## 2017-11-18 NOTE — Telephone Encounter (Signed)
Can med be refilled early?

## 2017-11-19 NOTE — Telephone Encounter (Signed)
Called pharmacy to check fill date and it was filled on 10/24/17 for #[id number]. Pharmacy states they cannot fill till the 18th.

## 2017-11-19 NOTE — Telephone Encounter (Signed)
She can only get filled every 30 days. If she has ran out it is because she has taken more then prescribed. There is nothing I can do. They will not let her have it until it has been 30 days.

## 2017-11-20 NOTE — Telephone Encounter (Signed)
Aware. 

## 2017-11-23 ENCOUNTER — Emergency Department (HOSPITAL_COMMUNITY): Payer: Medicare Other

## 2017-11-23 ENCOUNTER — Inpatient Hospital Stay (HOSPITAL_COMMUNITY)
Admission: EM | Admit: 2017-11-23 | Discharge: 2017-11-26 | DRG: 064 | Disposition: A | Discharge status: Skilled Nursing Facility | Payer: Medicare Other | Attending: Family Medicine | Admitting: Family Medicine

## 2017-11-23 ENCOUNTER — Other Ambulatory Visit: Payer: Self-pay

## 2017-11-23 ENCOUNTER — Encounter (HOSPITAL_COMMUNITY): Payer: Self-pay | Admitting: Emergency Medicine

## 2017-11-23 DIAGNOSIS — Z9181 History of falling: Secondary | ICD-10-CM | POA: Diagnosis not present

## 2017-11-23 DIAGNOSIS — R1310 Dysphagia, unspecified: Secondary | ICD-10-CM | POA: Diagnosis present

## 2017-11-23 DIAGNOSIS — Z7952 Long term (current) use of systemic steroids: Secondary | ICD-10-CM

## 2017-11-23 DIAGNOSIS — T50904A Poisoning by unspecified drugs, medicaments and biological substances, undetermined, initial encounter: Secondary | ICD-10-CM | POA: Diagnosis not present

## 2017-11-23 DIAGNOSIS — Z79899 Other long term (current) drug therapy: Secondary | ICD-10-CM

## 2017-11-23 DIAGNOSIS — R4182 Altered mental status, unspecified: Secondary | ICD-10-CM | POA: Diagnosis not present

## 2017-11-23 DIAGNOSIS — J181 Lobar pneumonia, unspecified organism: Secondary | ICD-10-CM | POA: Diagnosis present

## 2017-11-23 DIAGNOSIS — I6389 Other cerebral infarction: Secondary | ICD-10-CM | POA: Diagnosis not present

## 2017-11-23 DIAGNOSIS — Z7984 Long term (current) use of oral hypoglycemic drugs: Secondary | ICD-10-CM

## 2017-11-23 DIAGNOSIS — I639 Cerebral infarction, unspecified: Secondary | ICD-10-CM | POA: Diagnosis not present

## 2017-11-23 DIAGNOSIS — Z872 Personal history of diseases of the skin and subcutaneous tissue: Secondary | ICD-10-CM | POA: Diagnosis not present

## 2017-11-23 DIAGNOSIS — R270 Ataxia, unspecified: Secondary | ICD-10-CM | POA: Diagnosis present

## 2017-11-23 DIAGNOSIS — M6281 Muscle weakness (generalized): Secondary | ICD-10-CM | POA: Diagnosis not present

## 2017-11-23 DIAGNOSIS — M810 Age-related osteoporosis without current pathological fracture: Secondary | ICD-10-CM | POA: Diagnosis present

## 2017-11-23 DIAGNOSIS — R401 Stupor: Secondary | ICD-10-CM | POA: Diagnosis not present

## 2017-11-23 DIAGNOSIS — E1165 Type 2 diabetes mellitus with hyperglycemia: Secondary | ICD-10-CM

## 2017-11-23 DIAGNOSIS — Z86711 Personal history of pulmonary embolism: Secondary | ICD-10-CM | POA: Diagnosis present

## 2017-11-23 DIAGNOSIS — Z993 Dependence on wheelchair: Secondary | ICD-10-CM

## 2017-11-23 DIAGNOSIS — E876 Hypokalemia: Secondary | ICD-10-CM | POA: Diagnosis present

## 2017-11-23 DIAGNOSIS — E1159 Type 2 diabetes mellitus with other circulatory complications: Secondary | ICD-10-CM | POA: Diagnosis not present

## 2017-11-23 DIAGNOSIS — I10 Essential (primary) hypertension: Secondary | ICD-10-CM | POA: Diagnosis not present

## 2017-11-23 DIAGNOSIS — Z7901 Long term (current) use of anticoagulants: Secondary | ICD-10-CM

## 2017-11-23 DIAGNOSIS — I6340 Cerebral infarction due to embolism of unspecified cerebral artery: Secondary | ICD-10-CM | POA: Diagnosis not present

## 2017-11-23 DIAGNOSIS — J9601 Acute respiratory failure with hypoxia: Secondary | ICD-10-CM | POA: Diagnosis present

## 2017-11-23 DIAGNOSIS — I351 Nonrheumatic aortic (valve) insufficiency: Secondary | ICD-10-CM | POA: Diagnosis not present

## 2017-11-23 DIAGNOSIS — J189 Pneumonia, unspecified organism: Secondary | ICD-10-CM | POA: Diagnosis present

## 2017-11-23 DIAGNOSIS — Z79891 Long term (current) use of opiate analgesic: Secondary | ICD-10-CM

## 2017-11-23 DIAGNOSIS — Z90710 Acquired absence of both cervix and uterus: Secondary | ICD-10-CM | POA: Diagnosis not present

## 2017-11-23 DIAGNOSIS — E43 Unspecified severe protein-calorie malnutrition: Secondary | ICD-10-CM | POA: Diagnosis not present

## 2017-11-23 DIAGNOSIS — E119 Type 2 diabetes mellitus without complications: Secondary | ICD-10-CM

## 2017-11-23 DIAGNOSIS — M1990 Unspecified osteoarthritis, unspecified site: Secondary | ICD-10-CM | POA: Diagnosis present

## 2017-11-23 DIAGNOSIS — Z888 Allergy status to other drugs, medicaments and biological substances status: Secondary | ICD-10-CM

## 2017-11-23 DIAGNOSIS — E785 Hyperlipidemia, unspecified: Secondary | ICD-10-CM | POA: Diagnosis not present

## 2017-11-23 DIAGNOSIS — J984 Other disorders of lung: Secondary | ICD-10-CM | POA: Diagnosis not present

## 2017-11-23 DIAGNOSIS — Z886 Allergy status to analgesic agent status: Secondary | ICD-10-CM

## 2017-11-23 DIAGNOSIS — N390 Urinary tract infection, site not specified: Secondary | ICD-10-CM

## 2017-11-23 DIAGNOSIS — G894 Chronic pain syndrome: Secondary | ICD-10-CM | POA: Diagnosis present

## 2017-11-23 DIAGNOSIS — I480 Paroxysmal atrial fibrillation: Secondary | ICD-10-CM | POA: Diagnosis not present

## 2017-11-23 DIAGNOSIS — F1190 Opioid use, unspecified, uncomplicated: Secondary | ICD-10-CM

## 2017-11-23 DIAGNOSIS — I4891 Unspecified atrial fibrillation: Secondary | ICD-10-CM

## 2017-11-23 DIAGNOSIS — I63441 Cerebral infarction due to embolism of right cerebellar artery: Secondary | ICD-10-CM | POA: Diagnosis not present

## 2017-11-23 DIAGNOSIS — Z9114 Patient's other noncompliance with medication regimen: Secondary | ICD-10-CM

## 2017-11-23 DIAGNOSIS — Z881 Allergy status to other antibiotic agents status: Secondary | ICD-10-CM

## 2017-11-23 DIAGNOSIS — N3281 Overactive bladder: Secondary | ICD-10-CM | POA: Diagnosis not present

## 2017-11-23 DIAGNOSIS — I6782 Cerebral ischemia: Secondary | ICD-10-CM | POA: Diagnosis not present

## 2017-11-23 DIAGNOSIS — B9620 Unspecified Escherichia coli [E. coli] as the cause of diseases classified elsewhere: Secondary | ICD-10-CM | POA: Diagnosis present

## 2017-11-23 DIAGNOSIS — T402X1A Poisoning by other opioids, accidental (unintentional), initial encounter: Secondary | ICD-10-CM | POA: Diagnosis present

## 2017-11-23 DIAGNOSIS — Z8249 Family history of ischemic heart disease and other diseases of the circulatory system: Secondary | ICD-10-CM

## 2017-11-23 DIAGNOSIS — D869 Sarcoidosis, unspecified: Secondary | ICD-10-CM | POA: Diagnosis not present

## 2017-11-23 DIAGNOSIS — R278 Other lack of coordination: Secondary | ICD-10-CM | POA: Diagnosis not present

## 2017-11-23 DIAGNOSIS — Z86718 Personal history of other venous thrombosis and embolism: Secondary | ICD-10-CM

## 2017-11-23 DIAGNOSIS — L89151 Pressure ulcer of sacral region, stage 1: Secondary | ICD-10-CM | POA: Diagnosis present

## 2017-11-23 DIAGNOSIS — Z885 Allergy status to narcotic agent status: Secondary | ICD-10-CM

## 2017-11-23 DIAGNOSIS — Z66 Do not resuscitate: Secondary | ICD-10-CM | POA: Diagnosis present

## 2017-11-23 DIAGNOSIS — I82409 Acute embolism and thrombosis of unspecified deep veins of unspecified lower extremity: Secondary | ICD-10-CM | POA: Diagnosis not present

## 2017-11-23 DIAGNOSIS — I340 Nonrheumatic mitral (valve) insufficiency: Secondary | ICD-10-CM | POA: Diagnosis not present

## 2017-11-23 DIAGNOSIS — Z87891 Personal history of nicotine dependence: Secondary | ICD-10-CM | POA: Diagnosis not present

## 2017-11-23 DIAGNOSIS — K5732 Diverticulitis of large intestine without perforation or abscess without bleeding: Secondary | ICD-10-CM | POA: Diagnosis not present

## 2017-11-23 DIAGNOSIS — I2699 Other pulmonary embolism without acute cor pulmonale: Secondary | ICD-10-CM | POA: Diagnosis not present

## 2017-11-23 DIAGNOSIS — N3000 Acute cystitis without hematuria: Secondary | ICD-10-CM | POA: Diagnosis not present

## 2017-11-23 DIAGNOSIS — L8990 Pressure ulcer of unspecified site, unspecified stage: Secondary | ICD-10-CM

## 2017-11-23 DIAGNOSIS — M5030 Other cervical disc degeneration, unspecified cervical region: Secondary | ICD-10-CM | POA: Diagnosis not present

## 2017-11-23 DIAGNOSIS — G464 Cerebellar stroke syndrome: Secondary | ICD-10-CM | POA: Diagnosis not present

## 2017-11-23 HISTORY — DX: Other chronic pain: G89.29

## 2017-11-23 HISTORY — DX: Unspecified osteoarthritis, unspecified site: M19.90

## 2017-11-23 HISTORY — DX: Unspecified atrial fibrillation: I48.91

## 2017-11-23 HISTORY — DX: Dependence on wheelchair: Z99.3

## 2017-11-23 HISTORY — DX: Essential (primary) hypertension: I10

## 2017-11-23 HISTORY — DX: Dizziness and giddiness: R42

## 2017-11-23 LAB — BLOOD GAS, ARTERIAL
Acid-Base Excess: 2 mmol/L (ref 0.0–2.0)
BICARBONATE: 26 mmol/L (ref 20.0–28.0)
DRAWN BY: 221791
FIO2: 21
O2 Saturation: 93.3 %
PO2 ART: 71.7 mmHg — AB (ref 83.0–108.0)
pCO2 arterial: 41.1 mmHg (ref 32.0–48.0)
pH, Arterial: 7.418 (ref 7.350–7.450)

## 2017-11-23 LAB — URINALYSIS, ROUTINE W REFLEX MICROSCOPIC
Bilirubin Urine: NEGATIVE
GLUCOSE, UA: 50 mg/dL — AB
KETONES UR: NEGATIVE mg/dL
LEUKOCYTES UA: NEGATIVE
NITRITE: POSITIVE — AB
PH: 6 (ref 5.0–8.0)
PROTEIN: NEGATIVE mg/dL
Specific Gravity, Urine: 1.013 (ref 1.005–1.030)

## 2017-11-23 LAB — PROTIME-INR
INR: 0.99
Prothrombin Time: 13 seconds (ref 11.4–15.2)

## 2017-11-23 LAB — RAPID URINE DRUG SCREEN, HOSP PERFORMED
AMPHETAMINES: NOT DETECTED
Barbiturates: NOT DETECTED
Benzodiazepines: NOT DETECTED
Cocaine: NOT DETECTED
OPIATES: POSITIVE — AB
Tetrahydrocannabinol: NOT DETECTED

## 2017-11-23 LAB — COMPREHENSIVE METABOLIC PANEL
ALT: 11 U/L — AB (ref 14–54)
AST: 19 U/L (ref 15–41)
Albumin: 3.4 g/dL — ABNORMAL LOW (ref 3.5–5.0)
Alkaline Phosphatase: 69 U/L (ref 38–126)
Anion gap: 8 (ref 5–15)
BILIRUBIN TOTAL: 0.4 mg/dL (ref 0.3–1.2)
BUN: 17 mg/dL (ref 6–20)
CALCIUM: 8.7 mg/dL — AB (ref 8.9–10.3)
CHLORIDE: 105 mmol/L (ref 101–111)
CO2: 26 mmol/L (ref 22–32)
CREATININE: 0.62 mg/dL (ref 0.44–1.00)
Glucose, Bld: 172 mg/dL — ABNORMAL HIGH (ref 65–99)
Potassium: 3.6 mmol/L (ref 3.5–5.1)
Sodium: 139 mmol/L (ref 135–145)
TOTAL PROTEIN: 8.4 g/dL — AB (ref 6.5–8.1)

## 2017-11-23 LAB — CBC WITH DIFFERENTIAL/PLATELET
BASOS ABS: 0 10*3/uL (ref 0.0–0.1)
BASOS PCT: 0 %
EOS PCT: 0 %
Eosinophils Absolute: 0 10*3/uL (ref 0.0–0.7)
HEMATOCRIT: 36.7 % (ref 36.0–46.0)
Hemoglobin: 11.5 g/dL — ABNORMAL LOW (ref 12.0–15.0)
LYMPHS PCT: 12 %
Lymphs Abs: 1.5 10*3/uL (ref 0.7–4.0)
MCH: 30.7 pg (ref 26.0–34.0)
MCHC: 31.3 g/dL (ref 30.0–36.0)
MCV: 98.1 fL (ref 78.0–100.0)
Monocytes Absolute: 0.5 10*3/uL (ref 0.1–1.0)
Monocytes Relative: 5 %
NEUTROS ABS: 9.7 10*3/uL — AB (ref 1.7–7.7)
Neutrophils Relative %: 83 %
PLATELETS: 218 10*3/uL (ref 150–400)
RBC: 3.74 MIL/uL — AB (ref 3.87–5.11)
RDW: 12.5 % (ref 11.5–15.5)
WBC: 11.7 10*3/uL — AB (ref 4.0–10.5)

## 2017-11-23 LAB — TROPONIN I: Troponin I: <0.03 ng/mL (ref <0.03)

## 2017-11-23 LAB — CBG MONITORING, ED
GLUCOSE-CAPILLARY: 157 mg/dL — AB (ref 65–99)
GLUCOSE-CAPILLARY: 175 mg/dL — AB (ref 65–99)

## 2017-11-23 LAB — ETHANOL

## 2017-11-23 LAB — LACTIC ACID, PLASMA
LACTIC ACID, VENOUS: 2.1 mmol/L — AB (ref 0.5–1.9)
LACTIC ACID, VENOUS: 3.1 mmol/L — AB (ref 0.5–1.9)

## 2017-11-23 LAB — ACETAMINOPHEN LEVEL

## 2017-11-23 LAB — SALICYLATE LEVEL: Salicylate Lvl: <7 mg/dL (ref 2.8–30.0)

## 2017-11-23 MED ORDER — VANCOMYCIN HCL IN DEXTROSE 1-5 GM/200ML-% IV SOLN
1000.0000 mg | Freq: Once | INTRAVENOUS | Status: AC
  Administered 2017-11-23: 1000 mg via INTRAVENOUS
  Filled 2017-11-23: qty 200

## 2017-11-23 MED ORDER — ONDANSETRON HCL 4 MG PO TABS: 4.0000 mg | ORAL_TABLET | Freq: Four times a day (QID) | ORAL | Status: DC | PRN

## 2017-11-23 MED ORDER — ACETAMINOPHEN 325 MG PO TABS
650.0000 mg | ORAL_TABLET | Freq: Four times a day (QID) | ORAL | Status: DC | PRN
  Administered 2017-11-26: 650 mg via ORAL
  Filled 2017-11-23: qty 2

## 2017-11-23 MED ORDER — SODIUM CHLORIDE 0.9 % IV BOLUS
1000.0000 mL | Freq: Once | INTRAVENOUS | Status: AC
  Administered 2017-11-23: 1000 mL via INTRAVENOUS

## 2017-11-23 MED ORDER — NALOXONE HCL 2 MG/2ML IJ SOSY
1.0000 mg | PREFILLED_SYRINGE | Freq: Once | INTRAMUSCULAR | Status: AC
  Administered 2017-11-23: 1 mg via INTRAVENOUS

## 2017-11-23 MED ORDER — BOOST / RESOURCE BREEZE PO LIQD CUSTOM: 1.0000 | Freq: Two times a day (BID) | ORAL | Status: DC

## 2017-11-23 MED ORDER — ONDANSETRON HCL 4 MG/2ML IJ SOLN: 4.0000 mg | Freq: Four times a day (QID) | INTRAMUSCULAR | Status: DC | PRN

## 2017-11-23 MED ORDER — BISACODYL 10 MG RE SUPP: 10.0000 mg | Freq: Every day | RECTAL | Status: DC | PRN

## 2017-11-23 MED ORDER — INSULIN ASPART 100 UNIT/ML ~~LOC~~ SOLN
0.0000 [IU] | SUBCUTANEOUS | Status: DC
  Administered 2017-11-23: 2 [IU] via SUBCUTANEOUS
  Administered 2017-11-24 (×2): 3 [IU] via SUBCUTANEOUS
  Administered 2017-11-25 – 2017-11-26 (×3): 1 [IU] via SUBCUTANEOUS
  Filled 2017-11-23 (×2): qty 1

## 2017-11-23 MED ORDER — SENNA 8.6 MG PO TABS
2.0000 | ORAL_TABLET | Freq: Every day | ORAL | Status: DC
  Administered 2017-11-25: 17.2 mg via ORAL
  Filled 2017-11-23 (×2): qty 2

## 2017-11-23 MED ORDER — PREDNISONE 5 MG PO TABS
5.0000 mg | ORAL_TABLET | Freq: Every day | ORAL | Status: DC
  Administered 2017-11-24 – 2017-11-26 (×3): 5 mg via ORAL
  Filled 2017-11-23: qty 0.5
  Filled 2017-11-23 (×3): qty 1

## 2017-11-23 MED ORDER — CIPROFLOXACIN IN D5W 400 MG/200ML IV SOLN
400.0000 mg | Freq: Once | INTRAVENOUS | Status: AC
  Administered 2017-11-23: 400 mg via INTRAVENOUS
  Filled 2017-11-23: qty 200

## 2017-11-23 MED ORDER — POTASSIUM CHLORIDE IN NACL 20-0.9 MEQ/L-% IV SOLN
INTRAVENOUS | Status: DC
  Administered 2017-11-23: 23:00:00 via INTRAVENOUS
  Filled 2017-11-23: qty 1000

## 2017-11-23 MED ORDER — CALCIUM CARBONATE-VITAMIN D 500-200 MG-UNIT PO TABS
1.0000 | ORAL_TABLET | Freq: Every day | ORAL | Status: DC
  Administered 2017-11-24 – 2017-11-26 (×3): 1 via ORAL
  Filled 2017-11-23 (×3): qty 1

## 2017-11-23 MED ORDER — RIVAROXABAN 20 MG PO TABS: 20.0000 mg | ORAL_TABLET | Freq: Every day | ORAL | Status: DC

## 2017-11-23 MED ORDER — LEVOFLOXACIN IN D5W 750 MG/150ML IV SOLN
750.0000 mg | INTRAVENOUS | Status: DC
  Administered 2017-11-23 – 2017-11-24 (×2): 750 mg via INTRAVENOUS
  Filled 2017-11-23 (×2): qty 150

## 2017-11-23 MED ORDER — METOPROLOL TARTRATE 25 MG PO TABS
25.0000 mg | ORAL_TABLET | Freq: Two times a day (BID) | ORAL | Status: DC
  Administered 2017-11-24: 25 mg via ORAL
  Filled 2017-11-23: qty 1

## 2017-11-23 MED ORDER — POLYETHYLENE GLYCOL 3350 17 G PO PACK: 17.0000 g | PACK | Freq: Every day | ORAL | Status: DC | PRN

## 2017-11-23 MED ORDER — SODIUM CHLORIDE 0.9 % IV SOLN
INTRAVENOUS | Status: DC
  Administered 2017-11-23: 18:00:00 via INTRAVENOUS

## 2017-11-23 MED ORDER — SODIUM CHLORIDE 0.9% FLUSH
3.0000 mL | Freq: Two times a day (BID) | INTRAVENOUS | Status: DC
  Administered 2017-11-24 – 2017-11-26 (×4): 3 mL via INTRAVENOUS

## 2017-11-23 MED ORDER — ACETAMINOPHEN 650 MG RE SUPP: 650.0000 mg | Freq: Four times a day (QID) | RECTAL | Status: DC | PRN

## 2017-11-23 MED ORDER — SODIUM CHLORIDE 0.9 % IV BOLUS
250.0000 mL | Freq: Once | INTRAVENOUS | Status: AC
  Administered 2017-11-23: 250 mL via INTRAVENOUS

## 2017-11-23 MED ORDER — IOPAMIDOL (ISOVUE-370) INJECTION 76%
100.0000 mL | Freq: Once | INTRAVENOUS | Status: AC | PRN
  Administered 2017-11-23: 100 mL via INTRAVENOUS

## 2017-11-23 NOTE — ED Notes (Signed)
MD Thurnell Garbe notified pt is requiring sternal rubs in order to respond. Family at bedside.

## 2017-11-23 NOTE — ED Notes (Signed)
Dr. Myna Hidalgo informed of bed status at [facility].

## 2017-11-23 NOTE — ED Provider Notes (Signed)
[facility] EMERGENCY DEPARTMENT Provider Note   CSN: [id number] Arrival date & time: 11/23/17  1712     History   Chief Complaint Chief Complaint  Patient presents with  . Drug Overdose    HPI Elizabeth Drake is a 77 y.o. female.  The history is provided by the EMS personnel. The history is limited by the condition of the patient (AMS).  Drug Overdose   Pt was seen at 1720. Per EMS and family on scene: Family found pt "difficult to arouse," and called EMS. EMS noted pt lethargic with RR 8.  EMS states pt's hydrocodone/APAP bottle was missing 12 tabs (filled on 11/21/2017). EMS gave IV narcan 1mg  with partial effect. Pt lethargic and yawning on arrival to ED.   Past Medical History:  Diagnosis Date  . Arthritis   . Atrial fibrillation ([place])   . Chronic pain   . Diabetes mellitus without complication ([place])   . DVT (deep venous thrombosis) ([place])   . Hiatal hernia   . Hypertension   . Osteoporosis   . Pulmonary emboli ([place])   . Sarcoidosis   . Vertigo   . Wheelchair bound     Patient Active Problem List   Diagnosis Date Noted  . Encounter for long-term use of opiate analgesic 06/12/2016  . Pain management contract agreement 06/12/2016  . Protein-calorie malnutrition, severe 06/28/2015  . Closed left hip fracture ([place]) 06/25/2015  . Atrial fibrillation with RVR ([place]) 09/13/2014  . Hypertension 10/12/2013  . Physical deconditioning 06/30/2013  . Diabetes ([place]) 04/25/2013  . OAB (overactive bladder) 04/25/2013  . Wheelchair bound 04/25/2013  . Osteoporosis 04/25/2013  . Chronic pain syndrome (LEGS/BACK) 04/25/2013  . OA (osteoarthritis) 04/25/2013  . Hyperlipidemia 12/31/2012  . 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 ABDOMINAL HYSTERECTOMY    . I&D EXTREMITY Left 09/13/2014   Procedure: IRRIGATION AND DEBRIDEMENT EXTREMITY;  Surgeon: Linna Hoff, MD;  Location: [place];  Service: Orthopedics;  Laterality:  Left;  . INTRAMEDULLARY (IM) NAIL INTERTROCHANTERIC Left 06/27/2015   Procedure: INTRAMEDULLARY (IM) NAIL INTERTROCHANTRIC AFFIXIS;  Surgeon: Marybelle Killings, MD;  Location: [place];  Service: Orthopedics;  Laterality: Left;  . JOINT REPLACEMENT     toe  . LUNG LOBECTOMY Left    Lower     OB History   None      Home Medications    Prior to Admission medications   Medication Sig Start Date End Date Taking? Authorizing Provider  acetaminophen (TYLENOL) 500 MG tablet Take 2 tablets (1,000 mg total) by mouth every 8 (eight) hours. 06/29/15   Ghimire, Henreitta Leber, MD  calcium-vitamin D (OSCAL 500/200 D-3) 500-200 MG-UNIT per tablet Take 1 tablet by mouth daily with breakfast.    [provider]  feeding supplement (BOOST / RESOURCE BREEZE) LIQD Take 1 Container by mouth 2 (two) times daily between meals. 06/29/15   Ghimire, Henreitta Leber, MD  glimepiride (AMARYL) 2 MG tablet Take 1 tablet (2 mg total) by mouth daily. 07/10/17   Hassell Done Mary-Margaret, FNP  HYDROcodone-acetaminophen (NORCO) 10-325 MG tablet Take 1 tablet by mouth 2 (two) times daily as needed. 10/14/17   Hassell Done Mary-Margaret, FNP  HYDROcodone-acetaminophen (NORCO) 10-325 MG tablet Take 1 tablet by mouth 2 (two) times daily. 10/14/17   Hassell Done Mary-Margaret, FNP  HYDROcodone-acetaminophen (NORCO) 10-325 MG tablet Take 1 tablet by mouth 2 (two) times daily. 10/14/17   Hassell Done Mary-Margaret, FNP  meclizine (ANTIVERT) 25 MG tablet  Take 1 tablet (25 mg total) by mouth 3 (three) times daily as needed for dizziness. 01/01/17   Hassell Done, Mary-Margaret, FNP  metFORMIN (GLUCOPHAGE) 500 MG tablet Take 1 tablet (500 mg total) by mouth daily. 07/10/17   Hassell Done Mary-Margaret, FNP  metoprolol tartrate (LOPRESSOR) 25 MG tablet Take 1 tablet (25 mg total) by mouth 2 (two) times daily. 10/14/17   Hassell Done, Mary-Margaret, FNP  mometasone (NASONEX) 50 MCG/ACT nasal spray 2 sprays per nostril qd Patient taking differently: Place 2 sprays into the nose daily  as needed (allergies). 2 sprays per nostril qd 12/21/13   Lysbeth Penner, FNP  polyethylene glycol (MIRALAX / GLYCOLAX) packet Take 17 g by mouth daily as needed. 06/29/15   Ghimire, Henreitta Leber, MD  predniSONE (DELTASONE) 5 MG tablet TAKE 1 TABLET BY MOUTH EVERY DAY WITH BREAKFAST 10/14/17   Hassell Done, Mary-Margaret, FNP  rivaroxaban (XARELTO) 20 MG TABS tablet TAKE ONE TABLET BY MOUTH ONE TIME DAILY WITH SUPPER 10/14/17   Hassell Done, Mary-Margaret, FNP  senna (SENOKOT) 8.6 MG TABS tablet Take 2 tablets (17.2 mg total) by mouth at bedtime. 06/29/15   GhimireHenreitta Leber, MD    Family History Family History  Problem Relation Age of Onset  . Heart disease Mother   . Hypertension Mother   . Cancer Father        pancratic  . Cancer - Other Sister     Social History Social History   Tobacco Use  . Smoking status: Former Smoker    Types: Cigarettes    Last attempt to quit: 12/31/2000    Years since quitting: 16.9  . Smokeless tobacco: Never Used  Substance Use Topics  . Alcohol use: No  . Drug use: No     Allergies   Advil [ibuprofen]; Propoxyphene; Azo [phenazopyridine]; Indocin [indomethacin]; Keflex [cephalexin]; Loratadine; and Percocet [oxycodone-acetaminophen]   Review of Systems Review of Systems  Unable to perform ROS: Mental status change     Physical Exam Updated Vital Signs BP (!) 174/63   Pulse 78   Resp 17   SpO2 98%    Patient Vitals for the past 24 hrs:  BP Temp Temp src Pulse Resp SpO2  11/23/17 1941 139/63 98.4 F (36.9 C) Bladder 67 16 100 %  11/23/17 1830 (!) 168/75 - - 65 (!) 21 98 %  11/23/17 1808 - 98.1 F (36.7 C) Core - - -  11/23/17 1806 - - - 66 20 97 %  11/23/17 1800 (!) 146/83 - - 71 20 (!) 80 %  11/23/17 1730 (!) 169/68 - - 72 (!) 25 100 %  11/23/17 1716 (!) 174/63 - Oral 78 17 98 %     Physical Exam 1725: Physical examination:  Nursing notes reviewed; Vital signs and O2 SAT reviewed;  Constitutional: Well developed, Well nourished, In no  acute distress; Head:  Normocephalic, atraumatic; Eyes: EOMI, PERRL, No scleral icterus; ENMT: Mouth and pharynx normal, Mucous membranes dry; Neck: Supple, Full range of motion, No lymphadenopathy; Cardiovascular: Regular rate and rhythm, No gallop; Respiratory: Breath sounds clear & equal bilaterally, No wheezes. Normal respiratory effort/excursion; Chest: Nontender, Movement normal; Abdomen: Soft, Nontender, Nondistended, Normal bowel sounds; Genitourinary: No CVA tenderness; Extremities: Peripheral pulses normal, +multiple new and healing abrasions bilat tibial areas. No erythema, No edema, No calf edema or asymmetry.; Neuro: Lethargic, yawning, laying eyes closed, not speaking. No facial droop. Moves upper extremities on stretcher spontaneously. Does not follow commands..; Skin: Color normal, Warm, Dry.   ED Treatments / Results  Labs (all labs ordered are listed, but only abnormal results are displayed)   EKG EKG Interpretation  Date/Time:  Saturday November 23 2017 18:04:34 EDT Ventricular Rate:  67 PR Interval:    QRS Duration: 130 QT Interval:  435 QTC Calculation: 460 R Axis:   -57 Text Interpretation:  Normal sinus rhythm Nonspecific IVCD with LAD LVH with secondary repolarization abnormality Anterior Q waves, possibly due to LVH Artifact When compared with ECG of 06/26/2015 Artifact is now Present Otherwise no significant change Confirmed by Francine Graven [id number]) on 11/23/2017 6:56:50 PM   Radiology   Procedures Procedures (including critical care time)  Medications Ordered in ED Medications  0.9 %  sodium chloride infusion ( Intravenous New Bag/Given 11/23/17 1804)  ciprofloxacin (CIPRO) IVPB 400 mg (400 mg Intravenous New Bag/Given 11/23/17 1953)  iopamidol (ISOVUE-370) 76 % injection 100 mL (has no administration in time range)  naloxone [facility]) injection 1 mg (1 mg Intravenous Given 11/23/17 1729)     Initial Impression / Assessment and Plan / ED Course  I have  reviewed the triage vital signs and the nursing notes.  Pertinent labs & imaging results that were available during my care of the patient were reviewed by me and considered in my medical decision making (see chart for details).  MDM Reviewed: previous chart, nursing note and vitals Reviewed previous: labs and ECG Interpretation: labs, ECG, x-ray and CT scan Total time providing critical care: 30-74 minutes. This excludes time spent performing separately reportable procedures and services. Consults: admitting MD   CRITICAL CARE Performed by: Alfonzo Feller Total critical care time: 45 minutes Critical care time was exclusive of separately billable procedures and treating other patients. Critical care was necessary to treat or prevent imminent or life-threatening deterioration. Critical care was time spent personally by me on the following activities: development of treatment plan with patient and/or surrogate as well as nursing, discussions with consultants, evaluation of patient's response to treatment, examination of patient, obtaining history from patient or surrogate, ordering and performing treatments and interventions, ordering and review of laboratory studies, ordering and review of radiographic studies, pulse oximetry and re-evaluation of patient's condition.  Results for orders placed or performed during the hospital encounter of 11/23/17  CBC with Differential  Result Value Ref Range   WBC 11.7 (H) 4.0 - 10.5 K/uL   RBC 3.74 (L) 3.87 - 5.11 MIL/uL   Hemoglobin 11.5 (L) 12.0 - 15.0 g/dL   HCT 36.7 36.0 - 46.0 %   MCV 98.1 78.0 - 100.0 fL   MCH 30.7 26.0 - 34.0 pg   MCHC 31.3 30.0 - 36.0 g/dL   RDW 12.5 11.5 - 15.5 %   Platelets 218 150 - 400 K/uL   Neutrophils Relative % 83 %   Neutro Abs 9.7 (H) 1.7 - 7.7 K/uL   Lymphocytes Relative 12 %   Lymphs Abs 1.5 0.7 - 4.0 K/uL   Monocytes Relative 5 %   Monocytes Absolute 0.5 0.1 - 1.0 K/uL   Eosinophils Relative 0 %    Eosinophils Absolute 0.0 0.0 - 0.7 K/uL   Basophils Relative 0 %   Basophils Absolute 0.0 0.0 - 0.1 K/uL  Protime-INR  Result Value Ref Range   Prothrombin Time 13.0 11.4 - 15.2 seconds   INR 0.99   Lactic acid, plasma  Result Value Ref Range   Lactic Acid, Venous 2.1 (HH) 0.5 - 1.9 mmol/L  Troponin I  Result Value Ref Range   Troponin I <0.03 <0.03 ng/mL  Salicylate level  Result Value Ref Range   Salicylate Lvl <5.4 2.8 - 30.0 mg/dL  Ethanol  Result Value Ref Range   Alcohol, Ethyl (B) <10 <10 mg/dL  Comprehensive metabolic panel  Result Value Ref Range   Sodium 139 135 - 145 mmol/L   Potassium 3.6 3.5 - 5.1 mmol/L   Chloride 105 101 - 111 mmol/L   CO2 26 22 - 32 mmol/L   Glucose, Bld 172 (H) 65 - 99 mg/dL   BUN 17 6 - 20 mg/dL   Creatinine, Ser 0.62 0.44 - 1.00 mg/dL   Calcium 8.7 (L) 8.9 - 10.3 mg/dL   Total Protein 8.4 (H) 6.5 - 8.1 g/dL   Albumin 3.4 (L) 3.5 - 5.0 g/dL   AST 19 15 - 41 U/L   ALT 11 (L) 14 - 54 U/L   Alkaline Phosphatase 69 38 - 126 U/L   Total Bilirubin 0.4 0.3 - 1.2 mg/dL   GFR calc non Af Amer >60 >60 mL/min   GFR calc Af Amer >60 >60 mL/min   Anion gap 8 5 - 15  Acetaminophen level  Result Value Ref Range   Acetaminophen (Tylenol), Serum <10 (L) 10 - 30 ug/mL  Urine rapid drug screen (hosp performed)  Result Value Ref Range   Opiates POSITIVE (A) NONE DETECTED   Cocaine NONE DETECTED NONE DETECTED   Benzodiazepines NONE DETECTED NONE DETECTED   Amphetamines NONE DETECTED NONE DETECTED   Tetrahydrocannabinol NONE DETECTED NONE DETECTED   Barbiturates NONE DETECTED NONE DETECTED  Urinalysis, Routine w reflex microscopic  Result Value Ref Range   Color, Urine YELLOW YELLOW   APPearance HAZY (A) CLEAR   Specific Gravity, Urine 1.013 1.005 - 1.030   pH 6.0 5.0 - 8.0   Glucose, UA 50 (A) NEGATIVE mg/dL   Hgb urine dipstick MODERATE (A) NEGATIVE   Bilirubin Urine NEGATIVE NEGATIVE   Ketones, ur NEGATIVE NEGATIVE mg/dL   Protein, ur  NEGATIVE NEGATIVE mg/dL   Nitrite POSITIVE (A) NEGATIVE   Leukocytes, UA NEGATIVE NEGATIVE   RBC / HPF 6-30 0 - 5 RBC/hpf   WBC, UA 0-5 0 - 5 WBC/hpf   Bacteria, UA MANY (A) NONE SEEN   Squamous Epithelial / LPF 0-5 (A) NONE SEEN   Mucus PRESENT    Granular Casts, UA PRESENT   Blood gas, arterial  Result Value Ref Range   FIO2 21.00    pH, Arterial 7.418 7.350 - 7.450   pCO2 arterial 41.1 32.0 - 48.0 mmHg   pO2, Arterial 71.7 (L) 83.0 - 108.0 mmHg   Bicarbonate 26.0 20.0 - 28.0 mmol/L   Acid-Base Excess 2.0 0.0 - 2.0 mmol/L   O2 Saturation 93.3 %   Collection site RIGHT RADIAL    Drawn by [id number]    Sample type ARTERIAL    Allens test (pass/fail) PASS PASS  CBG monitoring, ED  Result Value Ref Range   Glucose-Capillary 157 (H) 65 - 99 mg/dL   Dg Chest 1 View Result Date: 11/23/2017 CLINICAL DATA:  Altered mental status EXAM: CHEST  1 VIEW COMPARISON:  09/13/2014, 08/13/2014 FINDINGS: Mildly low lung volume. Small left pleural effusion with dense airspace disease at the lingula and left base. Cardiomegaly with aortic atherosclerosis. Minimal vascular congestion. No pneumothorax. IMPRESSION: 1. Mild left pleural effusion or thickening. Airspace disease at the lingula and left base, some of this may be chronic but difficult to exclude an acute infiltrate in the region 2. Cardiomegaly with mild vascular congestion Electronically Signed  By: Donavan Foil M.D.   On: 11/23/2017 18:29   Ct Head Wo Contrast Result Date: 11/23/2017 CLINICAL DATA:  Lethargy following hydrocodone administration EXAM: CT HEAD WITHOUT CONTRAST CT CERVICAL SPINE WITHOUT CONTRAST TECHNIQUE: Multidetector CT imaging of the head and cervical spine was performed following the standard protocol without intravenous contrast. Multiplanar CT image reconstructions of the cervical spine were also generated. COMPARISON:  11/15/2007 FINDINGS: CT HEAD FINDINGS Brain: No evidence of acute infarction, hemorrhage, hydrocephalus,  extra-axial collection or mass lesion/mass effect. Chronic atrophic and ischemic changes are noted. Vascular: No hyperdense vessel or unexpected calcification. Skull: Normal. Negative for fracture or focal lesion. Sinuses/Orbits: No acute finding. Other: None. CT CERVICAL SPINE FINDINGS Alignment: Alignment is within normal limits with the exception of degenerative anterolisthesis of C7 on T1. Skull base and vertebrae: 7 cervical segments are noted. Disc space narrowing is noted at C5-6, C6-7 and C7-T1. Significant sclerosis is noted at the C7-T1 interspace. Degenerative anterolisthesis is seen at C7-T1. Associated central canal stenosis is noted at this level. No acute fracture or acute facet abnormality is noted. Facet hypertrophic changes are noted at multiple levels. Soft tissues and spinal canal: No prevertebral fluid or swelling. No visible canal hematoma. Upper chest: Within normal limits. Other: None IMPRESSION: CT of the head: Chronic atrophic and ischemic changes without acute abnormality. CT of the cervical spine: Multilevel degenerative change without acute abnormality Electronically Signed   By: Inez Catalina M.D.   On: 11/23/2017 18:33   Ct Cervical Spine Wo Contrast Result Date: 11/23/2017 CLINICAL DATA:  Lethargy following hydrocodone administration EXAM: CT HEAD WITHOUT CONTRAST CT CERVICAL SPINE WITHOUT CONTRAST TECHNIQUE: Multidetector CT imaging of the head and cervical spine was performed following the standard protocol without intravenous contrast. Multiplanar CT image reconstructions of the cervical spine were also generated. COMPARISON:  11/15/2007 FINDINGS: CT HEAD FINDINGS Brain: No evidence of acute infarction, hemorrhage, hydrocephalus, extra-axial collection or mass lesion/mass effect. Chronic atrophic and ischemic changes are noted. Vascular: No hyperdense vessel or unexpected calcification. Skull: Normal. Negative for fracture or focal lesion. Sinuses/Orbits: No acute finding. Other:  None. CT CERVICAL SPINE FINDINGS Alignment: Alignment is within normal limits with the exception of degenerative anterolisthesis of C7 on T1. Skull base and vertebrae: 7 cervical segments are noted. Disc space narrowing is noted at C5-6, C6-7 and C7-T1. Significant sclerosis is noted at the C7-T1 interspace. Degenerative anterolisthesis is seen at C7-T1. Associated central canal stenosis is noted at this level. No acute fracture or acute facet abnormality is noted. Facet hypertrophic changes are noted at multiple levels. Soft tissues and spinal canal: No prevertebral fluid or swelling. No visible canal hematoma. Upper chest: Within normal limits. Other: None IMPRESSION: CT of the head: Chronic atrophic and ischemic changes without acute abnormality. CT of the cervical spine: Multilevel degenerative change without acute abnormality Electronically Signed   By: Inez Catalina M.D.   On: 11/23/2017 18:33    Ct Angio Head W/cm &/or Wo Cm Result Date: 11/23/2017 CLINICAL DATA:  77 y/o  F; altered mental status of unclear cause. EXAM: CT ANGIOGRAPHY HEAD AND NECK CT PERFUSION BRAIN TECHNIQUE: Multidetector CT imaging of the head and neck was performed using the standard protocol during bolus administration of intravenous contrast. Multiplanar CT image reconstructions and MIPs were obtained to evaluate the vascular anatomy. Carotid stenosis measurements (when applicable) are obtained utilizing NASCET criteria, using the distal internal carotid diameter as the denominator. Multiphase CT imaging of the brain was performed following IV bolus contrast injection.  Subsequent parametric perfusion maps were calculated using RAPID software. CONTRAST:  193mL ISOVUE-370 IOPAMIDOL (ISOVUE-370) INJECTION 76% COMPARISON:  11/23/2017 CT head.  12/09/2004 MRI head. FINDINGS: CT HEAD FINDINGS Brain: Region of hypoattenuation within the right superior cerebellar hemisphere and extending into the right hemi pons compatible with late acute  to subacute infarction (series 5, image 81 and series 3, image 12). No associated hemorrhage or mass effect. No additional region of acute infarction, hemorrhage, or mass effect in the brain identified. Stable background of chronic microvascular ischemic changes and parenchymal volume loss of the supratentorial brain. Several small chronic lacunar infarcts within basal ganglia. Vascular: As below. Skull: Normal. Negative for fracture or focal lesion. Sinuses/Orbits: No acute finding. Other: None. ASPECTS ([place] Stroke Program Early CT Score) - Ganglionic level infarction (caudate, lentiform nuclei, internal capsule, insula, M1-M3 cortex): 7 - Supraganglionic infarction (M4-M6 cortex): 3 Total score (0-10 with 10 being normal): 10 Review of the MIP images confirms the above findings CTA NECK FINDINGS Aortic arch: Standard branching. Imaged portion shows no evidence of aneurysm or dissection. No significant stenosis of the major arch vessel origins. Mild calcific atherosclerosis. Right carotid system: No evidence of dissection, stenosis (50% or greater) or occlusion. Mild non stenotic calcific atherosclerosis of the carotid bifurcation. Left carotid system: No evidence of dissection, stenosis (50% or greater) or occlusion. Mild non stenotic calcific atherosclerosis of the carotid bifurcation. Vertebral arteries: Left dominant. No evidence of dissection, stenosis (50% or greater) or occlusion. Skeleton: C6-T1 severe erosive discogenic degenerative changes with endplate eburnation and grade 1 anterolisthesis at the C7-T1 level with there is severe facet arthritis. Bones are demineralized. Other neck: Multiple thyroid nodules measuring up to 9 mm on the left. Upper chest: Negative. Review of the MIP images confirms the above findings CTA HEAD FINDINGS Anterior circulation: No significant stenosis, proximal occlusion, aneurysm, or vascular malformation. The right ICA is larger than the left ICA likely due to variant  anatomy where bilateral ACA are perfused by the right A1. Posterior circulation: Left P1 short segment of mild stenosis and right P1/P2 tandem segments of severe stenosis. Irregularity and mild stenosis of the left SCA origin. The right SCA attenuates in caliber early and appears occluded (series 12, image 120 and series 15, image 26). Venous sinuses: As permitted by contrast timing, patent. Anatomic variants: Anterior communicating artery and right posterior communicating artery. No left posterior communicating artery identified, likely hypoplastic or absent. Review of the MIP images confirms the above findings. CT Brain Perfusion Findings: CBF (<30%) Volume: 81mL Perfusion (Tmax>6.0s) volume: 35mL Mismatch Volume: 45mL Infarction Location:Right hemi pons and superior cerebellar hemisphere infarction. The area of infarction on noncontrast CT of head is larger than CBF (<30%) Volume indicating pseudonormalization of a region of the core infarction. IMPRESSION: CT head: 1. 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 No hemorrhage or associated mass effect. 2. Stable supratentorial chronic microvascular ischemic changes and parenchymal volume loss of the brain. CTA neck: 1. Patent carotid and vertebral arteries. No dissection, aneurysm, or hemodynamically significant stenosis utilizing NASCET criteria. 2. Severe cervical spondylosis at the C6-T1 levels. CTA head: 1. Right SCA occlusion. 2. Tandem segments of severe stenosis of right PCA. 3. Otherwise no large vessel occlusion, aneurysm, or significant stenosis is identified. CT brain perfusion: Perfusion anomaly in the right SCA distribution. The area of infarction on noncontrast CT of head is larger than CBF (<30%) Volume indicating pseudonormalization of a region of the core infarction. These results were called by  telephone at the time of interpretation on 11/23/2017 at 9:03 pm to Dr. Francine Graven , who verbally acknowledged  these results. Electronically Signed   By: Kristine Garbe M.D.   On: 11/23/2017 21:07    Ct Angio Neck W And/or Wo Contrast Result Date: 11/23/2017 CLINICAL DATA:  77 y/o  F; altered mental status of unclear cause. EXAM: CT ANGIOGRAPHY HEAD AND NECK CT PERFUSION BRAIN TECHNIQUE: Multidetector CT imaging of the head and neck was performed using the standard protocol during bolus administration of intravenous contrast. Multiplanar CT image reconstructions and MIPs were obtained to evaluate the vascular anatomy. Carotid stenosis measurements (when applicable) are obtained utilizing NASCET criteria, using the distal internal carotid diameter as the denominator. Multiphase CT imaging of the brain was performed following IV bolus contrast injection. Subsequent parametric perfusion maps were calculated using RAPID software. CONTRAST:  186mL ISOVUE-370 IOPAMIDOL (ISOVUE-370) INJECTION 76% COMPARISON:  11/23/2017 CT head.  12/09/2004 MRI head. FINDINGS: CT HEAD FINDINGS Brain: Region of hypoattenuation within the right superior cerebellar hemisphere and extending into the right hemi pons compatible with late acute to subacute infarction (series 5, image 12 and series 3, image 12). No associated hemorrhage or mass effect. No additional region of acute infarction, hemorrhage, or mass effect in the brain identified. Stable background of chronic microvascular ischemic changes and parenchymal volume loss of the supratentorial brain. Several small chronic lacunar infarcts within basal ganglia. Vascular: As below. Skull: Normal. Negative for fracture or focal lesion. Sinuses/Orbits: No acute finding. Other: None. ASPECTS ([place] Stroke Program Early CT Score) - Ganglionic level infarction (caudate, lentiform nuclei, internal capsule, insula, M1-M3 cortex): 7 - Supraganglionic infarction (M4-M6 cortex): 3 Total score (0-10 with 10 being normal): 10 Review of the MIP images confirms the above findings CTA NECK FINDINGS  Aortic arch: Standard branching. Imaged portion shows no evidence of aneurysm or dissection. No significant stenosis of the major arch vessel origins. Mild calcific atherosclerosis. Right carotid system: No evidence of dissection, stenosis (50% or greater) or occlusion. Mild non stenotic calcific atherosclerosis of the carotid bifurcation. Left carotid system: No evidence of dissection, stenosis (50% or greater) or occlusion. Mild non stenotic calcific atherosclerosis of the carotid bifurcation. Vertebral arteries: Left dominant. No evidence of dissection, stenosis (50% or greater) or occlusion. Skeleton: C6-T1 severe erosive discogenic degenerative changes with endplate eburnation and grade 1 anterolisthesis at the C7-T1 level with there is severe facet arthritis. Bones are demineralized. Other neck: Multiple thyroid nodules measuring up to 9 mm on the left. Upper chest: Negative. Review of the MIP images confirms the above findings CTA HEAD FINDINGS Anterior circulation: No significant stenosis, proximal occlusion, aneurysm, or vascular malformation. The right ICA is larger than the left ICA likely due to variant anatomy where bilateral ACA are perfused by the right A1. Posterior circulation: Left P1 short segment of mild stenosis and right P1/P2 tandem segments of severe stenosis. Irregularity and mild stenosis of the left SCA origin. The right SCA attenuates in caliber early and appears occluded (series 12, image 120 and series 15, image 26). Venous sinuses: As permitted by contrast timing, patent. Anatomic variants: Anterior communicating artery and right posterior communicating artery. No left posterior communicating artery identified, likely hypoplastic or absent. Review of the MIP images confirms the above findings. CT Brain Perfusion Findings: CBF (<30%) Volume: 80mL Perfusion (Tmax>6.0s) volume: 16mL Mismatch Volume: 74mL Infarction Location:Right hemi pons and superior cerebellar hemisphere infarction. The  area of infarction on noncontrast CT of head is larger than CBF (<30%)  Volume indicating pseudonormalization of a region of the core infarction. IMPRESSION: CT head: 1. 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 No hemorrhage or associated mass effect. 2. Stable supratentorial chronic microvascular ischemic changes and parenchymal volume loss of the brain. CTA neck: 1. Patent carotid and vertebral arteries. No dissection, aneurysm, or hemodynamically significant stenosis utilizing NASCET criteria. 2. Severe cervical spondylosis at the C6-T1 levels. CTA head: 1. Right SCA occlusion. 2. Tandem segments of severe stenosis of right PCA. 3. Otherwise no large vessel occlusion, aneurysm, or significant stenosis is identified. CT brain perfusion: Perfusion anomaly in the right SCA distribution. The area of infarction on noncontrast CT of head is larger than CBF (<30%) Volume indicating pseudonormalization of a region of the core infarction. These results were called by telephone at the time of interpretation on 11/23/2017 at 9:03 pm to Dr. Francine Graven , who verbally acknowledged these results. Electronically Signed   By: Kristine Garbe M.D.   On: 11/23/2017 21:07    Ct Cerebral Perfusion W Contrast Result Date: 11/23/2017 CLINICAL DATA:  77 y/o  F; altered mental status of unclear cause. EXAM: CT ANGIOGRAPHY HEAD AND NECK CT PERFUSION BRAIN TECHNIQUE: Multidetector CT imaging of the head and neck was performed using the standard protocol during bolus administration of intravenous contrast. Multiplanar CT image reconstructions and MIPs were obtained to evaluate the vascular anatomy. Carotid stenosis measurements (when applicable) are obtained utilizing NASCET criteria, using the distal internal carotid diameter as the denominator. Multiphase CT imaging of the brain was performed following IV bolus contrast injection. Subsequent parametric perfusion maps were  calculated using RAPID software. CONTRAST:  170mL ISOVUE-370 IOPAMIDOL (ISOVUE-370) INJECTION 76% COMPARISON:  11/23/2017 CT head.  12/09/2004 MRI head. FINDINGS: CT HEAD FINDINGS Brain: Region of hypoattenuation within the right superior cerebellar hemisphere and extending into the right hemi pons compatible with late acute to subacute infarction (series 5, image 73 and series 3, image 12). No associated hemorrhage or mass effect. No additional region of acute infarction, hemorrhage, or mass effect in the brain identified. Stable background of chronic microvascular ischemic changes and parenchymal volume loss of the supratentorial brain. Several small chronic lacunar infarcts within basal ganglia. Vascular: As below. Skull: Normal. Negative for fracture or focal lesion. Sinuses/Orbits: No acute finding. Other: None. ASPECTS ([place] Stroke Program Early CT Score) - Ganglionic level infarction (caudate, lentiform nuclei, internal capsule, insula, M1-M3 cortex): 7 - Supraganglionic infarction (M4-M6 cortex): 3 Total score (0-10 with 10 being normal): 10 Review of the MIP images confirms the above findings CTA NECK FINDINGS Aortic arch: Standard branching. Imaged portion shows no evidence of aneurysm or dissection. No significant stenosis of the major arch vessel origins. Mild calcific atherosclerosis. Right carotid system: No evidence of dissection, stenosis (50% or greater) or occlusion. Mild non stenotic calcific atherosclerosis of the carotid bifurcation. Left carotid system: No evidence of dissection, stenosis (50% or greater) or occlusion. Mild non stenotic calcific atherosclerosis of the carotid bifurcation. Vertebral arteries: Left dominant. No evidence of dissection, stenosis (50% or greater) or occlusion. Skeleton: C6-T1 severe erosive discogenic degenerative changes with endplate eburnation and grade 1 anterolisthesis at the C7-T1 level with there is severe facet arthritis. Bones are demineralized. Other  neck: Multiple thyroid nodules measuring up to 9 mm on the left. Upper chest: Negative. Review of the MIP images confirms the above findings CTA HEAD FINDINGS Anterior circulation: No significant stenosis, proximal occlusion, aneurysm, or vascular malformation. The right ICA is larger than the left ICA  likely due to variant anatomy where bilateral ACA are perfused by the right A1. Posterior circulation: Left P1 short segment of mild stenosis and right P1/P2 tandem segments of severe stenosis. Irregularity and mild stenosis of the left SCA origin. The right SCA attenuates in caliber early and appears occluded (series 12, image 120 and series 15, image 26). Venous sinuses: As permitted by contrast timing, patent. Anatomic variants: Anterior communicating artery and right posterior communicating artery. No left posterior communicating artery identified, likely hypoplastic or absent. Review of the MIP images confirms the above findings. CT Brain Perfusion Findings: CBF (<30%) Volume: 19mL Perfusion (Tmax>6.0s) volume: 79mL Mismatch Volume: 27mL Infarction Location:Right hemi pons and superior cerebellar hemisphere infarction. The area of infarction on noncontrast CT of head is larger than CBF (<30%) Volume indicating pseudonormalization of a region of the core infarction. IMPRESSION: CT head: 1. 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 No hemorrhage or associated mass effect. 2. Stable supratentorial chronic microvascular ischemic changes and parenchymal volume loss of the brain. CTA neck: 1. Patent carotid and vertebral arteries. No dissection, aneurysm, or hemodynamically significant stenosis utilizing NASCET criteria. 2. Severe cervical spondylosis at the C6-T1 levels. CTA head: 1. Right SCA occlusion. 2. Tandem segments of severe stenosis of right PCA. 3. Otherwise no large vessel occlusion, aneurysm, or significant stenosis is identified. CT brain perfusion: Perfusion anomaly  in the right SCA distribution. The area of infarction on noncontrast CT of head is larger than CBF (<30%) Volume indicating pseudonormalization of a region of the core infarction. These results were called by telephone at the time of interpretation on 11/23/2017 at 9:03 pm to Dr. Francine Graven , who verbally acknowledged these results. Electronically Signed   By: Kristine Garbe M.D.   On: 11/23/2017 21:07     1730:  [place] PMP Database accessed: pt filled hydrocodone 10mg /APAP 325mg , #60, on 11/21/2017.   1845:  Family is now at bedside: state they cared for pt this morning, she was "up and ate breakfast" acting normally, they helped pt into a chair, and then they left for approximately 1 hour before returning and finding her "unresponsive." Family states that when pt runs out of her narcotic pain medication she "scrambles around and will take anything."  Pt has had minimal response to IV narcan x2. Pt is now sitting upright, eyes closed, no gaze preference, puffing her cheeks out with resps, rhythmically flexing/extending her RLE on the stretcher; no other extremities movement to indicate generalized seizure. VS remain stable. Pt is holding her own airway. Will obtain Teleneuro consult.   1855:  +UTI, UC pending; given pt's allergies, will dose IV cipro.  1920:  TeleNeuro has evaluated pt and spoken with family: pt's leg movements are per her baseline, no TPA as pt is on xarelto, Neuro MD recommends to perform CT-A head/neck and CT perfusion, transfer pt to higher level of care for further Neuro consultation and testing (ie: MRI).  Dx and testing d/w pt's family.  Questions answered.  Verb understanding, agreeable to admit/transfer to [facility].  1925:  No change in pt's status, remains somnolent, holding her own airway, resps without distress, VSS. T/C returned from [facility] Neuro Dr. Leonel Ramsay, case discussed, including:  HPI, pertinent PM/SHx, VS/PE, dx testing, ED course and treatment:  Agreeable to  consult when pt arrives to [facility].  1940:  T/C returned from [facility] Dr. Myna Hidalgo, case discussed, including:  HPI, pertinent PM/SHx, VS/PE, dx testing, ED course and treatment, as well as Neuro  consults:  Agreeable to admit/transfer to [facility].   2120:  CT-A and perfusion above. T/C returned from [facility] Neuro Dr. Leonel Ramsay, case discussed, including:  HPI, pertinent PM/SHx, VS/PE, dx testing, ED course and treatment:  He has viewed the images, states no intervention at this time, pt likely to have significant deficits from this area of stroke. T/C returned from [facility] Dr. Myna Hidalgo: updated. Pt's family also updated with testing results and d/w Neuro MD.      Final Clinical Impressions(s) / ED Diagnoses   Final diagnoses:  None    ED Discharge Orders    None       Francine Graven, DO 11/25/17 [id number]

## 2017-11-23 NOTE — ED Notes (Signed)
Respiratory paged for ABG.  

## 2017-11-23 NOTE — ED Notes (Signed)
Dr. Myna Hidalgo contacted and informed of patient's status. L sided facial droop noted to progress over the last 30 minutes. Patient's oxygen saturation falling, placed on nonrebreather. Vancomycin ok to run per Dr. Myna Hidalgo.

## 2017-11-23 NOTE — ED Notes (Signed)
Critical lab of Lactic 3.1 given to Dr. Thurnell Garbe.

## 2017-11-23 NOTE — ED Triage Notes (Signed)
Pt was prescribed hydrocodone 4/18.  Pt took 12 since 4/18.   Per ems, pt was lethargic with RR 8.  1mg  of narcan given. Pt is about to arouse with verbal stimuli

## 2017-11-23 NOTE — ED Notes (Signed)
Dr. Myna Hidalgo given critical value of Lactic 3.1

## 2017-11-23 NOTE — ED Notes (Signed)
Patient repositioned and placed back on O2 at 2L VIA 

## 2017-11-23 NOTE — H&P (Signed)
History and Physical    MAYRENE BASTARACHE CBJ:[id number] DOB: 09-19-1940 DOA: 11/23/2017  PCP: Chevis Pretty, FNP   Patient coming from: Home  Chief Complaint: Poorly responsive   HPI: Elizabeth Drake is a 77 y.o. 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  Patient is accompanied by her husband and son who provide most of the history.  She had reportedly been in her usual state earlier in the day, was not complaining of anything this morning, and seemed normal when her husband went outside for approximately 1 hour.  Upon returning, patient was sleeping in her chair and difficult to arouse.  EMS was called, 1 mg of IV Narcan was given, patient woke up some but without any verbal response.  There has not been any recent fall or trauma reported and the patient is not known to use alcohol.  She filled Norco 10 mg on 11/21/2017 and has 12 pills missing.  ED Course: Upon arrival to the ED, patient is found to be afebrile, saturating 80s on room air, and with vitals otherwise normal.  EKG features a sinus rhythm with nonspecific IVCD and LVH with repolarization abnormality.  Chest x-ray is notable for cardiomegaly, mild pulmonary vascular congestion, and airspace disease at the lingula and left base that may be chronic, but difficult to exclude acute infiltrate.  Chemistry panel is unremarkable and CBC is notable for leukocytosis to 11,700.  UDS is positive for opiates only, ethanol undetectable, salicylate undetectable, and acetaminophen undetectable.  Lactic acid is slightly elevated.  Noncontrast head CT is negative for acute abnormality and cervical spine CT also negative for acute finding.  Telemetry neurologist recommended CTA head and neck and transfer to [facility].  On-call  neurology at [facility] was consulted by the ED physician who accepts the patient in consultation.  Blood and urine cultures were collected in the ED and the patient was given a dose of empiric antibiotics.  She remains hemodynamically stable, remains obtunded, is not in any acute respiratory distress, ABG is reassuring, and she will be admitted to the stepdown unit at [facility] for ongoing evaluation and management of obtundation.  Review of Systems:  Unable to complete ROS secondary to clinical scenario.  Past Medical History:  Diagnosis Date  . Arthritis   . Atrial fibrillation ([place])   . Atrial fibrillation with RVR ([place]) 09/13/2014  . Chronic pain   . Diabetes mellitus without complication ([place])   . DVT (deep venous thrombosis) ([place])   . Hiatal hernia   . Hypertension   . Osteoporosis   . Pulmonary emboli ([place])   . Sarcoidosis   . Vertigo   . Wheelchair bound     Past Surgical History:  Procedure Laterality Date  . ABDOMINAL HYSTERECTOMY    . I&D EXTREMITY Left 09/13/2014   Procedure: IRRIGATION AND DEBRIDEMENT EXTREMITY;  Surgeon: Linna Hoff, MD;  Location: [place];  Service: Orthopedics;  Laterality: Left;  . INTRAMEDULLARY (IM) NAIL INTERTROCHANTERIC Left 06/27/2015   Procedure: INTRAMEDULLARY (IM) NAIL INTERTROCHANTRIC AFFIXIS;  Surgeon: Marybelle Killings, MD;  Location: [place];  Service: Orthopedics;  Laterality: Left;  . JOINT REPLACEMENT     toe  . LUNG LOBECTOMY Left    Lower     reports that she quit smoking about 16 years ago. Her smoking use included cigarettes.  She has never used smokeless tobacco. She reports that she does not drink alcohol or use drugs.  Allergies  Allergen Reactions  . Advil [Ibuprofen] Shortness Of Breath and Swelling  . Propoxyphene Swelling  . Azo [Phenazopyridine] Other (See Comments)    Unknown reaction  . Indocin [Indomethacin] Swelling and Other (See Comments)    Unknown reaction  . Keflex [Cephalexin] Other (See Comments)  .  Loratadine Other (See Comments)    unknown  . Percocet [Oxycodone-Acetaminophen] Itching    10/12/15 Patient denies allergy to APAP    Family History  Problem Relation Age of Onset  . Heart disease Mother   . Hypertension Mother   . Cancer Father        pancratic  . Cancer - Other Sister      Prior to Admission medications   Medication Sig Start Date End Date Taking? Authorizing Provider  acetaminophen (TYLENOL) 500 MG tablet Take 2 tablets (1,000 mg total) by mouth every 8 (eight) hours. 06/29/15   Ghimire, Henreitta Leber, MD  calcium-vitamin D (OSCAL 500/200 D-3) 500-200 MG-UNIT per tablet Take 1 tablet by mouth daily with breakfast.    [provider]  cephALEXin (KEFLEX) 500 MG capsule Take 500 mg by mouth 3 (three) times daily. 08/20/17   [provider]  feeding supplement (BOOST / RESOURCE BREEZE) LIQD Take 1 Container by mouth 2 (two) times daily between meals. 06/29/15   Ghimire, Henreitta Leber, MD  glimepiride (AMARYL) 2 MG tablet Take 1 tablet (2 mg total) by mouth daily. 07/10/17   Hassell Done Mary-Margaret, FNP  HYDROcodone-acetaminophen (NORCO) 10-325 MG tablet Take 1 tablet by mouth 2 (two) times daily as needed. 10/14/17   Hassell Done Mary-Margaret, FNP  HYDROcodone-acetaminophen (NORCO) 10-325 MG tablet Take 1 tablet by mouth 2 (two) times daily. 10/14/17   Hassell Done Mary-Margaret, FNP  HYDROcodone-acetaminophen (NORCO) 10-325 MG tablet Take 1 tablet by mouth 2 (two) times daily. 10/14/17   Hassell Done, Mary-Margaret, FNP  meclizine (ANTIVERT) 25 MG tablet Take 1 tablet (25 mg total) by mouth 3 (three) times daily as needed for dizziness. 01/01/17   Hassell Done, Mary-Margaret, FNP  metFORMIN (GLUCOPHAGE) 500 MG tablet Take 1 tablet (500 mg total) by mouth daily. 07/10/17   Hassell Done Mary-Margaret, FNP  metoprolol tartrate (LOPRESSOR) 25 MG tablet Take 1 tablet (25 mg total) by mouth 2 (two) times daily. 10/14/17   Hassell Done, Mary-Margaret, FNP  mometasone (NASONEX) 50 MCG/ACT nasal spray 2 sprays  per nostril qd Patient taking differently: Place 2 sprays into the nose daily as needed (allergies). 2 sprays per nostril qd 12/21/13   Lysbeth Penner, FNP  polyethylene glycol (MIRALAX / GLYCOLAX) packet Take 17 g by mouth daily as needed. 06/29/15   Ghimire, Henreitta Leber, MD  predniSONE (DELTASONE) 5 MG tablet TAKE 1 TABLET BY MOUTH EVERY DAY WITH BREAKFAST 10/14/17   Hassell Done, Mary-Margaret, FNP  rivaroxaban (XARELTO) 20 MG TABS tablet TAKE ONE TABLET BY MOUTH ONE TIME DAILY WITH SUPPER 10/14/17   Hassell Done, Mary-Margaret, FNP  senna (SENOKOT) 8.6 MG TABS tablet Take 2 tablets (17.2 mg total) by mouth at bedtime. 06/29/15   Jonetta Osgood, MD    Physical Exam: Vitals:   11/23/17 1806 11/23/17 1808 11/23/17 1830 11/23/17 1941  BP:   (!) 168/75 139/63  Pulse: 66  65 67  Resp: 20  (!) 21 16  Temp:  98.1 F (36.7 C)  98.4 F (36.9 C)  TempSrc:  Core  Bladder  SpO2: 97%  98% 100%  Constitutional: NAD, obtunded Eyes: PERTLA, lids and conjunctivae normal ENMT: Mucous membranes are dry. Posterior pharynx clear of any exudate or lesions.   Neck: normal, supple, no masses, no thyromegaly Respiratory: Rhonchi on left. No accessory muscle use.  Cardiovascular: S1 & S2 heard, regular rate and rhythm. No significant JVD. Abdomen: No distension, no tenderness, soft. Bowel sounds normal.  Musculoskeletal: no clubbing / cyanosis. No joint deformity upper and lower extremities.   Skin: no significant rashes, lesions, ulcers. Warm, dry, well-perfused. Neurologic: No facial asymmetry. Not moving LLE. Responds to tactile stimuli but no eye-opening or verbalization.  Psychiatric: Unable to assess given clinical situation.     Labs on Admission: I have personally reviewed following labs and imaging studies  CBC: Recent Labs  Lab 11/23/17 1758  WBC 11.7*  NEUTROABS 9.7*  HGB 11.5*  HCT 36.7  MCV 98.1  PLT 254   Basic Metabolic Panel: Recent Labs  Lab 11/23/17 1758  NA 139  K 3.6    CL 105  CO2 26  GLUCOSE 172*  BUN 17  CREATININE 0.62  CALCIUM 8.7*   GFR: CrCl cannot be calculated (Unknown ideal weight.). Liver Function Tests: Recent Labs  Lab 11/23/17 1758  AST 19  ALT 11*  ALKPHOS 69  BILITOT 0.4  PROT 8.4*  ALBUMIN 3.4*   No results for input(s): LIPASE, AMYLASE in the last 168 hours. No results for input(s): AMMONIA in the last 168 hours. Coagulation Profile: Recent Labs  Lab 11/23/17 1758  INR 0.99   Cardiac Enzymes: Recent Labs  Lab 11/23/17 1758  TROPONINI <0.03   BNP (last 3 results) No results for input(s): PROBNP in the last 8760 hours. HbA1C: No results for input(s): HGBA1C in the last 72 hours. CBG: Recent Labs  Lab 11/23/17 1745  GLUCAP 157*   Lipid Profile: No results for input(s): CHOL, HDL, LDLCALC, TRIG, CHOLHDL, LDLDIRECT in the last 72 hours. Thyroid Function Tests: No results for input(s): TSH, T4TOTAL, FREET4, T3FREE, THYROIDAB in the last 72 hours. Anemia Panel: No results for input(s): VITAMINB12, FOLATE, FERRITIN, TIBC, IRON, RETICCTPCT in the last 72 hours. Urine analysis:    Component Value Date/Time   COLORURINE YELLOW 11/23/2017 1740   APPEARANCEUR HAZY (A) 11/23/2017 1740   LABSPEC 1.013 11/23/2017 1740   PHURINE 6.0 11/23/2017 1740   GLUCOSEU 50 (A) 11/23/2017 1740   HGBUR MODERATE (A) 11/23/2017 1740   BILIRUBINUR NEGATIVE 11/23/2017 1740   BILIRUBINUR neg 11/27/2014 1053   KETONESUR NEGATIVE 11/23/2017 1740   PROTEINUR NEGATIVE 11/23/2017 1740   UROBILINOGEN negative 11/27/2014 1053   UROBILINOGEN 1.0 09/13/2014 1845   NITRITE POSITIVE (A) 11/23/2017 1740   LEUKOCYTESUR NEGATIVE 11/23/2017 1740   Sepsis Labs: @LABRCNTIP (procalcitonin:4,lacticidven:4) )No results found for this or any previous visit (from the past 240 hour(s)).   Radiological Exams on Admission: Dg Chest 1 View  Result Date: 11/23/2017 CLINICAL DATA:  Altered mental status EXAM: CHEST  1 VIEW COMPARISON:  09/13/2014,  08/13/2014 FINDINGS: Mildly low lung volume. Small left pleural effusion with dense airspace disease at the lingula and left base. Cardiomegaly with aortic atherosclerosis. Minimal vascular congestion. No pneumothorax. IMPRESSION: 1. Mild left pleural effusion or thickening. Airspace disease at the lingula and left base, some of this may be chronic but difficult to exclude an acute infiltrate in the region 2. Cardiomegaly with mild vascular congestion Electronically Signed   By: Donavan Foil M.D.   On: 11/23/2017 18:29   Ct Head Wo Contrast  Result Date: 11/23/2017 CLINICAL DATA:  Lethargy following hydrocodone administration EXAM: CT HEAD WITHOUT CONTRAST CT CERVICAL SPINE WITHOUT CONTRAST TECHNIQUE: Multidetector CT imaging of the head and cervical spine was performed following the standard protocol without intravenous contrast. Multiplanar CT image reconstructions of the cervical spine were also generated. COMPARISON:  11/15/2007 FINDINGS: CT HEAD FINDINGS Brain: No evidence of acute infarction, hemorrhage, hydrocephalus, extra-axial collection or mass lesion/mass effect. Chronic atrophic and ischemic changes are noted. Vascular: No hyperdense vessel or unexpected calcification. Skull: Normal. Negative for fracture or focal lesion. Sinuses/Orbits: No acute finding. Other: None. CT CERVICAL SPINE FINDINGS Alignment: Alignment is within normal limits with the exception of degenerative anterolisthesis of C7 on T1. Skull base and vertebrae: 7 cervical segments are noted. Disc space narrowing is noted at C5-6, C6-7 and C7-T1. Significant sclerosis is noted at the C7-T1 interspace. Degenerative anterolisthesis is seen at C7-T1. Associated central canal stenosis is noted at this level. No acute fracture or acute facet abnormality is noted. Facet hypertrophic changes are noted at multiple levels. Soft tissues and spinal canal: No prevertebral fluid or swelling. No visible canal hematoma. Upper chest: Within normal  limits. Other: None IMPRESSION: CT of the head: Chronic atrophic and ischemic changes without acute abnormality. CT of the cervical spine: Multilevel degenerative change without acute abnormality Electronically Signed   By: Inez Catalina M.D.   On: 11/23/2017 18:33   Ct Cervical Spine Wo Contrast  Result Date: 11/23/2017 CLINICAL DATA:  Lethargy following hydrocodone administration EXAM: CT HEAD WITHOUT CONTRAST CT CERVICAL SPINE WITHOUT CONTRAST TECHNIQUE: Multidetector CT imaging of the head and cervical spine was performed following the standard protocol without intravenous contrast. Multiplanar CT image reconstructions of the cervical spine were also generated. COMPARISON:  11/15/2007 FINDINGS: CT HEAD FINDINGS Brain: No evidence of acute infarction, hemorrhage, hydrocephalus, extra-axial collection or mass lesion/mass effect. Chronic atrophic and ischemic changes are noted. Vascular: No hyperdense vessel or unexpected calcification. Skull: Normal. Negative for fracture or focal lesion. Sinuses/Orbits: No acute finding. Other: None. CT CERVICAL SPINE FINDINGS Alignment: Alignment is within normal limits with the exception of degenerative anterolisthesis of C7 on T1. Skull base and vertebrae: 7 cervical segments are noted. Disc space narrowing is noted at C5-6, C6-7 and C7-T1. Significant sclerosis is noted at the C7-T1 interspace. Degenerative anterolisthesis is seen at C7-T1. Associated central canal stenosis is noted at this level. No acute fracture or acute facet abnormality is noted. Facet hypertrophic changes are noted at multiple levels. Soft tissues and spinal canal: No prevertebral fluid or swelling. No visible canal hematoma. Upper chest: Within normal limits. Other: None IMPRESSION: CT of the head: Chronic atrophic and ischemic changes without acute abnormality. CT of the cervical spine: Multilevel degenerative change without acute abnormality Electronically Signed   By: Inez Catalina M.D.   On:  11/23/2017 18:33    EKG: Independently reviewed. Sinus rhythm, non-specific IVCD, LVH with repolarization abnormality.   Assessment/Plan  1. Obtundation  - Presents obtunded after being normal earlier in the day  - She uses narcotic analgesia for chronic pain, has 12 pills missing after filling Norco 10 mg 2 days earlier  - She had a brief and partial improvement in mental status with 1 mg Narcan  - Head CT negative for acute findings  - Basic workup suggests a possible infection (left base consolidation on CXR and mild leukocytosis)  - Tele-neuro recommends CTA H&N, transfer to facility with MRI capability; CTA H&N pending  - Neurologist at [facility] consulted by ED physician and agreed to transfer there where  they will consult  - DDx is broad but includes opiate overdose, toxic-metabolic encephalopathy, ischemic CVA, inflammatory/angiitis  - Continue cardiac monitoring, frequent neuro checks, check AMS labs, follow-up CTA H&N, MRI brain, and neurology recommendations   2. Chronic pain  - Concern for opiate overdose as etiology for presentation with obtundation   - Hold narcotics for now while following clinical course    3. History of PE  - Continue Xarelto   4. Type II DM  - A1c was 6.2% remotely  - Managed at home with metformin and glimepiride, held on admission - Check CBG's and use a SSI with Novolog while in hospital   5. Hx of PE  - Continue Xarelto    6. Hypertension  - BP at goal  - Continue Lopressor    7. Consolidation of left lung base  - Consolidation noted in lingula and left base on CXR, may be chronic but difficult to exclude infection  - There is mild leukocytosis, and without clear explanation for patient's presentation, favor treating  - Cultures collected in ED, will check strep pneumo antigen and procalictonin  - Continue abx while following cultures, trending procalcitonin    8. Sarcoidosis  - Hx of lung and neuro involvement  - Managed with chronic  prednisone, will continue  - Check inflammatory markers given unclear etiology for current state    DVT prophylaxis: Xarelto  Code Status: DNR  Family Communication: Husband and son updated at bedside Consults called: Neurology Admission status: Inpatient    Vianne Bulls, MD [facility] Pager [phone]  If 7PM-7AM, please contact night-coverage [email or web address] Password [facility]  11/23/2017, 8:33 PM

## 2017-11-23 NOTE — Consult Note (Signed)
   [facility] TeleNeurology Consult Services  Date of Service: 11/23/17  Impression:  Acute onset decreased level of arousal - she still has profound stupor. Encephalopathy from drug overdose vs infection/metabolic disease favored, but stroke cannot be ruled out. She is not a candidate for IV tpa as she is on xarelto. Recommend adding CTA/P head/neck.   Of note, she has B LE triple flexion and upgoing toes. However, her husband clearly states this has been present for the past 5 years since she became wheelchair bound. He has no knowledge of a spinal cord injury.   It is likely she will need an MRI of the brain and possibly the remainder of the spine prior to Monday. Discussed with Dr. Thurnell Garbe. Recommend transfer to [facility].  Recommendations:  Tele ASA CTA head/neck Transfer to [facility]. DVT proph - lovenox permissive htn PT/OT/speech bedside swallow eval  ---------------------------------------------------------------------  CC: decreased LOC.  History of Present Illness:  77 yo woman with acute onset decreased level of arousal. She was LSN at approx 0900. She has reported right leg twitching. She is on xarelto for PE. She is wheelchair bound for the past 5 years. 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 He relates it to chronic leg pain. She has loss of of b/b control.   Diagnostic Testing: CT head unremarkable.   Vital Signs:   Vitals:   11/23/17 1808 11/23/17 1830  BP:  (!) 168/75  Pulse:  65  Resp:  (!) 21  Temp: 98.1 F (36.7 C)   SpO2:  98%      Exam:   MS: eyes closed; no response to voice/pain; nonverbal; cannot follow commands.     CN: PEERL, EOMI to head turning - no gaze preference, does not blink to threat, no obvious facial asymmetry; unable to test uvular lift and tongue protrusion or hearing.    MOT: Nl bulk and tone.  Responds to pain minimally in the B UE; she has R LE triple  spontaneous with upgoing toes on the left.     SENS: responds to pain     DTR: 2+ and symmetric in UE/LE; toes down     COOR: unable to test     Gait: unable to test    Medical Decision Making:  - Extensive number of diagnosis or management options are considered above.   - Extensive amount of complex data reviewed.   - High risk of complication and/or morbidity or mortality are associated with differential diagnostic considerations above.  - There may be uncertain outcome and increased probability of prolonged functional impairment or high probability of severe prolonged functional impairment associated with some of these differential diagnosis.   Medical Data Reviewed:  1.Data reviewed include clinical labs, radiology,  Medical Tests;   2.Tests results discussed w/performing or interpreting physician;   3.Obtaining/reviewing old medical records;  4.Obtaining case history from another source;  5.Independent review of image, tracing or specimen.    Patient was informed the Neurology Consult would happen via telehealth (remote video) and consented to receiving care in this manner.

## 2017-11-23 NOTE — ED Notes (Signed)
CRITICAL VALUE ALERT  Critical Value:  Lactic acid 2.1   Date & Time Notied:  11/23/17 1850  Provider Notified: Notified MD  Orders Received/Actions taken: Notified MD

## 2017-11-24 ENCOUNTER — Inpatient Hospital Stay (HOSPITAL_COMMUNITY): Payer: Medicare Other

## 2017-11-24 ENCOUNTER — Other Ambulatory Visit (HOSPITAL_COMMUNITY): Payer: Medicare Other

## 2017-11-24 DIAGNOSIS — I6340 Cerebral infarction due to embolism of unspecified cerebral artery: Secondary | ICD-10-CM

## 2017-11-24 DIAGNOSIS — R4182 Altered mental status, unspecified: Secondary | ICD-10-CM

## 2017-11-24 DIAGNOSIS — I6389 Other cerebral infarction: Secondary | ICD-10-CM

## 2017-11-24 DIAGNOSIS — I639 Cerebral infarction, unspecified: Secondary | ICD-10-CM

## 2017-11-24 DIAGNOSIS — L8990 Pressure ulcer of unspecified site, unspecified stage: Secondary | ICD-10-CM

## 2017-11-24 DIAGNOSIS — I480 Paroxysmal atrial fibrillation: Secondary | ICD-10-CM

## 2017-11-24 DIAGNOSIS — I63441 Cerebral infarction due to embolism of right cerebellar artery: Principal | ICD-10-CM

## 2017-11-24 LAB — GLUCOSE, CAPILLARY
GLUCOSE-CAPILLARY: 109 mg/dL — AB (ref 65–99)
Glucose-Capillary: 74 mg/dL (ref 65–99)
Glucose-Capillary: 101 mg/dL — ABNORMAL HIGH (ref 65–99)
Glucose-Capillary: 97 mg/dL (ref 65–99)
Glucose-Capillary: 118 mg/dL — ABNORMAL HIGH (ref 65–99)
Glucose-Capillary: 88 mg/dL (ref 65–99)

## 2017-11-24 LAB — STREP PNEUMONIAE URINARY ANTIGEN: STREP PNEUMO URINARY ANTIGEN: NEGATIVE

## 2017-11-24 LAB — CBG MONITORING, ED: Glucose-Capillary: 209 mg/dL — ABNORMAL HIGH (ref 65–99)

## 2017-11-24 LAB — MRSA PCR SCREENING: MRSA by PCR: NEGATIVE

## 2017-11-24 MED ORDER — ASPIRIN 300 MG RE SUPP
300.0000 mg | Freq: Every day | RECTAL | Status: DC
  Filled 2017-11-24: qty 1

## 2017-11-24 MED ORDER — ASPIRIN EC 325 MG PO TBEC
325.0000 mg | DELAYED_RELEASE_TABLET | Freq: Every day | ORAL | Status: DC
  Administered 2017-11-24 – 2017-11-26 (×3): 325 mg via ORAL
  Filled 2017-11-24 (×3): qty 1

## 2017-11-24 MED ORDER — ENOXAPARIN SODIUM 40 MG/0.4ML ~~LOC~~ SOLN
40.0000 mg | SUBCUTANEOUS | Status: DC
  Administered 2017-11-24 – 2017-11-25 (×2): 40 mg via SUBCUTANEOUS
  Filled 2017-11-24 (×2): qty 0.4

## 2017-11-24 MED ORDER — ATORVASTATIN CALCIUM 10 MG PO TABS
20.0000 mg | ORAL_TABLET | Freq: Every day | ORAL | Status: DC
  Administered 2017-11-25: 20 mg via ORAL
  Filled 2017-11-24 (×2): qty 2

## 2017-11-24 MED ORDER — VANCOMYCIN HCL IN DEXTROSE 1-5 GM/200ML-% IV SOLN
1000.0000 mg | INTRAVENOUS | Status: DC
  Administered 2017-11-24: 1000 mg via INTRAVENOUS
  Filled 2017-11-24 (×2): qty 200

## 2017-11-24 NOTE — Progress Notes (Signed)
STROKE TEAM PROGRESS NOTE   SUBJECTIVE (INTERVAL HISTORY) Husband and sons are present. The patient appeared lethargic but she was able to follow most commands and was oriented to person and place. She is wheelchair bound at home as per family, however, she typically refused help from family and many times family found her fall at home. She takes care of her own medication and refuse others help. Family does not know whether she takes Xarelto regularly or not. When ask pt, she said she does not know whether she takes Xarelto as she supposed to.    OBJECTIVE Temp:  [97.8 F (36.6 C)-99.3 F (37.4 C)] 98 F (36.7 C) (04/21 1221) Pulse Rate:  [63-78] 70 (04/21 1221) Cardiac Rhythm: Normal sinus rhythm (04/21 0845) Resp:  [14-25] 16 (04/21 1221) BP: (117-174)/(52-115) 165/62 (04/21 1221) SpO2:  [71 %-100 %] 98 % (04/21 1221) Weight:  [119 lb 0.8 oz (54 kg)] 119 lb 0.8 oz (54 kg) (04/21 0358)  CBC:  Recent Labs  Lab 11/23/17 1758  WBC 11.7*  NEUTROABS 9.7*  HGB 11.5*  HCT 36.7  MCV 98.1  PLT 269    Basic Metabolic Panel:  Recent Labs  Lab 11/23/17 1758  NA 139  K 3.6  CL 105  CO2 26  GLUCOSE 172*  BUN 17  CREATININE 0.62  CALCIUM 8.7*    Lipid Panel:     Component Value Date/Time   CHOL 176 10/14/2017 1602   CHOL 165 12/31/2012 1210   TRIG 237 (H) 10/14/2017 1602   TRIG 169 (H) 12/17/2014 1054   TRIG 183 (H) 12/31/2012 1210   HDL 41 10/14/2017 1602   HDL 49 12/17/2014 1054   HDL 50 12/31/2012 1210   CHOLHDL 4.3 10/14/2017 1602   LDLCALC 88 10/14/2017 1602   LDLCALC 86 04/28/2014 0924   LDLCALC 78 12/31/2012 1210   HgbA1c:  Lab Results  Component Value Date   HGBA1C 6.2 09/20/2015   Urine Drug Screen:     Component Value Date/Time   LABOPIA POSITIVE (A) 11/23/2017 1740   COCAINSCRNUR NONE DETECTED 11/23/2017 1740   LABBENZ NONE DETECTED 11/23/2017 1740   AMPHETMU NONE DETECTED 11/23/2017 1740   THCU NONE DETECTED 11/23/2017 1740   LABBARB NONE  DETECTED 11/23/2017 1740    Alcohol Level     Component Value Date/Time   ETH <10 11/23/2017 1758    IMAGING I have personally reviewed the radiological images below and agree with the radiology interpretations.  Ct Angio Head W/cm &/or Wo Cm Ct Angio Neck W And/or Wo Contrast Ct Cerebral Perfusion W Contrast 11/23/2017 IMPRESSION:   CT head:  1. 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 No hemorrhage or associated mass effect.  2. Stable supratentorial chronic microvascular ischemic changes and parenchymal volume loss of the brain.   CTA neck:  1. Patent carotid and vertebral arteries. No dissection, aneurysm, or hemodynamically significant stenosis utilizing NASCET criteria.  2. Severe cervical spondylosis at the C6-T1 levels.   CTA head:  1. Right SCA occlusion.  2. Tandem segments of severe stenosis of right PCA.  3. Otherwise no large vessel occlusion, aneurysm, or significant stenosis is identified.   CT perfusion:  Perfusion anomaly in the right SCA distribution. The area of infarction on noncontrast CT of head is larger than CBF (<30%) Volume indicating pseudonormalization of a region of the core infarction.   Ct Head Wo Contrast 11/23/2017 IMPRESSION:  Chronic atrophic and ischemic changes without acute abnormality.  Mr Brain Wo Contrast 11/24/2017 IMPRESSION:  1. 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  2. No hemorrhage or mass effect.  3. Chronic ischemic microangiopathy.   Dg Chest 1 View 11/23/2017 IMPRESSION:  1. Mild left pleural effusion or thickening. Airspace disease at the lingula and left base, some of this may be chronic but difficult to exclude an acute infiltrate in the region  2. Cardiomegaly with mild vascular congestion   CT of the cervical spine:  11/23/2017 IMPRESSION: Multilevel degenerative change without acute  abnormality   Transthoracic Echocardiogram - pending 00/00/00    PHYSICAL EXAM Vitals:   11/24/17 0358 11/24/17 0457 11/24/17 0803 11/24/17 1221  BP: (!) 133/52  131/73 (!) 165/62  Pulse: 67  63 70  Resp:   17 16  Temp:  97.8 F (36.6 C) 97.9 F (36.6 C) 98 F (36.7 C)  TempSrc: Oral Oral Oral Oral  SpO2: 100%  97% 98%  Weight: 119 lb 0.8 oz (54 kg)       General - Well nourished, well developed, lethargic and sleepy  Ophthalmologic - Fundi not visualized due to noncooperation.  Cardiovascular - Regular rate and rhythm, not in afib rhythm.  Neuro - drowsy sleepy and lethargic, able to open eyes on voice. Able to follow limited simple commands. Orientated to place, situation and self, not orientated to time, age or people. Severe dysarthria but able to name and repeat, paucity of speech. PERRL, right gaze incomplete. Visual field grossly full. No significant facial weakness, tongue midline. BUE 3/5, against gravity, BLE proximal 0/5, but distally LLE 3/5 DF and PF, RLE 3/5 PF but 0/5 DF. DTR diminished and babinski negative. Sensation symmetrical, coordination BLEs not cooperative, but BUE ataxic, with R>L. Gait not tested.    ASSESSMENT/PLAN Ms. Elizabeth Drake is a 77 y.o. female with history of atrial fibrillation on Xarelto, chronic pain, diabetes mellitus, previous DVT, hypertension, history of pulmonary emboli, sarcoidosis, and history of vertigo presenting with lethargy and posturing. She did not receive IV t-PA due to anticoagulation.  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  Resultant - lethargy, b/l UE ataxia  CT head - Right SCA territory infarction involving right superior cerebellum and hemi pons.  MRI head - Acute infarct of the superior right cerebellum and right dorso lateral pons, in the superior cerebellar artery territory.   CTA H&N -  Rt SCA occlusion. Tandem segments of severe  stenosis of Rt PCA.   2D Echo - pending  LDL - pending  HgbA1c - pending  VTE prophylaxis - lovenox Fall precautions Diet NPO time specified  Xarelto (rivaroxaban) daily prior to admission, now on aspirin 325 mg daily. Resume Xarelto in 7 days post stroke to avoid hemorrhagic conversion.  Patient counseled to be compliant with her antithrombotic medications  Ongoing aggressive stroke risk factor management  Therapy recommendations:  pending  Disposition:  Pending  afib not compliant with AC  Currently not in afib  Rate controlled  Not compliant with AC and pt needs supervision on meds at home  Consider to resume Xarelto in 7 days post stroke  Hypertension  Stable . Permissive hypertension (OK if < 220/120) but gradually normalize in 5-7 days . Long-term BP goal normotensive  Hyperlipidemia  Lipid lowering medication PTA:  none  LDL pending, goal < 70  Current lipid lowering medication: put on Lipitor 20 mg daily  Continue statin at discharge  Diabetes  HgbA1c pending, goal < 7.0  Controlled  SSI  CBG monitoring  Dysphagia  NPO for now due to lethargy   Speech to follow  Other Stroke Risk Factors  Advanced age  Former cigarette smoker - quit 16 years ago  Hx of DVT and PE - on Xarelto at home but noncompliant   Other Active Problems  Community acquired pneumonia - Levaquin / vancomycin  Sarcoidosis - on chronic prednisone  Hospital day # 1  Rosalin Hawking, MD PhD Stroke Neurology 11/24/2017 4:34 PM    To contact Stroke Continuity provider, please refer to [email or web address] After hours, contact General Neurology

## 2017-11-24 NOTE — Progress Notes (Signed)
John in [place] called and pt will be next

## 2017-11-24 NOTE — ED Notes (Signed)
Report to floor RN Sharyn Lull

## 2017-11-24 NOTE — ED Notes (Signed)
Pt spontaneously moved all extremities. Right side responds to minimal touch . Left to painful stimuli. Pt had BM. Cleaned and turned racing right side.

## 2017-11-24 NOTE — Progress Notes (Signed)
ANTIBIOTIC CONSULT NOTE - INITIAL  Pharmacy Consult for Vancomycin Indication: PNA  Allergies  Allergen Reactions  . Advil [Ibuprofen] Shortness Of Breath and Swelling  . Propoxyphene Swelling  . Azo [Phenazopyridine] Other (See Comments)    Unknown reaction  . Indocin [Indomethacin] Swelling and Other (See Comments)    Unknown reaction  . Keflex [Cephalexin] Other (See Comments)  . Loratadine Other (See Comments)    unknown  . Percocet [Oxycodone-Acetaminophen] Itching    10/12/15 Patient denies allergy to APAP    Patient Measurements: Weight: 119 lb 0.8 oz (54 kg)   Vital Signs: Temp: 98 F (36.7 C) (04/21 1221) Temp Source: Oral (04/21 1221) BP: 165/62 (04/21 1221) Pulse Rate: 70 (04/21 1221) Intake/Output from previous day: 04/20 0701 - 04/21 0700 In: 696.7 [I.V.:546.7; IV Piggyback:150] Out: 1000 [Urine:1000] Intake/Output from this shift: No intake/output data recorded.  Labs: Recent Labs    11/23/17 1758  WBC 11.7*  HGB 11.5*  PLT 218  CREATININE 0.62   CrCl cannot be calculated (Unknown ideal weight.). No results for input(s): VANCOTROUGH, VANCOPEAK, VANCORANDOM, GENTTROUGH, GENTPEAK, GENTRANDOM, TOBRATROUGH, TOBRAPEAK, TOBRARND, AMIKACINPEAK, AMIKACINTROU, AMIKACIN in the last 72 hours.   Microbiology: Recent Results (from the past 720 hour(s))  Culture, blood (routine x 2)     Status: None (Preliminary result)   Collection Time: 11/23/17  7:10 PM  Result Value Ref Range Status   Specimen Description BLOOD LEFT WRIST  Final   Special Requests   Final    BOTTLES DRAWN AEROBIC ONLY Blood Culture results may not be optimal due to an inadequate volume of blood received in culture bottles DRAWN BY RN   Culture   Final    NO GROWTH < 12 HOURS Performed at [facility], [address] [place], [place] [address]    Report Status PENDING  Incomplete  Culture, blood (routine x 2)     Status: None (Preliminary result)   Collection Time: 11/23/17  7:44 PM   Result Value Ref Range Status   Specimen Description BLOOD LEFT ARM  Final   Special Requests   Final    BOTTLES DRAWN AEROBIC ONLY Blood Culture results may not be optimal due to an inadequate volume of blood received in culture bottles   Culture   Final    NO GROWTH < 12 HOURS Performed at [facility], [address] [place], [place] [address]    Report Status PENDING  Incomplete    Medical History: Past Medical History:  Diagnosis Date  . Arthritis   . Atrial fibrillation ([place])   . Atrial fibrillation with RVR ([place]) 09/13/2014  . Chronic pain   . Diabetes mellitus without complication ([place])   . DVT (deep venous thrombosis) ([place])   . Hiatal hernia   . Hypertension   . Osteoporosis   . Pulmonary emboli ([place])   . Sarcoidosis   . Vertigo   . Wheelchair bound      Assessment: 77 yo female admitted and being treated for presumed PNA with vancomycin and levofloxacin. Pharmacy has been asked to dose vancomycin. Patient est CrCL ~60 ml/min  Goal of Therapy:  Troughs of 15-20 mcg/ml  Plan:  Continue vancomycin 1000mg  IV q24h starting tonight (24 hour after last dose) VT as needed Will monitor fever curve (afebrile), CBC, and LOT  Stefanny Pieri A. Levada Dy, PharmD, [place] Pager: [phone]  11/24/2017,12:29 PM

## 2017-11-24 NOTE — ED Notes (Signed)
Report to [facility].  

## 2017-11-24 NOTE — Progress Notes (Signed)
Sent [facility] message of new admit

## 2017-11-24 NOTE — ED Notes (Signed)
Pt husband-- Rosezella [place]") phone number--- [phone] // [phone] Please call for updates.

## 2017-11-24 NOTE — Plan of Care (Signed)
Patient dependent for ADLS at this time.

## 2017-11-24 NOTE — Consult Note (Signed)
Neurology Consultation Reason for Consult: Stroke Referring Physician: Opyd, T  CC: Stroke  History is obtained from: Chart review  HPI: Elizabeth Drake is a 77 y.o. female who was last seen normal around 9 AM on 4/20.  She subsequently was found to be lethargic which was initially thought to be due to recurrent.  After exam on arrival to [facility] ED, however, the ED physician noticed that she appeared to be posturing and therefore activated tele-stroke.  She had a repeat CT/CTA/CTP which demonstrated infarct present on CT with pseudonormalization on CVP.  She was therefore transferred to [facility] for further evaluation   LKW: 9 AM 4/20 tpa given?: no, anticoagulated Modified Rankin scale: Unable to assess given  That family is not present.  ROS:  Unable to obtain due to altered mental status.   Past Medical History:  Diagnosis Date  . Arthritis   . Atrial fibrillation ([place])   . Atrial fibrillation with RVR ([place]) 09/13/2014  . Chronic pain   . Diabetes mellitus without complication ([place])   . DVT (deep venous thrombosis) ([place])   . Hiatal hernia   . Hypertension   . Osteoporosis   . Pulmonary emboli ([place])   . Sarcoidosis   . Vertigo   . Wheelchair bound      Family History  Problem Relation Age of Onset  . Heart disease Mother   . Hypertension Mother   . Cancer Father        pancratic  . Cancer - Other Sister      Social History:  reports that she quit smoking about 16 years ago. Her smoking use included cigarettes. She has never used smokeless tobacco. She reports that she does not drink alcohol or use drugs.   Exam: Current vital signs: BP (!) 133/52 (BP Location: Left Arm) Comment: noted  Pulse 67   Temp 97.8 F (36.6 C) (Oral)   Resp 19   Wt 54 kg (119 lb 0.8 oz)   SpO2 100%   BMI 19.21 kg/m  Vital signs in last 24 hours: Temp:  [97.8 F (36.6 C)-99.3 F (37.4 C)] 97.8 F (36.6 C) (04/21 0457) Pulse Rate:  [65-78] 67 (04/21 0358) Resp:  [14-25] 19  (04/21 0332) BP: (117-174)/(52-115) 133/52 (04/21 0358) SpO2:  [71 %-100 %] 100 % (04/21 0358) Weight:  [54 kg (119 lb 0.8 oz)] 54 kg (119 lb 0.8 oz) (04/21 0358)   Physical Exam  Constitutional: Appears elderly Psych: Affect appropriate to situation Eyes: No scleral injection HENT: No OP obstrucion Head: Normocephalic.  Cardiovascular: Normal rate and regular rhythm.  Respiratory: Effort normal, non-labored breathing GI: Soft.  No distension. There is no tenderness.  Skin: WDI  Neuro: Mental Status: Patient is arousable with significant noxious stimulation, however once aroused she is able to tell me her name and follow some commands. Cranial Nerves: II: She does not blink to threat from either direction.  Left pupil 5 mm, right pupil 3 mm, both are reactive III,IV, VI: She crosses midline in both directions once aroused  V: VII: She blinks to I would stimulation bilaterally, though more briskly on the right, she also has a flattened nasolabial fold on the left VIII: hearing is intact to voice X: No clear palatal movement when saying "ah" Motor: She appears to have a left hemiparesis, moving the side significantly less than the right but I do see her voluntarily lifted and grabbed the bed rail next to her.  When noxious stimulus is applied she  appears to extensor posture in the left arm and flexion versus withdrawal of the left leg  sensory: She does respond to noxious stimulation in all 4 extremities, however less so on the left than the right  Plantars: Toes are downgoing on the right, upgoing on the left Cerebellar: She is grossly ataxic when moving her right arm.  Does not move her left arm enough to judge.   I have reviewed labs in epic and the results pertinent to this consultation are: CMP-unremarkable  I have reviewed the images obtained: CTA/CTP- SCA infarct on the right  Impression: 77 year old female with SCA infarct.  This could be either embolic or thrombotic.   Unclear to me if she has missed any doses of Xarelto, but a normal INR is certainly suggestive of this.  Recommendations: 1. HgbA1c, fasting lipid panel 2. MRIof the brain without contrast 3. Frequent neuro checks 4. Echocardiogram 5. Carotid dopplers are not needed given CTA 6. Prophylactic therapy-she is allergic to many NSAIDs, would clarify with family she tolerates aspirin once they arrive.  If so would start aspirin suppository  Anticoagulation for now  7. Risk factor modification 8. Telemetry monitoring  9.  N.p.o. pending speech evaluation 10.  PT, OT 11. please page stroke NP  Or  PA  Or MD  from 8am -4 pm as this patient will be followed by the stroke team at this point.   You can look them up on [email or web address]      Roland Rack, MD [facility] [phone]  If 7pm- 7am, please page neurology on call as listed in [place].

## 2017-11-24 NOTE — ED Notes (Signed)
Called husband to give update on transfer

## 2017-11-24 NOTE — Progress Notes (Signed)
SLP Cancellation Note  Patient Details Name: MAISA BEDINGFIELD MRN: [id number] DOB: 09/26/1940   Cancelled treatment:       Reason Eval/Treat Not Completed: Fatigue/lethargy limiting ability to participate. RN present; pt responding to pain but not maintaining arousal or following commands. Will follow up next date.  Deneise Lever, [place], [place] Speech-Language Pathologist [phone]   Aliene Altes 11/24/2017, 3:52 PM

## 2017-11-24 NOTE — Progress Notes (Signed)
PROGRESS NOTE  LIANE TRIBBEY  BTD:[id number] DOB: Dec 15, 1940 DOA: 11/23/2017 PCP: Chevis Pretty, FNP   Brief Narrative: Elizabeth Drake is a 77 y.o. female with a history of HTN, T2DM, chronic pain on opioids, PE on xarelto, and sarcoidosis on prednisone chronically who presented to [facility] ED poorly responsive. Her husband and son provided the history. She had reportedly been in her usual state earlier in the day, was not complaining of anything this morning, and seemed normal when her husband went outside for approximately 1 hour.  Upon returning, patient was sleeping in her chair and difficult to arouse.  EMS was called, 1 mg of IV Narcan was given, patient woke up some but without any verbal response.  There has not been any recent fall or trauma reported and the patient is not known to use alcohol.  She filled Norco 10 mg on 11/21/2017 and has 12 pills missing.  Upon arrival to the ED, patient is found to be afebrile, saturating 80s on room air. CXR showed cardiomegaly with vascular congestion and lingular/LLL infiltrate (acute vs. chronic) with effusion. UDS +opioids only, negative ethanol, etc. Lactate mildly elevated and WBC 11.7k. CT head showed acute infarcts in right SCA distribution on diffusion imaging, so she was transferred to [facility] for stroke work up/neurology evaluation.  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 chronic. Unable to accurately elicit symptoms from patient. Has mild leukocytosis but is on chronic prednisone. No fever. Reportedly was hypoxic, though has continued to be 100% SpO2 here.  - Received ciprofloxacin AND levofloxacin, vancomycin thus far. Has unknown allergy to keflex, though it seems she's had ceftriaxone in the past. Check MRSA PCR. Cipro not continued.  - PCT - Urine strep, legionella Ag's - Monitor blood cultures  UTI: >100k GNR's on urine culture. Unable to elicit sxs.  - Monitor culture for speciation/susceptibilities. Has received broad coverage to date.   Acute hypoxic respiratory failure  Vascular congestion  History of PE:  - Continue xarelto  Sarcoidosis: Hx of lung and neuro involvement.  - Continue chronic prednisone, no indication for stress dosing.   T2DM:  - Holding home oral medications - SSI  - Update HbA1c as above (last was only 6.2% in 2017)  HTN:  - Will allow permissive HTN, so hold metoprolol.   Chronic pain:  - Holding narcotics for concern of overdose (had brief improvement with narcan) in addition to CVA.   DVT prophylaxis: Xarelto Code Status: DNR Family Communication: None at bedside this AM Disposition Plan: Uncertain, suspect SNF. CSW consulted.   Consultants:   Neurology  Procedures:   Echocardiogram ordered  Antimicrobials:  Ciprofloxacin 4/20  Levofloxacin 4/20 >>   Vancomycin 4/20 >>   Subjective: Speaks but limited by lethargy. Follows commands.   Objective: Vitals:   11/24/17 0358 11/24/17 0457 11/24/17 0803 11/24/17 1221  BP: (!) 133/52  131/73 (!) 165/62  Pulse: 67  63 70  Resp:   17 16  Temp:  97.8 F (36.6 C) 97.9 F (36.6 C) 98 F (36.7 C)  TempSrc: Oral Oral Oral Oral  SpO2: 100%  97% 98%  Weight: 54 kg (119 lb 0.8 oz)       Intake/Output Summary (Last 24 hours) at 11/24/2017 1413 Last data filed at 11/24/2017 1316 Gross per 24 hour  Intake 696.67 ml    Output 1700 ml  Net -1003.33 ml   Filed Weights   11/24/17 0358  Weight: 54 kg (119 lb 0.8 oz)    Gen: Elderly female in no distress Pulm: Non-labored breathing with supplemental oxygen. Clear to auscultation bilaterally.  CV: Regular rate and rhythm. No murmur, rub, or gallop. No JVD, no pedal edema. GI: Abdomen soft, non-tender, non-distended, with normoactive bowel sounds. No organomegaly or masses felt. Ext: Warm, no deformities Skin: No rashes, lesions or ulcers Neuro: Drowsy but rousable, withdraws to noxious stimulus x4 extremities. Has significant ataxia in upper extremities with weakness.  Psych: UTD  Data Reviewed: I have personally reviewed following labs and imaging studies  CBC: Recent Labs  Lab 11/23/17 1758  WBC 11.7*  NEUTROABS 9.7*  HGB 11.5*  HCT 36.7  MCV 98.1  PLT 101   Basic Metabolic Panel: Recent Labs  Lab 11/23/17 1758  NA 139  K 3.6  CL 105  CO2 26  GLUCOSE 172*  BUN 17  CREATININE 0.62  CALCIUM 8.7*   GFR: CrCl cannot be calculated (Unknown ideal weight.). Liver Function Tests: Recent Labs  Lab 11/23/17 1758  AST 19  ALT 11*  ALKPHOS 69  BILITOT 0.4  PROT 8.4*  ALBUMIN 3.4*   No results for input(s): LIPASE, AMYLASE in the last 168 hours. No results for input(s): AMMONIA in the last 168 hours. Coagulation Profile: Recent Labs  Lab 11/23/17 1758  INR 0.99   Cardiac Enzymes: Recent Labs  Lab 11/23/17 1758  TROPONINI <0.03   BNP (last 3 results) No results for input(s): PROBNP in the last 8760 hours. HbA1C: No results for input(s): HGBA1C in the last 72 hours. CBG: Recent Labs  Lab 11/23/17 1745 11/23/17 2108 11/24/17 0017 11/24/17 0802 11/24/17 1124  GLUCAP 157* 175* 209* 74 101*   Lipid Profile: No results for input(s): CHOL, HDL, LDLCALC, TRIG, CHOLHDL, LDLDIRECT in the last 72 hours. Thyroid Function Tests: No results for input(s): TSH, T4TOTAL, FREET4, T3FREE, THYROIDAB in the last 72 hours. Anemia  Panel: No results for input(s): VITAMINB12, FOLATE, FERRITIN, TIBC, IRON, RETICCTPCT in the last 72 hours. Urine analysis:    Component Value Date/Time   COLORURINE YELLOW 11/23/2017 1740   APPEARANCEUR HAZY (A) 11/23/2017 1740   LABSPEC 1.013 11/23/2017 1740   PHURINE 6.0 11/23/2017 1740   GLUCOSEU 50 (A) 11/23/2017 1740   HGBUR MODERATE (A) 11/23/2017 1740   BILIRUBINUR NEGATIVE 11/23/2017 1740   BILIRUBINUR neg 11/27/2014 1053   KETONESUR NEGATIVE 11/23/2017 1740   PROTEINUR NEGATIVE 11/23/2017 1740   UROBILINOGEN negative 11/27/2014 1053   UROBILINOGEN 1.0 09/13/2014 1845   NITRITE POSITIVE (A) 11/23/2017 1740   LEUKOCYTESUR NEGATIVE 11/23/2017 1740   Recent Results (from the past 240 hour(s))  Culture, blood (routine x 2)     Status: None (Preliminary result)   Collection Time: 11/23/17  7:10 PM  Result Value Ref Range Status   Specimen Description BLOOD LEFT WRIST  Final   Special Requests   Final    BOTTLES DRAWN AEROBIC ONLY Blood Culture results may  not be optimal due to an inadequate volume of blood received in culture bottles DRAWN BY RN   Culture   Final    NO GROWTH < 12 HOURS Performed at [facility], [address] [place], [place] [address]    Report Status PENDING  Incomplete  Culture, blood (routine x 2)     Status: None (Preliminary result)   Collection Time: 11/23/17  7:44 PM  Result Value Ref Range Status   Specimen Description BLOOD LEFT ARM  Final   Special Requests   Final    BOTTLES DRAWN AEROBIC ONLY Blood Culture results may not be optimal due to an inadequate volume of blood received in culture bottles   Culture   Final    NO GROWTH < 12 HOURS Performed at [facility], [address] [place], [place] [address]    Report Status PENDING  Incomplete      Radiology Studies: Ct Angio Head W/cm &/or Wo Cm  Result Date: 11/23/2017 CLINICAL DATA:  77 y/o  F; altered mental status of unclear cause. EXAM: CT ANGIOGRAPHY HEAD AND NECK CT PERFUSION  BRAIN TECHNIQUE: Multidetector CT imaging of the head and neck was performed using the standard protocol during bolus administration of intravenous contrast. Multiplanar CT image reconstructions and MIPs were obtained to evaluate the vascular anatomy. Carotid stenosis measurements (when applicable) are obtained utilizing NASCET criteria, using the distal internal carotid diameter as the denominator. Multiphase CT imaging of the brain was performed following IV bolus contrast injection. Subsequent parametric perfusion maps were calculated using RAPID software. CONTRAST:  176mL ISOVUE-370 IOPAMIDOL (ISOVUE-370) INJECTION 76% COMPARISON:  11/23/2017 CT head.  12/09/2004 MRI head. FINDINGS: CT HEAD FINDINGS Brain: Region of hypoattenuation within the right superior cerebellar hemisphere and extending into the right hemi pons compatible with late acute to subacute infarction (series 5, image 3 and series 3, image 12). No associated hemorrhage or mass effect. No additional region of acute infarction, hemorrhage, or mass effect in the brain identified. Stable background of chronic microvascular ischemic changes and parenchymal volume loss of the supratentorial brain. Several small chronic lacunar infarcts within basal ganglia. Vascular: As below. Skull: Normal. Negative for fracture or focal lesion. Sinuses/Orbits: No acute finding. Other: None. ASPECTS ([place] Stroke Program Early CT Score) - Ganglionic level infarction (caudate, lentiform nuclei, internal capsule, insula, M1-M3 cortex): 7 - Supraganglionic infarction (M4-M6 cortex): 3 Total score (0-10 with 10 being normal): 10 Review of the MIP images confirms the above findings CTA NECK FINDINGS Aortic arch: Standard branching. Imaged portion shows no evidence of aneurysm or dissection. No significant stenosis of the major arch vessel origins. Mild calcific atherosclerosis. Right carotid system: No evidence of dissection, stenosis (50% or greater) or occlusion. Mild  non stenotic calcific atherosclerosis of the carotid bifurcation. Left carotid system: No evidence of dissection, stenosis (50% or greater) or occlusion. Mild non stenotic calcific atherosclerosis of the carotid bifurcation. Vertebral arteries: Left dominant. No evidence of dissection, stenosis (50% or greater) or occlusion. Skeleton: C6-T1 severe erosive discogenic degenerative changes with endplate eburnation and grade 1 anterolisthesis at the C7-T1 level with there is severe facet arthritis. Bones are demineralized. Other neck: Multiple thyroid nodules measuring up to 9 mm on the left. Upper chest: Negative. Review of the MIP images confirms the above findings CTA HEAD FINDINGS Anterior circulation: No significant stenosis, proximal occlusion, aneurysm, or vascular malformation. The right ICA is larger than the left ICA likely due to variant anatomy where bilateral ACA are perfused by the right  A1. Posterior circulation: Left P1 short segment of mild stenosis and right P1/P2 tandem segments of severe stenosis. Irregularity and mild stenosis of the left SCA origin. The right SCA attenuates in caliber early and appears occluded (series 12, image 120 and series 15, image 26). Venous sinuses: As permitted by contrast timing, patent. Anatomic variants: Anterior communicating artery and right posterior communicating artery. No left posterior communicating artery identified, likely hypoplastic or absent. Review of the MIP images confirms the above findings. CT Brain Perfusion Findings: CBF (<30%) Volume: 63mL Perfusion (Tmax>6.0s) volume: 75mL Mismatch Volume: 61mL Infarction Location:Right hemi pons and superior cerebellar hemisphere infarction. The area of infarction on noncontrast CT of head is larger than CBF (<30%) Volume indicating pseudonormalization of a region of the core infarction. IMPRESSION: CT head: 1. 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 No hemorrhage or  associated mass effect. 2. Stable supratentorial chronic microvascular ischemic changes and parenchymal volume loss of the brain. CTA neck: 1. Patent carotid and vertebral arteries. No dissection, aneurysm, or hemodynamically significant stenosis utilizing NASCET criteria. 2. Severe cervical spondylosis at the C6-T1 levels. CTA head: 1. Right SCA occlusion. 2. Tandem segments of severe stenosis of right PCA. 3. Otherwise no large vessel occlusion, aneurysm, or significant stenosis is identified. CT brain perfusion: Perfusion anomaly in the right SCA distribution. The area of infarction on noncontrast CT of head is larger than CBF (<30%) Volume indicating pseudonormalization of a region of the core infarction. These results were called by telephone at the time of interpretation on 11/23/2017 at 9:03 pm to Dr. Francine Graven , who verbally acknowledged these results. Electronically Signed   By: Kristine Garbe M.D.   On: 11/23/2017 21:07   Dg Chest 1 View  Result Date: 11/23/2017 CLINICAL DATA:  Altered mental status EXAM: CHEST  1 VIEW COMPARISON:  09/13/2014, 08/13/2014 FINDINGS: Mildly low lung volume. Small left pleural effusion with dense airspace disease at the lingula and left base. Cardiomegaly with aortic atherosclerosis. Minimal vascular congestion. No pneumothorax. IMPRESSION: 1. Mild left pleural effusion or thickening. Airspace disease at the lingula and left base, some of this may be chronic but difficult to exclude an acute infiltrate in the region 2. Cardiomegaly with mild vascular congestion Electronically Signed   By: Donavan Foil M.D.   On: 11/23/2017 18:29   Ct Head Wo Contrast  Result Date: 11/23/2017 CLINICAL DATA:  Lethargy following hydrocodone administration EXAM: CT HEAD WITHOUT CONTRAST CT CERVICAL SPINE WITHOUT CONTRAST TECHNIQUE: Multidetector CT imaging of the head and cervical spine was performed following the standard protocol without intravenous contrast. Multiplanar  CT image reconstructions of the cervical spine were also generated. COMPARISON:  11/15/2007 FINDINGS: CT HEAD FINDINGS Brain: No evidence of acute infarction, hemorrhage, hydrocephalus, extra-axial collection or mass lesion/mass effect. Chronic atrophic and ischemic changes are noted. Vascular: No hyperdense vessel or unexpected calcification. Skull: Normal. Negative for fracture or focal lesion. Sinuses/Orbits: No acute finding. Other: None. CT CERVICAL SPINE FINDINGS Alignment: Alignment is within normal limits with the exception of degenerative anterolisthesis of C7 on T1. Skull base and vertebrae: 7 cervical segments are noted. Disc space narrowing is noted at C5-6, C6-7 and C7-T1. Significant sclerosis is noted at the C7-T1 interspace. Degenerative anterolisthesis is seen at C7-T1. Associated central canal stenosis is noted at this level. No acute fracture or acute facet abnormality is noted. Facet hypertrophic changes are noted at multiple levels. Soft tissues and spinal canal: No prevertebral fluid or swelling. No visible canal hematoma. Upper  chest: Within normal limits. Other: None IMPRESSION: CT of the head: Chronic atrophic and ischemic changes without acute abnormality. CT of the cervical spine: Multilevel degenerative change without acute abnormality Electronically Signed   By: Inez Catalina M.D.   On: 11/23/2017 18:33   Ct Angio Neck W And/or Wo Contrast  Result Date: 11/23/2017 CLINICAL DATA:  77 y/o  F; altered mental status of unclear cause. EXAM: CT ANGIOGRAPHY HEAD AND NECK CT PERFUSION BRAIN TECHNIQUE: Multidetector CT imaging of the head and neck was performed using the standard protocol during bolus administration of intravenous contrast. Multiplanar CT image reconstructions and MIPs were obtained to evaluate the vascular anatomy. Carotid stenosis measurements (when applicable) are obtained utilizing NASCET criteria, using the distal internal carotid diameter as the denominator. Multiphase  CT imaging of the brain was performed following IV bolus contrast injection. Subsequent parametric perfusion maps were calculated using RAPID software. CONTRAST:  179mL ISOVUE-370 IOPAMIDOL (ISOVUE-370) INJECTION 76% COMPARISON:  11/23/2017 CT head.  12/09/2004 MRI head. FINDINGS: CT HEAD FINDINGS Brain: Region of hypoattenuation within the right superior cerebellar hemisphere and extending into the right hemi pons compatible with late acute to subacute infarction (series 5, image 60 and series 3, image 12). No associated hemorrhage or mass effect. No additional region of acute infarction, hemorrhage, or mass effect in the brain identified. Stable background of chronic microvascular ischemic changes and parenchymal volume loss of the supratentorial brain. Several small chronic lacunar infarcts within basal ganglia. Vascular: As below. Skull: Normal. Negative for fracture or focal lesion. Sinuses/Orbits: No acute finding. Other: None. ASPECTS ([place] Stroke Program Early CT Score) - Ganglionic level infarction (caudate, lentiform nuclei, internal capsule, insula, M1-M3 cortex): 7 - Supraganglionic infarction (M4-M6 cortex): 3 Total score (0-10 with 10 being normal): 10 Review of the MIP images confirms the above findings CTA NECK FINDINGS Aortic arch: Standard branching. Imaged portion shows no evidence of aneurysm or dissection. No significant stenosis of the major arch vessel origins. Mild calcific atherosclerosis. Right carotid system: No evidence of dissection, stenosis (50% or greater) or occlusion. Mild non stenotic calcific atherosclerosis of the carotid bifurcation. Left carotid system: No evidence of dissection, stenosis (50% or greater) or occlusion. Mild non stenotic calcific atherosclerosis of the carotid bifurcation. Vertebral arteries: Left dominant. No evidence of dissection, stenosis (50% or greater) or occlusion. Skeleton: C6-T1 severe erosive discogenic degenerative changes with endplate eburnation  and grade 1 anterolisthesis at the C7-T1 level with there is severe facet arthritis. Bones are demineralized. Other neck: Multiple thyroid nodules measuring up to 9 mm on the left. Upper chest: Negative. Review of the MIP images confirms the above findings CTA HEAD FINDINGS Anterior circulation: No significant stenosis, proximal occlusion, aneurysm, or vascular malformation. The right ICA is larger than the left ICA likely due to variant anatomy where bilateral ACA are perfused by the right A1. Posterior circulation: Left P1 short segment of mild stenosis and right P1/P2 tandem segments of severe stenosis. Irregularity and mild stenosis of the left SCA origin. The right SCA attenuates in caliber early and appears occluded (series 12, image 120 and series 15, image 26). Venous sinuses: As permitted by contrast timing, patent. Anatomic variants: Anterior communicating artery and right posterior communicating artery. No left posterior communicating artery identified, likely hypoplastic or absent. Review of the MIP images confirms the above findings. CT Brain Perfusion Findings: CBF (<30%) Volume: 87mL Perfusion (Tmax>6.0s) volume: 44mL Mismatch Volume: 79mL Infarction Location:Right hemi pons and superior cerebellar hemisphere infarction. The area of infarction on noncontrast  CT of head is larger than CBF (<30%) Volume indicating pseudonormalization of a region of the core infarction. IMPRESSION: CT head: 1. 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 No hemorrhage or associated mass effect. 2. Stable supratentorial chronic microvascular ischemic changes and parenchymal volume loss of the brain. CTA neck: 1. Patent carotid and vertebral arteries. No dissection, aneurysm, or hemodynamically significant stenosis utilizing NASCET criteria. 2. Severe cervical spondylosis at the C6-T1 levels. CTA head: 1. Right SCA occlusion. 2. Tandem segments of severe stenosis of right PCA. 3.  Otherwise no large vessel occlusion, aneurysm, or significant stenosis is identified. CT brain perfusion: Perfusion anomaly in the right SCA distribution. The area of infarction on noncontrast CT of head is larger than CBF (<30%) Volume indicating pseudonormalization of a region of the core infarction. These results were called by telephone at the time of interpretation on 11/23/2017 at 9:03 pm to Dr. Francine Graven , who verbally acknowledged these results. Electronically Signed   By: Kristine Garbe M.D.   On: 11/23/2017 21:07   Ct Cervical Spine Wo Contrast  Result Date: 11/23/2017 CLINICAL DATA:  Lethargy following hydrocodone administration EXAM: CT HEAD WITHOUT CONTRAST CT CERVICAL SPINE WITHOUT CONTRAST TECHNIQUE: Multidetector CT imaging of the head and cervical spine was performed following the standard protocol without intravenous contrast. Multiplanar CT image reconstructions of the cervical spine were also generated. COMPARISON:  11/15/2007 FINDINGS: CT HEAD FINDINGS Brain: No evidence of acute infarction, hemorrhage, hydrocephalus, extra-axial collection or mass lesion/mass effect. Chronic atrophic and ischemic changes are noted. Vascular: No hyperdense vessel or unexpected calcification. Skull: Normal. Negative for fracture or focal lesion. Sinuses/Orbits: No acute finding. Other: None. CT CERVICAL SPINE FINDINGS Alignment: Alignment is within normal limits with the exception of degenerative anterolisthesis of C7 on T1. Skull base and vertebrae: 7 cervical segments are noted. Disc space narrowing is noted at C5-6, C6-7 and C7-T1. Significant sclerosis is noted at the C7-T1 interspace. Degenerative anterolisthesis is seen at C7-T1. Associated central canal stenosis is noted at this level. No acute fracture or acute facet abnormality is noted. Facet hypertrophic changes are noted at multiple levels. Soft tissues and spinal canal: No prevertebral fluid or swelling. No visible canal hematoma.  Upper chest: Within normal limits. Other: None IMPRESSION: CT of the head: Chronic atrophic and ischemic changes without acute abnormality. CT of the cervical spine: Multilevel degenerative change without acute abnormality Electronically Signed   By: Inez Catalina M.D.   On: 11/23/2017 18:33   Mr Brain Wo Contrast  Result Date: 11/24/2017 CLINICAL DATA:  Altered mental status EXAM: MRI HEAD WITHOUT CONTRAST TECHNIQUE: Multiplanar, multiecho pulse sequences of the brain and surrounding structures were obtained without intravenous contrast. COMPARISON:  Head CT 11/23/2017 FINDINGS: BRAIN: The midline structures are normal. There is abnormal diffusion restriction within the dorsolateral right pons and superior right cerebellum, within the right superior cerebellar artery territory, as demonstrated on the earlier CT. No mass lesion, hydrocephalus, dural abnormality or extra-axial collection. Early confluent hyperintense T2-weighted signal of the periventricular and deep white matter, most commonly due to chronic ischemic microangiopathy. No age-advanced or lobar predominant atrophy. There are a few central chronic microhemorrhages. VASCULAR: Major intracranial arterial and venous sinus flow voids are preserved. SKULL AND UPPER CERVICAL SPINE: The visualized skull base, calvarium, upper cervical spine and extracranial soft tissues are normal. SINUSES/ORBITS: No fluid levels or advanced mucosal thickening. No mastoid or middle ear effusion. Normal orbits. IMPRESSION: 1. Acute infarct of the superior right cerebellum and  right dorso lateral pons, in the superior cerebellar artery territory. 2. No hemorrhage or mass effect. 3. Chronic ischemic microangiopathy. Electronically Signed   By: Ulyses Jarred M.D.   On: 11/24/2017 06:03   Ct Cerebral Perfusion W Contrast  Result Date: 11/23/2017 CLINICAL DATA:  77 y/o  F; altered mental status of unclear cause. EXAM: CT ANGIOGRAPHY HEAD AND NECK CT PERFUSION BRAIN TECHNIQUE:  Multidetector CT imaging of the head and neck was performed using the standard protocol during bolus administration of intravenous contrast. Multiplanar CT image reconstructions and MIPs were obtained to evaluate the vascular anatomy. Carotid stenosis measurements (when applicable) are obtained utilizing NASCET criteria, using the distal internal carotid diameter as the denominator. Multiphase CT imaging of the brain was performed following IV bolus contrast injection. Subsequent parametric perfusion maps were calculated using RAPID software. CONTRAST:  153mL ISOVUE-370 IOPAMIDOL (ISOVUE-370) INJECTION 76% COMPARISON:  11/23/2017 CT head.  12/09/2004 MRI head. FINDINGS: CT HEAD FINDINGS Brain: Region of hypoattenuation within the right superior cerebellar hemisphere and extending into the right hemi pons compatible with late acute to subacute infarction (series 5, image 17 and series 3, image 12). No associated hemorrhage or mass effect. No additional region of acute infarction, hemorrhage, or mass effect in the brain identified. Stable background of chronic microvascular ischemic changes and parenchymal volume loss of the supratentorial brain. Several small chronic lacunar infarcts within basal ganglia. Vascular: As below. Skull: Normal. Negative for fracture or focal lesion. Sinuses/Orbits: No acute finding. Other: None. ASPECTS ([place] Stroke Program Early CT Score) - Ganglionic level infarction (caudate, lentiform nuclei, internal capsule, insula, M1-M3 cortex): 7 - Supraganglionic infarction (M4-M6 cortex): 3 Total score (0-10 with 10 being normal): 10 Review of the MIP images confirms the above findings CTA NECK FINDINGS Aortic arch: Standard branching. Imaged portion shows no evidence of aneurysm or dissection. No significant stenosis of the major arch vessel origins. Mild calcific atherosclerosis. Right carotid system: No evidence of dissection, stenosis (50% or greater) or occlusion. Mild non stenotic  calcific atherosclerosis of the carotid bifurcation. Left carotid system: No evidence of dissection, stenosis (50% or greater) or occlusion. Mild non stenotic calcific atherosclerosis of the carotid bifurcation. Vertebral arteries: Left dominant. No evidence of dissection, stenosis (50% or greater) or occlusion. Skeleton: C6-T1 severe erosive discogenic degenerative changes with endplate eburnation and grade 1 anterolisthesis at the C7-T1 level with there is severe facet arthritis. Bones are demineralized. Other neck: Multiple thyroid nodules measuring up to 9 mm on the left. Upper chest: Negative. Review of the MIP images confirms the above findings CTA HEAD FINDINGS Anterior circulation: No significant stenosis, proximal occlusion, aneurysm, or vascular malformation. The right ICA is larger than the left ICA likely due to variant anatomy where bilateral ACA are perfused by the right A1. Posterior circulation: Left P1 short segment of mild stenosis and right P1/P2 tandem segments of severe stenosis. Irregularity and mild stenosis of the left SCA origin. The right SCA attenuates in caliber early and appears occluded (series 12, image 120 and series 15, image 26). Venous sinuses: As permitted by contrast timing, patent. Anatomic variants: Anterior communicating artery and right posterior communicating artery. No left posterior communicating artery identified, likely hypoplastic or absent. Review of the MIP images confirms the above findings. CT Brain Perfusion Findings: CBF (<30%) Volume: 78mL Perfusion (Tmax>6.0s) volume: 56mL Mismatch Volume: 51mL Infarction Location:Right hemi pons and superior cerebellar hemisphere infarction. The area of infarction on noncontrast CT of head is larger than CBF (<30%) Volume indicating pseudonormalization of  a region of the core infarction. IMPRESSION: CT head: 1. 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 No hemorrhage or associated mass  effect. 2. Stable supratentorial chronic microvascular ischemic changes and parenchymal volume loss of the brain. CTA neck: 1. Patent carotid and vertebral arteries. No dissection, aneurysm, or hemodynamically significant stenosis utilizing NASCET criteria. 2. Severe cervical spondylosis at the C6-T1 levels. CTA head: 1. Right SCA occlusion. 2. Tandem segments of severe stenosis of right PCA. 3. Otherwise no large vessel occlusion, aneurysm, or significant stenosis is identified. CT brain perfusion: Perfusion anomaly in the right SCA distribution. The area of infarction on noncontrast CT of head is larger than CBF (<30%) Volume indicating pseudonormalization of a region of the core infarction. These results were called by telephone at the time of interpretation on 11/23/2017 at 9:03 pm to Dr. Francine Graven , who verbally acknowledged these results. Electronically Signed   By: Kristine Garbe M.D.   On: 11/23/2017 21:07    Scheduled Meds: . aspirin EC  325 mg Oral Daily   Or  . aspirin  300 mg Rectal Daily  . atorvastatin  20 mg Oral q1800  . calcium-vitamin D  1 tablet Oral Q breakfast  . feeding supplement  1 Container Oral BID BM  . insulin aspart  0-9 Units Subcutaneous Q4H  . metoprolol tartrate  25 mg Oral BID  . predniSONE  5 mg Oral Q breakfast  . senna  2 tablet Oral QHS  . sodium chloride flush  3 mL Intravenous Q12H   Continuous Infusions: . levofloxacin (LEVAQUIN) IV Stopped (11/24/17 0036)  . vancomycin       LOS: 1 day   Time spent: 35 minutes.  Vance Gather, MD [facility] Pager [phone]  If 7PM-7AM, please contact night-coverage [email or web address] Password [facility] 11/24/2017, 2:13 PM

## 2017-11-24 NOTE — Evaluation (Signed)
Physical Therapy Evaluation Patient Details Name: Elizabeth Drake MRN: [id number] DOB: [birth year] Today's Date: 11/24/2017   History of Present Illness  77 year old femalewith medical history significant fortype 2 diabetes mellitus, hypertension, chronic pain on opiate analgesia, history of pulmonary embolism on Xarelto, and sarcoidosis on chronic prednisone, initially presented to the ED poorly responsive in setting of questionable opiod overdose, found to have neuro change and imaging also revealed SCA infarct.  Clinical Impression  Orders received for PT evaluation. Patient demonstrates deficits in functional mobility as indicated below. Will benefit from continued skilled PT to address deficits and maximize function. Will see as indicated and progress as tolerated.  At this time, patient unable to tolerated activity or remain alert or engaged. Anticipate patient will need post acute rehabilitation to address deficits, recommend SNF.    Follow Up Recommendations SNF    Equipment Recommendations  Other (comment)(TBD)    Recommendations for Other Services       Precautions / Restrictions Precautions Precautions: Fall      Mobility  Bed Mobility Overal bed mobility: Needs Assistance Bed Mobility: Rolling;Supine to Sit;Sit to Supine Rolling: Max assist;+2 for physical assistance   Supine to sit: Max assist;+2 for physical assistance Sit to supine: Max assist;+2 for physical assistance   General bed mobility comments: Patient able to initiate soem LE movement with multi modal cues but unable to carry out functional task without max assist 2 persons  Transfers Overall transfer level: Needs assistance   Transfers: Sit to/from Stand Sit to Stand: +2 physical assistance;Total assist         General transfer comment: attempted to stand, +2 total with barely any clearance of buttucks from bed  Ambulation/Gait                Stairs            Wheelchair  Mobility    Modified Rankin (Stroke Patients Only)       Balance Overall balance assessment: Needs assistance Sitting-balance support: Feet supported Sitting balance-Leahy Scale: Zero Sitting balance - Comments: total assist to maintain EOB Postural control: Posterior lean                                   Pertinent Vitals/Pain Pain Assessment: Faces Faces Pain Scale: No hurt    Home Living Family/patient expects to be discharged to:: Private residence Living Arrangements: Spouse/significant other Available Help at Discharge: Family Type of Home: House Home Access: Ramped entrance         Additional Comments: patient unable to provide history, some information taken from chart review    Prior Function           Comments: unclear     Hand Dominance        Extremity/Trunk Assessment   Upper Extremity Assessment Upper Extremity Assessment: Defer to OT evaluation    Lower Extremity Assessment Lower Extremity Assessment: RLE deficits/detail;LLE deficits/detail;Difficult to assess due to impaired cognition RLE Deficits / Details: noted active movements in LEs but limited, unable to formally assess due to lethargy and cognition grossly 2/5 RLE Coordination: decreased fine motor;decreased gross motor LLE Deficits / Details: noted active minimal active movement in LE but unable to fully assess due to lathergy and cognition grossly 2/5 LLE Coordination: decreased fine motor;decreased gross motor    Cervical / Trunk Assessment Cervical / Trunk Assessment: Kyphotic  Communication   Communication: Expressive difficulties  Cognition Arousal/Alertness:  Lethargic Behavior During Therapy: Flat affect Overall Cognitive Status: No family/caregiver present to determine baseline cognitive functioning Area of Impairment: Attention;Orientation;Awareness;Problem solving;Safety/judgement;Memory                 Orientation Level: Disoriented  to;Time Current Attention Level: Focused Memory: Decreased short-term memory   Safety/Judgement: Decreased awareness of safety;Decreased awareness of deficits Awareness: Intellectual Problem Solving: Slow processing;Decreased initiation;Difficulty sequencing;Requires verbal cues;Requires tactile cues        General Comments      Exercises     Assessment/Plan    PT Assessment Patient needs continued PT services  PT Problem List Decreased strength;Decreased activity tolerance;Decreased balance;Decreased mobility;Decreased coordination;Decreased cognition;Decreased safety awareness       PT Treatment Interventions DME instruction;Gait training;Functional mobility training;Therapeutic activities;Therapeutic exercise;Balance training;Neuromuscular re-education;Cognitive remediation;Patient/family education    PT Goals (Current goals can be found in the Care Plan section)  Acute Rehab PT Goals Patient Stated Goal: none stated PT Goal Formulation: Patient unable to participate in goal setting Time For Goal Achievement: 12/08/17 Potential to Achieve Goals: Fair    Frequency Min 2X/week   Barriers to discharge        Co-evaluation               AM-PAC PT "6 Clicks" Daily Activity  Outcome Measure Difficulty turning over in bed (including adjusting bedclothes, sheets and blankets)?: Unable Difficulty moving from lying on back to sitting on the side of the bed? : Unable Difficulty sitting down on and standing up from a chair with arms (e.g., wheelchair, bedside commode, etc,.)?: Unable Help needed moving to and from a bed to chair (including a wheelchair)?: Total Help needed walking in hospital room?: Total Help needed climbing 3-5 steps with a railing? : Total 6 Click Score: 6    End of Session Equipment Utilized During Treatment: Gait belt;Oxygen Activity Tolerance: Patient limited by fatigue;Patient limited by lethargy Patient left: in bed;with call bell/phone  within reach;with bed alarm set Nurse Communication: Mobility status PT Visit Diagnosis: Muscle weakness (generalized) (M62.81);Other symptoms and signs involving the nervous system (R29.898)    Time: 3790-2409 PT Time Calculation (min) (ACUTE ONLY): 20 min   Charges:   PT Evaluation $PT Eval Moderate Complexity: 1 Mod     PT G Codes:        Alben Deeds, PT DPT  Board Certified Neurologic Specialist [place] 11/24/2017, 1:17 PM

## 2017-11-25 ENCOUNTER — Inpatient Hospital Stay (HOSPITAL_COMMUNITY): Payer: Medicare Other

## 2017-11-25 DIAGNOSIS — I480 Paroxysmal atrial fibrillation: Secondary | ICD-10-CM

## 2017-11-25 DIAGNOSIS — I340 Nonrheumatic mitral (valve) insufficiency: Secondary | ICD-10-CM

## 2017-11-25 DIAGNOSIS — R4182 Altered mental status, unspecified: Secondary | ICD-10-CM

## 2017-11-25 DIAGNOSIS — I351 Nonrheumatic aortic (valve) insufficiency: Secondary | ICD-10-CM

## 2017-11-25 DIAGNOSIS — E1159 Type 2 diabetes mellitus with other circulatory complications: Secondary | ICD-10-CM

## 2017-11-25 LAB — BASIC METABOLIC PANEL
Anion gap: 6 (ref 5–15)
BUN: 5 mg/dL — ABNORMAL LOW (ref 6–20)
CHLORIDE: 105 mmol/L (ref 101–111)
CO2: 25 mmol/L (ref 22–32)
CREATININE: 0.64 mg/dL (ref 0.44–1.00)
Calcium: 9.1 mg/dL (ref 8.9–10.3)
GFR calc non Af Amer: >60 mL/min (ref >60)
Glucose, Bld: 82 mg/dL (ref 65–99)
POTASSIUM: 3.1 mmol/L — AB (ref 3.5–5.1)
SODIUM: 136 mmol/L (ref 135–145)

## 2017-11-25 LAB — GLUCOSE, CAPILLARY
GLUCOSE-CAPILLARY: 122 mg/dL — AB (ref 65–99)
GLUCOSE-CAPILLARY: 143 mg/dL — AB (ref 65–99)
Glucose-Capillary: 113 mg/dL — ABNORMAL HIGH (ref 65–99)
Glucose-Capillary: 90 mg/dL (ref 65–99)
Glucose-Capillary: 105 mg/dL — ABNORMAL HIGH (ref 65–99)

## 2017-11-25 LAB — LIPID PANEL
Cholesterol: 155 mg/dL (ref 0–200)
HDL: 33 mg/dL — ABNORMAL LOW (ref >40)
LDL CALC: 95 mg/dL (ref 0–99)
TRIGLYCERIDES: 137 mg/dL (ref <150)
Total CHOL/HDL Ratio: 4.7 RATIO
VLDL: 27 mg/dL (ref 0–40)

## 2017-11-25 LAB — HEMOGLOBIN A1C
Hgb A1c MFr Bld: 6 % — ABNORMAL HIGH (ref 4.8–5.6)
Mean Plasma Glucose: 125.5 mg/dL

## 2017-11-25 LAB — LEGIONELLA PNEUMOPHILA SEROGP 1 UR AG: L. PNEUMOPHILA SEROGP 1 UR AG: NEGATIVE

## 2017-11-25 LAB — ECHOCARDIOGRAM COMPLETE: Weight: 1908.3 oz

## 2017-11-25 LAB — PROCALCITONIN

## 2017-11-25 MED ORDER — ORAL CARE MOUTH RINSE: 15.0000 mL | Freq: Two times a day (BID) | OROMUCOSAL | Status: DC

## 2017-11-25 MED ORDER — CHLORHEXIDINE GLUCONATE 0.12 % MT SOLN
15.0000 mL | Freq: Two times a day (BID) | OROMUCOSAL | Status: DC
  Administered 2017-11-25 – 2017-11-26 (×3): 15 mL via OROMUCOSAL
  Filled 2017-11-25 (×2): qty 15

## 2017-11-25 MED ORDER — SODIUM CHLORIDE 0.9 % IV SOLN
1.0000 g | INTRAVENOUS | Status: DC
  Administered 2017-11-25 – 2017-11-26 (×2): 1 g via INTRAVENOUS
  Filled 2017-11-25 (×2): qty 10

## 2017-11-25 MED ORDER — POTASSIUM CHLORIDE 10 MEQ/100ML IV SOLN
10.0000 meq | INTRAVENOUS | Status: AC
  Filled 2017-11-25 (×4): qty 100

## 2017-11-25 MED ORDER — CHLORHEXIDINE GLUCONATE 0.12 % MT SOLN
15.0000 mL | Freq: Two times a day (BID) | OROMUCOSAL | Status: DC
  Filled 2017-11-25: qty 15

## 2017-11-25 MED ORDER — ORAL CARE MOUTH RINSE
15.0000 mL | Freq: Two times a day (BID) | OROMUCOSAL | Status: DC
  Administered 2017-11-25: 15 mL via OROMUCOSAL

## 2017-11-25 MED ORDER — RESOURCE THICKENUP CLEAR PO POWD
ORAL | Status: DC | PRN
  Filled 2017-11-25: qty 125

## 2017-11-25 MED ORDER — POTASSIUM CHLORIDE 10 MEQ/100ML IV SOLN
10.0000 meq | INTRAVENOUS | Status: AC
  Administered 2017-11-25 (×3): 10 meq via INTRAVENOUS
  Filled 2017-11-25 (×4): qty 100

## 2017-11-25 NOTE — Progress Notes (Signed)
STROKE TEAM PROGRESS NOTE   SUBJECTIVE (INTERVAL HISTORY) Husband and sister are present. The patient still lethargic but she was able to arouse easily. She passed swallow, on dysphagia diet 2 and honey thick liquid. Still has predominant RUE ataxia. PT/OT recommend SNF.   OBJECTIVE Temp:  [97.6 F (36.4 C)-98.7 F (37.1 C)] 98 F (36.7 C) (04/22 0937) Pulse Rate:  [70-73] 72 (04/22 1202) Cardiac Rhythm: Normal sinus rhythm (04/22 0702) Resp:  [16-18] 17 (04/22 1202) BP: (149-173)/(59-73) 173/73 (04/22 1202) SpO2:  [96 %-98 %] 98 % (04/22 1202) Weight:  [119 lb 4.3 oz (54.1 kg)] 119 lb 4.3 oz (54.1 kg) (04/22 0429)  CBC:  Recent Labs  Lab 11/23/17 1758  WBC 11.7*  NEUTROABS 9.7*  HGB 11.5*  HCT 36.7  MCV 98.1  PLT 818    Basic Metabolic Panel:  Recent Labs  Lab 11/23/17 1758 11/25/17 0344  NA 139 136  K 3.6 3.1*  CL 105 105  CO2 26 25  GLUCOSE 172* 82  BUN 17 5*  CREATININE 0.62 0.64  CALCIUM 8.7* 9.1    Lipid Panel:     Component Value Date/Time   CHOL 155 11/25/2017 0344   CHOL 176 10/14/2017 1602   CHOL 165 12/31/2012 1210   TRIG 137 11/25/2017 0344   TRIG 169 (H) 12/17/2014 1054   TRIG 183 (H) 12/31/2012 1210   HDL 33 (L) 11/25/2017 0344   HDL 41 10/14/2017 1602   HDL 49 12/17/2014 1054   HDL 50 12/31/2012 1210   CHOLHDL 4.7 11/25/2017 0344   VLDL 27 11/25/2017 0344   LDLCALC 95 11/25/2017 0344   LDLCALC 88 10/14/2017 1602   LDLCALC 86 04/28/2014 0924   LDLCALC 78 12/31/2012 1210   HgbA1c:  Lab Results  Component Value Date   HGBA1C 6.0 (H) 11/25/2017   Urine Drug Screen:     Component Value Date/Time   LABOPIA POSITIVE (A) 11/23/2017 1740   COCAINSCRNUR NONE DETECTED 11/23/2017 1740   LABBENZ NONE DETECTED 11/23/2017 1740   AMPHETMU NONE DETECTED 11/23/2017 1740   THCU NONE DETECTED 11/23/2017 1740   LABBARB NONE DETECTED 11/23/2017 1740    Alcohol Level     Component Value Date/Time   ETH <10 11/23/2017 1758    IMAGING I  have personally reviewed the radiological images below and agree with the radiology interpretations.  Ct Angio Head W/cm &/or Wo Cm Ct Angio Neck W And/or Wo Contrast Ct Cerebral Perfusion W Contrast 11/23/2017 IMPRESSION:   CT head:  1. 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 No hemorrhage or associated mass effect.  2. Stable supratentorial chronic microvascular ischemic changes and parenchymal volume loss of the brain.   CTA neck:  1. Patent carotid and vertebral arteries. No dissection, aneurysm, or hemodynamically significant stenosis utilizing NASCET criteria.  2. Severe cervical spondylosis at the C6-T1 levels.   CTA head:  1. Right SCA occlusion.  2. Tandem segments of severe stenosis of right PCA.  3. Otherwise no large vessel occlusion, aneurysm, or significant stenosis is identified.   CT perfusion:  Perfusion anomaly in the right SCA distribution. The area of infarction on noncontrast CT of head is larger than CBF (<30%) Volume indicating pseudonormalization of a region of the core infarction.   Ct Head Wo Contrast 11/23/2017 IMPRESSION:  Chronic atrophic and ischemic changes without acute abnormality.   Mr Brain Wo Contrast 11/24/2017 IMPRESSION:  1. Acute infarct of the superior right cerebellum and right dorso lateral  pons, in the superior cerebellar artery territory.  2. No hemorrhage or mass effect.  3. Chronic ischemic microangiopathy.   Dg Chest 1 View 11/23/2017 IMPRESSION:  1. Mild left pleural effusion or thickening. Airspace disease at the lingula and left base, some of this may be chronic but difficult to exclude an acute infiltrate in the region  2. Cardiomegaly with mild vascular congestion   CT of the cervical spine:  11/23/2017 IMPRESSION: Multilevel degenerative change without acute abnormality   Transthoracic Echocardiogram - Left ventricle: The cavity size was normal. Wall thickness was   increased in  a pattern of moderate LVH. Systolic function was   normal. The estimated ejection fraction was in the range of 60%   to 65%. Wall motion was normal; there were no regional wall   motion abnormalities. Features are consistent with a pseudonormal   left ventricular filling pattern, with concomitant abnormal   relaxation and increased filling pressure (grade 2 diastolic   dysfunction). - Aortic valve: There was mild regurgitation. - Mitral valve: There was mild regurgitation.    PHYSICAL EXAM Vitals:   11/25/17 0002 11/25/17 0429 11/25/17 0937 11/25/17 1202  BP: (!) 167/73 (!) 149/59 (!) 158/70 (!) 173/73  Pulse: 73 70 71 72  Resp: 18 18 16 17   Temp: 97.6 F (36.4 C) 98.7 F (37.1 C) 98 F (36.7 C)   TempSrc: Oral Oral Oral   SpO2: 97% 98% 98% 98%  Weight:  119 lb 4.3 oz (54.1 kg)      General - Well nourished, well developed, lethargic and sleepy  Ophthalmologic - Fundi not visualized due to noncooperation.  Cardiovascular - Regular rate and rhythm, not in afib rhythm.  Neuro - drowsy sleepy and lethargic, but easily arousable and open eyes on voice. Able to follow limited simple commands. Orientated to place, situation and self, not orientated to time, age or people. Severe dysarthria but able to name and repeat, paucity of speech. PERRL, right gaze incomplete. Visual field grossly full. No significant facial weakness, tongue midline. BUE 3/5, against gravity, BLE proximal 0/5, but distally LLE 3/5 DF and PF, RLE 3/5 PF but 0/5 DF. DTR diminished and babinski negative. Sensation symmetrical, coordination BLEs not cooperative, but BUE ataxic, with R>L. Gait not tested.   No significant change on exam today.   ASSESSMENT/PLAN Elizabeth Drake is a 77 y.o. female with history of atrial fibrillation on Xarelto, chronic pain, diabetes mellitus, previous DVT, hypertension, history of pulmonary emboli, sarcoidosis, and history of vertigo presenting with lethargy and posturing. She  did not receive IV t-PA due to anticoagulation.  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  Resultant - lethargy, b/l UE ataxia  CT head - Right SCA territory infarction involving right superior cerebellum and hemi pons.  MRI head - Acute infarct of the superior right cerebellum and right dorso lateral pons, in the superior cerebellar artery territory.   CTA H&N -  Rt SCA occlusion. Tandem segments of severe stenosis of Rt PCA.   2D Echo EF 60-65%  LDL 95  HgbA1c 6.0  VTE prophylaxis - lovenox Fall precautions DIET DYS 2 Room service appropriate? Yes; Fluid consistency: Honey Thick  Xarelto (rivaroxaban) daily prior to admission, now on aspirin 325 mg daily. Resume Xarelto in 7 days post stroke (12/01/17) to avoid hemorrhagic conversion.  Patient counseled to be compliant with her antithrombotic medications  Ongoing aggressive stroke risk factor management  Therapy recommendations:  SNF  Disposition:  Pending  afib not compliant with AC  Currently not in afib  Rate controlled  Not compliant with AC and pt needs supervision on meds at home  Consider to resume Xarelto in 7 days post stroke 12/01/17  Hypertension  Stable . Permissive hypertension (OK if < 220/120) but gradually normalize in 5-7 days . Long-term BP goal normotensive  Hyperlipidemia  Lipid lowering medication PTA:  none  LDL 95, goal < 70  Current lipid lowering medication: put on Lipitor 20 mg daily  Continue statin at discharge  Diabetes  HgbA1c 6.0, goal < 7.0  Controlled  SSI  CBG monitoring  Dysphagia  Passed swallow on dys 2 diet with honey thick    Speech to follow  Other Stroke Risk Factors  Advanced age  Former cigarette smoker - quit 16 years ago  Hx of DVT and PE - on Xarelto at home but noncompliant   Other Active Problems  Community acquired pneumonia - Levaquin / vancomycin  Sarcoidosis -  on chronic prednisone  Hospital day # 2  Neurology will sign off. Please call with questions. Pt will follow up with stroke clinic NP at [facility] in about 4 weeks. Thanks for the consult.   Rosalin Hawking, MD PhD Stroke Neurology 11/25/2017 4:47 PM    To contact Stroke Continuity provider, please refer to [email or web address] After hours, contact General Neurology

## 2017-11-25 NOTE — NC FL2 (Signed)
[place] LEVEL OF CARE SCREENING TOOL     IDENTIFICATION  Patient Name: Elizabeth Drake Birthdate: [birth year] Sex: female Admission Date (Current Location): 11/23/2017  [facility] and [place] Number:  Herbalist and Address:  [facility] [place], [place] [address] [place], [place] [address]      Provider Number: [id number]  Attending Physician Name and Address:  Patrecia Pour, MD  Relative Name and Phone Number:       Current Level of Care: Hospital Recommended Level of Care: [place] Prior Approval Number:    Date Approved/Denied:   PASRR Number: [id number]  Discharge Plan: SNF    Current Diagnoses: Patient Active Problem List   Diagnosis Date Noted  . Cerebral embolism with cerebral infarction 11/24/2017  . Pressure injury of skin 11/24/2017  . Altered mental status   . Paroxysmal atrial fibrillation (HCC)   . Obtundation 11/23/2017  . Consolidation of left lower lobe of lung ([place]) 11/23/2017  . Encounter for long-term use of opiate analgesic 06/12/2016  . Pain management contract agreement 06/12/2016  . Protein-calorie malnutrition, severe 06/28/2015  . Closed left hip fracture ([place]) 06/25/2015  . Hypertension 10/12/2013  . Physical deconditioning 06/30/2013  . History of pulmonary embolus (PE) 06/24/2013  . Diabetes ([place]) 04/25/2013  . OAB (overactive bladder) 04/25/2013  . Wheelchair bound 04/25/2013  . Osteoporosis 04/25/2013  . Chronic pain syndrome (LEGS/BACK) 04/25/2013  . OA (osteoarthritis) 04/25/2013  . Hyperlipidemia 12/31/2012  . 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 Wound on left toe, no dressing.) PU Stage 1 Dressing: (Mid Sacrum, foam dressing, change PRN)                     Personal Care Assistance Level of Assistance  Bathing, Feeding, Dressing Bathing Assistance: Maximum assistance Feeding assistance: Limited assistance Dressing Assistance: Maximum assistance     Functional Limitations Info  Sight, Hearing, Speech Sight Info: Adequate Hearing Info: Adequate Speech Info: Impaired(Delayed responses)    SPECIAL CARE FACTORS FREQUENCY  OT (By licensed OT), PT (By licensed PT)     PT Frequency: 2x OT Frequency: 2x            Contractures Contractures Info: Not present    Additional Factors Info  Code Status, Allergies Code Status Info: DNR Allergies Info: Advil Ibuprofen, Propoxyphene, Azo Phenazopyridine, Indocin Indomethacin, Keflex Cephalexin, Loratadine, Percocet Oxycodone-acetaminophen           Current Medications (11/25/2017):  This is the current hospital active medication list Current Facility-Administered Medications  Medication Dose Route Frequency Provider Last Rate Last Dose  . acetaminophen (TYLENOL) tablet 650 mg  650 mg Oral Q6H PRN Opyd, Ilene Qua, MD       Or  . acetaminophen (TYLENOL) suppository 650 mg  650 mg Rectal Q6H PRN Opyd, Ilene Qua, MD      . aspirin EC tablet 325 mg  325 mg Oral Daily Rosalin Hawking, MD   325 mg at 11/25/17 1403   Or  . aspirin suppository 300  mg  300 mg Rectal Daily Rosalin Hawking, MD      . atorvastatin (LIPITOR) tablet 20 mg  20 mg Oral q1800 Rinehuls, David L, PA-C      . bisacodyl (DULCOLAX) suppository 10 mg  10 mg Rectal Daily PRN Opyd, Ilene Qua, MD      . calcium-vitamin D (OSCAL WITH D) 500-200 MG-UNIT per tablet 1 tablet  1 tablet Oral Q breakfast Opyd, Ilene Qua, MD   1 tablet at 11/25/17 1402  . cefTRIAXone (ROCEPHIN) 1 g in sodium  chloride 0.9 % 100 mL IVPB  1 g Intravenous Q24H Patrecia Pour, MD   Stopped at 11/25/17 1228  . chlorhexidine (PERIDEX) 0.12 % solution 15 mL  15 mL Mouth Rinse BID Vance Gather B, MD      . enoxaparin (LOVENOX) injection 40 mg  40 mg Subcutaneous Q24H Rosalin Hawking, MD   40 mg at 11/24/17 1716  . feeding supplement (BOOST / RESOURCE BREEZE) liquid 1 Container  1 Container Oral BID BM Opyd, Ilene Qua, MD   Stopped at 11/25/17 1000  . insulin aspart (novoLOG) injection 0-9 Units  0-9 Units Subcutaneous Q4H Opyd, Ilene Qua, MD   3 Units at 11/24/17 2129  . MEDLINE mouth rinse  15 mL Mouth Rinse q12n4p Patrecia Pour, MD      . ondansetron (ZOFRAN) tablet 4 mg  4 mg Oral Q6H PRN Opyd, Ilene Qua, MD       Or  . ondansetron (ZOFRAN) injection 4 mg  4 mg Intravenous Q6H PRN Opyd, Ilene Qua, MD      . polyethylene glycol (MIRALAX / GLYCOLAX) packet 17 g  17 g Oral Daily PRN Opyd, Ilene Qua, MD      . potassium chloride 10 mEq in 100 mL IVPB  10 mEq Intravenous Q1 Hr x 4 Vance Gather B, MD 100 mL/hr at 11/25/17 1408 10 mEq at 11/25/17 1408  . predniSONE (DELTASONE) tablet 5 mg  5 mg Oral Q breakfast Opyd, Ilene Qua, MD   5 mg at 11/25/17 1402  . RESOURCE THICKENUP CLEAR   Oral PRN Patrecia Pour, MD      . senna (SENOKOT) tablet 17.2 mg  2 tablet Oral QHS Opyd, Ilene Qua, MD      . sodium chloride flush (NS) 0.9 % injection 3 mL  3 mL Intravenous Q12H Opyd, Ilene Qua, MD   3 mL at 11/25/17 1216     Discharge Medications: Please see discharge summary for a list of discharge medications.  Relevant Imaging Results:  Relevant Lab Results:   Additional Information SSN: [id number]  Eileen Stanford, LCSW

## 2017-11-25 NOTE — Progress Notes (Signed)
Modified Barium Swallow Progress Note  Patient Details  Name: Elizabeth Drake MRN: [id number] Date of Birth: [birth year]  Today's Date: 11/25/2017  Modified Barium Swallow completed.  Full report located under Chart Review in the Imaging Section.  Brief recommendations include the following:  Clinical Impression  Moderate oral dysphagia marked by anterior spill, decreased control and delayed oral transit. Pharyngeal phase characterized by sensory impairments with late swallow initiation at the pyriform sinuses with thinner consistencies and valleculae with honey thick. Laryngeal penetration to vocal cords consistently with thin and nectar barium without sensation and ineffective attempts at cued throat clear/cough. No appreciable post swallow residue. Positioning somewhat challenging due to kyphosis. Recommend honey thick liquids, Dys 2 texture, crush pills and full assist and supervision with ST for safety and education.     Swallow Evaluation Recommendations       SLP Diet Recommendations: Dysphagia 2 (Fine chop) solids;Honey thick liquids   Liquid Administration via: Cup   Medication Administration: Crushed with puree   Supervision: Full supervision/cueing for compensatory strategies;Staff to assist with self feeding   Compensations: Slow rate;Small sips/bites;Lingual sweep for clearance of pocketing   Postural Changes: Seated upright at 90 degrees   Oral Care Recommendations: Oral care BID        Elizabeth Drake 11/25/2017,11:56 AM   Orbie Pyo Colvin Caroli.Ed [employer] [phone]

## 2017-11-25 NOTE — Progress Notes (Signed)
Initial Nutrition Assessment  DOCUMENTATION CODES:   Not applicable  INTERVENTION:  Magic cup TID with meals, each supplement provides 290 kcal and 9 grams of protein  Monitor and encourage PO intake  NUTRITION DIAGNOSIS:   Swallowing difficulty related to dysphagia as evidenced by other (comment)(NDD2 diet, honey thickened liquids).  GOAL:   Patient will meet greater than or equal to 90% of their needs  MONITOR:   PO intake, Supplement acceptance  REASON FOR ASSESSMENT:   Low [place]    ASSESSMENT:   Ms. Countess is a 77 yo female with PMH of HTN, T2DM, Chronic Pain on opioids, sarcoidosis on prednisone chronically, presented to ED poorly responsive and obtunded, found to have neuro change, acute infarcts, a mild left pleural effusion or thickening.  Spoke with patient at bedside, she was able to answer some questions but had difficulty providing history due to stroke. States she was eating 3-4 times a day PTA, denies any weight loss, she was unable to provide a usual body weight but per chart her weight has been stable between 112-119 pounds since 2014. Underwent MBSS today, now on honey thick liquids, NDD2. NFPE results likely related to age and mobility.  Labs reviewed:  K+ 3.1 Medications reviewed and include:  Calcium-Vitamin D, Insulin, Prednisone, Senokot IV 10 KCL x4   NUTRITION - FOCUSED PHYSICAL EXAM:    Most Recent Value  Orbital Region  Moderate depletion  Upper Arm Region  No depletion  Thoracic and Lumbar Region  No depletion  Buccal Region  No depletion  Temple Region  Moderate depletion  Clavicle Bone Region  Mild depletion  Clavicle and Acromion Bone Region  No depletion  Scapular Bone Region  No depletion  Dorsal Hand  Moderate depletion  Patellar Region  Severe depletion  Anterior Thigh Region  Severe depletion  Posterior Calf Region  Moderate depletion       Diet Order:  Fall precautions DIET DYS 2 Room service appropriate? Yes; Fluid  consistency: Honey Thick  EDUCATION NEEDS:   Not appropriate for education at this time  Skin:  Skin Assessment: Skin Integrity Issues: Skin Integrity Issues:: Stage I Stage I: R Heel, Sacrum  Last BM:  PTA  Height:   Ht Readings from Last 1 Encounters:  01/16/16 5\' 6"  (1.676 m)    Weight:   Wt Readings from Last 1 Encounters:  11/25/17 119 lb 4.3 oz (54.1 kg)    Ideal Body Weight:  59.09 kg  BMI:  Body mass index is 19.25 kg/m.  Estimated Nutritional Needs:   Kcal:  1250-1460 calories (MSJ x1.2-1.4)  Protein:  70-81 grams (1.3-1.5g/kg)  Fluid:  >1.5L  Satira Anis. Drew Lips, MS, RD LDN Inpatient Clinical Dietitian Pager [phone]

## 2017-11-25 NOTE — Evaluation (Signed)
Clinical/Bedside Swallow Evaluation Patient Details  Name: Elizabeth Drake MRN: [id number] Date of Birth: Feb 11, 1941  Today's Date: 11/25/2017 Time: SLP Start Time (ACUTE ONLY): 1884 SLP Stop Time (ACUTE ONLY): 0933 SLP Time Calculation (min) (ACUTE ONLY): 12 min  Past Medical History:  Past Medical History:  Diagnosis Date  . Arthritis   . Atrial fibrillation ([place])   . Atrial fibrillation with RVR ([place]) 09/13/2014  . Chronic pain   . Diabetes mellitus without complication ([place])   . DVT (deep venous thrombosis) ([place])   . Hiatal hernia   . Hypertension   . Osteoporosis   . Pulmonary emboli ([place])   . Sarcoidosis   . Vertigo   . Wheelchair bound    Past Surgical History:  Past Surgical History:  Procedure Laterality Date  . ABDOMINAL HYSTERECTOMY    . I&D EXTREMITY Left 09/13/2014   Procedure: IRRIGATION AND DEBRIDEMENT EXTREMITY;  Surgeon: Linna Hoff, MD;  Location: [facility];  Service: Orthopedics;  Laterality: Left;  . INTRAMEDULLARY (IM) NAIL INTERTROCHANTERIC Left 06/27/2015   Procedure: INTRAMEDULLARY (IM) NAIL INTERTROCHANTRIC AFFIXIS;  Surgeon: Marybelle Killings, MD;  Location: [facility];  Service: Orthopedics;  Laterality: Left;  . JOINT REPLACEMENT     toe  . LUNG LOBECTOMY Left    Lower   HPI:  77 year old female with medical history significant for type 2 diabetes mellitus, hypertension, chronic pain on opiate analgesia, pulmonary embolism, sarcoidosis initially presented to the ED poorly responsive in setting of questionable opiod overdose, found to have neuro change. MRI acute infarct of the superior right cerebellum and right dorso lateral pons, in the superior cerebellar artery territory. CXR mild left pleural effusion or thickening. Airspace disease at the lingula and left base, some of this may be chronic but difficult to exclude an acute infiltrate in the region, cardiomegaly with mild vascular congestion.   Assessment / Plan / Recommendation Clinical Impression  Suspect pt  has suboptimal protective measures during swallow indicated by immediate cough with water and wet vocal quality. Multiple swallows may represent pharyngeal residue and poor labial seal present. Pt has had infarcts affecting her pons which increases aspiration risk. Recommend MBS which is scheduled for 10:30 today.  SLP Visit Diagnosis: Dysphagia, oral phase (R13.11)    Aspiration Risk  Moderate aspiration risk    Diet Recommendation NPO        Other  Recommendations     Follow up Recommendations Other (comment)(TBD)      Frequency and Duration            Prognosis        Swallow Study   General HPI: 77 year old female with medical history significant for type 2 diabetes mellitus, hypertension, chronic pain on opiate analgesia, pulmonary embolism, sarcoidosis initially presented to the ED poorly responsive in setting of questionable opiod overdose, found to have neuro change. MRI acute infarct of the superior right cerebellum and right dorso lateral pons, in the superior cerebellar artery territory. CXR mild left pleural effusion or thickening. Airspace disease at the lingula and left base, some of this may be chronic but difficult to exclude an acute infiltrate in the region, cardiomegaly with mild vascular congestion. Type of Study: Bedside Swallow Evaluation Previous Swallow Assessment: (none) Diet Prior to this Study: NPO Temperature Spikes Noted: No Respiratory Status: Room air History of Recent Intubation: No Behavior/Cognition: Cooperative;Pleasant mood;Requires cueing;Lethargic/Drowsy Oral Cavity Assessment: Dry Oral Care Completed by SLP: Yes Vision: Functional for self-feeding Self-Feeding Abilities: Needs set up;Needs assist Patient Positioning:  Upright in bed Baseline Vocal Quality: Normal Volitional Cough: Strong Volitional Swallow: Able to elicit    Oral/Motor/Sensory Function Overall Oral Motor/Sensory Function: Other (comment)(lingual discoordination)   Ice  Chips Ice chips: Within functional limits Presentation: Spoon   Thin Liquid Thin Liquid: Impaired Presentation: Cup;Straw Oral Phase Impairments: Reduced labial seal Oral Phase Functional Implications: Left anterior spillage Pharyngeal  Phase Impairments: Multiple swallows;Cough - Immediate;Wet Vocal Quality    Nectar Thick Nectar Thick Liquid: Not tested   Honey Thick Honey Thick Liquid: Not tested   Puree Puree: Within functional limits   Solid   GO   Solid: Not tested        Houston Siren 11/25/2017,9:53 AM  Orbie Pyo Colvin Caroli.Ed [employer] [phone]

## 2017-11-25 NOTE — Progress Notes (Signed)
  Echocardiogram 2D Echocardiogram has been performed.  Jennette Dubin 11/25/2017, 8:48 AM

## 2017-11-25 NOTE — Progress Notes (Addendum)
PROGRESS NOTE  Elizabeth Drake  ZMO:[id number] DOB: 01-Nov-1940 DOA: 11/23/2017 PCP: Chevis Pretty, FNP   Brief Narrative: Elizabeth Drake is a 77 y.o. female with a history of HTN, T2DM, chronic pain on opioids, PE on xarelto, and sarcoidosis on prednisone chronically who presented to [facility] ED poorly responsive. Her husband and son provided the history. She had reportedly been in her usual state earlier in the day, was not complaining of anything this morning, and seemed normal when her husband went outside for approximately 1 hour.  Upon returning, patient was sleeping in her chair and difficult to arouse.  EMS was called, 1 mg of IV Narcan was given, patient woke up some but without any verbal response.  There has not been any recent fall or trauma reported and the patient is not known to use alcohol.  She filled Norco 10 mg on 11/21/2017 and has 12 pills missing.  Upon arrival to the ED, patient is found to be afebrile, saturating 80s on room air. CXR showed cardiomegaly with vascular congestion and lingular/LLL infiltrate (acute vs. chronic) with effusion. UDS +opioids only, negative ethanol, etc. Lactate mildly elevated and WBC 11.7k. CT head showed acute infarcts in right SCA distribution on diffusion imaging, so she was transferred to [facility] for stroke work up/neurology evaluation.  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 - Defer to neurology ?needs TEE  - ASA per neurology - Continue anticoagulation  (suspect noncompliance with xarelto) 7 days post stroke to prevent hemorrhagic conversion. - LDL is 95, continue statin - HbA1c is 6%, continue DM management - PT/OT: Recommending SNF   Dysphagia:  - SLP recommendations following MBSS 4/22: honey thick liquids, Dys 2 texture, crush pills and full assist and supervision   Left lingula and base infiltrate: Acute vs. chronic. Unable to accurately elicit symptoms from patient. Has mild leukocytosis but is on chronic prednisone. No fever and PCT undetectable not consistent with active infection.  MRSA negative. Strep and legionella antigens negative.  - No further treatment is indicated.   E. coli UTI: >100k E. coli with some resistance on urine culture. Unable to elicit sxs.  - Has not received effective therapy (resistant to FQ), will start ceftriaxone.  - Monitor blood cultures  Acute hypoxic respiratory failure: Resolved. Possibly due to obtundation. Some vascular congestion on CXR though no crackles on exam. Initially treated for PNA as above, though PCT is undetectable so abx stopped.  Hypokalemia:  - Replace by IV due to risk of esophagitis w/dysphagia. Recheck in AM.   History of PE:  - Continue xarelto  Sarcoidosis: Hx of lung and neuro involvement.  - Continue chronic prednisone, no indication for stress dosing.   T2DM: HbA1c 6% - Holding home oral medications - SSI continue to maintain tight post stroke control.   HTN:  - Will allow permissive HTN, so hold metoprolol.   Chronic pain:  - Holding narcotics for concern of overdose (had brief improvement with narcan) in addition to CVA.   DVT prophylaxis:  Enoxaparin Code Status: DNR Family Communication: DIL and grandson at bedside Disposition Plan: SNF following full stroke work up.  Consultants:   Neurology  Procedures:   Echocardiogram 11/25/2017: - Left ventricle: The cavity size was normal. Wall thickness was   increased in a pattern of moderate LVH. Systolic  function was   normal. The estimated ejection fraction was in the range of 60%   to 65%. Wall motion was normal; there were no regional wall   motion abnormalities. Features are consistent with a pseudonormal   left ventricular filling pattern, with concomitant abnormal   relaxation and increased filling pressure (grade 2 diastolic   dysfunction). - Aortic valve: There was mild regurgitation. - Mitral valve: There was mild regurgitation.  Antimicrobials:  Ciprofloxacin 4/20  Levofloxacin 4/20 >>   Vancomycin 4/20 >>   Subjective: Lethargic but rousable and will interact, speaking with family at bedside. Denies any pain. She is hungry (just got diet order after MBSS)  Objective: Vitals:   11/24/17 2004 11/25/17 0002 11/25/17 0429 11/25/17 0937  BP: (!) 153/63 (!) 167/73 (!) 149/59 (!) 158/70  Pulse: 72 73 70 71  Resp: 18 18 18 16   Temp: 98.7 F (37.1 C) 97.6 F (36.4 C) 98.7 F (37.1 C) 98 F (36.7 C)  TempSrc: Oral Oral Oral Oral  SpO2: 97% 97% 98% 98%  Weight:   54.1 kg (119 lb 4.3 oz)     Intake/Output Summary (Last 24 hours) at 11/25/2017 1200 Last data filed at 11/25/2017 0429 Gross per 24 hour  Intake 0 ml  Output 1600 ml  Net -1600 ml   Filed Weights   11/24/17 0358 11/25/17 0429  Weight: 54 kg (119 lb 0.8 oz) 54.1 kg (119 lb 4.3 oz)    Gen: Elderly female in no distress Pulm: Non-labored on room air and no crackles. Decreased at left base.  CV: Regular rate and rhythm. No murmur, rub, or gallop. No JVD, no pedal edema. GI: Abdomen soft, non-tender, non-distended, with normoactive bowel sounds. No organomegaly or masses felt. Ext: Warm, no deformities Skin: No rashes, lesions or ulcers Neuro: Drowsy but rousable, Moves extremities but very ataxic and weak R > L. Possible right neglect, though visual fields grossly full to confrontation. +Aphasia.  Psych: Normal mood and affect.  Data Reviewed: I have personally reviewed following labs and imaging  studies  CBC: Recent Labs  Lab 11/23/17 1758  WBC 11.7*  NEUTROABS 9.7*  HGB 11.5*  HCT 36.7  MCV 98.1  PLT 253   Basic Metabolic Panel: Recent Labs  Lab 11/23/17 1758 11/25/17 0344  NA 139 136  K 3.6 3.1*  CL 105 105  CO2 26 25  GLUCOSE 172* 82  BUN 17 5*  CREATININE 0.62 0.64  CALCIUM 8.7* 9.1   GFR: CrCl cannot be calculated (Unknown ideal weight.). Liver Function Tests: Recent Labs  Lab 11/23/17 1758  AST 19  ALT 11*  ALKPHOS 69  BILITOT 0.4  PROT 8.4*  ALBUMIN 3.4*   No results for input(s): LIPASE, AMYLASE in the last 168 hours. No results for input(s): AMMONIA in the last 168 hours. Coagulation Profile: Recent Labs  Lab 11/23/17 1758  INR 0.99   Cardiac Enzymes: Recent Labs  Lab 11/23/17 1758  TROPONINI <0.03   BNP (last 3 results) No results for input(s): PROBNP in the last 8760 hours. HbA1C: Recent Labs    11/25/17 0344  HGBA1C 6.0*   CBG: Recent Labs  Lab 11/24/17 1941 11/24/17 2237 11/24/17 2304 11/24/17  2354 11/25/17 0334  GLUCAP 97 88 118* 109* 90   Lipid Profile: Recent Labs    11/25/17 0344  CHOL 155  HDL 33*  LDLCALC 95  TRIG 137  CHOLHDL 4.7   Thyroid Function Tests: No results for input(s): TSH, T4TOTAL, FREET4, T3FREE, THYROIDAB in the last 72 hours. Anemia Panel: No results for input(s): VITAMINB12, FOLATE, FERRITIN, TIBC, IRON, RETICCTPCT in the last 72 hours. Urine analysis:    Component Value Date/Time   COLORURINE YELLOW 11/23/2017 1740   APPEARANCEUR HAZY (A) 11/23/2017 1740   LABSPEC 1.013 11/23/2017 1740   PHURINE 6.0 11/23/2017 1740   GLUCOSEU 50 (A) 11/23/2017 1740   HGBUR MODERATE (A) 11/23/2017 1740   BILIRUBINUR NEGATIVE 11/23/2017 1740   BILIRUBINUR neg 11/27/2014 1053   KETONESUR NEGATIVE 11/23/2017 1740   PROTEINUR NEGATIVE 11/23/2017 1740   UROBILINOGEN negative 11/27/2014 1053   UROBILINOGEN 1.0 09/13/2014 1845   NITRITE POSITIVE (A) 11/23/2017 1740   LEUKOCYTESUR NEGATIVE  11/23/2017 1740   Recent Results (from the past 240 hour(s))  Urine culture     Status: Abnormal (Preliminary result)   Collection Time: 11/23/17  5:40 PM  Result Value Ref Range Status   Specimen Description   Final    URINE, CATHETERIZED Performed at [facility], [address] [place], [place] [address]    Special Requests   Final    NONE Performed at [facility], [address], [place], [place] [address]    Culture (A)  Final    >=100,000 COLONIES/mL ESCHERICHIA COLI CULTURE REINCUBATED FOR BETTER GROWTH Performed at [facility], [place] [address], [place], [place] [address]    Report Status PENDING  Incomplete   Organism ID, Bacteria ESCHERICHIA COLI (A)  Final      Susceptibility   Escherichia coli - MIC*    AMPICILLIN >=32 RESISTANT Resistant     CEFAZOLIN 16 SENSITIVE Sensitive     CEFTRIAXONE <=1 SENSITIVE Sensitive     CIPROFLOXACIN >=4 RESISTANT Resistant     GENTAMICIN <=1 SENSITIVE Sensitive     IMIPENEM <=0.25 SENSITIVE Sensitive     NITROFURANTOIN <=16 SENSITIVE Sensitive     TRIMETH/SULFA >=320 RESISTANT Resistant     AMPICILLIN/SULBACTAM >=32 RESISTANT Resistant     PIP/TAZO <=4 SENSITIVE Sensitive     Extended ESBL NEGATIVE Sensitive     * >=100,000 COLONIES/mL ESCHERICHIA COLI  Culture, blood (routine x 2)     Status: None (Preliminary result)   Collection Time: 11/23/17  7:10 PM  Result Value Ref Range Status   Specimen Description BLOOD LEFT WRIST  Final   Special Requests   Final    BOTTLES DRAWN AEROBIC ONLY Blood Culture results may not be optimal due to an inadequate volume of blood received in culture bottles DRAWN BY RN   Culture   Final    NO GROWTH 2 DAYS Performed at [facility], [address], [place],  [address]    Report Status PENDING  Incomplete  Culture, blood (routine x 2)     Status: None (Preliminary result)   Collection Time: 11/23/17  7:44 PM  Result Value Ref Range Status   Specimen Description BLOOD LEFT ARM   Final   Special Requests   Final    BOTTLES DRAWN AEROBIC ONLY Blood Culture results may not be optimal due to an inadequate volume of blood received in culture bottles   Culture   Final    NO GROWTH 2 DAYS Performed at [facility], [address], [place], [place] [address]    Report Status PENDING  Incomplete  MRSA PCR Screening     Status: None   Collection Time: 11/24/17  2:54 PM  Result Value Ref Range Status   MRSA by PCR NEGATIVE NEGATIVE Final    Comment:        The GeneXpert MRSA Assay (FDA approved for NASAL specimens only), is one component of a comprehensive MRSA colonization surveillance program. It is not intended to diagnose MRSA infection nor to guide or monitor treatment for MRSA infections. Performed at [facility], [place] [address] [place], [place] [address]       Radiology Studies: Ct Angio Head W/cm &/or Wo Cm  Result Date: 11/23/2017 CLINICAL DATA:  77 y/o  F; altered mental status of unclear cause. EXAM: CT ANGIOGRAPHY HEAD AND NECK CT PERFUSION BRAIN TECHNIQUE: Multidetector CT imaging of the head and neck was performed using the standard protocol during bolus administration of intravenous contrast. Multiplanar CT image reconstructions and MIPs were obtained to evaluate the vascular anatomy. Carotid stenosis measurements (when applicable) are obtained utilizing NASCET criteria, using the distal internal carotid diameter as the denominator. Multiphase CT imaging of the brain was performed following IV bolus contrast injection. Subsequent parametric perfusion maps were calculated using RAPID software. CONTRAST:  145mL ISOVUE-370 IOPAMIDOL (ISOVUE-370) INJECTION 76% COMPARISON:  11/23/2017 CT head.  12/09/2004 MRI head. FINDINGS: CT HEAD FINDINGS Brain: Region of hypoattenuation within the right superior cerebellar hemisphere and extending into the right hemi pons compatible with late acute to subacute infarction (series 5, image 91 and series 3, image 12). No  associated hemorrhage or mass effect. No additional region of acute infarction, hemorrhage, or mass effect in the brain identified. Stable background of chronic microvascular ischemic changes and parenchymal volume loss of the supratentorial brain. Several small chronic lacunar infarcts within basal ganglia. Vascular: As below. Skull: Normal. Negative for fracture or focal lesion. Sinuses/Orbits: No acute finding. Other: None. ASPECTS ([place] Stroke Program Early CT Score) - Ganglionic level infarction (caudate, lentiform nuclei, internal capsule, insula, M1-M3 cortex): 7 - Supraganglionic infarction (M4-M6 cortex): 3 Total score (0-10 with 10 being normal): 10 Review of the MIP images confirms the above findings CTA NECK FINDINGS Aortic arch: Standard branching. Imaged portion shows no evidence of aneurysm or dissection. No significant stenosis of the major arch vessel origins. Mild calcific atherosclerosis. Right carotid system: No evidence of dissection, stenosis (50% or greater) or occlusion. Mild non stenotic calcific atherosclerosis of the carotid bifurcation. Left carotid system: No evidence of dissection, stenosis (50% or greater) or occlusion. Mild non stenotic calcific atherosclerosis of the carotid bifurcation. Vertebral arteries: Left dominant. No evidence of dissection, stenosis (50% or greater) or occlusion. Skeleton: C6-T1 severe erosive discogenic degenerative changes with endplate eburnation and grade 1 anterolisthesis at the C7-T1 level with there is severe facet arthritis. Bones are demineralized. Other neck: Multiple thyroid nodules measuring up to 9 mm on the left. Upper chest: Negative. Review of the MIP images confirms the above findings CTA HEAD FINDINGS Anterior circulation: No significant stenosis, proximal occlusion, aneurysm, or vascular malformation. The right ICA is larger than the left ICA likely due to variant anatomy where bilateral ACA are perfused by the right A1. Posterior  circulation: Left P1 short segment of mild stenosis and right P1/P2 tandem segments of severe stenosis. Irregularity and mild stenosis of the left SCA origin. The right SCA attenuates in caliber early and appears occluded (series 12, image 120 and series 15, image  26). Venous sinuses: As permitted by contrast timing, patent. Anatomic variants: Anterior communicating artery and right posterior communicating artery. No left posterior communicating artery identified, likely hypoplastic or absent. Review of the MIP images confirms the above findings. CT Brain Perfusion Findings: CBF (<30%) Volume: 36mL Perfusion (Tmax>6.0s) volume: 22mL Mismatch Volume: 23mL Infarction Location:Right hemi pons and superior cerebellar hemisphere infarction. The area of infarction on noncontrast CT of head is larger than CBF (<30%) Volume indicating pseudonormalization of a region of the core infarction. IMPRESSION: CT head: 1. 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 No hemorrhage or associated mass effect. 2. Stable supratentorial chronic microvascular ischemic changes and parenchymal volume loss of the brain. CTA neck: 1. Patent carotid and vertebral arteries. No dissection, aneurysm, or hemodynamically significant stenosis utilizing NASCET criteria. 2. Severe cervical spondylosis at the C6-T1 levels. CTA head: 1. Right SCA occlusion. 2. Tandem segments of severe stenosis of right PCA. 3. Otherwise no large vessel occlusion, aneurysm, or significant stenosis is identified. CT brain perfusion: Perfusion anomaly in the right SCA distribution. The area of infarction on noncontrast CT of head is larger than CBF (<30%) Volume indicating pseudonormalization of a region of the core infarction. These results were called by telephone at the time of interpretation on 11/23/2017 at 9:03 pm to Dr. Francine Graven , who verbally acknowledged these results. Electronically Signed   By: Kristine Garbe  M.D.   On: 11/23/2017 21:07   Dg Chest 1 View  Result Date: 11/23/2017 CLINICAL DATA:  Altered mental status EXAM: CHEST  1 VIEW COMPARISON:  09/13/2014, 08/13/2014 FINDINGS: Mildly low lung volume. Small left pleural effusion with dense airspace disease at the lingula and left base. Cardiomegaly with aortic atherosclerosis. Minimal vascular congestion. No pneumothorax. IMPRESSION: 1. Mild left pleural effusion or thickening. Airspace disease at the lingula and left base, some of this may be chronic but difficult to exclude an acute infiltrate in the region 2. Cardiomegaly with mild vascular congestion Electronically Signed   By: Donavan Foil M.D.   On: 11/23/2017 18:29   Ct Head Wo Contrast  Result Date: 11/23/2017 CLINICAL DATA:  Lethargy following hydrocodone administration EXAM: CT HEAD WITHOUT CONTRAST CT CERVICAL SPINE WITHOUT CONTRAST TECHNIQUE: Multidetector CT imaging of the head and cervical spine was performed following the standard protocol without intravenous contrast. Multiplanar CT image reconstructions of the cervical spine were also generated. COMPARISON:  11/15/2007 FINDINGS: CT HEAD FINDINGS Brain: No evidence of acute infarction, hemorrhage, hydrocephalus, extra-axial collection or mass lesion/mass effect. Chronic atrophic and ischemic changes are noted. Vascular: No hyperdense vessel or unexpected calcification. Skull: Normal. Negative for fracture or focal lesion. Sinuses/Orbits: No acute finding. Other: None. CT CERVICAL SPINE FINDINGS Alignment: Alignment is within normal limits with the exception of degenerative anterolisthesis of C7 on T1. Skull base and vertebrae: 7 cervical segments are noted. Disc space narrowing is noted at C5-6, C6-7 and C7-T1. Significant sclerosis is noted at the C7-T1 interspace. Degenerative anterolisthesis is seen at C7-T1. Associated central canal stenosis is noted at this level. No acute fracture or acute facet abnormality is noted. Facet hypertrophic  changes are noted at multiple levels. Soft tissues and spinal canal: No prevertebral fluid or swelling. No visible canal hematoma. Upper chest: Within normal limits. Other: None IMPRESSION: CT of the head: Chronic atrophic and ischemic changes without acute abnormality. CT of the cervical spine: Multilevel degenerative change without acute abnormality Electronically Signed   By: Inez Catalina M.D.   On: 11/23/2017 18:33  Ct Angio Neck W And/or Wo Contrast  Result Date: 11/23/2017 CLINICAL DATA:  77 y/o  F; altered mental status of unclear cause. EXAM: CT ANGIOGRAPHY HEAD AND NECK CT PERFUSION BRAIN TECHNIQUE: Multidetector CT imaging of the head and neck was performed using the standard protocol during bolus administration of intravenous contrast. Multiplanar CT image reconstructions and MIPs were obtained to evaluate the vascular anatomy. Carotid stenosis measurements (when applicable) are obtained utilizing NASCET criteria, using the distal internal carotid diameter as the denominator. Multiphase CT imaging of the brain was performed following IV bolus contrast injection. Subsequent parametric perfusion maps were calculated using RAPID software. CONTRAST:  121mL ISOVUE-370 IOPAMIDOL (ISOVUE-370) INJECTION 76% COMPARISON:  11/23/2017 CT head.  12/09/2004 MRI head. FINDINGS: CT HEAD FINDINGS Brain: Region of hypoattenuation within the right superior cerebellar hemisphere and extending into the right hemi pons compatible with late acute to subacute infarction (series 5, image 48 and series 3, image 12). No associated hemorrhage or mass effect. No additional region of acute infarction, hemorrhage, or mass effect in the brain identified. Stable background of chronic microvascular ischemic changes and parenchymal volume loss of the supratentorial brain. Several small chronic lacunar infarcts within basal ganglia. Vascular: As below. Skull: Normal. Negative for fracture or focal lesion. Sinuses/Orbits: No acute  finding. Other: None. ASPECTS ([place] Stroke Program Early CT Score) - Ganglionic level infarction (caudate, lentiform nuclei, internal capsule, insula, M1-M3 cortex): 7 - Supraganglionic infarction (M4-M6 cortex): 3 Total score (0-10 with 10 being normal): 10 Review of the MIP images confirms the above findings CTA NECK FINDINGS Aortic arch: Standard branching. Imaged portion shows no evidence of aneurysm or dissection. No significant stenosis of the major arch vessel origins. Mild calcific atherosclerosis. Right carotid system: No evidence of dissection, stenosis (50% or greater) or occlusion. Mild non stenotic calcific atherosclerosis of the carotid bifurcation. Left carotid system: No evidence of dissection, stenosis (50% or greater) or occlusion. Mild non stenotic calcific atherosclerosis of the carotid bifurcation. Vertebral arteries: Left dominant. No evidence of dissection, stenosis (50% or greater) or occlusion. Skeleton: C6-T1 severe erosive discogenic degenerative changes with endplate eburnation and grade 1 anterolisthesis at the C7-T1 level with there is severe facet arthritis. Bones are demineralized. Other neck: Multiple thyroid nodules measuring up to 9 mm on the left. Upper chest: Negative. Review of the MIP images confirms the above findings CTA HEAD FINDINGS Anterior circulation: No significant stenosis, proximal occlusion, aneurysm, or vascular malformation. The right ICA is larger than the left ICA likely due to variant anatomy where bilateral ACA are perfused by the right A1. Posterior circulation: Left P1 short segment of mild stenosis and right P1/P2 tandem segments of severe stenosis. Irregularity and mild stenosis of the left SCA origin. The right SCA attenuates in caliber early and appears occluded (series 12, image 120 and series 15, image 26). Venous sinuses: As permitted by contrast timing, patent. Anatomic variants: Anterior communicating artery and right posterior communicating  artery. No left posterior communicating artery identified, likely hypoplastic or absent. Review of the MIP images confirms the above findings. CT Brain Perfusion Findings: CBF (<30%) Volume: 54mL Perfusion (Tmax>6.0s) volume: 110mL Mismatch Volume: 55mL Infarction Location:Right hemi pons and superior cerebellar hemisphere infarction. The area of infarction on noncontrast CT of head is larger than CBF (<30%) Volume indicating pseudonormalization of a region of the core infarction. IMPRESSION: CT head: 1. 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 No hemorrhage or associated mass effect. 2. Stable supratentorial chronic microvascular ischemic changes  and parenchymal volume loss of the brain. CTA neck: 1. Patent carotid and vertebral arteries. No dissection, aneurysm, or hemodynamically significant stenosis utilizing NASCET criteria. 2. Severe cervical spondylosis at the C6-T1 levels. CTA head: 1. Right SCA occlusion. 2. Tandem segments of severe stenosis of right PCA. 3. Otherwise no large vessel occlusion, aneurysm, or significant stenosis is identified. CT brain perfusion: Perfusion anomaly in the right SCA distribution. The area of infarction on noncontrast CT of head is larger than CBF (<30%) Volume indicating pseudonormalization of a region of the core infarction. These results were called by telephone at the time of interpretation on 11/23/2017 at 9:03 pm to Dr. Francine Graven , who verbally acknowledged these results. Electronically Signed   By: Kristine Garbe M.D.   On: 11/23/2017 21:07   Ct Cervical Spine Wo Contrast  Result Date: 11/23/2017 CLINICAL DATA:  Lethargy following hydrocodone administration EXAM: CT HEAD WITHOUT CONTRAST CT CERVICAL SPINE WITHOUT CONTRAST TECHNIQUE: Multidetector CT imaging of the head and cervical spine was performed following the standard protocol without intravenous contrast. Multiplanar CT image reconstructions of the  cervical spine were also generated. COMPARISON:  11/15/2007 FINDINGS: CT HEAD FINDINGS Brain: No evidence of acute infarction, hemorrhage, hydrocephalus, extra-axial collection or mass lesion/mass effect. Chronic atrophic and ischemic changes are noted. Vascular: No hyperdense vessel or unexpected calcification. Skull: Normal. Negative for fracture or focal lesion. Sinuses/Orbits: No acute finding. Other: None. CT CERVICAL SPINE FINDINGS Alignment: Alignment is within normal limits with the exception of degenerative anterolisthesis of C7 on T1. Skull base and vertebrae: 7 cervical segments are noted. Disc space narrowing is noted at C5-6, C6-7 and C7-T1. Significant sclerosis is noted at the C7-T1 interspace. Degenerative anterolisthesis is seen at C7-T1. Associated central canal stenosis is noted at this level. No acute fracture or acute facet abnormality is noted. Facet hypertrophic changes are noted at multiple levels. Soft tissues and spinal canal: No prevertebral fluid or swelling. No visible canal hematoma. Upper chest: Within normal limits. Other: None IMPRESSION: CT of the head: Chronic atrophic and ischemic changes without acute abnormality. CT of the cervical spine: Multilevel degenerative change without acute abnormality Electronically Signed   By: Inez Catalina M.D.   On: 11/23/2017 18:33   Mr Brain Wo Contrast  Result Date: 11/24/2017 CLINICAL DATA:  Altered mental status EXAM: MRI HEAD WITHOUT CONTRAST TECHNIQUE: Multiplanar, multiecho pulse sequences of the brain and surrounding structures were obtained without intravenous contrast. COMPARISON:  Head CT 11/23/2017 FINDINGS: BRAIN: The midline structures are normal. There is abnormal diffusion restriction within the dorsolateral right pons and superior right cerebellum, within the right superior cerebellar artery territory, as demonstrated on the earlier CT. No mass lesion, hydrocephalus, dural abnormality or extra-axial collection. Early confluent  hyperintense T2-weighted signal of the periventricular and deep white matter, most commonly due to chronic ischemic microangiopathy. No age-advanced or lobar predominant atrophy. There are a few central chronic microhemorrhages. VASCULAR: Major intracranial arterial and venous sinus flow voids are preserved. SKULL AND UPPER CERVICAL SPINE: The visualized skull base, calvarium, upper cervical spine and extracranial soft tissues are normal. SINUSES/ORBITS: No fluid levels or advanced mucosal thickening. No mastoid or middle ear effusion. Normal orbits. IMPRESSION: 1. 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 2. No hemorrhage or mass effect. 3. Chronic ischemic microangiopathy. Electronically Signed   By: Ulyses Jarred M.D.   On: 11/24/2017 06:03   Ct Cerebral Perfusion W Contrast  Result Date: 11/23/2017 CLINICAL  DATA:  77 y/o  F; altered mental status of unclear cause. EXAM: CT ANGIOGRAPHY HEAD AND NECK CT PERFUSION BRAIN TECHNIQUE: Multidetector CT imaging of the head and neck was performed using the standard protocol during bolus administration of intravenous contrast. Multiplanar CT image reconstructions and MIPs were obtained to evaluate the vascular anatomy. Carotid stenosis measurements (when applicable) are obtained utilizing NASCET criteria, using the distal internal carotid diameter as the denominator. Multiphase CT imaging of the brain was performed following IV bolus contrast injection. Subsequent parametric perfusion maps were calculated using RAPID software. CONTRAST:  171mL ISOVUE-370 IOPAMIDOL (ISOVUE-370) INJECTION 76% COMPARISON:  11/23/2017 CT head.  12/09/2004 MRI head. FINDINGS: CT HEAD FINDINGS Brain: Region of hypoattenuation within the right superior cerebellar hemisphere and extending into the right hemi pons compatible with late acute to subacute infarction (series 5, image 65 and series 3, image 12). No  associated hemorrhage or mass effect. No additional region of acute infarction, hemorrhage, or mass effect in the brain identified. Stable background of chronic microvascular ischemic changes and parenchymal volume loss of the supratentorial brain. Several small chronic lacunar infarcts within basal ganglia. Vascular: As below. Skull: Normal. Negative for fracture or focal lesion. Sinuses/Orbits: No acute finding. Other: None. ASPECTS ([place] Stroke Program Early CT Score) - Ganglionic level infarction (caudate, lentiform nuclei, internal capsule, insula, M1-M3 cortex): 7 - Supraganglionic infarction (M4-M6 cortex): 3 Total score (0-10 with 10 being normal): 10 Review of the MIP images confirms the above findings CTA NECK FINDINGS Aortic arch: Standard branching. Imaged portion shows no evidence of aneurysm or dissection. No significant stenosis of the major arch vessel origins. Mild calcific atherosclerosis. Right carotid system: No evidence of dissection, stenosis (50% or greater) or occlusion. Mild non stenotic calcific atherosclerosis of the carotid bifurcation. Left carotid system: No evidence of dissection, stenosis (50% or greater) or occlusion. Mild non stenotic calcific atherosclerosis of the carotid bifurcation. Vertebral arteries: Left dominant. No evidence of dissection, stenosis (50% or greater) or occlusion. Skeleton: C6-T1 severe erosive discogenic degenerative changes with endplate eburnation and grade 1 anterolisthesis at the C7-T1 level with there is severe facet arthritis. Bones are demineralized. Other neck: Multiple thyroid nodules measuring up to 9 mm on the left. Upper chest: Negative. Review of the MIP images confirms the above findings CTA HEAD FINDINGS Anterior circulation: No significant stenosis, proximal occlusion, aneurysm, or vascular malformation. The right ICA is larger than the left ICA likely due to variant anatomy where bilateral ACA are perfused by the right A1. Posterior  circulation: Left P1 short segment of mild stenosis and right P1/P2 tandem segments of severe stenosis. Irregularity and mild stenosis of the left SCA origin. The right SCA attenuates in caliber early and appears occluded (series 12, image 120 and series 15, image 26). Venous sinuses: As permitted by contrast timing, patent. Anatomic variants: Anterior communicating artery and right posterior communicating artery. No left posterior communicating artery identified, likely hypoplastic or absent. Review of the MIP images confirms the above findings. CT Brain Perfusion Findings: CBF (<30%) Volume: 80mL Perfusion (Tmax>6.0s) volume: 39mL Mismatch Volume: 42mL Infarction Location:Right hemi pons and superior cerebellar hemisphere infarction. The area of infarction on noncontrast CT of head is larger than CBF (<30%) Volume indicating pseudonormalization of a region of the core infarction. IMPRESSION: CT head: 1. 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 No hemorrhage or associated mass effect. 2. Stable supratentorial chronic microvascular ischemic changes and parenchymal volume loss of the brain. CTA neck: 1. Patent carotid  and vertebral arteries. No dissection, aneurysm, or hemodynamically significant stenosis utilizing NASCET criteria. 2. Severe cervical spondylosis at the C6-T1 levels. CTA head: 1. Right SCA occlusion. 2. Tandem segments of severe stenosis of right PCA. 3. Otherwise no large vessel occlusion, aneurysm, or significant stenosis is identified. CT brain perfusion: Perfusion anomaly in the right SCA distribution. The area of infarction on noncontrast CT of head is larger than CBF (<30%) Volume indicating pseudonormalization of a region of the core infarction. These results were called by telephone at the time of interpretation on 11/23/2017 at 9:03 pm to Dr. Francine Graven , who verbally acknowledged these results. Electronically Signed   By: Kristine Garbe  M.D.   On: 11/23/2017 21:07   Dg Swallowing Func-speech Pathology  Result Date: 11/25/2017 Objective Swallowing Evaluation: Type of Study: MBS-Modified Barium Swallow Study  Patient Details Name: MONCIA ANNAS MRN: [id number] Date of Birth: September 26, 1940 Today's Date: 11/25/2017 Time: SLP Start Time (ACUTE ONLY): 1046 -SLP Stop Time (ACUTE ONLY): 1059 SLP Time Calculation (min) (ACUTE ONLY): 13 min Past Medical History: Past Medical History: Diagnosis Date . Arthritis  . Atrial fibrillation ([place])  . Atrial fibrillation with RVR ([place]) 09/13/2014 . Chronic pain  . Diabetes mellitus without complication ([place])  . DVT (deep venous thrombosis) ([place])  . Hiatal hernia  . Hypertension  . Osteoporosis  . Pulmonary emboli ([place])  . Sarcoidosis  . Vertigo  . Wheelchair bound  Past Surgical History: Past Surgical History: Procedure Laterality Date . ABDOMINAL HYSTERECTOMY   . I&D EXTREMITY Left 09/13/2014  Procedure: IRRIGATION AND DEBRIDEMENT EXTREMITY;  Surgeon: Linna Hoff, MD;  Location: [facility];  Service: Orthopedics;  Laterality: Left; . INTRAMEDULLARY (IM) NAIL INTERTROCHANTERIC Left 06/27/2015  Procedure: INTRAMEDULLARY (IM) NAIL INTERTROCHANTRIC AFFIXIS;  Surgeon: Marybelle Killings, MD;  Location: [facility];  Service: Orthopedics;  Laterality: Left; . JOINT REPLACEMENT    toe . LUNG LOBECTOMY Left   Lower HPI: 77 year old female with medical history significant for type 2 diabetes mellitus, hypertension, chronic pain on opiate analgesia, pulmonary embolism, sarcoidosis initially presented to the ED poorly responsive in setting of questionable opiod overdose, found to have neuro change. MRI acute infarct of the superior right cerebellum and right dorso lateral pons, in the superior cerebellar artery territory. CXR mild left pleural effusion or thickening. Airspace disease at the lingula and left base, some of this may be chronic but difficult to exclude an acute infiltrate in the region, cardiomegaly with mild vascular congestion.  No  data recorded Assessment / Plan / Recommendation CHL IP CLINICAL IMPRESSIONS 11/25/2017 Clinical Impression Moderate oral dysphagia marked by anterior spill, decreased control and delayed oral transit. Pharyngeal phase characterized by sensory impairments with late swallow initiation at the pyriform sinuses with thinner consistencies and valleculae with honey thick. Laryngeal penetration to vocal cords consistently with thin and nectar barium without sensation and ineffective attempts at cued throat clear/cough. No appreciable post swallow residue. Positioning somewhat challenging due to kyphosis. Recommend honey thick liquids, Dys 2 texture, crush pills and full assist and supervision with ST for safety and education.   SLP Visit Diagnosis Dysphagia, oropharyngeal phase (R13.12) Attention and concentration deficit following -- Frontal lobe and executive function deficit following -- Impact on safety and function Moderate aspiration risk   CHL IP TREATMENT RECOMMENDATION 11/25/2017 Treatment Recommendations Therapy as outlined in treatment plan below   Prognosis 11/25/2017 Prognosis for Safe Diet Advancement (No Data) Barriers to Reach Goals Cognitive deficits Barriers/Prognosis Comment -- CHL IP DIET RECOMMENDATION 11/25/2017  SLP Diet Recommendations Dysphagia 2 (Fine chop) solids;Honey thick liquids Liquid Administration via Cup Medication Administration Crushed with puree Compensations Slow rate;Small sips/bites;Lingual sweep for clearance of pocketing Postural Changes Seated upright at 90 degrees   CHL IP OTHER RECOMMENDATIONS 11/25/2017 Recommended Consults -- Oral Care Recommendations Oral care BID Other Recommendations --   CHL IP FOLLOW UP RECOMMENDATIONS 11/25/2017 Follow up Recommendations Other (comment)   CHL IP FREQUENCY AND DURATION 11/25/2017 Speech Therapy Frequency (ACUTE ONLY) min 2x/week Treatment Duration 2 weeks      CHL IP ORAL PHASE 11/25/2017 Oral Phase Impaired Oral - Pudding Teaspoon -- Oral -  Pudding Cup -- Oral - Honey Teaspoon -- Oral - Honey Cup Decreased bolus cohesion;Delayed oral transit;Weak lingual manipulation;Left anterior bolus loss Oral - Nectar Teaspoon -- Oral - Nectar Cup Weak lingual manipulation;Delayed oral transit;Decreased bolus cohesion;Left anterior bolus loss Oral - Nectar Straw -- Oral - Thin Teaspoon -- Oral - Thin Cup -- Oral - Thin Straw WFL Oral - Puree Delayed oral transit Oral - Mech Soft -- Oral - Regular Weak lingual manipulation;Delayed oral transit Oral - Multi-Consistency -- Oral - Pill -- Oral Phase - Comment --  CHL IP PHARYNGEAL PHASE 11/25/2017 Pharyngeal Phase Impaired Pharyngeal- Pudding Teaspoon -- Pharyngeal -- Pharyngeal- Pudding Cup -- Pharyngeal -- Pharyngeal- Honey Teaspoon -- Pharyngeal -- Pharyngeal- Honey Cup -- Pharyngeal -- Pharyngeal- Nectar Teaspoon -- Pharyngeal -- Pharyngeal- Nectar Cup Delayed swallow initiation-pyriform sinuses;Penetration/Aspiration during swallow Pharyngeal Material enters airway, CONTACTS cords and not ejected out Pharyngeal- Nectar Straw -- Pharyngeal -- Pharyngeal- Thin Teaspoon -- Pharyngeal -- Pharyngeal- Thin Cup -- Pharyngeal -- Pharyngeal- Thin Straw Delayed swallow initiation-pyriform sinuses;Penetration/Aspiration during swallow Pharyngeal Material enters airway, CONTACTS cords and not ejected out Pharyngeal- Puree Penetration/Aspiration during swallow Pharyngeal Material enters airway, remains ABOVE vocal cords and not ejected out Pharyngeal- Mechanical Soft -- Pharyngeal -- Pharyngeal- Regular Delayed swallow initiation-vallecula Pharyngeal -- Pharyngeal- Multi-consistency -- Pharyngeal -- Pharyngeal- Pill -- Pharyngeal -- Pharyngeal Comment --  CHL IP CERVICAL ESOPHAGEAL PHASE 11/25/2017 Cervical Esophageal Phase WFL Pudding Teaspoon -- Pudding Cup -- Honey Teaspoon -- Honey Cup -- Nectar Teaspoon -- Nectar Cup -- Nectar Straw -- Thin Teaspoon -- Thin Cup -- Thin Straw -- Puree -- Mechanical Soft -- Regular --  Multi-consistency -- Pill -- Cervical Esophageal Comment -- No flowsheet data found. [place] Siren 11/25/2017, 11:56 AM Orbie Pyo Colvin Caroli.Ed CCC-SLP Pager [phone]               Scheduled Meds: . aspirin EC  325 mg Oral Daily   Or  . aspirin  300 mg Rectal Daily  . atorvastatin  20 mg Oral q1800  . calcium-vitamin D  1 tablet Oral Q breakfast  . chlorhexidine  15 mL Mouth Rinse BID  . enoxaparin (LOVENOX) injection  40 mg Subcutaneous Q24H  . feeding supplement  1 Container Oral BID BM  . insulin aspart  0-9 Units Subcutaneous Q4H  . mouth rinse  15 mL Mouth Rinse q12n4p  . predniSONE  5 mg Oral Q breakfast  . senna  2 tablet Oral QHS  . sodium chloride flush  3 mL Intravenous Q12H   Continuous Infusions: . cefTRIAXone (ROCEPHIN)  IV 1 g (11/25/17 1158)     LOS: 2 days   Time spent: 25 minutes.  Vance Gather, MD [facility] Pager [phone]  If 7PM-7AM, please contact night-coverage [email or web address] Password [email or web address] 11/25/2017, 12:00 PM

## 2017-11-25 NOTE — Clinical Social Work Note (Signed)
Clinical Social Work Assessment  Patient Details  Name: Elizabeth Drake MRN: [id number] Date of Birth: May 15, 1941  Date of referral:  11/25/17               Reason for consult:  Facility Placement                Permission sought to share information with:  Family Supports Permission granted to share information::  Yes, Release of Information Signed  Name::     [place]  Agency::  [employer]  Relationship::  Spouse  Contact Information:     Housing/Transportation Living arrangements for the past 2 months:  Single Family Home Source of Information:  Spouse Patient Interpreter Needed:  None Criminal Activity/Legal Involvement Pertinent to Current Situation/Hospitalization:  No - Comment as needed Significant Relationships:  Spouse Lives with:  Spouse Do you feel safe going back to the place where you live?  No Need for family participation in patient care:  No (Coment)  Care giving concerns:  Pt is only alert to self. Pt's spouse at bedside. Pt lives at home with spouse, no additional caregivers noted at this time.   Social Worker assessment / plan:  CSW spoke with pt's spouse at bedside. Pt's spouse states pt has been to [facility] in the past and they would prefer for pt to go there again. CSW will follow up with facility and pt's spouse regarding bed offer.  Employment status:  Retired Forensic scientist:  Medicare PT Recommendations:  [place] / Referral to community resources:  [place]  Patient/Family's Response to care:  Pt's spouse verbalized understanding of CSW role and expressed appreciation for support. Pt's spouse denies any concern regarding pt care at this time.   Patient/Family's Understanding of and Emotional Response to Diagnosis, Current Treatment, and Prognosis:  Pt's spouse understanding and realistic regarding pt's physical limitations. Pt's spouse understands the need for pt to go to SNF at d/c--pt's spouse  agreeable. Pt's spouse denies any concern regarding pt's treatment plan at this time. CSW will continue to provide support and facilitate d/c needs.   Emotional Assessment Appearance:  Appears stated age Attitude/Demeanor/Rapport:  Unable to Assess Affect (typically observed):  Unable to Assess Orientation:  Oriented to Self Alcohol / Substance use:  Not Applicable Psych involvement (Current and /or in the community):  No (Comment)  Discharge Needs  Concerns to be addressed:  Basic Needs, Care Coordination Readmission within the last 30 days:  No Current discharge risk:  Dependent with Mobility Barriers to Discharge:  Continued Medical Work up   [employer], LCSW 11/25/2017, 2:20 PM

## 2017-11-26 DIAGNOSIS — J189 Pneumonia, unspecified organism: Secondary | ICD-10-CM | POA: Diagnosis present

## 2017-11-26 DIAGNOSIS — G9340 Encephalopathy, unspecified: Secondary | ICD-10-CM | POA: Diagnosis not present

## 2017-11-26 DIAGNOSIS — Z87891 Personal history of nicotine dependence: Secondary | ICD-10-CM | POA: Diagnosis not present

## 2017-11-26 DIAGNOSIS — Z7189 Other specified counseling: Secondary | ICD-10-CM | POA: Diagnosis not present

## 2017-11-26 DIAGNOSIS — E43 Unspecified severe protein-calorie malnutrition: Secondary | ICD-10-CM | POA: Diagnosis not present

## 2017-11-26 DIAGNOSIS — M6281 Muscle weakness (generalized): Secondary | ICD-10-CM | POA: Diagnosis not present

## 2017-11-26 DIAGNOSIS — Z9181 History of falling: Secondary | ICD-10-CM | POA: Diagnosis not present

## 2017-11-26 DIAGNOSIS — Z66 Do not resuscitate: Secondary | ICD-10-CM | POA: Diagnosis present

## 2017-11-26 DIAGNOSIS — I82409 Acute embolism and thrombosis of unspecified deep veins of unspecified lower extremity: Secondary | ICD-10-CM | POA: Diagnosis not present

## 2017-11-26 DIAGNOSIS — N3281 Overactive bladder: Secondary | ICD-10-CM | POA: Diagnosis not present

## 2017-11-26 DIAGNOSIS — E1165 Type 2 diabetes mellitus with hyperglycemia: Secondary | ICD-10-CM | POA: Diagnosis present

## 2017-11-26 DIAGNOSIS — N3000 Acute cystitis without hematuria: Secondary | ICD-10-CM

## 2017-11-26 DIAGNOSIS — Z872 Personal history of diseases of the skin and subcutaneous tissue: Secondary | ICD-10-CM | POA: Diagnosis not present

## 2017-11-26 DIAGNOSIS — R0602 Shortness of breath: Secondary | ICD-10-CM | POA: Diagnosis not present

## 2017-11-26 DIAGNOSIS — R627 Adult failure to thrive: Secondary | ICD-10-CM | POA: Diagnosis not present

## 2017-11-26 DIAGNOSIS — A419 Sepsis, unspecified organism: Secondary | ICD-10-CM | POA: Diagnosis present

## 2017-11-26 DIAGNOSIS — R4182 Altered mental status, unspecified: Secondary | ICD-10-CM | POA: Diagnosis not present

## 2017-11-26 DIAGNOSIS — G464 Cerebellar stroke syndrome: Secondary | ICD-10-CM | POA: Diagnosis not present

## 2017-11-26 DIAGNOSIS — I10 Essential (primary) hypertension: Secondary | ICD-10-CM | POA: Diagnosis not present

## 2017-11-26 DIAGNOSIS — I63441 Cerebral infarction due to embolism of right cerebellar artery: Secondary | ICD-10-CM | POA: Diagnosis not present

## 2017-11-26 DIAGNOSIS — Z681 Body mass index (BMI) 19 or less, adult: Secondary | ICD-10-CM | POA: Diagnosis not present

## 2017-11-26 DIAGNOSIS — G9341 Metabolic encephalopathy: Secondary | ICD-10-CM | POA: Diagnosis present

## 2017-11-26 DIAGNOSIS — R278 Other lack of coordination: Secondary | ICD-10-CM | POA: Diagnosis not present

## 2017-11-26 DIAGNOSIS — R401 Stupor: Secondary | ICD-10-CM | POA: Diagnosis not present

## 2017-11-26 DIAGNOSIS — Z515 Encounter for palliative care: Secondary | ICD-10-CM | POA: Diagnosis not present

## 2017-11-26 DIAGNOSIS — D638 Anemia in other chronic diseases classified elsewhere: Secondary | ICD-10-CM | POA: Diagnosis present

## 2017-11-26 DIAGNOSIS — R4020 Unspecified coma: Secondary | ICD-10-CM | POA: Diagnosis not present

## 2017-11-26 DIAGNOSIS — D869 Sarcoidosis, unspecified: Secondary | ICD-10-CM | POA: Diagnosis not present

## 2017-11-26 DIAGNOSIS — E785 Hyperlipidemia, unspecified: Secondary | ICD-10-CM | POA: Diagnosis not present

## 2017-11-26 DIAGNOSIS — I6340 Cerebral infarction due to embolism of unspecified cerebral artery: Secondary | ICD-10-CM | POA: Diagnosis not present

## 2017-11-26 DIAGNOSIS — K5732 Diverticulitis of large intestine without perforation or abscess without bleeding: Secondary | ICD-10-CM | POA: Diagnosis not present

## 2017-11-26 DIAGNOSIS — Z86711 Personal history of pulmonary embolism: Secondary | ICD-10-CM | POA: Diagnosis not present

## 2017-11-26 DIAGNOSIS — G92 Toxic encephalopathy: Secondary | ICD-10-CM | POA: Diagnosis not present

## 2017-11-26 DIAGNOSIS — E860 Dehydration: Secondary | ICD-10-CM | POA: Diagnosis not present

## 2017-11-26 DIAGNOSIS — R918 Other nonspecific abnormal finding of lung field: Secondary | ICD-10-CM | POA: Diagnosis not present

## 2017-11-26 DIAGNOSIS — R1310 Dysphagia, unspecified: Secondary | ICD-10-CM | POA: Diagnosis present

## 2017-11-26 DIAGNOSIS — I69391 Dysphagia following cerebral infarction: Secondary | ICD-10-CM | POA: Diagnosis not present

## 2017-11-26 DIAGNOSIS — Z993 Dependence on wheelchair: Secondary | ICD-10-CM | POA: Diagnosis not present

## 2017-11-26 DIAGNOSIS — R402431 Glasgow coma scale score 3-8, in the field [EMT or ambulance]: Secondary | ICD-10-CM | POA: Diagnosis not present

## 2017-11-26 DIAGNOSIS — G894 Chronic pain syndrome: Secondary | ICD-10-CM | POA: Diagnosis not present

## 2017-11-26 DIAGNOSIS — I69321 Dysphasia following cerebral infarction: Secondary | ICD-10-CM | POA: Diagnosis not present

## 2017-11-26 DIAGNOSIS — L89151 Pressure ulcer of sacral region, stage 1: Secondary | ICD-10-CM | POA: Diagnosis present

## 2017-11-26 DIAGNOSIS — E1159 Type 2 diabetes mellitus with other circulatory complications: Secondary | ICD-10-CM | POA: Diagnosis not present

## 2017-11-26 DIAGNOSIS — M1990 Unspecified osteoarthritis, unspecified site: Secondary | ICD-10-CM | POA: Diagnosis not present

## 2017-11-26 DIAGNOSIS — Z881 Allergy status to other antibiotic agents status: Secondary | ICD-10-CM | POA: Diagnosis not present

## 2017-11-26 DIAGNOSIS — N390 Urinary tract infection, site not specified: Secondary | ICD-10-CM | POA: Diagnosis not present

## 2017-11-26 DIAGNOSIS — E870 Hyperosmolality and hypernatremia: Secondary | ICD-10-CM | POA: Diagnosis not present

## 2017-11-26 DIAGNOSIS — E119 Type 2 diabetes mellitus without complications: Secondary | ICD-10-CM | POA: Diagnosis not present

## 2017-11-26 DIAGNOSIS — I480 Paroxysmal atrial fibrillation: Secondary | ICD-10-CM | POA: Diagnosis not present

## 2017-11-26 DIAGNOSIS — I2699 Other pulmonary embolism without acute cor pulmonale: Secondary | ICD-10-CM | POA: Diagnosis not present

## 2017-11-26 DIAGNOSIS — M810 Age-related osteoporosis without current pathological fracture: Secondary | ICD-10-CM | POA: Diagnosis present

## 2017-11-26 DIAGNOSIS — I6389 Other cerebral infarction: Secondary | ICD-10-CM | POA: Diagnosis not present

## 2017-11-26 DIAGNOSIS — E876 Hypokalemia: Secondary | ICD-10-CM | POA: Diagnosis not present

## 2017-11-26 DIAGNOSIS — J181 Lobar pneumonia, unspecified organism: Secondary | ICD-10-CM | POA: Diagnosis not present

## 2017-11-26 LAB — BASIC METABOLIC PANEL
Anion gap: 9 (ref 5–15)
BUN: <5 mg/dL — ABNORMAL LOW (ref 6–20)
CHLORIDE: 102 mmol/L (ref 101–111)
CO2: 24 mmol/L (ref 22–32)
Calcium: 9.9 mg/dL (ref 8.9–10.3)
Creatinine, Ser: 0.65 mg/dL (ref 0.44–1.00)
GFR calc Af Amer: >60 mL/min (ref >60)
GLUCOSE: 104 mg/dL — AB (ref 65–99)
POTASSIUM: 3.1 mmol/L — AB (ref 3.5–5.1)
Sodium: 135 mmol/L (ref 135–145)

## 2017-11-26 LAB — GLUCOSE, CAPILLARY
GLUCOSE-CAPILLARY: 104 mg/dL — AB (ref 65–99)
GLUCOSE-CAPILLARY: 106 mg/dL — AB (ref 65–99)
GLUCOSE-CAPILLARY: 138 mg/dL — AB (ref 65–99)
Glucose-Capillary: 104 mg/dL — ABNORMAL HIGH (ref 65–99)
Glucose-Capillary: 108 mg/dL — ABNORMAL HIGH (ref 65–99)

## 2017-11-26 MED ORDER — RIVAROXABAN 20 MG PO TABS: ORAL_TABLET | ORAL | Status: DC

## 2017-11-26 MED ORDER — HYDROCODONE-ACETAMINOPHEN 10-325 MG PO TABS: 1.0000 | ORAL_TABLET | Freq: Two times a day (BID) | ORAL | 0 refills | Status: DC | PRN

## 2017-11-26 MED ORDER — ASPIRIN 325 MG PO TBEC: 325.0000 mg | DELAYED_RELEASE_TABLET | Freq: Every day | ORAL | 0 refills | Status: DC

## 2017-11-26 MED ORDER — NITROFURANTOIN MONOHYD MACRO 100 MG PO CAPS: 100.0000 mg | ORAL_CAPSULE | Freq: Two times a day (BID) | ORAL | 0 refills | Status: DC

## 2017-11-26 MED ORDER — POTASSIUM CHLORIDE CRYS ER 20 MEQ PO TBCR
40.0000 meq | EXTENDED_RELEASE_TABLET | Freq: Once | ORAL | Status: AC
  Administered 2017-11-26: 40 meq via ORAL
  Filled 2017-11-26: qty 2

## 2017-11-26 NOTE — Care Management Note (Signed)
Case Management Note  Patient Details  Name: Elizabeth Drake MRN: [id number] Date of Birth: May 22, 1941  Subjective/Objective:                    Action/Plan: Pt discharging to [facility]. CM signing off.    Expected Discharge Date:  11/26/17               Expected Discharge Plan:  Skilled Nursing Facility  In-House Referral:  Clinical Social Work  Discharge planning Services     Post Acute Care Choice:    Choice offered to:     DME Arranged:    DME Agency:     HH Arranged:    [place] Agency:     Status of Service:  Completed, signed off  If discussed at [employer], dates discussed:    Additional Comments:  Pollie Friar, RN 11/26/2017, 1:02 PM

## 2017-11-26 NOTE — Clinical Social Work Placement (Signed)
   CLINICAL SOCIAL WORK PLACEMENT  NOTE [name]  Date:  11/26/2017  Patient Details  Name: Elizabeth Drake MRN: [id number] Date of Birth: 1941-03-01  Clinical Social Work is seeking post-discharge placement for this patient at the [place] level of care (*CSW will initial, date and re-position this form in  chart as items are completed):      Patient/family provided with [place] Work Department's list of facilities offering this level of care within the geographic area requested by the patient (or if unable, by the patient's family).  Yes   Patient/family informed of their freedom to choose among providers that offer the needed level of care, that participate in Medicare, Medicaid or managed care program needed by the patient, have an available bed and are willing to accept the patient.      Patient/family informed of [facility]'s ownership interest in [facility] and [facility], as well as of the fact that they are under no obligation to receive care at these facilities.  PASRR submitted to EDS on       PASRR number received on       Existing PASRR number confirmed on 11/25/17     FL2 transmitted to all facilities in geographic area requested by pt/family on 11/25/17     FL2 transmitted to all facilities within larger geographic area on       Patient informed that his/her managed care company has contracts with or will negotiate with certain facilities, including the following:        Yes   Patient/family informed of bed offers received.  Patient chooses bed at [facility]     Physician recommends and patient chooses bed at      Patient to be transferred to [facility] on 11/26/17.  Patient to be transferred to facility by [facility]     Patient family notified on 11/26/17 of transfer.  Name of family member notified:  husband at bedside     PHYSICIAN       Additional Comment:     _______________________________________________ Alexander Mt, [place] 11/26/2017, 5:24 PM

## 2017-11-26 NOTE — Plan of Care (Signed)
Pts appetite continues to increase.

## 2017-11-26 NOTE — Social Work (Signed)
Clinical Social Worker facilitated patient discharge including contacting patient family and facility to confirm patient discharge plans.  Clinical information faxed to facility and family agreeable with plan.  CSW arranged ambulance transport via [facility] to [facility].   RN to call [phone] for 300 hall nurse with report prior to discharge.  Clinical Social Worker will sign off for now as social work intervention is no longer needed. Please consult [place] again if new need arises.  Alexander Mt, [place] Social Worker [phone]

## 2017-11-26 NOTE — Consult Note (Signed)
            [facility] CM Primary Care Navigator  11/26/2017  Elizabeth Drake June 21, 1941 [id number]   Went to see patient at the bedside to identify possible discharge needs but she was alreadydischarged per staff report. Per chart review, patient was found by husband sleeping in her chair and difficult to arouse. CT head showed acute infarcts, so she was transferred to [facility] for stroke work up and neurology evaluation.  Patient was discharged to skilled nursing facility (SNF- [facility]) for rehabilitationper therapy recommendation.  Primary care provider's office is listed as providing transition of care (TOC) follow-up.  Patient has a discharge instruction to follow-up with primary care provider in 1- 2 weeks post discharge and follow-up with neurology as outpatient.   For additional questions please contact:  Edwena Felty A. Aquarius Tremper, BSN, RN-BC [facility] PRIMARY CARE Navigator Cell: [phone]

## 2017-11-26 NOTE — Discharge Summary (Addendum)
Physician Discharge Summary  Elizabeth Drake CZY:[id number] DOB: 11-25-1940 DOA: 11/23/2017  PCP: Chevis Pretty, FNP  Admit date: 11/23/2017 Discharge date: 11/26/2017  Admitted From: Home by way of [facility] Disposition: SNF   Recommendations for Outpatient Follow-up:  1. Follow up with PCP in 1-2 weeks 2. Restart xarelto 4/28, continue aspirin  3. Follow up with neurology as an outpatient  Home Health: N/A Equipment/Devices: Per SNF Discharge Condition: Stable CODE STATUS: DNR Diet recommendation: Honey thick liquids, Dys 2 texture, crush pills and full assist and supervision   Brief/Interim Summary: Elizabeth Drake is a 77 y.o. female with a history of HTN, T2DM, PAF, chronic pain on opioids, PE on xarelto, and sarcoidosis on prednisone chronically who presented to [facility] ED poorly responsive. Her husband and son provided the history. She had reportedly been in her usual state earlier in the day, was not complaining of anything this morning, and seemed normal when her husband went outside for approximately 1 hour. Upon returning, patient was sleeping in her chair and difficult to arouse. EMS was called, 1 mg of IV Narcan was given, patient woke up some but without any verbal response. There has not been any recent fall or trauma reported and the patient is not known to use alcohol. She has been very resistant to help from family with her medications and is not thought to have been compliant with xarelto, she had also filled Norco 10 mg on 11/21/2017 and had 12 pills missing.  Upon arrival to the ED, patient is found to be afebrile, saturating 80s on room air. CXR showed cardiomegaly with vascular congestion and lingular/LLL infiltrate (chronic) with effusion. UDS +opioids only, negative ethanol, etc. Lactate mildly elevated and WBC 11.7k. Initial concern was for opioid overdose, though CT head showed acute infarcts in right SCA distribution on diffusion imaging, so she was  transferred to [facility] for stroke work up/neurology evaluation. Her deficits have not resolved and she is maintained on a dysphagia diet. Per therapy recommendations, she is discharge to SNF for rehabilitation. Please see below for details concerning work up.   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 Tandem segments of severe stenosis of right PCA, right SCA occlusion seen on CTA H&N.  - Defer to neurology ?needs TEE  - ASA per neurology. NSAID allergy noted, has tolerated aspirin. - Restart anticoagulation (suspect noncompliance with xarelto) 4/28 (7 days post stroke) to prevent hemorrhagic conversion. - LDL is 95, started statin - HbA1c is 6%, continue DM management - PT/OT: Recommending SNF   Dysphagia:  - SLP recommendations following MBSS 4/22: honey thick liquids, Dys 2 texture, crush pills and full assist and supervision   Left lingula and base infiltrate: Chronic. Unable to accurately elicit symptoms from patient. Has mild leukocytosis but is on chronic prednisone. No fever and PCT undetectable not consistent with active infection.  MRSA negative. Strep and legionella antigens negative.  - No further treatment is indicated.   E. coli UTI: >100k E. coli with some  resistance on urine culture. Unable to elicit sxs.  - Had not received effective therapy (resistant to FQ) initially, so started ceftriaxone, would recommend continuing macrobid x7 total days (5 more days) based on susceptibility  data and allergy to keflex.  - Monitor blood cultures  Acute hypoxic respiratory failure: Resolved. Possibly due to obtundation. Some vascular congestion on CXR though no crackles on exam. Initially treated for PNA as above, though PCT is undetectable so abx stopped.  Hypokalemia:  - Replaced. Recommend recheck in the next week.  History of PE:  - Continue xarelto on 4/28.  Paroxysmal AFib: NSR while admitted  Sarcoidosis: Hx of lung and neuro involvement.  - Continue chronic prednisone, no indication for stress dosing.   T2DM: HbA1c 6% - Restart home medications at discharge.   HTN:  - Ok to restart metoprolol in the next day following a period of permissive HTN.  Chronic pain:  - Narcotics had been held for concern of overdose (had brief improvement with narcan) in addition to CVA. The patient has not complained of any pain, despite communicating other needs/desires clearly. Will continue these medications on an as needed basis.   Discharge Instructions Discharge Instructions    Ambulatory referral to Neurology   Complete by:  As directed    Follow up with stroke clinic NP (Jessica Vanschaick or Cecille Rubin, if both not available, consider Zachery Dauer, or Ahern) at [facility] in about 4 weeks. Thanks.   Diet - low sodium heart healthy   Complete by:  As directed    Increase activity slowly   Complete by:  As directed      Allergies as of 11/26/2017      Reactions   Advil [ibuprofen] Shortness Of Breath, Swelling   Propoxyphene Swelling   Azo [phenazopyridine] Other (See Comments)   Unknown reaction   Indocin [indomethacin] Swelling, Other (See Comments)   Unknown reaction   Keflex [cephalexin] Other (See Comments)   Loratadine Other (See Comments)   unknown   Percocet [oxycodone-acetaminophen] Itching   10/12/15 Patient denies allergy to APAP      Medication List    TAKE these medications   acetaminophen 500 MG tablet Commonly known as:  TYLENOL Take 2  tablets (1,000 mg total) by mouth every 8 (eight) hours.   aspirin 325 MG EC tablet Take 1 tablet (325 mg total) by mouth daily. Start taking on:  11/27/2017   diphenhydrAMINE 25 MG tablet Commonly known as:  BENADRYL Take 25 mg by mouth every 6 (six) hours as needed for itching.   feeding supplement Liqd Take 1 Container by mouth 2 (two) times daily between meals.   glimepiride 2 MG tablet Commonly known as:  AMARYL Take 1 tablet (2 mg total) by mouth daily.   HYDROcodone-acetaminophen 10-325 MG tablet Commonly known as:  NORCO Take 1 tablet by mouth 2 (two) times daily as needed (pain). What changed:  reasons to take this   loperamide 2 MG capsule Commonly known as:  IMODIUM Take 2 mg by mouth as needed for diarrhea or loose stools.   meclizine 25 MG tablet Commonly known as:  ANTIVERT Take 1 tablet (25 mg total) by mouth 3 (three) times daily as needed for dizziness.   metFORMIN 500 MG tablet Commonly known as:  GLUCOPHAGE Take 1 tablet (500 mg total) by mouth daily.   metoprolol tartrate 25 MG tablet Commonly known as:  LOPRESSOR Take 1 tablet (25 mg total) by mouth 2 (two) times daily.   mometasone  50 MCG/ACT nasal spray Commonly known as:  NASONEX 2 sprays per nostril qd   nitrofurantoin (macrocrystal-monohydrate) 100 MG capsule Commonly known as:  MACROBID Take 1 capsule (100 mg total) by mouth 2 (two) times daily.   polyethylene glycol packet Commonly known as:  MIRALAX / GLYCOLAX Take 17 g by mouth daily as needed.   predniSONE 5 MG tablet Commonly known as:  DELTASONE TAKE 1 TABLET BY MOUTH EVERY DAY WITH BREAKFAST   rivaroxaban 20 MG Tabs tablet Commonly known as:  XARELTO TAKE ONE TABLET BY MOUTH ONE TIME DAILY WITH SUPPER Start taking on:  12/01/2017 What changed:  These instructions start on 12/01/2017. If you are unsure what to do until then, ask your doctor or other care provider.   senna 8.6 MG Tabs tablet Commonly known as:  SENOKOT Take 2  tablets (17.2 mg total) by mouth at bedtime.       Contact information for follow-up providers    [facility]. Schedule an appointment as soon as possible for a visit in 4 week(s).   Specialty:  Radiology Contact information: [address] [place] [phone]       Chevis Pretty, [place] Follow up.   Specialty:  Family Medicine Contact information: [place] [address] [phone]            Contact information for after-discharge care    Destination    [facility] .   Service:  Skilled Nursing Contact information: [place] [phone]                 Allergies  Allergen Reactions  . Advil [Ibuprofen] Shortness Of Breath and Swelling  . Propoxyphene Swelling  . Azo [Phenazopyridine] Other (See Comments)    Unknown reaction  . Indocin [Indomethacin] Swelling and Other (See Comments)    Unknown reaction  . Keflex [Cephalexin] Other (See Comments)  . Loratadine Other (See Comments)    unknown  . Percocet [Oxycodone-Acetaminophen] Itching    10/12/15 Patient denies allergy to APAP    Consultations:  Stroke neurologist, Dr. Erlinda Hong  Procedures/Studies: Ct Angio Head W/cm &/or Wo Cm  Result Date: 11/23/2017 CLINICAL DATA:  77 y/o  F; altered mental status of unclear cause. EXAM: CT ANGIOGRAPHY HEAD AND NECK CT PERFUSION BRAIN TECHNIQUE: Multidetector CT imaging of the head and neck was performed using the standard protocol during bolus administration of intravenous contrast. Multiplanar CT image reconstructions and MIPs were obtained to evaluate the vascular anatomy. Carotid stenosis measurements (when applicable) are obtained utilizing NASCET criteria, using the distal internal carotid diameter as the denominator. Multiphase CT imaging of the brain was performed following IV bolus contrast injection. Subsequent parametric  perfusion maps were calculated using RAPID software. CONTRAST:  165mL ISOVUE-370 IOPAMIDOL (ISOVUE-370) INJECTION 76% COMPARISON:  11/23/2017 CT head.  12/09/2004 MRI head. FINDINGS: CT HEAD FINDINGS Brain: Region of hypoattenuation within the right superior cerebellar hemisphere and extending into the right hemi pons compatible with late acute to subacute infarction (series 5, image 56 and series 3, image 12). No associated hemorrhage or mass effect. No additional region of acute infarction, hemorrhage, or mass effect in the brain identified. Stable background of chronic microvascular ischemic changes and parenchymal volume loss of the supratentorial brain. Several small chronic lacunar infarcts within basal ganglia. Vascular: As below. Skull: Normal. Negative for fracture or focal lesion. Sinuses/Orbits: No acute finding. Other: None. ASPECTS [facility] Stroke Program  Early CT Score) - Ganglionic level infarction (caudate, lentiform nuclei, internal capsule, insula, M1-M3 cortex): 7 - Supraganglionic infarction (M4-M6 cortex): 3 Total score (0-10 with 10 being normal): 10 Review of the MIP images confirms the above findings CTA NECK FINDINGS Aortic arch: Standard branching. Imaged portion shows no evidence of aneurysm or dissection. No significant stenosis of the major arch vessel origins. Mild calcific atherosclerosis. Right carotid system: No evidence of dissection, stenosis (50% or greater) or occlusion. Mild non stenotic calcific atherosclerosis of the carotid bifurcation. Left carotid system: No evidence of dissection, stenosis (50% or greater) or occlusion. Mild non stenotic calcific atherosclerosis of the carotid bifurcation. Vertebral arteries: Left dominant. No evidence of dissection, stenosis (50% or greater) or occlusion. Skeleton: C6-T1 severe erosive discogenic degenerative changes with endplate eburnation and grade 1 anterolisthesis at the C7-T1 level with there is severe facet arthritis. Bones are  demineralized. Other neck: Multiple thyroid nodules measuring up to 9 mm on the left. Upper chest: Negative. Review of the MIP images confirms the above findings CTA HEAD FINDINGS Anterior circulation: No significant stenosis, proximal occlusion, aneurysm, or vascular malformation. The right ICA is larger than the left ICA likely due to variant anatomy where bilateral ACA are perfused by the right A1. Posterior circulation: Left P1 short segment of mild stenosis and right P1/P2 tandem segments of severe stenosis. Irregularity and mild stenosis of the left SCA origin. The right SCA attenuates in caliber early and appears occluded (series 12, image 120 and series 15, image 26). Venous sinuses: As permitted by contrast timing, patent. Anatomic variants: Anterior communicating artery and right posterior communicating artery. No left posterior communicating artery identified, likely hypoplastic or absent. Review of the MIP images confirms the above findings. CT Brain Perfusion Findings: CBF (<30%) Volume: 19mL Perfusion (Tmax>6.0s) volume: 10mL Mismatch Volume: 66mL Infarction Location:Right hemi pons and superior cerebellar hemisphere infarction. The area of infarction on noncontrast CT of head is larger than CBF (<30%) Volume indicating pseudonormalization of a region of the core infarction. IMPRESSION: CT head: 1. 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 No hemorrhage or associated mass effect. 2. Stable supratentorial chronic microvascular ischemic changes and parenchymal volume loss of the brain. CTA neck: 1. Patent carotid and vertebral arteries. No dissection, aneurysm, or hemodynamically significant stenosis utilizing NASCET criteria. 2. Severe cervical spondylosis at the C6-T1 levels. CTA head: 1. Right SCA occlusion. 2. Tandem segments of severe stenosis of right PCA. 3. Otherwise no large vessel occlusion, aneurysm, or significant stenosis is identified. CT brain  perfusion: Perfusion anomaly in the right SCA distribution. The area of infarction on noncontrast CT of head is larger than CBF (<30%) Volume indicating pseudonormalization of a region of the core infarction. These results were called by telephone at the time of interpretation on 11/23/2017 at 9:03 pm to Dr. Francine Graven , who verbally acknowledged these results. Electronically Signed   By: Kristine Garbe M.D.   On: 11/23/2017 21:07   Dg Chest 1 View  Result Date: 11/23/2017 CLINICAL DATA:  Altered mental status EXAM: CHEST  1 VIEW COMPARISON:  09/13/2014, 08/13/2014 FINDINGS: Mildly low lung volume. Small left pleural effusion with dense airspace disease at the lingula and left base. Cardiomegaly with aortic atherosclerosis. Minimal vascular congestion. No pneumothorax. IMPRESSION: 1. Mild left pleural effusion or thickening. Airspace disease at the lingula and left base, some of this may be chronic but difficult to exclude an acute infiltrate in the region 2. Cardiomegaly with mild vascular congestion Electronically  Signed   By: Donavan Foil M.D.   On: 11/23/2017 18:29   Ct Head Wo Contrast  Result Date: 11/23/2017 CLINICAL DATA:  Lethargy following hydrocodone administration EXAM: CT HEAD WITHOUT CONTRAST CT CERVICAL SPINE WITHOUT CONTRAST TECHNIQUE: Multidetector CT imaging of the head and cervical spine was performed following the standard protocol without intravenous contrast. Multiplanar CT image reconstructions of the cervical spine were also generated. COMPARISON:  11/15/2007 FINDINGS: CT HEAD FINDINGS Brain: No evidence of acute infarction, hemorrhage, hydrocephalus, extra-axial collection or mass lesion/mass effect. Chronic atrophic and ischemic changes are noted. Vascular: No hyperdense vessel or unexpected calcification. Skull: Normal. Negative for fracture or focal lesion. Sinuses/Orbits: No acute finding. Other: None. CT CERVICAL SPINE FINDINGS Alignment: Alignment is within  normal limits with the exception of degenerative anterolisthesis of C7 on T1. Skull base and vertebrae: 7 cervical segments are noted. Disc space narrowing is noted at C5-6, C6-7 and C7-T1. Significant sclerosis is noted at the C7-T1 interspace. Degenerative anterolisthesis is seen at C7-T1. Associated central canal stenosis is noted at this level. No acute fracture or acute facet abnormality is noted. Facet hypertrophic changes are noted at multiple levels. Soft tissues and spinal canal: No prevertebral fluid or swelling. No visible canal hematoma. Upper chest: Within normal limits. Other: None IMPRESSION: CT of the head: Chronic atrophic and ischemic changes without acute abnormality. CT of the cervical spine: Multilevel degenerative change without acute abnormality Electronically Signed   By: Inez Catalina M.D.   On: 11/23/2017 18:33   Ct Angio Neck W And/or Wo Contrast  Result Date: 11/23/2017 CLINICAL DATA:  77 y/o  F; altered mental status of unclear cause. EXAM: CT ANGIOGRAPHY HEAD AND NECK CT PERFUSION BRAIN TECHNIQUE: Multidetector CT imaging of the head and neck was performed using the standard protocol during bolus administration of intravenous contrast. Multiplanar CT image reconstructions and MIPs were obtained to evaluate the vascular anatomy. Carotid stenosis measurements (when applicable) are obtained utilizing NASCET criteria, using the distal internal carotid diameter as the denominator. Multiphase CT imaging of the brain was performed following IV bolus contrast injection. Subsequent parametric perfusion maps were calculated using RAPID software. CONTRAST:  186mL ISOVUE-370 IOPAMIDOL (ISOVUE-370) INJECTION 76% COMPARISON:  11/23/2017 CT head.  12/09/2004 MRI head. FINDINGS: CT HEAD FINDINGS Brain: Region of hypoattenuation within the right superior cerebellar hemisphere and extending into the right hemi pons compatible with late acute to subacute infarction (series 5, image 38 and series 3,  image 12). No associated hemorrhage or mass effect. No additional region of acute infarction, hemorrhage, or mass effect in the brain identified. Stable background of chronic microvascular ischemic changes and parenchymal volume loss of the supratentorial brain. Several small chronic lacunar infarcts within basal ganglia. Vascular: As below. Skull: Normal. Negative for fracture or focal lesion. Sinuses/Orbits: No acute finding. Other: None. ASPECTS ([facility] Stroke Program Early CT Score) - Ganglionic level infarction (caudate, lentiform nuclei, internal capsule, insula, M1-M3 cortex): 7 - Supraganglionic infarction (M4-M6 cortex): 3 Total score (0-10 with 10 being normal): 10 Review of the MIP images confirms the above findings CTA NECK FINDINGS Aortic arch: Standard branching. Imaged portion shows no evidence of aneurysm or dissection. No significant stenosis of the major arch vessel origins. Mild calcific atherosclerosis. Right carotid system: No evidence of dissection, stenosis (50% or greater) or occlusion. Mild non stenotic calcific atherosclerosis of the carotid bifurcation. Left carotid system: No evidence of dissection, stenosis (50% or greater) or occlusion. Mild non stenotic calcific atherosclerosis of the carotid bifurcation. Vertebral arteries:  Left dominant. No evidence of dissection, stenosis (50% or greater) or occlusion. Skeleton: C6-T1 severe erosive discogenic degenerative changes with endplate eburnation and grade 1 anterolisthesis at the C7-T1 level with there is severe facet arthritis. Bones are demineralized. Other neck: Multiple thyroid nodules measuring up to 9 mm on the left. Upper chest: Negative. Review of the MIP images confirms the above findings CTA HEAD FINDINGS Anterior circulation: No significant stenosis, proximal occlusion, aneurysm, or vascular malformation. The right ICA is larger than the left ICA likely due to variant anatomy where bilateral ACA are perfused by the right A1.  Posterior circulation: Left P1 short segment of mild stenosis and right P1/P2 tandem segments of severe stenosis. Irregularity and mild stenosis of the left SCA origin. The right SCA attenuates in caliber early and appears occluded (series 12, image 120 and series 15, image 26). Venous sinuses: As permitted by contrast timing, patent. Anatomic variants: Anterior communicating artery and right posterior communicating artery. No left posterior communicating artery identified, likely hypoplastic or absent. Review of the MIP images confirms the above findings. CT Brain Perfusion Findings: CBF (<30%) Volume: 42mL Perfusion (Tmax>6.0s) volume: 44mL Mismatch Volume: 63mL Infarction Location:Right hemi pons and superior cerebellar hemisphere infarction. The area of infarction on noncontrast CT of head is larger than CBF (<30%) Volume indicating pseudonormalization of a region of the core infarction. IMPRESSION: CT head: 1. 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 No hemorrhage or associated mass effect. 2. Stable supratentorial chronic microvascular ischemic changes and parenchymal volume loss of the brain. CTA neck: 1. Patent carotid and vertebral arteries. No dissection, aneurysm, or hemodynamically significant stenosis utilizing NASCET criteria. 2. Severe cervical spondylosis at the C6-T1 levels. CTA head: 1. Right SCA occlusion. 2. Tandem segments of severe stenosis of right PCA. 3. Otherwise no large vessel occlusion, aneurysm, or significant stenosis is identified. CT brain perfusion: Perfusion anomaly in the right SCA distribution. The area of infarction on noncontrast CT of head is larger than CBF (<30%) Volume indicating pseudonormalization of a region of the core infarction. These results were called by telephone at the time of interpretation on 11/23/2017 at 9:03 pm to Dr. Francine Graven , who verbally acknowledged these results. Electronically Signed   By: Kristine Garbe M.D.   On: 11/23/2017 21:07   Ct Cervical Spine Wo Contrast  Result Date: 11/23/2017 CLINICAL DATA:  Lethargy following hydrocodone administration EXAM: CT HEAD WITHOUT CONTRAST CT CERVICAL SPINE WITHOUT CONTRAST TECHNIQUE: Multidetector CT imaging of the head and cervical spine was performed following the standard protocol without intravenous contrast. Multiplanar CT image reconstructions of the cervical spine were also generated. COMPARISON:  11/15/2007 FINDINGS: CT HEAD FINDINGS Brain: No evidence of acute infarction, hemorrhage, hydrocephalus, extra-axial collection or mass lesion/mass effect. Chronic atrophic and ischemic changes are noted. Vascular: No hyperdense vessel or unexpected calcification. Skull: Normal. Negative for fracture or focal lesion. Sinuses/Orbits: No acute finding. Other: None. CT CERVICAL SPINE FINDINGS Alignment: Alignment is within normal limits with the exception of degenerative anterolisthesis of C7 on T1. Skull base and vertebrae: 7 cervical segments are noted. Disc space narrowing is noted at C5-6, C6-7 and C7-T1. Significant sclerosis is noted at the C7-T1 interspace. Degenerative anterolisthesis is seen at C7-T1. Associated central canal stenosis is noted at this level. No acute fracture or acute facet abnormality is noted. Facet hypertrophic changes are noted at multiple levels. Soft tissues and spinal canal: No prevertebral fluid or swelling. No visible canal hematoma. Upper chest: Within normal limits.  Other: None IMPRESSION: CT of the head: Chronic atrophic and ischemic changes without acute abnormality. CT of the cervical spine: Multilevel degenerative change without acute abnormality Electronically Signed   By: Inez Catalina M.D.   On: 11/23/2017 18:33   Mr Brain Wo Contrast  Result Date: 11/24/2017 CLINICAL DATA:  Altered mental status EXAM: MRI HEAD WITHOUT CONTRAST TECHNIQUE: Multiplanar, multiecho pulse sequences of the brain and surrounding  structures were obtained without intravenous contrast. COMPARISON:  Head CT 11/23/2017 FINDINGS: BRAIN: The midline structures are normal. There is abnormal diffusion restriction within the dorsolateral right pons and superior right cerebellum, within the right superior cerebellar artery territory, as demonstrated on the earlier CT. No mass lesion, hydrocephalus, dural abnormality or extra-axial collection. Early confluent hyperintense T2-weighted signal of the periventricular and deep white matter, most commonly due to chronic ischemic microangiopathy. No age-advanced or lobar predominant atrophy. There are a few central chronic microhemorrhages. VASCULAR: Major intracranial arterial and venous sinus flow voids are preserved. SKULL AND UPPER CERVICAL SPINE: The visualized skull base, calvarium, upper cervical spine and extracranial soft tissues are normal. SINUSES/ORBITS: No fluid levels or advanced mucosal thickening. No mastoid or middle ear effusion. Normal orbits. IMPRESSION: 1. 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 2. No hemorrhage or mass effect. 3. Chronic ischemic microangiopathy. Electronically Signed   By: Ulyses Jarred M.D.   On: 11/24/2017 06:03   Ct Cerebral Perfusion W Contrast  Result Date: 11/23/2017 CLINICAL DATA:  77 y/o  F; altered mental status of unclear cause. EXAM: CT ANGIOGRAPHY HEAD AND NECK CT PERFUSION BRAIN TECHNIQUE: Multidetector CT imaging of the head and neck was performed using the standard protocol during bolus administration of intravenous contrast. Multiplanar CT image reconstructions and MIPs were obtained to evaluate the vascular anatomy. Carotid stenosis measurements (when applicable) are obtained utilizing NASCET criteria, using the distal internal carotid diameter as the denominator. Multiphase CT imaging of the brain was performed following IV bolus contrast injection. Subsequent  parametric perfusion maps were calculated using RAPID software. CONTRAST:  125mL ISOVUE-370 IOPAMIDOL (ISOVUE-370) INJECTION 76% COMPARISON:  11/23/2017 CT head.  12/09/2004 MRI head. FINDINGS: CT HEAD FINDINGS Brain: Region of hypoattenuation within the right superior cerebellar hemisphere and extending into the right hemi pons compatible with late acute to subacute infarction (series 5, image 17 and series 3, image 12). No associated hemorrhage or mass effect. No additional region of acute infarction, hemorrhage, or mass effect in the brain identified. Stable background of chronic microvascular ischemic changes and parenchymal volume loss of the supratentorial brain. Several small chronic lacunar infarcts within basal ganglia. Vascular: As below. Skull: Normal. Negative for fracture or focal lesion. Sinuses/Orbits: No acute finding. Other: None. ASPECTS (Heppner Stroke Program Early CT Score) - Ganglionic level infarction (caudate, lentiform nuclei, internal capsule, insula, M1-M3 cortex): 7 - Supraganglionic infarction (M4-M6 cortex): 3 Total score (0-10 with 10 being normal): 10 Review of the MIP images confirms the above findings CTA NECK FINDINGS Aortic arch: Standard branching. Imaged portion shows no evidence of aneurysm or dissection. No significant stenosis of the major arch vessel origins. Mild calcific atherosclerosis. Right carotid system: No evidence of dissection, stenosis (50% or greater) or occlusion. Mild non stenotic calcific atherosclerosis of the carotid bifurcation. Left carotid system: No evidence of dissection, stenosis (50% or greater) or occlusion. Mild non stenotic calcific atherosclerosis of the carotid bifurcation. Vertebral arteries: Left dominant. No evidence of dissection, stenosis (50% or greater) or occlusion.  Skeleton: C6-T1 severe erosive discogenic degenerative changes with endplate eburnation and grade 1 anterolisthesis at the C7-T1 level with there is severe facet arthritis.  Bones are demineralized. Other neck: Multiple thyroid nodules measuring up to 9 mm on the left. Upper chest: Negative. Review of the MIP images confirms the above findings CTA HEAD FINDINGS Anterior circulation: No significant stenosis, proximal occlusion, aneurysm, or vascular malformation. The right ICA is larger than the left ICA likely due to variant anatomy where bilateral ACA are perfused by the right A1. Posterior circulation: Left P1 short segment of mild stenosis and right P1/P2 tandem segments of severe stenosis. Irregularity and mild stenosis of the left SCA origin. The right SCA attenuates in caliber early and appears occluded (series 12, image 120 and series 15, image 26). Venous sinuses: As permitted by contrast timing, patent. Anatomic variants: Anterior communicating artery and right posterior communicating artery. No left posterior communicating artery identified, likely hypoplastic or absent. Review of the MIP images confirms the above findings. CT Brain Perfusion Findings: CBF (<30%) Volume: 36mL Perfusion (Tmax>6.0s) volume: 51mL Mismatch Volume: 65mL Infarction Location:Right hemi pons and superior cerebellar hemisphere infarction. The area of infarction on noncontrast CT of head is larger than CBF (<30%) Volume indicating pseudonormalization of a region of the core infarction. IMPRESSION: CT head: 1. 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 No hemorrhage or associated mass effect. 2. Stable supratentorial chronic microvascular ischemic changes and parenchymal volume loss of the brain. CTA neck: 1. Patent carotid and vertebral arteries. No dissection, aneurysm, or hemodynamically significant stenosis utilizing NASCET criteria. 2. Severe cervical spondylosis at the C6-T1 levels. CTA head: 1. Right SCA occlusion. 2. Tandem segments of severe stenosis of right PCA. 3. Otherwise no large vessel occlusion, aneurysm, or significant stenosis is identified. CT  brain perfusion: Perfusion anomaly in the right SCA distribution. The area of infarction on noncontrast CT of head is larger than CBF (<30%) Volume indicating pseudonormalization of a region of the core infarction. These results were called by telephone at the time of interpretation on 11/23/2017 at 9:03 pm to Dr. Francine Graven , who verbally acknowledged these results. Electronically Signed   By: Kristine Garbe M.D.   On: 11/23/2017 21:07   Dg Swallowing Func-speech Pathology  Result Date: 11/25/2017 Objective Swallowing Evaluation: Type of Study: MBS-Modified Barium Swallow Study  Patient Details Name: Elizabeth Drake MRN: [id number] Date of Birth: 07/24/1941 Today's Date: 11/25/2017 Time: SLP Start Time (ACUTE ONLY): 1046 -SLP Stop Time (ACUTE ONLY): 1059 SLP Time Calculation (min) (ACUTE ONLY): 13 min Past Medical History: Past Medical History: Diagnosis Date . Arthritis  . Atrial fibrillation ([place])  . Atrial fibrillation with RVR ([place]) 09/13/2014 . Chronic pain  . Diabetes mellitus without complication ([place])  . DVT (deep venous thrombosis) ([place])  . Hiatal hernia  . Hypertension  . Osteoporosis  . Pulmonary emboli ([place])  . Sarcoidosis  . Vertigo  . Wheelchair bound  Past Surgical History: Past Surgical History: Procedure Laterality Date . ABDOMINAL HYSTERECTOMY   . I&D EXTREMITY Left 09/13/2014  Procedure: IRRIGATION AND DEBRIDEMENT EXTREMITY;  Surgeon: Linna Hoff, MD;  Location: [facility];  Service: Orthopedics;  Laterality: Left; . INTRAMEDULLARY (IM) NAIL INTERTROCHANTERIC Left 06/27/2015  Procedure: INTRAMEDULLARY (IM) NAIL INTERTROCHANTRIC AFFIXIS;  Surgeon: Marybelle Killings, MD;  Location: [facility];  Service: Orthopedics;  Laterality: Left; . JOINT REPLACEMENT    toe . LUNG LOBECTOMY Left   Lower HPI: 77 year old female with medical history significant for type  2 diabetes mellitus, hypertension, chronic pain on opiate analgesia, pulmonary embolism, sarcoidosis initially presented to the ED poorly  responsive in setting of questionable opiod overdose, found to have neuro change. MRI acute infarct of the superior right cerebellum and right dorso lateral pons, in the superior cerebellar artery territory. CXR mild left pleural effusion or thickening. Airspace disease at the lingula and left base, some of this may be chronic but difficult to exclude an acute infiltrate in the region, cardiomegaly with mild vascular congestion.  No data recorded Assessment / Plan / Recommendation CHL IP CLINICAL IMPRESSIONS 11/25/2017 Clinical Impression Moderate oral dysphagia marked by anterior spill, decreased control and delayed oral transit. Pharyngeal phase characterized by sensory impairments with late swallow initiation at the pyriform sinuses with thinner consistencies and valleculae with honey thick. Laryngeal penetration to vocal cords consistently with thin and nectar barium without sensation and ineffective attempts at cued throat clear/cough. No appreciable post swallow residue. Positioning somewhat challenging due to kyphosis. Recommend honey thick liquids, Dys 2 texture, crush pills and full assist and supervision with ST for safety and education.   SLP Visit Diagnosis Dysphagia, oropharyngeal phase (R13.12) Attention and concentration deficit following -- Frontal lobe and executive function deficit following -- Impact on safety and function Moderate aspiration risk   CHL IP TREATMENT RECOMMENDATION 11/25/2017 Treatment Recommendations Therapy as outlined in treatment plan below   Prognosis 11/25/2017 Prognosis for Safe Diet Advancement (No Data) Barriers to Reach Goals Cognitive deficits Barriers/Prognosis Comment -- CHL IP DIET RECOMMENDATION 11/25/2017 SLP Diet Recommendations Dysphagia 2 (Fine chop) solids;Honey thick liquids Liquid Administration via Cup Medication Administration Crushed with puree Compensations Slow rate;Small sips/bites;Lingual sweep for clearance of pocketing Postural Changes Seated upright at  90 degrees   CHL IP OTHER RECOMMENDATIONS 11/25/2017 Recommended Consults -- Oral Care Recommendations Oral care BID Other Recommendations --   CHL IP FOLLOW UP RECOMMENDATIONS 11/25/2017 Follow up Recommendations Other (comment)   CHL IP FREQUENCY AND DURATION 11/25/2017 Speech Therapy Frequency (ACUTE ONLY) min 2x/week Treatment Duration 2 weeks      CHL IP ORAL PHASE 11/25/2017 Oral Phase Impaired Oral - Pudding Teaspoon -- Oral - Pudding Cup -- Oral - Honey Teaspoon -- Oral - Honey Cup Decreased bolus cohesion;Delayed oral transit;Weak lingual manipulation;Left anterior bolus loss Oral - Nectar Teaspoon -- Oral - Nectar Cup Weak lingual manipulation;Delayed oral transit;Decreased bolus cohesion;Left anterior bolus loss Oral - Nectar Straw -- Oral - Thin Teaspoon -- Oral - Thin Cup -- Oral - Thin Straw WFL Oral - Puree Delayed oral transit Oral - Mech Soft -- Oral - Regular Weak lingual manipulation;Delayed oral transit Oral - Multi-Consistency -- Oral - Pill -- Oral Phase - Comment --  CHL IP PHARYNGEAL PHASE 11/25/2017 Pharyngeal Phase Impaired Pharyngeal- Pudding Teaspoon -- Pharyngeal -- Pharyngeal- Pudding Cup -- Pharyngeal -- Pharyngeal- Honey Teaspoon -- Pharyngeal -- Pharyngeal- Honey Cup -- Pharyngeal -- Pharyngeal- Nectar Teaspoon -- Pharyngeal -- Pharyngeal- Nectar Cup Delayed swallow initiation-pyriform sinuses;Penetration/Aspiration during swallow Pharyngeal Material enters airway, CONTACTS cords and not ejected out Pharyngeal- Nectar Straw -- Pharyngeal -- Pharyngeal- Thin Teaspoon -- Pharyngeal -- Pharyngeal- Thin Cup -- Pharyngeal -- Pharyngeal- Thin Straw Delayed swallow initiation-pyriform sinuses;Penetration/Aspiration during swallow Pharyngeal Material enters airway, CONTACTS cords and not ejected out Pharyngeal- Puree Penetration/Aspiration during swallow Pharyngeal Material enters airway, remains ABOVE vocal cords and not ejected out Pharyngeal- Mechanical Soft -- Pharyngeal -- Pharyngeal-  Regular Delayed swallow initiation-vallecula Pharyngeal -- Pharyngeal- Multi-consistency -- Pharyngeal -- Pharyngeal- Pill -- Pharyngeal -- Pharyngeal Comment --  CHL IP CERVICAL ESOPHAGEAL PHASE 11/25/2017 Cervical Esophageal Phase WFL Pudding Teaspoon -- Pudding Cup -- Honey Teaspoon -- Honey Cup -- Nectar Teaspoon -- Nectar Cup -- Nectar Straw -- Thin Teaspoon -- Thin Cup -- Thin Straw -- Puree -- Mechanical Soft -- Regular -- Multi-consistency -- Pill -- Cervical Esophageal Comment -- No flowsheet data found. [place] Siren 11/25/2017, 11:56 AM Orbie Pyo Colvin Caroli.Ed CCC-SLP Pager [phone]                Echocardiogram 11/25/2017: - Left ventricle: The cavity size was normal. Wall thickness was increased in a pattern of moderate LVH. Systolic function was normal. The estimated ejection fraction was in the range of 60% to 65%. Wall motion was normal; there were no regional wall motion abnormalities. Features are consistent with a pseudonormal left ventricular filling pattern, with concomitant abnormal relaxation and increased filling pressure (grade 2 diastolic dysfunction). - Aortic valve: There was mild regurgitation. - Mitral valve: There was mild regurgitation.  Subjective: No pain, ate with assistance without choking. No change in deficits. 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 Abdominal: Soft, NT, ND, bowel sounds + Neuro: Drowsy but rousable, Moves extremities but very ataxic and weak R > L. Possible right neglect, though visual fields grossly full to confrontation. +Aphasia.   Labs: BNP (last 3 results) No results  for input(s): BNP in the last 8760 hours. Basic Metabolic Panel: Recent Labs  Lab 11/23/17 1758 11/25/17 0344 11/26/17 0333  NA 139 136 135  K 3.6 3.1* 3.1*  CL 105 105 102  CO2 26 25 24   GLUCOSE 172* 82 104*  BUN 17 5* <5*  CREATININE 0.62 0.64 0.65  CALCIUM 8.7* 9.1 9.9   Liver Function Tests: Recent Labs  Lab 11/23/17 1758  AST 19  ALT 11*  ALKPHOS 69  BILITOT 0.4  PROT 8.4*  ALBUMIN 3.4*   No results for input(s): LIPASE, AMYLASE in the last 168 hours. No results for input(s): AMMONIA in the last 168 hours. CBC: Recent Labs  Lab 11/23/17 1758  WBC 11.7*  NEUTROABS 9.7*  HGB 11.5*  HCT 36.7  MCV 98.1  PLT 218   Cardiac Enzymes: Recent Labs  Lab 11/23/17 1758  TROPONINI <0.03   BNP: Invalid input(s): POCBNP CBG: Recent Labs  Lab 11/25/17 1952 11/26/17 0043 11/26/17 0107 11/26/17 0610 11/26/17 0732  GLUCAP 143* 108* 104* 104* 106*   D-Dimer No results for input(s): DDIMER in the last 72 hours. Hgb A1c Recent Labs    11/25/17 0344  HGBA1C 6.0*   Lipid Profile Recent Labs    11/25/17 0344  CHOL 155  HDL 33*  LDLCALC 95  TRIG 137  CHOLHDL 4.7   Thyroid function studies No results for input(s): TSH, T4TOTAL, T3FREE, THYROIDAB in the last 72 hours.  Invalid input(s): FREET3 Anemia work up No results for input(s): VITAMINB12, FOLATE, FERRITIN, TIBC, IRON, RETICCTPCT in the last 72 hours. Urinalysis    Component Value Date/Time   COLORURINE YELLOW 11/23/2017 1740   APPEARANCEUR HAZY (A) 11/23/2017 1740   LABSPEC 1.013 11/23/2017 1740   PHURINE  6.0 11/23/2017 1740   GLUCOSEU 50 (A) 11/23/2017 1740   HGBUR MODERATE (A) 11/23/2017 1740   BILIRUBINUR NEGATIVE 11/23/2017 1740   BILIRUBINUR neg 11/27/2014 1053   KETONESUR NEGATIVE 11/23/2017 1740   PROTEINUR NEGATIVE 11/23/2017 1740   UROBILINOGEN negative 11/27/2014 1053   UROBILINOGEN 1.0 09/13/2014 1845   NITRITE POSITIVE (A) 11/23/2017 1740   LEUKOCYTESUR NEGATIVE 11/23/2017  1740    Microbiology Recent Results (from the past 240 hour(s))  Urine culture     Status: Abnormal (Preliminary result)   Collection Time: 11/23/17  5:40 PM  Result Value Ref Range Status   Specimen Description   Final    URINE, CATHETERIZED Performed at [facility], [address] [place], [place] [address]    Special Requests   Final    NONE Performed at [facility], [address], [place], [place] [address]    Culture (A)  Final    >=100,000 COLONIES/mL ESCHERICHIA COLI CULTURE REINCUBATED FOR BETTER GROWTH Performed at [facility], [place] [address], [place], [place] [address]    Report Status PENDING  Incomplete   Organism ID, Bacteria ESCHERICHIA COLI (A)  Final      Susceptibility   Escherichia coli - MIC*    AMPICILLIN >=32 RESISTANT Resistant     CEFAZOLIN 16 SENSITIVE Sensitive     CEFTRIAXONE <=1 SENSITIVE Sensitive     CIPROFLOXACIN >=4 RESISTANT Resistant     GENTAMICIN <=1 SENSITIVE Sensitive     IMIPENEM <=0.25 SENSITIVE Sensitive     NITROFURANTOIN <=16 SENSITIVE Sensitive     TRIMETH/SULFA >=320 RESISTANT Resistant     AMPICILLIN/SULBACTAM >=32 RESISTANT Resistant     PIP/TAZO <=4 SENSITIVE Sensitive     Extended ESBL NEGATIVE Sensitive     * >=100,000 COLONIES/mL ESCHERICHIA COLI  Culture, blood (routine x 2)     Status: None (Preliminary result)   Collection Time: 11/23/17  7:10 PM  Result Value Ref Range Status   Specimen Description BLOOD LEFT WRIST  Final   Special Requests   Final    BOTTLES DRAWN AEROBIC ONLY Blood Culture results may not be optimal due to an inadequate volume of blood received in culture bottles DRAWN BY RN   Culture   Final    NO GROWTH 3 DAYS Performed at [facility], [address] [place], [place] [address]    Report Status PENDING  Incomplete  Culture, blood (routine x 2)     Status: None (Preliminary result)   Collection Time: 11/23/17  7:44 PM  Result Value Ref Range Status   Specimen Description BLOOD LEFT  ARM  Final   Special Requests   Final    BOTTLES DRAWN AEROBIC ONLY Blood Culture results may not be optimal due to an inadequate volume of blood received in culture bottles   Culture   Final    NO GROWTH 3 DAYS Performed at [facility], [address], [place], [place] [address]    Report Status PENDING  Incomplete  MRSA PCR Screening     Status: None   Collection Time: 11/24/17  2:54 PM  Result Value Ref Range Status   MRSA by PCR NEGATIVE NEGATIVE Final    Comment:        The GeneXpert MRSA Assay (FDA approved for NASAL specimens only), is one component of a comprehensive MRSA colonization surveillance program. It is not intended to diagnose MRSA infection nor to guide or monitor treatment for MRSA infections. Performed at [facility], [address] [place], [place] [address]     Time coordinating discharge: Approximately 40 minutes  Vance Gather, MD  [facility] 11/26/2017, 10:48 AM Pager [phone]

## 2017-11-27 LAB — URINE CULTURE: Culture: >=100000 — AB

## 2017-11-28 LAB — CULTURE, BLOOD (ROUTINE X 2)
CULTURE: NO GROWTH
CULTURE: NO GROWTH

## 2017-12-02 ENCOUNTER — Encounter (HOSPITAL_COMMUNITY): Payer: Self-pay

## 2017-12-02 ENCOUNTER — Other Ambulatory Visit: Payer: Self-pay

## 2017-12-02 ENCOUNTER — Inpatient Hospital Stay (HOSPITAL_COMMUNITY)
Admission: EM | Admit: 2017-12-02 | Discharge: 2017-12-06 | DRG: 871 | Disposition: A | Discharge status: Skilled Nursing Facility | Payer: Medicare Other | Attending: Internal Medicine | Admitting: Internal Medicine

## 2017-12-02 ENCOUNTER — Emergency Department (HOSPITAL_COMMUNITY): Payer: Medicare Other

## 2017-12-02 ENCOUNTER — Observation Stay (HOSPITAL_COMMUNITY): Payer: Medicare Other

## 2017-12-02 DIAGNOSIS — E870 Hyperosmolality and hypernatremia: Secondary | ICD-10-CM | POA: Diagnosis present

## 2017-12-02 DIAGNOSIS — I69321 Dysphasia following cerebral infarction: Secondary | ICD-10-CM

## 2017-12-02 DIAGNOSIS — G894 Chronic pain syndrome: Secondary | ICD-10-CM | POA: Diagnosis present

## 2017-12-02 DIAGNOSIS — D869 Sarcoidosis, unspecified: Secondary | ICD-10-CM | POA: Diagnosis present

## 2017-12-02 DIAGNOSIS — J189 Pneumonia, unspecified organism: Secondary | ICD-10-CM | POA: Diagnosis present

## 2017-12-02 DIAGNOSIS — R627 Adult failure to thrive: Secondary | ICD-10-CM | POA: Diagnosis present

## 2017-12-02 DIAGNOSIS — Z7952 Long term (current) use of systemic steroids: Secondary | ICD-10-CM

## 2017-12-02 DIAGNOSIS — R4182 Altered mental status, unspecified: Secondary | ICD-10-CM | POA: Diagnosis not present

## 2017-12-02 DIAGNOSIS — E43 Unspecified severe protein-calorie malnutrition: Secondary | ICD-10-CM | POA: Diagnosis present

## 2017-12-02 DIAGNOSIS — Z681 Body mass index (BMI) 19 or less, adult: Secondary | ICD-10-CM

## 2017-12-02 DIAGNOSIS — E860 Dehydration: Secondary | ICD-10-CM | POA: Diagnosis present

## 2017-12-02 DIAGNOSIS — I10 Essential (primary) hypertension: Secondary | ICD-10-CM | POA: Diagnosis present

## 2017-12-02 DIAGNOSIS — G9340 Encephalopathy, unspecified: Secondary | ICD-10-CM

## 2017-12-02 DIAGNOSIS — D638 Anemia in other chronic diseases classified elsewhere: Secondary | ICD-10-CM | POA: Diagnosis present

## 2017-12-02 DIAGNOSIS — Z7901 Long term (current) use of anticoagulants: Secondary | ICD-10-CM

## 2017-12-02 DIAGNOSIS — Z902 Acquired absence of lung [part of]: Secondary | ICD-10-CM

## 2017-12-02 DIAGNOSIS — R0602 Shortness of breath: Secondary | ICD-10-CM

## 2017-12-02 DIAGNOSIS — Z886 Allergy status to analgesic agent status: Secondary | ICD-10-CM

## 2017-12-02 DIAGNOSIS — E1165 Type 2 diabetes mellitus with hyperglycemia: Secondary | ICD-10-CM | POA: Diagnosis present

## 2017-12-02 DIAGNOSIS — L89151 Pressure ulcer of sacral region, stage 1: Secondary | ICD-10-CM | POA: Diagnosis present

## 2017-12-02 DIAGNOSIS — G9341 Metabolic encephalopathy: Secondary | ICD-10-CM | POA: Diagnosis not present

## 2017-12-02 DIAGNOSIS — Z7984 Long term (current) use of oral hypoglycemic drugs: Secondary | ICD-10-CM

## 2017-12-02 DIAGNOSIS — I69391 Dysphagia following cerebral infarction: Secondary | ICD-10-CM

## 2017-12-02 DIAGNOSIS — Z7982 Long term (current) use of aspirin: Secondary | ICD-10-CM

## 2017-12-02 DIAGNOSIS — I480 Paroxysmal atrial fibrillation: Secondary | ICD-10-CM | POA: Diagnosis present

## 2017-12-02 DIAGNOSIS — Z7189 Other specified counseling: Secondary | ICD-10-CM

## 2017-12-02 DIAGNOSIS — R918 Other nonspecific abnormal finding of lung field: Secondary | ICD-10-CM | POA: Diagnosis not present

## 2017-12-02 DIAGNOSIS — Z993 Dependence on wheelchair: Secondary | ICD-10-CM

## 2017-12-02 DIAGNOSIS — Z888 Allergy status to other drugs, medicaments and biological substances status: Secondary | ICD-10-CM

## 2017-12-02 DIAGNOSIS — Z515 Encounter for palliative care: Secondary | ICD-10-CM | POA: Diagnosis present

## 2017-12-02 DIAGNOSIS — Z86711 Personal history of pulmonary embolism: Secondary | ICD-10-CM

## 2017-12-02 DIAGNOSIS — A419 Sepsis, unspecified organism: Principal | ICD-10-CM | POA: Diagnosis present

## 2017-12-02 DIAGNOSIS — Z86718 Personal history of other venous thrombosis and embolism: Secondary | ICD-10-CM

## 2017-12-02 DIAGNOSIS — Z881 Allergy status to other antibiotic agents status: Secondary | ICD-10-CM

## 2017-12-02 DIAGNOSIS — E876 Hypokalemia: Secondary | ICD-10-CM | POA: Diagnosis present

## 2017-12-02 DIAGNOSIS — Z79899 Other long term (current) drug therapy: Secondary | ICD-10-CM

## 2017-12-02 DIAGNOSIS — Z66 Do not resuscitate: Secondary | ICD-10-CM | POA: Diagnosis present

## 2017-12-02 DIAGNOSIS — Z87891 Personal history of nicotine dependence: Secondary | ICD-10-CM

## 2017-12-02 DIAGNOSIS — R4020 Unspecified coma: Secondary | ICD-10-CM | POA: Diagnosis not present

## 2017-12-02 DIAGNOSIS — E119 Type 2 diabetes mellitus without complications: Secondary | ICD-10-CM

## 2017-12-02 DIAGNOSIS — M810 Age-related osteoporosis without current pathological fracture: Secondary | ICD-10-CM | POA: Diagnosis present

## 2017-12-02 DIAGNOSIS — Z885 Allergy status to narcotic agent status: Secondary | ICD-10-CM

## 2017-12-02 DIAGNOSIS — R1310 Dysphagia, unspecified: Secondary | ICD-10-CM | POA: Diagnosis present

## 2017-12-02 LAB — I-STAT CHEM 8, ED
BUN: 39 mg/dL — AB (ref 6–20)
CALCIUM ION: 1.17 mmol/L (ref 1.15–1.40)
CREATININE: 0.6 mg/dL (ref 0.44–1.00)
Chloride: 112 mmol/L — ABNORMAL HIGH (ref 101–111)
GLUCOSE: 180 mg/dL — AB (ref 65–99)
HCT: 35 % — ABNORMAL LOW (ref 36.0–46.0)
Hemoglobin: 11.9 g/dL — ABNORMAL LOW (ref 12.0–15.0)
Potassium: 3.7 mmol/L (ref 3.5–5.1)
Sodium: 149 mmol/L — ABNORMAL HIGH (ref 135–145)
TCO2: 27 mmol/L (ref 22–32)

## 2017-12-02 LAB — COMPREHENSIVE METABOLIC PANEL
ALBUMIN: 3.7 g/dL (ref 3.5–5.0)
ALK PHOS: 58 U/L (ref 38–126)
ALT: 11 U/L — ABNORMAL LOW (ref 14–54)
ANION GAP: 15 (ref 5–15)
AST: 17 U/L (ref 15–41)
BILIRUBIN TOTAL: 0.8 mg/dL (ref 0.3–1.2)
BUN: 50 mg/dL — AB (ref 6–20)
CO2: 22 mmol/L (ref 22–32)
Calcium: 10.5 mg/dL — ABNORMAL HIGH (ref 8.9–10.3)
Chloride: 111 mmol/L (ref 101–111)
Creatinine, Ser: 0.91 mg/dL (ref 0.44–1.00)
GFR calc Af Amer: >60 mL/min (ref >60)
GFR, EST NON AFRICAN AMERICAN: 59 mL/min — AB (ref >60)
GLUCOSE: 198 mg/dL — AB (ref 65–99)
POTASSIUM: 3.8 mmol/L (ref 3.5–5.1)
Sodium: 148 mmol/L — ABNORMAL HIGH (ref 135–145)
Total Protein: 10.5 g/dL — ABNORMAL HIGH (ref 6.5–8.1)

## 2017-12-02 LAB — CBC WITH DIFFERENTIAL/PLATELET
Basophils Absolute: 0 10*3/uL (ref 0.0–0.1)
Basophils Relative: 0 %
EOS ABS: 0 10*3/uL (ref 0.0–0.7)
EOS PCT: 0 %
HCT: 45.2 % (ref 36.0–46.0)
HEMOGLOBIN: 13.8 g/dL (ref 12.0–15.0)
LYMPHS ABS: 1.7 10*3/uL (ref 0.7–4.0)
Lymphocytes Relative: 15 %
MCH: 30.9 pg (ref 26.0–34.0)
MCHC: 30.5 g/dL (ref 30.0–36.0)
MCV: 101.3 fL — ABNORMAL HIGH (ref 78.0–100.0)
MONO ABS: 1 10*3/uL (ref 0.1–1.0)
MONOS PCT: 10 %
NEUTROS PCT: 75 %
Neutro Abs: 8.1 10*3/uL — ABNORMAL HIGH (ref 1.7–7.7)
Platelets: 206 10*3/uL (ref 150–400)
RBC: 4.46 MIL/uL (ref 3.87–5.11)
RDW: 13.6 % (ref 11.5–15.5)
WBC: 10.8 10*3/uL — ABNORMAL HIGH (ref 4.0–10.5)

## 2017-12-02 LAB — I-STAT CG4 LACTIC ACID, ED
Lactic Acid, Venous: 1.73 mmol/L (ref 0.5–1.9)
Lactic Acid, Venous: 1.64 mmol/L (ref 0.5–1.9)

## 2017-12-02 LAB — URINALYSIS, ROUTINE W REFLEX MICROSCOPIC
Bacteria, UA: NONE SEEN
Bilirubin Urine: NEGATIVE
GLUCOSE, UA: NEGATIVE mg/dL
KETONES UR: 5 mg/dL — AB
Leukocytes, UA: NEGATIVE
Nitrite: NEGATIVE
PH: 5 (ref 5.0–8.0)
Protein, ur: 100 mg/dL — AB
Specific Gravity, Urine: 1.025 (ref 1.005–1.030)

## 2017-12-02 LAB — CBG MONITORING, ED: GLUCOSE-CAPILLARY: 176 mg/dL — AB (ref 65–99)

## 2017-12-02 MED ORDER — ACETAMINOPHEN 325 MG PO TABS: 650.0000 mg | ORAL_TABLET | Freq: Four times a day (QID) | ORAL | Status: DC | PRN

## 2017-12-02 MED ORDER — INSULIN ASPART 100 UNIT/ML ~~LOC~~ SOLN
0.0000 [IU] | SUBCUTANEOUS | Status: DC
  Administered 2017-12-02: 2 [IU] via SUBCUTANEOUS
  Administered 2017-12-03 (×3): 1 [IU] via SUBCUTANEOUS
  Administered 2017-12-03: 2 [IU] via SUBCUTANEOUS
  Administered 2017-12-03 – 2017-12-04 (×2): 1 [IU] via SUBCUTANEOUS
  Administered 2017-12-04: 3 [IU] via SUBCUTANEOUS
  Administered 2017-12-05 – 2017-12-06 (×2): 2 [IU] via SUBCUTANEOUS
  Administered 2017-12-06 (×2): 1 [IU] via SUBCUTANEOUS
  Filled 2017-12-02: qty 1

## 2017-12-02 MED ORDER — PIPERACILLIN-TAZOBACTAM 3.375 G IVPB 30 MIN
3.3750 g | Freq: Once | INTRAVENOUS | Status: AC
  Administered 2017-12-02: 3.375 g via INTRAVENOUS
  Filled 2017-12-02: qty 50

## 2017-12-02 MED ORDER — FLUTICASONE PROPIONATE 50 MCG/ACT NA SUSP
2.0000 | Freq: Every day | NASAL | Status: DC
  Administered 2017-12-03 – 2017-12-06 (×4): 2 via NASAL
  Filled 2017-12-02: qty 16

## 2017-12-02 MED ORDER — VANCOMYCIN HCL IN DEXTROSE 1-5 GM/200ML-% IV SOLN
1000.0000 mg | Freq: Once | INTRAVENOUS | Status: AC
  Administered 2017-12-02: 1000 mg via INTRAVENOUS
  Filled 2017-12-02: qty 200

## 2017-12-02 MED ORDER — SODIUM CHLORIDE 0.45 % IV SOLN
INTRAVENOUS | Status: AC
  Administered 2017-12-02: 22:00:00 via INTRAVENOUS

## 2017-12-02 MED ORDER — METOPROLOL TARTRATE 5 MG/5ML IV SOLN
2.5000 mg | Freq: Four times a day (QID) | INTRAVENOUS | Status: DC
  Administered 2017-12-03 – 2017-12-06 (×14): 2.5 mg via INTRAVENOUS
  Filled 2017-12-02 (×15): qty 5

## 2017-12-02 MED ORDER — LACTATED RINGERS IV BOLUS
1000.0000 mL | Freq: Once | INTRAVENOUS | Status: AC
  Administered 2017-12-02: 1000 mL via INTRAVENOUS

## 2017-12-02 MED ORDER — PREDNISONE 10 MG PO TABS: 5.0000 mg | ORAL_TABLET | Freq: Every day | ORAL | Status: DC

## 2017-12-02 MED ORDER — METOPROLOL TARTRATE 25 MG PO TABS: 25.0000 mg | ORAL_TABLET | Freq: Two times a day (BID) | ORAL | Status: DC

## 2017-12-02 MED ORDER — ONDANSETRON HCL 4 MG PO TABS: 4.0000 mg | ORAL_TABLET | Freq: Four times a day (QID) | ORAL | Status: DC | PRN

## 2017-12-02 MED ORDER — VANCOMYCIN HCL IN DEXTROSE 750-5 MG/150ML-% IV SOLN
750.0000 mg | INTRAVENOUS | Status: DC
  Administered 2017-12-03 – 2017-12-04 (×2): 750 mg via INTRAVENOUS
  Filled 2017-12-02 (×2): qty 150

## 2017-12-02 MED ORDER — PIPERACILLIN-TAZOBACTAM 3.375 G IVPB: 3.3750 g | Freq: Three times a day (TID) | INTRAVENOUS | Status: DC

## 2017-12-02 MED ORDER — ACETAMINOPHEN 650 MG RE SUPP: 650.0000 mg | Freq: Four times a day (QID) | RECTAL | Status: DC | PRN

## 2017-12-02 MED ORDER — ONDANSETRON HCL 4 MG/2ML IJ SOLN: 4.0000 mg | Freq: Four times a day (QID) | INTRAMUSCULAR | Status: DC | PRN

## 2017-12-02 MED ORDER — PIPERACILLIN-TAZOBACTAM 3.375 G IVPB
3.3750 g | Freq: Three times a day (TID) | INTRAVENOUS | Status: DC
  Administered 2017-12-03 – 2017-12-04 (×6): 3.375 g via INTRAVENOUS
  Filled 2017-12-02 (×6): qty 50

## 2017-12-02 NOTE — ED Notes (Signed)
Pt is now a bit more responsive. Is opening her eyes and can shake head yes and no. Have performed mouth care on pt. Pt's mouth is bleeding in corners now from patient trying to answer questions and opening mouth wider. Pt states she is in pain all over.

## 2017-12-02 NOTE — ED Triage Notes (Signed)
Pt had a brainstem stroke 2 saturdays ago. Was in [place] and then transferred to [facility]. Per facility, patient would respond to verbal stimuli yesterday. Today patient is not responding at all. Is not alert or oriented. Per EMS. 12 Lead said stemi, called [facility] and consulted them, but Dr. Zenia Resides stated NO STEMI was seen. Pt was also given tylenol by facility, but no temperature per EMS.

## 2017-12-02 NOTE — ED Notes (Signed)
Patient transported to CT 

## 2017-12-02 NOTE — ED Notes (Signed)
[phone]  "Jimmy"

## 2017-12-02 NOTE — H&P (Addendum)
History and Physical    Elizabeth Drake KVQ:[id number] DOB: 1941-05-13 DOA: 12/02/2017  PCP: Chevis Pretty, FNP   Patient coming from: [facility]  Chief Complaint: Altered mental status  HPI: Elizabeth Drake is a 77 y.o. 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 coli UTI with recent discharge to skilled nursing facility on 4/23 who is brought to the ED from our facility due to altered mentation noted at the facility over the last 2 days.  She has supposedly also had worsening oral intake noted.  Additionally, she was noted to be febrile.  No further history can be obtained as patient is level 5 caveat and no family members are at bedside.   ED Course: Vital signs are stable and rectal temperature on arrival to ED was 101.4 Fahrenheit according to ED physician.  She has a leukocytosis of 10,800, hemoglobin 11.9, sodium 149, chloride 112, BUN 39, creatinine 0.60, glucose 180, initial lactic acid of 1.73 which is improved to 1.64 after 2 liters of lactated Ringer's.  Blood cultures have been collected and she has been started on vancomycin and Zosyn.  CT of the head with no new abnormalities noted.  Urine analysis with no findings of UTI currently and 1 view chest x-ray with what appears to be a chronic lingular opacity with no changes from previous. She is noted to be DNR.  Review of Systems: Cannot be obtained due to patient condition.  Past Medical History:  Diagnosis Date  . Arthritis   . Atrial fibrillation ([place])   . Atrial fibrillation with RVR ([place]) 09/13/2014  . Chronic pain   . Diabetes mellitus without complication ([place])   . DVT (deep venous thrombosis) ([place])   . Hiatal hernia   . Hypertension   . Osteoporosis   . Pulmonary emboli ([place])   .  Sarcoidosis   . Vertigo   . Wheelchair bound     Past Surgical History:  Procedure Laterality Date  . ABDOMINAL HYSTERECTOMY    . I&D EXTREMITY Left 09/13/2014   Procedure: IRRIGATION AND DEBRIDEMENT EXTREMITY;  Surgeon: Linna Hoff, MD;  Location: [place];  Service: Orthopedics;  Laterality: Left;  . INTRAMEDULLARY (IM) NAIL INTERTROCHANTERIC Left 06/27/2015   Procedure: INTRAMEDULLARY (IM) NAIL INTERTROCHANTRIC AFFIXIS;  Surgeon: Marybelle Killings, MD;  Location: [place];  Service: Orthopedics;  Laterality: Left;  . JOINT REPLACEMENT     toe  . LUNG LOBECTOMY Left    Lower     reports that she quit smoking about 16 years ago. Her smoking use included cigarettes. She has never used smokeless tobacco. She reports that she does not drink alcohol or use drugs.  Allergies  Allergen Reactions  . Advil [Ibuprofen] Shortness Of Breath and Swelling  . Propoxyphene Swelling  . Azo [Phenazopyridine] Other (See Comments)    Unknown reaction  . Indocin [Indomethacin] Swelling and Other (See Comments)    Unknown reaction  . Keflex [Cephalexin] Other (See Comments)  . Loratadine Other (See Comments)    unknown  . Percocet [Oxycodone-Acetaminophen] Itching    10/12/15 Patient denies allergy to APAP    Family History  Problem Relation Age of Onset  . Heart disease Mother   . Hypertension Mother   . Cancer Father        pancratic  . Cancer - Other  Sister     Prior to Admission medications   Medication Sig Start Date End Date Taking? Authorizing Provider  acetaminophen (TYLENOL) 500 MG tablet Take 2 tablets (1,000 mg total) by mouth every 8 (eight) hours. 06/29/15  Yes Ghimire, Henreitta Leber, MD  aspirin EC 325 MG EC tablet Take 1 tablet (325 mg total) by mouth daily. 11/27/17  Yes Patrecia Pour, MD  glimepiride (AMARYL) 2 MG tablet Take 1 tablet (2 mg total) by mouth daily. 07/10/17  Yes Hassell Done, Mary-Margaret, FNP  HYDROcodone-acetaminophen (NORCO) 10-325 MG tablet Take 1 tablet by mouth 2 (two)  times daily as needed (pain). 11/26/17  Yes Patrecia Pour, MD  metFORMIN (GLUCOPHAGE) 500 MG tablet Take 1 tablet (500 mg total) by mouth daily. 07/10/17  Yes Hassell Done, Mary-Margaret, FNP  metoprolol tartrate (LOPRESSOR) 25 MG tablet Take 1 tablet (25 mg total) by mouth 2 (two) times daily. 10/14/17  Yes Martin, Mary-Margaret, FNP  mometasone (NASONEX) 50 MCG/ACT nasal spray 2 sprays per nostril qd Patient taking differently: Place 2 sprays into the nose every morning. 2 sprays per nostril qd 12/21/13  Yes Oxford, Orson Ape, FNP  Multiple Vitamin (MULTIVITAMIN WITH MINERALS) TABS tablet Take 1 tablet by mouth daily.   Yes [provider]  polyethylene glycol (MIRALAX / GLYCOLAX) packet Take 17 g by mouth daily as needed. Patient taking differently: Take 17 g by mouth daily.  06/29/15  Yes Ghimire, Henreitta Leber, MD  predniSONE (DELTASONE) 5 MG tablet TAKE 1 TABLET BY MOUTH EVERY DAY WITH BREAKFAST 10/14/17  Yes Hassell Done, Mary-Margaret, FNP  rivaroxaban (XARELTO) 20 MG TABS tablet TAKE ONE TABLET BY MOUTH ONE TIME DAILY WITH SUPPER 12/01/17  Yes Patrecia Pour, MD  senna (SENOKOT) 8.6 MG TABS tablet Take 2 tablets (17.2 mg total) by mouth at bedtime. 06/29/15  Yes Ghimire, Henreitta Leber, MD  vitamin C (ASCORBIC ACID) 500 MG tablet Take 500 mg by mouth 2 (two) times daily.   Yes [provider]  zinc sulfate 220 (50 Zn) MG capsule Take 220 mg by mouth daily.   Yes [provider]  feeding supplement (BOOST / RESOURCE BREEZE) LIQD Take 1 Container by mouth 2 (two) times daily between meals. Patient not taking: Reported on 11/24/2017 06/29/15   Jonetta Osgood, MD  meclizine (ANTIVERT) 25 MG tablet Take 1 tablet (25 mg total) by mouth 3 (three) times daily as needed for dizziness. Patient not taking: Reported on 11/24/2017 01/01/17   Chevis Pretty, FNP  nitrofurantoin, macrocrystal-monohydrate, (MACROBID) 100 MG capsule Take 1 capsule (100 mg total) by mouth 2 (two) times daily. Patient  not taking: Reported on 12/02/2017 11/26/17   Patrecia Pour, MD    Physical Exam: Vitals:   12/02/17 1900 12/02/17 1930 12/02/17 2000 12/02/17 2030  BP: (!) 151/75 (!) 154/75 (!) 143/76 (!) 143/75  Pulse: 84 86 86 82  Resp: (!) 24 (!) 22 (!) 21 20  SpO2: 96% 96% 94% 96%  Weight:        Constitutional: NAD, alert/awake, but minimally responsive Vitals:   12/02/17 1900 12/02/17 1930 12/02/17 2000 12/02/17 2030  BP: (!) 151/75 (!) 154/75 (!) 143/76 (!) 143/75  Pulse: 84 86 86 82  Resp: (!) 24 (!) 22 (!) 21 20  SpO2: 96% 96% 94% 96%  Weight:       Eyes: lids and conjunctivae normal ENMT: Mucous membranes are dry; edentulous; dried blood on oral mucosa Neck: normal, supple Respiratory: clear to auscultation bilaterally. Normal respiratory effort. No accessory  muscle use. Currently on RA.  Cardiovascular: Regular rate and rhythm-tachycardic, no murmurs. No extremity edema. Abdomen: no tenderness, no distention. Bowel sounds positive.  Musculoskeletal:  No joint deformity upper and lower extremities.   Skin: no rashes, lesions, ulcers. Mild abrasions on LE.  Labs on Admission: I have personally reviewed following labs and imaging studies  CBC: Recent Labs  Lab 12/02/17 1519 12/02/17 2001  WBC 10.8*  --   NEUTROABS 8.1*  --   HGB 13.8 11.9*  HCT 45.2 35.0*  MCV 101.3*  --   PLT 206  --    Basic Metabolic Panel: Recent Labs  Lab 11/26/17 0333 12/02/17 1519 12/02/17 2001  NA 135 148* 149*  K 3.1* 3.8 3.7  CL 102 111 112*  CO2 24 22  --   GLUCOSE 104* 198* 180*  BUN <5* 50* 39*  CREATININE 0.65 0.91 0.60  CALCIUM 9.9 10.5*  --    GFR: Estimated Creatinine Clearance: 46 mL/min (by C-G formula based on SCr of 0.6 mg/dL). Liver Function Tests: Recent Labs  Lab 12/02/17 1519  AST 17  ALT 11*  ALKPHOS 58  BILITOT 0.8  PROT 10.5*  ALBUMIN 3.7   No results for input(s): LIPASE, AMYLASE in the last 168 hours. No results for input(s): AMMONIA in the last 168  hours. Coagulation Profile: No results for input(s): INR, PROTIME in the last 168 hours. Cardiac Enzymes: No results for input(s): CKTOTAL, CKMB, CKMBINDEX, TROPONINI in the last 168 hours. BNP (last 3 results) No results for input(s): PROBNP in the last 8760 hours. HbA1C: No results for input(s): HGBA1C in the last 72 hours. CBG: Recent Labs  Lab 11/26/17 0043 11/26/17 0107 11/26/17 0610 11/26/17 0732 11/26/17 1105  GLUCAP 108* 104* 104* 106* 138*   Lipid Profile: No results for input(s): CHOL, HDL, LDLCALC, TRIG, CHOLHDL, LDLDIRECT in the last 72 hours. Thyroid Function Tests: No results for input(s): TSH, T4TOTAL, FREET4, T3FREE, THYROIDAB in the last 72 hours. Anemia Panel: No results for input(s): VITAMINB12, FOLATE, FERRITIN, TIBC, IRON, RETICCTPCT in the last 72 hours. Urine analysis:    Component Value Date/Time   COLORURINE AMBER (A) 12/02/2017 1540   APPEARANCEUR HAZY (A) 12/02/2017 1540   LABSPEC 1.025 12/02/2017 1540   PHURINE 5.0 12/02/2017 1540   GLUCOSEU NEGATIVE 12/02/2017 1540   HGBUR LARGE (A) 12/02/2017 1540   BILIRUBINUR NEGATIVE 12/02/2017 1540   BILIRUBINUR neg 11/27/2014 1053   KETONESUR 5 (A) 12/02/2017 1540   PROTEINUR 100 (A) 12/02/2017 1540   UROBILINOGEN negative 11/27/2014 1053   UROBILINOGEN 1.0 09/13/2014 1845   NITRITE NEGATIVE 12/02/2017 1540   LEUKOCYTESUR NEGATIVE 12/02/2017 1540    Radiological Exams on Admission: Ct Head Wo Contrast  Result Date: 12/02/2017 CLINICAL DATA:  Altered level of consciousness.  Recent stroke. EXAM: CT HEAD WITHOUT CONTRAST TECHNIQUE: Contiguous axial images were obtained from the base of the skull through the vertex without intravenous contrast. COMPARISON:  Head CT from 9 days ago. FINDINGS: Brain: Subacute infarct in the right superior cerebellum and right posterior-lateral midbrain. No visible acute or interval infarct. No hemorrhage, hydrocephalus, or collection. Cerebral volume loss with  ventriculomegaly. Mild chronic small vessel ischemia in the cerebral white matter. Vascular: Atherosclerotic calcification.  No hyperdense vessel. Skull: No acute or aggressive finding Sinuses/Orbits: Negative IMPRESSION: 1. Known subacute right superior cerebellar infarct. No hemorrhagic conversion. 2. No acute or interval abnormality. Electronically Signed   By: Monte Fantasia M.D.   On: 12/02/2017 16:38   Dg Chest Portable 1  View  Result Date: 12/02/2017 CLINICAL DATA:  Unresponsive, recent brainstem stroke, follow-up possible pneumonia on prior chest radiograph EXAM: PORTABLE CHEST 1 VIEW COMPARISON:  11/23/2017 FINDINGS: Lingular opacity, unchanged, favoring atelectasis/scarring although pneumonia is not excluded. Left lung base is obscured, although this favors overlying soft tissue, as this appearance is chronic dating back to 2016. No frank interstitial edema.  Right lung is clear.  No pneumothorax. The heart is normal in size. IMPRESSION: Lingular opacity, unchanged, possibly scarring, pneumonia not excluded. Electronically Signed   By: Julian Hy M.D.   On: 12/02/2017 17:05    EKG: Independently reviewed. ST at 107bpm with IVCD and LVH with associated ST changes.  Assessment/Plan Principal Problem:   Encephalopathy acute Active Problems:   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   Diabetes (HCC)   Chronic pain syndrome (LEGS/BACK)   History of pulmonary embolus (PE)   Hypertension   Paroxysmal atrial fibrillation (HCC)   CVA, old, dysphagia   Pneumonia    1. Acute encephalopathy-likely metabolic.  This is in the face of multiple medical problems and I feel that due to poor oral intake dehydration may be a contributor along with possibility of sepsis.  Continue Zosyn and vancomycin with blood cultures that have been obtained.  No other obvious source noted at this time.  Continue IV fluid hydration and recheck lactic acid in a.m.  Maintain on half-normal saline for hypernatremia.   Consultation to palliative care due to multiple readmissions with what appears to be a very poor prognosis-may benefit from hospice care in the near future. 2. Sepsis.  Continue vancomycin and Zosyn with blood cultures pending.  No sign of UTI or new pneumonia at this time, but chronic signs of lingular pneumonia noted on chest x-ray from previous. Follow blood cultures. 3. Dysphagia with poor oral intake.  Likely related to above factors.  Consult SLP for further evaluation of her ability to swallow at this time.  Allow n.p.o. except sips and meds. 4. Hypertension.  Continue metoprolol. 5. PAF.  Hold Xarelto for now due to question of possible bleeding with some mild worsening anemia that could simply be secondary to dehydration.  Continue metoprolol and monitor on telemetry. 6. Worsening anemia.  Questionable bleed will check H&H every 6 hours and monitor carefully in case transfusion is needed.  Hold antiplatelet and anticoagulants. 7. Diabetes with mild hyperglycemia.  Sliding scale insulin with every 4 hour Accu-Cheks.  Hold oral hypoglycemic agents. 8. Chronic pain syndrome.  Hold narcotic medications at this time until encephalopathy improves. 9. Prior right cerebellar and dorsal lateral pons CVA.  Hold aspirin for now and monitor H&H every 6 hours. 10. Sarcoidosis.  Continue chronic prednisone.   Patient appears to have aspirated with a sip of water and some medications given in the ED and is currently on 6 L nasal cannula.  Made patient n.p.o. with no sips or medications at this time.  Changed metoprolol to IV 2.5 mg every 6 hours.  Chest x-ray pending.  Continue admission to telemetry. SLP evaluation as previously ordered.  DVT prophylaxis: SCDs Code Status: DNR Family Communication: None at bedside Disposition Plan: Evaluate and monitor encephalopathy for improvement on IV fluid and antibiotics with consultation to palliative care as well as speech and nutrition Consults called:  Palliative care, SLP, nutrition Admission status: Observation, telemetry   Abeera Flannery Darleen Crocker DO [facility] Pager [phone]  If 7PM-7AM, please contact night-coverage [email or web address] Password [facility]  12/02/2017, 9:03 PM

## 2017-12-02 NOTE — ED Notes (Signed)
Elizabeth Drake

## 2017-12-02 NOTE — Progress Notes (Signed)
Pharmacy Antibiotic Note  Elizabeth Drake is a 77 y.o. female admitted on 12/02/2017 with sepsis.  Pharmacy has been consulted for Vancomycin and Zosyn dosing.  Plan: Vancomycin 1000 mg IV x 1 dose Vancomycin 750 mg IV every 24 hours.  Goal trough 15-20 mcg/mL. Zosyn 3.375g IV q8h (4 hour infusion).  Monitor labs, c/s, and vanco trough as indicated  Weight: 111 lb (50.3 kg)  No data recorded.  Recent Labs  Lab 11/26/17 0333  CREATININE 0.65    Estimated Creatinine Clearance: 46 mL/min (by C-G formula based on SCr of 0.65 mg/dL).    Allergies  Allergen Reactions  . Advil [Ibuprofen] Shortness Of Breath and Swelling  . Propoxyphene Swelling  . Azo [Phenazopyridine] Other (See Comments)    Unknown reaction  . Indocin [Indomethacin] Swelling and Other (See Comments)    Unknown reaction  . Keflex [Cephalexin] Other (See Comments)  . Loratadine Other (See Comments)    unknown  . Percocet [Oxycodone-Acetaminophen] Itching    10/12/15 Patient denies allergy to APAP    Antimicrobials this admission: Vanco 4/29 >>  Zosyn 4/29 >>   Dose adjustments this admission: N/A  Microbiology results: 4/29 BCx: pending 4/29 UCx: pending   Thank you for allowing pharmacy to be a part of this patient's care.  Margot Ables, PharmD Clinical Pharmacist 12/02/2017 3:45 PM

## 2017-12-02 NOTE — ED Provider Notes (Signed)
Emergency Department Provider Note   I have reviewed the triage vital signs and the nursing notes.   HISTORY  Chief Complaint Altered Mental Status   HPI Elizabeth Drake is a 77 y.o. female with multiple medical problems as documented below the presents to the emergency department today secondary to "altered mental status" noticed by the facility that she is of the for 2 days.  From my understanding of the chart she was in hospital for about a week with large brainstem stroke and was not verbally responsive but would respond with her eyes and follow some commands and she was discharged to skilled nursing facility.  They noticed over the last day or 2 that her mental status had decreased.  Patient is not verbal is not able to supply any history. No other associated or modifying symptoms.   Level V Caveat Secondary to being nonverbal.   Past Medical History:  Diagnosis Date  . Arthritis   . Atrial fibrillation ([place])   . Atrial fibrillation with RVR ([place]) 09/13/2014  . Chronic pain   . Diabetes mellitus without complication ([place])   . DVT (deep venous thrombosis) ([place])   . Hiatal hernia   . Hypertension   . Osteoporosis   . Pulmonary emboli ([place])   . Sarcoidosis   . Vertigo   . Wheelchair bound     Patient Active Problem List   Diagnosis Date Noted  . CVA, old, dysphagia 12/02/2017  . Pneumonia 12/02/2017  . Encephalopathy acute 12/02/2017  . Cerebral embolism with cerebral infarction 11/24/2017  . Pressure injury of skin 11/24/2017  . Altered mental status   . Paroxysmal atrial fibrillation (H[facility])   . Obtundation 11/23/2017  . Consolidation of left lower lobe of lung ([place]) 11/23/2017  . Encounter for long-term use of opiate analgesic 06/12/2016  . Pain management contract agreement 06/12/2016  . Protein-calorie malnutrition, severe 06/28/2015  . Closed left hip fracture ([place]) 06/25/2015  . Hypertension 10/12/2013  . Physical deconditioning 06/30/2013  . History of  pulmonary embolus (PE) 06/24/2013  . Diabetes ([place]) 04/25/2013  . OAB (overactive bladder) 04/25/2013  . Wheelchair bound 04/25/2013  . Urinary tract infection without hematuria 04/25/2013  . Osteoporosis 04/25/2013  . Chronic pain syndrome (LEGS/BACK) 04/25/2013  . OA (osteoarthritis) 04/25/2013  . Hyperlipidemia 12/31/2012  . 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 ABDOMINAL HYSTERECTOMY    . I&D EXTREMITY Left 09/13/2014   Procedure: IRRIGATION AND DEBRIDEMENT EXTREMITY;  Surgeon: Linna Hoff, MD;  Location: [facility];  Service: Orthopedics;  Laterality: Left;  . INTRAMEDULLARY (IM) NAIL INTERTROCHANTERIC Left 06/27/2015   Procedure: INTRAMEDULLARY (IM) NAIL INTERTROCHANTRIC AFFIXIS;  Surgeon: Marybelle Killings, MD;  Location: [facility];  Service: Orthopedics;  Laterality: Left;  . JOINT REPLACEMENT     toe  . LUNG LOBECTOMY Left    Lower      Allergies Advil [ibuprofen]; Propoxyphene; Azo [phenazopyridine]; Indocin [indomethacin]; Keflex [cephalexin]; Loratadine; and Percocet [oxycodone-acetaminophen]  Family History  Problem Relation Age of Onset  . Heart disease Mother   . Hypertension Mother   . Cancer Father        pancratic  . Cancer - Other Sister     Social History Social History   Tobacco Use  . Smoking status: Former Smoker    Types: Cigarettes    Last attempt to quit: 12/31/2000    Years since quitting: 16.9  . Smokeless tobacco: Never Used  Substance  Use Topics  . Alcohol use: No  . Drug use: No    Review of Systems  Level V Caveat Secondary to being nonverbal.  ____________________________________________   PHYSICAL EXAM:  Vitals:   12/02/17 2030 12/02/17 2100 12/02/17 2230 12/02/17 2326  BP: (!) 143/75 139/80 139/81 124/65  Pulse: 82 84 93 92  Resp: 20 (!) 24 (!) 24   Temp:    98.3 F (36.8 C)  TempSrc:    Oral  SpO2: 96% 93% 98% 99%  Weight:        Constitutional: Alert. Well  appearing and in no acute distress. Eyes: Conjunctivae are normal. Disconjugate gaze vs strabismus. Head: Atraumatic. Nose: No congestion/rhinnorhea. Mouth/Throat: Mucous membranes are dry with dark maroon colored material dried up on them.  Oropharynx non-erythematous. Neck: No stridor.  No meningeal signs.   Cardiovascular: tachycardic rate, irregular rhythm. Good peripheral circulation. Grossly normal heart sounds.   Respiratory: tachypneic respiratory effort.  No retractions. Lungs CTAB. Gastrointestinal: Soft and nontender. No distention.  Musculoskeletal: No lower extremity tenderness nor edema. No gross deformities of extremities. Neurologic: moves eyes to command but no other purposeful movements.  Skin:  Skin is warm, dry and intact. No rash noted.  ____________________________________________   LABS (all labs ordered are listed, but only abnormal results are displayed)  Labs Reviewed  COMPREHENSIVE METABOLIC PANEL - Abnormal; Notable for the following components:      Result Value   Sodium 148 (*)    Glucose, Bld 198 (*)    BUN 50 (*)    Calcium 10.5 (*)    Total Protein 10.5 (*)    ALT 11 (*)    GFR calc non Af Amer 59 (*)    All other components within normal limits  CBC WITH DIFFERENTIAL/PLATELET - Abnormal; Notable for the following components:   WBC 10.8 (*)    MCV 101.3 (*)    Neutro Abs 8.1 (*)    All other components within normal limits  URINALYSIS, ROUTINE W REFLEX MICROSCOPIC - Abnormal; Notable for the following components:   Color, Urine AMBER (*)    APPearance HAZY (*)    Hgb urine dipstick LARGE (*)    Ketones, ur 5 (*)    Protein, ur 100 (*)    All other components within normal limits  GLUCOSE, CAPILLARY - Abnormal; Notable for the following components:   Glucose-Capillary 126 (*)    All other components within normal limits  I-STAT CHEM 8, ED - Abnormal; Notable for the following components:   Sodium 149 (*)    Chloride 112 (*)    BUN 39 (*)     Glucose, Bld 180 (*)    Hemoglobin 11.9 (*)    HCT 35.0 (*)    All other components within normal limits  CBG MONITORING, ED - Abnormal; Notable for the following components:   Glucose-Capillary 176 (*)    All other components within normal limits  CULTURE, BLOOD (ROUTINE X 2)  CULTURE, BLOOD (ROUTINE X 2)  URINE CULTURE  MRSA PCR SCREENING  BASIC METABOLIC PANEL  CBC  LACTIC ACID, PLASMA  HEMOGLOBIN AND HEMATOCRIT, BLOOD  HEMOGLOBIN AND HEMATOCRIT, BLOOD  HEMOGLOBIN AND HEMATOCRIT, BLOOD  HEMOGLOBIN AND HEMATOCRIT, BLOOD  I-STAT CG4 LACTIC ACID, ED  I-STAT CG4 LACTIC ACID, ED   ____________________________________________  EKG   EKG Interpretation  Date/Time:  Monday December 02 2017 15:03:06 EDT Ventricular Rate:  107 PR Interval:    QRS Duration: 117 QT Interval:  357 QTC Calculation: 477  R Axis:   -83 Text Interpretation:  Sinus tachycardia Nonspecific IVCD with LAD Probable left ventricular hypertrophy ST elevation, consider anterior injury suspect changes from IVCD and rate Confirmed by Merrily Pew [phone]) on 12/02/2017 3:07:21 PM       ____________________________________________  RADIOLOGY  Ct Head Wo Contrast  Result Date: 12/02/2017 CLINICAL DATA:  Altered level of consciousness.  Recent stroke. EXAM: CT HEAD WITHOUT CONTRAST TECHNIQUE: Contiguous axial images were obtained from the base of the skull through the vertex without intravenous contrast. COMPARISON:  Head CT from 9 days ago. FINDINGS: Brain: Subacute infarct in the right superior cerebellum and right posterior-lateral midbrain. No visible acute or interval infarct. No hemorrhage, hydrocephalus, or collection. Cerebral volume loss with ventriculomegaly. Mild chronic small vessel ischemia in the cerebral white matter. Vascular: Atherosclerotic calcification.  No hyperdense vessel. Skull: No acute or aggressive finding Sinuses/Orbits: Negative IMPRESSION: 1. Known subacute right superior cerebellar  infarct. No hemorrhagic conversion. 2. No acute or interval abnormality. Electronically Signed   By: Monte Fantasia M.D.   On: 12/02/2017 16:38   Dg Chest Portable 1 View  Result Date: 12/02/2017 CLINICAL DATA:  Unresponsive, recent brainstem stroke, follow-up possible pneumonia on prior chest radiograph EXAM: PORTABLE CHEST 1 VIEW COMPARISON:  11/23/2017 FINDINGS: Lingular opacity, unchanged, favoring atelectasis/scarring although pneumonia is not excluded. Left lung base is obscured, although this favors overlying soft tissue, as this appearance is chronic dating back to 2016. No frank interstitial edema.  Right lung is clear.  No pneumothorax. The heart is normal in size. IMPRESSION: Lingular opacity, unchanged, possibly scarring, pneumonia not excluded. Electronically Signed   By: Julian Hy M.D.   On: 12/02/2017 17:05   Dg Chest Port 1v Same Day  Result Date: 12/02/2017 CLINICAL DATA:  Aspiration. EXAM: PORTABLE CHEST 1 VIEW COMPARISON:  December 02, 2017 FINDINGS: A tubular density projected over the right upper to mid lung was not seen earlier today. Left retrocardiac opacity remains unchanged. The heart, hila, mediastinum, lungs, and pleura are otherwise normal. IMPRESSION: A tubular/thickened platelike opacity in the right upper lung likely represents either something on the patient or atelectasis. This does not have the typical appearance of an infiltrate or aspiration. This was not seen earlier today. Persistent opacity in the left base, unchanged. No other changes. Electronically Signed   By: Dorise Bullion III M.D   On: 12/02/2017 22:14    ____________________________________________   PROCEDURES  Procedure(s) performed:   Procedures   ____________________________________________   INITIAL IMPRESSION / ASSESSMENT AND PLAN / ED COURSE   Elevated rectal temperature, tachycardia, recent hospitlization and UTI. Will treat empirically for sepsis.  Also concern for possible  seizure as the dark maroon material in her mouth could be dried blood, ct/labs.  Could be evolution of or worsening of previous brainstem stroke, ct head.   foiund to have elevated BUN c/w dehydration. No other obvious abnormalities. Persistently afebrile here so was just fluid hydrated.  Recheck of her BUN and it was still 39.  As the patient is likely to still have decreased intake and did not feel that it was appropriate to discharge her she would likely be back soon with dehydration again just as bad or even worse.  Discussed with the hospitalist and will admit for rehydration and consideration of G-tube versus palliative care talks. Husband updated throughout process.   Patient thirsty and asked for water.  After the first set the patient coughed and then desatted to the upper 60s.  On my evaluation  the patient head was slumped over.  Her mental status was just as it was before.  She was put on 4 L nasal cannula oxygen and repositioned to have a patent airway and her oxygen saturation went up to 100% quickly.  Suspect she needs speech-language pathology evaluation. Repeat xr unchanged.   Pertinent labs & imaging results that were available during my care of the patient were reviewed by me and considered in my medical decision making (see chart for details).  ____________________________________________  FINAL CLINICAL IMPRESSION(S) / ED DIAGNOSES  Final diagnoses:  Dehydration     MEDICATIONS GIVEN DURING THIS VISIT:  Medications  piperacillin-tazobactam (ZOSYN) IVPB 3.375 g (has no administration in time range)  vancomycin (VANCOCIN) IVPB 750 mg/150 ml premix (has no administration in time range)  fluticasone (FLONASE) 50 MCG/ACT nasal spray 2 spray (has no administration in time range)  0.45 % sodium chloride infusion ( Intravenous New Bag/Given 12/02/17 2226)  acetaminophen (TYLENOL) tablet 650 mg (has no administration in time range)    Or  acetaminophen (TYLENOL) suppository 650  mg (has no administration in time range)  ondansetron (ZOFRAN) tablet 4 mg (has no administration in time range)    Or  ondansetron (ZOFRAN) injection 4 mg (has no administration in time range)  insulin aspart (novoLOG) injection 0-9 Units (2 Units Subcutaneous Given 12/02/17 2226)  metoprolol tartrate (LOPRESSOR) injection 2.5 mg (has no administration in time range)  piperacillin-tazobactam (ZOSYN) IVPB 3.375 g (0 g Intravenous Stopped 12/02/17 1612)  vancomycin (VANCOCIN) IVPB 1000 mg/200 mL premix (0 mg Intravenous Stopped 12/02/17 1732)  lactated ringers bolus 1,000 mL (0 mLs Intravenous Stopped 12/02/17 1720)  lactated ringers bolus 1,000 mL (0 mLs Intravenous Stopped 12/02/17 1924)     NEW OUTPATIENT MEDICATIONS STARTED DURING THIS VISIT:  Current Discharge Medication List      Note:  This note was prepared with assistance of Dragon voice recognition software. Occasional wrong-word or sound-a-like substitutions may have occurred due to the inherent limitations of voice recognition software.   Zanai Mallari, Corene Cornea, MD 12/03/17 [phone]

## 2017-12-02 NOTE — ED Notes (Addendum)
Pt stated she wanted some water. Gave nurse 1 sip of water and O2 sats started dropping to 60%. Notified Dr. Dayna Barker and suctioned patient. Started on 6L O2. Sats now up to 99%.

## 2017-12-03 ENCOUNTER — Other Ambulatory Visit: Payer: Self-pay

## 2017-12-03 ENCOUNTER — Encounter (HOSPITAL_COMMUNITY): Payer: Self-pay | Admitting: *Deleted

## 2017-12-03 DIAGNOSIS — D638 Anemia in other chronic diseases classified elsewhere: Secondary | ICD-10-CM | POA: Diagnosis present

## 2017-12-03 DIAGNOSIS — D869 Sarcoidosis, unspecified: Secondary | ICD-10-CM | POA: Diagnosis not present

## 2017-12-03 DIAGNOSIS — Z993 Dependence on wheelchair: Secondary | ICD-10-CM | POA: Diagnosis not present

## 2017-12-03 DIAGNOSIS — I69321 Dysphasia following cerebral infarction: Secondary | ICD-10-CM | POA: Diagnosis not present

## 2017-12-03 DIAGNOSIS — G9340 Encephalopathy, unspecified: Secondary | ICD-10-CM | POA: Diagnosis present

## 2017-12-03 DIAGNOSIS — E43 Unspecified severe protein-calorie malnutrition: Secondary | ICD-10-CM

## 2017-12-03 DIAGNOSIS — R627 Adult failure to thrive: Secondary | ICD-10-CM

## 2017-12-03 DIAGNOSIS — Z86711 Personal history of pulmonary embolism: Secondary | ICD-10-CM | POA: Diagnosis not present

## 2017-12-03 DIAGNOSIS — Z515 Encounter for palliative care: Secondary | ICD-10-CM

## 2017-12-03 DIAGNOSIS — E870 Hyperosmolality and hypernatremia: Secondary | ICD-10-CM

## 2017-12-03 DIAGNOSIS — Z743 Need for continuous supervision: Secondary | ICD-10-CM | POA: Diagnosis not present

## 2017-12-03 DIAGNOSIS — G9341 Metabolic encephalopathy: Secondary | ICD-10-CM | POA: Diagnosis present

## 2017-12-03 DIAGNOSIS — R0602 Shortness of breath: Secondary | ICD-10-CM | POA: Diagnosis not present

## 2017-12-03 DIAGNOSIS — Z681 Body mass index (BMI) 19 or less, adult: Secondary | ICD-10-CM | POA: Diagnosis not present

## 2017-12-03 DIAGNOSIS — I480 Paroxysmal atrial fibrillation: Secondary | ICD-10-CM | POA: Diagnosis not present

## 2017-12-03 DIAGNOSIS — E860 Dehydration: Secondary | ICD-10-CM | POA: Diagnosis not present

## 2017-12-03 DIAGNOSIS — Z66 Do not resuscitate: Secondary | ICD-10-CM | POA: Diagnosis present

## 2017-12-03 DIAGNOSIS — Z7189 Other specified counseling: Secondary | ICD-10-CM

## 2017-12-03 DIAGNOSIS — E876 Hypokalemia: Secondary | ICD-10-CM | POA: Diagnosis not present

## 2017-12-03 DIAGNOSIS — M810 Age-related osteoporosis without current pathological fracture: Secondary | ICD-10-CM | POA: Diagnosis present

## 2017-12-03 DIAGNOSIS — R1310 Dysphagia, unspecified: Secondary | ICD-10-CM | POA: Diagnosis present

## 2017-12-03 DIAGNOSIS — E1159 Type 2 diabetes mellitus with other circulatory complications: Secondary | ICD-10-CM | POA: Diagnosis not present

## 2017-12-03 DIAGNOSIS — Z881 Allergy status to other antibiotic agents status: Secondary | ICD-10-CM | POA: Diagnosis not present

## 2017-12-03 DIAGNOSIS — L89151 Pressure ulcer of sacral region, stage 1: Secondary | ICD-10-CM | POA: Diagnosis present

## 2017-12-03 DIAGNOSIS — G92 Toxic encephalopathy: Secondary | ICD-10-CM

## 2017-12-03 DIAGNOSIS — A419 Sepsis, unspecified organism: Secondary | ICD-10-CM | POA: Diagnosis present

## 2017-12-03 DIAGNOSIS — I10 Essential (primary) hypertension: Secondary | ICD-10-CM | POA: Diagnosis not present

## 2017-12-03 DIAGNOSIS — J189 Pneumonia, unspecified organism: Secondary | ICD-10-CM | POA: Diagnosis present

## 2017-12-03 DIAGNOSIS — E1165 Type 2 diabetes mellitus with hyperglycemia: Secondary | ICD-10-CM | POA: Diagnosis present

## 2017-12-03 DIAGNOSIS — F05 Delirium due to known physiological condition: Secondary | ICD-10-CM | POA: Diagnosis not present

## 2017-12-03 DIAGNOSIS — I69391 Dysphagia following cerebral infarction: Secondary | ICD-10-CM | POA: Diagnosis not present

## 2017-12-03 DIAGNOSIS — G894 Chronic pain syndrome: Secondary | ICD-10-CM | POA: Diagnosis not present

## 2017-12-03 DIAGNOSIS — Z87891 Personal history of nicotine dependence: Secondary | ICD-10-CM | POA: Diagnosis not present

## 2017-12-03 LAB — GLUCOSE, CAPILLARY
GLUCOSE-CAPILLARY: 126 mg/dL — AB (ref 65–99)
GLUCOSE-CAPILLARY: 126 mg/dL — AB (ref 65–99)
GLUCOSE-CAPILLARY: 132 mg/dL — AB (ref 65–99)
GLUCOSE-CAPILLARY: 173 mg/dL — AB (ref 65–99)
Glucose-Capillary: 132 mg/dL — ABNORMAL HIGH (ref 65–99)
Glucose-Capillary: 80 mg/dL (ref 65–99)
Glucose-Capillary: 52 mg/dL — ABNORMAL LOW (ref 65–99)

## 2017-12-03 LAB — BASIC METABOLIC PANEL
Anion gap: 8 (ref 5–15)
BUN: 37 mg/dL — ABNORMAL HIGH (ref 6–20)
CALCIUM: 9.3 mg/dL (ref 8.9–10.3)
CO2: 27 mmol/L (ref 22–32)
CREATININE: 0.65 mg/dL (ref 0.44–1.00)
Chloride: 111 mmol/L (ref 101–111)
GFR calc Af Amer: >60 mL/min (ref >60)
GFR calc non Af Amer: >60 mL/min (ref >60)
GLUCOSE: 146 mg/dL — AB (ref 65–99)
Potassium: 3.4 mmol/L — ABNORMAL LOW (ref 3.5–5.1)
Sodium: 146 mmol/L — ABNORMAL HIGH (ref 135–145)

## 2017-12-03 LAB — CBC
HCT: 39.1 % (ref 36.0–46.0)
Hemoglobin: 11.7 g/dL — ABNORMAL LOW (ref 12.0–15.0)
MCH: 30.7 pg (ref 26.0–34.0)
MCHC: 29.9 g/dL — AB (ref 30.0–36.0)
MCV: 102.6 fL — ABNORMAL HIGH (ref 78.0–100.0)
PLATELETS: 146 10*3/uL — AB (ref 150–400)
RBC: 3.81 MIL/uL — ABNORMAL LOW (ref 3.87–5.11)
RDW: 13.4 % (ref 11.5–15.5)
WBC: 8.3 10*3/uL (ref 4.0–10.5)

## 2017-12-03 LAB — LACTIC ACID, PLASMA: LACTIC ACID, VENOUS: 0.9 mmol/L (ref 0.5–1.9)

## 2017-12-03 LAB — HEMOGLOBIN AND HEMATOCRIT, BLOOD
HCT: 38.2 % (ref 36.0–46.0)
HEMATOCRIT: 38.3 % (ref 36.0–46.0)
HEMATOCRIT: 37.2 % (ref 36.0–46.0)
HEMOGLOBIN: 11.4 g/dL — AB (ref 12.0–15.0)
Hemoglobin: 11 g/dL — ABNORMAL LOW (ref 12.0–15.0)
Hemoglobin: 11.3 g/dL — ABNORMAL LOW (ref 12.0–15.0)

## 2017-12-03 LAB — MRSA PCR SCREENING: MRSA by PCR: NEGATIVE

## 2017-12-03 MED ORDER — SODIUM CHLORIDE 0.9 % IV SOLN
INTRAVENOUS | Status: DC
  Administered 2017-12-03: 23:00:00 via INTRAVENOUS
  Filled 2017-12-03 (×2): qty 1000

## 2017-12-03 MED ORDER — CHLORHEXIDINE GLUCONATE 0.12 % MT SOLN
15.0000 mL | Freq: Two times a day (BID) | OROMUCOSAL | Status: DC
  Administered 2017-12-03 – 2017-12-06 (×7): 15 mL via OROMUCOSAL
  Filled 2017-12-03 (×7): qty 15

## 2017-12-03 MED ORDER — DEXTROSE 50 % IV SOLN
INTRAVENOUS | Status: AC
  Administered 2017-12-03: 50 mL
  Filled 2017-12-03: qty 50

## 2017-12-03 MED ORDER — ORAL CARE MOUTH RINSE
15.0000 mL | Freq: Two times a day (BID) | OROMUCOSAL | Status: DC
  Administered 2017-12-03 – 2017-12-06 (×7): 15 mL via OROMUCOSAL

## 2017-12-03 NOTE — Progress Notes (Addendum)
Hypoglycemic Event  CBG: 52  Treatment: D 50 IV 50 ml  Symptoms: None and none  Follow-up CBG: Time:2355 CBG Result: 150  Possible Reasons for Event: Inadequate meal intake  Comments/MD notified:[place]    Marcelino Scot, [place]

## 2017-12-03 NOTE — Evaluation (Signed)
Clinical/Bedside Swallow Evaluation Patient Details  Name: Elizabeth Drake MRN: [id number] Date of Birth: [birth year]  Today's Date: 12/03/2017 Time: SLP Start Time (ACUTE ONLY): 1333 SLP Stop Time (ACUTE ONLY): 1402 SLP Time Calculation (min) (ACUTE ONLY): 29 min  Past Medical History:  Past Medical History:  Diagnosis Date  . Arthritis   . Atrial fibrillation ([place])   . Atrial fibrillation with RVR ([place]) 09/13/2014  . Chronic pain   . Diabetes mellitus without complication ([place])   . DVT (deep venous thrombosis) ([place])   . Hiatal hernia   . Hypertension   . Osteoporosis   . Pulmonary emboli ([place])   . Sarcoidosis   . Vertigo   . Wheelchair bound    Past Surgical History:  Past Surgical History:  Procedure Laterality Date  . ABDOMINAL HYSTERECTOMY    . I&D EXTREMITY Left 09/13/2014   Procedure: IRRIGATION AND DEBRIDEMENT EXTREMITY;  Surgeon: Linna Hoff, MD;  Location: [facility];  Service: Orthopedics;  Laterality: Left;  . INTRAMEDULLARY (IM) NAIL INTERTROCHANTERIC Left 06/27/2015   Procedure: INTRAMEDULLARY (IM) NAIL INTERTROCHANTRIC AFFIXIS;  Surgeon: Marybelle Killings, MD;  Location: [facility];  Service: Orthopedics;  Laterality: Left;  . JOINT REPLACEMENT     toe  . LUNG LOBECTOMY Left    Lower   HPI:  -year-old female with recent right cerebellar and dorsal lateral pontine infarct with dysphagia and E. coli UTI who was discharged to SNF for rehab on 4/23, A. fib , PE on Xarelto, sarcoidosis on chronic prednisone, diabetes mellitus, uncontrolled blood pressure hypertension, chronic pain, arthritis, deconditioning with wheelchair-bound status for almost 2 years was sent from SNF with acute encephalopathy.  In the ED with she was septic with fever of 101.4 F, WC of 10 point 8K, sodium of 149 and lactic acid of 1.73. Pt had MBSS at [facility] on 11/25/17 with recommendation for D1/puree and honey thick liquids. Pt now with dehydration.   Assessment / Plan / Recommendation Clinical Impression  Pt with known dysphagia following acute stroke with hospitalization last week at [facility]. MBSS completed during that admission showed significant delay in swallow initiation with recommendation for puree and honey thick liquids. Nursing staff advised SLP that Pt with significant xerostomia and with dried blood tinged secretions. SLP completed oral care with significant improvement oral mucosa appearance. Pt continues to present with delay in swallow initiation, breathy/wet vocal quality at times, reduced vocal intensity, and poor oral control. Pt tolerated slow, careful feeding of puree (some oral residue) and honey thick liquids via tsp and cup with moderate verbal cues. Pt has difficulty with trunk support and needs constant assist for repositioning and cues to breathe through nose as her mouth falls open and exacerbates xerostomia. Family shown how to complete oral care. Will initiate diet of D1/puree with honey thick liquids; also recommend allowing single ice chips after oral care throughout the day to alleviate xerostomia and hopefully improve ability to continue with po intake. Education completed with family. SLP will follow during acute stay.  SLP Visit Diagnosis: Dysphagia, oropharyngeal phase (R13.12)    Aspiration Risk  Moderate aspiration risk;Risk for inadequate nutrition/hydration    Diet Recommendation Dysphagia 1 (Puree);Honey-thick liquid;Ice chips PRN after oral care   Liquid Administration via: Spoon;Cup;No straw Medication Administration: Crushed with puree Supervision: Staff to assist with self feeding;Full supervision/cueing for compensatory strategies Compensations: Slow rate;Small sips/bites;Lingual sweep for clearance of pocketing;Follow solids with liquid Postural Changes: Seated upright at 90 degrees;Remain upright for at least 30 minutes  after po intake    Other  Recommendations Oral Care Recommendations: Oral care prior to ice chip/H20;Oral care before and after  PO;Staff/trained caregiver to provide oral care Other Recommendations: Order thickener from pharmacy;Prohibited food (jello, ice cream, thin soups);Remove water pitcher;Clarify dietary restrictions   Follow up Recommendations Skilled Nursing facility      Frequency and Duration min 2x/week  1 week       Prognosis Prognosis for Safe Diet Advancement: Guarded Barriers to Reach Goals: Severity of deficits      Swallow Study   General Date of Onset: 12/02/17 HPI: -year-old female with recent right cerebellar and dorsal lateral pontine infarct with dysphagia and E. coli UTI who was discharged to SNF for rehab on 4/23, A. fib , PE on Xarelto, sarcoidosis on chronic prednisone, diabetes mellitus, uncontrolled blood pressure hypertension, chronic pain, arthritis, deconditioning with wheelchair-bound status for almost 2 years was sent from SNF with acute encephalopathy.  In the ED with she was septic with fever of 101.4 F, WC of 10 point 8K, sodium of 149 and lactic acid of 1.73. Pt had MBSS at [facility] on 11/25/17 with recommendation for D1/puree and honey thick liquids. Pt now with dehydration. Type of Study: Bedside Swallow Evaluation Previous Swallow Assessment: 4/22 MBS following acute stroke D1/HTL Diet Prior to this Study: NPO Temperature Spikes Noted: No Respiratory Status: Nasal cannula History of Recent Intubation: No Behavior/Cognition: Alert;Cooperative;Pleasant mood;Requires cueing Oral Cavity Assessment: Dried secretions;Dry;Other (comment)(dried blood) Oral Care Completed by SLP: Yes Oral Cavity - Dentition: Adequate natural dentition Vision: Impaired for self-feeding Self-Feeding Abilities: Total assist Patient Positioning: Upright in bed;Postural control interferes with function(kyphotic) Baseline Vocal Quality: Low vocal intensity;Breathy Volitional Cough: Weak Volitional Swallow: Able to elicit    Oral/Motor/Sensory Function Overall Oral Motor/Sensory Function: Mild  impairment Facial ROM: Reduced left;Suspected CN VII (facial) dysfunction Facial Symmetry: Abnormal symmetry left Lingual ROM: Within Functional Limits Lingual Symmetry: Within Functional Limits Lingual Strength: Reduced Lingual Sensation: Within Functional Limits Velum: (could not visualize) Mandible: Within Functional Limits   Ice Chips Ice chips: Impaired Presentation: Spoon Oral Phase Functional Implications: Prolonged oral transit Pharyngeal Phase Impairments: Suspected delayed Swallow   Thin Liquid Thin Liquid: Not tested    Nectar Thick Nectar Thick Liquid: Not tested   Honey Thick Honey Thick Liquid: Impaired Presentation: Cup;Spoon Oral Phase Impairments: Reduced labial seal Oral Phase Functional Implications: Right anterior spillage;Prolonged oral transit Pharyngeal Phase Impairments: Suspected delayed Swallow;Cough - Delayed   Puree Puree: Impaired Presentation: Spoon Oral Phase Impairments: Reduced lingual movement/coordination Oral Phase Functional Implications: Prolonged oral transit;Oral residue Pharyngeal Phase Impairments: Suspected delayed Swallow   Solid   Thank you,  Genene Churn, CCC-SLP [phone]    Solid: Not tested        PORTER,DABNEY 12/03/2017,7:44 PM

## 2017-12-03 NOTE — Consult Note (Signed)
Consultation Note Date: 12/03/2017   Patient Name: Elizabeth Drake  DOB: 12/08/1940  MRN: [id number]  Age / Sex: 77 y.o., female  PCP: Chevis Pretty, FNP Referring Physician: Louellen Molder, MD  Reason for Consultation: Establishing goals of care  HPI/Patient Profile: 77 y.o. 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 coli UTI and discharged to [facility] for rehab on 4/23, A. fib with RVR, PE on Xarelto, sarcoidosis chronic prednisone, diabetes, high blood pressure, chronic pain, arthritis, history of DVT, osteoporosis, wheelchair-bound, unable to walk or stand for at least 2 years admitted on 12/02/2017 with acute encephalopathy likely metabolic possibly related to dehydration and possibility of sepsis.   Clinical Assessment and Goals of Care: Elizabeth Drake is lying quietly in bed.  She does not interact with me in any meaningful way, will only flutter her eyes but not make eye contact.  Present today at bedside is spouse Elizabeth Drake and son Elizabeth Drake.  They share with me that son Elizabeth Drake who lives in [place] is Elizabeth Drake is Optician, dispensing.    We talked about Elizabeth Drake chronic health history, it seems that she has been unable to walk or stand for several years, she is Elizabeth Drake up to her wheelchair/lift chair for approximately 2 years now.  Her husband, Elizabeth Drake, is her main caregiver.  We also talk about her acute health history including an acute stroke approximately April 20.  They state that she has had trouble swallowing, been on a pured diet with thickened liquids see notes.  She was taken to [facility] for rehab, but per family she has not been able to participate in rehab.  We talked about the treatment plan including IV fluids and antibiotics.  I encouraged 24  to 48 hours for information gathering, treatment to work.  I share a diagram related to quick turnaround usually means more functional status return, slow recovery means less functional status returned.  Son Elizabeth Drake states that his goal is for his mother to stay in the hospital, he states that he felt like she was discharged from the hospital too soon.  He states speaks to "miracles".  We talked about frailty, functional decline, my worry about her ability to regain function.  I asked Elizabeth Drake about his goal for disposition.  He states that his ultimate goal would be for his wife to return home.  He states if she were at this previous level of function, he could continue to care for her.  He also states that as she is now, he cannot care for her at home.  I asked Elizabeth Drake if his wife would be okay living the rest of her life in a nursing home, son Elizabeth Drake states that Elizabeth Drake's oldest son is at Elizabeth Drake now with a stroke.  Their hope is that his mother being there would help him get out  of the room more.    We talked about the complications of dysphagia, pured diet and thickened liquids.  Mr. Umland states that his wife's weight was approximately 130 pounds at Christmas.  He states that upon admit to [facility] she was 108 pounds.  Call to son, Elizabeth Drake, medical surrogate decision-maker/?HCPOA, at [phone].  Left voicemail message asking for return call to set up an appointment for a phone conference tomorrow.  Conference with social work related to plan of care and disposition.  Conference with nursing staff related to plan of care.  Conference with hospitalist related to plan of care.  Healthcare power of attorney HCPOA -son, Elizabeth Drake [phone].  Husband Elizabeth Drake and son Elizabeth Drake both endorse that Elizabeth Drake, son of patient, is medical Ambulance person. There is no paperwork in epic chart.   SUMMARY OF RECOMMENDATIONS   24 to 48 hours for outcomes.  Code  Status/Advance Care Planning:  DNR -verified with husband at bedside.  He states that although he would like everything done for [name], she has had she did not want to be kept alive with artificial means.  Symptom Management:   Per hospitalist, no additional needs at this time.  Palliative Prophylaxis:   Oral Care and Turn Reposition  Additional Recommendations (Limitations, Scope, Preferences):  Continue to treat the treatable but no CPR, no intubation.  Psycho-social/Spiritual:   Desire for further Chaplaincy support:no  Additional Recommendations: Caregiving  Support/Resources and Education on Hospice  Prognosis:   < 3 months, or less would not be surprising based on frailty, functional decline, wheelchair-bound for 2 years, recent stroke with dysphasia along with E. coli UTI, poor by mouth intake.  Discharge Planning: To be determined, based on outcomes.  Likely return to [facility] with rehab if she is able to improve.      Primary Diagnoses: Present on Admission: . Hypertension . History of pulmonary embolus (PE) . Paroxysmal atrial fibrillation (HCC) . 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 . Chronic pain syndrome (LEGS/BACK) . Encephalopathy   I have reviewed the medical record, interviewed the patient and family, and examined the patient. The following aspects are pertinent.  Past Medical History:  Diagnosis Date  . Arthritis   . Atrial fibrillation ([place])   . Atrial fibrillation with RVR ([place]) 09/13/2014  . Chronic pain   . Diabetes mellitus without complication ([place])   . DVT (deep venous thrombosis) ([place])   . Hiatal hernia   . Hypertension   . Osteoporosis   . Pulmonary emboli ([place])   . Sarcoidosis   . Vertigo   . Wheelchair bound    Social History   Socioeconomic History  . Marital status: Married    Spouse name: Elizabeth [facility]  . Number of children: 3  . Years of education: 12+  . Highest education level: Not on file  Occupational History     Comment: retired from [employer]  . Financial resource strain: Not on file  . Food insecurity:    Worry: Not on file    Inability: Not on file  . Transportation needs:    Medical: Not on file    Non-medical: Not on file  Tobacco Use  . Smoking status: Former Smoker    Types: Cigarettes    Last attempt to quit: 12/31/2000    Years since quitting: 16.9  . Smokeless tobacco: Never Used  Substance and Sexual Activity  . Alcohol use: No  . Drug use: No  . Sexual activity: Not on  file  Lifestyle  . Physical activity:    Days per week: Not on file    Minutes per session: Not on file  . Stress: Not on file  Relationships  . Social connections:    Talks on phone: Not on file    Gets together: Not on file    Attends religious service: Not on file    Active member of club or organization: Not on file    Attends meetings of clubs or organizations: Not on file    Relationship status: Not on file  Other Topics Concern  . Not on file  Social History Narrative   Lives at home with husband   caffeine use- none   Family History  Problem Relation Age of Onset  . Heart disease Mother   . Hypertension Mother   . Cancer Father        pancratic  . Cancer - Other Sister    Scheduled Meds: . chlorhexidine  15 mL Mouth Rinse BID  . fluticasone  2 spray Each Nare Daily  . insulin aspart  0-9 Units Subcutaneous Q4H  . mouth rinse  15 mL Mouth Rinse q12n4p  . metoprolol tartrate  2.5 mg Intravenous Q6H   Continuous Infusions: . piperacillin-tazobactam (ZOSYN)  IV Stopped (12/03/17 1307)  . vancomycin     PRN Meds:.acetaminophen **OR** acetaminophen, ondansetron **OR** ondansetron (ZOFRAN) IV Medications Prior to Admission:  Prior to Admission medications   Medication Sig Start Date End Date Taking? Authorizing Provider  acetaminophen (TYLENOL) 500 MG tablet Take 2 tablets (1,000 mg total) by mouth every 8 (eight) hours. 06/29/15  Yes Ghimire, Henreitta Leber, MD  aspirin EC 325 MG  EC tablet Take 1 tablet (325 mg total) by mouth daily. 11/27/17  Yes Patrecia Pour, MD  glimepiride (AMARYL) 2 MG tablet Take 1 tablet (2 mg total) by mouth daily. 07/10/17  Yes Hassell Done, Mary-Margaret, FNP  HYDROcodone-acetaminophen (NORCO) 10-325 MG tablet Take 1 tablet by mouth 2 (two) times daily as needed (pain). 11/26/17  Yes Patrecia Pour, MD  metFORMIN (GLUCOPHAGE) 500 MG tablet Take 1 tablet (500 mg total) by mouth daily. 07/10/17  Yes Hassell Done, Mary-Margaret, FNP  metoprolol tartrate (LOPRESSOR) 25 MG tablet Take 1 tablet (25 mg total) by mouth 2 (two) times daily. 10/14/17  Yes Martin, Mary-Margaret, FNP  mometasone (NASONEX) 50 MCG/ACT nasal spray 2 sprays per nostril qd Patient taking differently: Place 2 sprays into the nose every morning. 2 sprays per nostril qd 12/21/13  Yes Oxford, Orson Ape, FNP  Multiple Vitamin (MULTIVITAMIN WITH MINERALS) TABS tablet Take 1 tablet by mouth daily.   Yes [provider]  polyethylene glycol (MIRALAX / GLYCOLAX) packet Take 17 g by mouth daily as needed. Patient taking differently: Take 17 g by mouth daily.  06/29/15  Yes Ghimire, Henreitta Leber, MD  predniSONE (DELTASONE) 5 MG tablet TAKE 1 TABLET BY MOUTH EVERY DAY WITH BREAKFAST 10/14/17  Yes Hassell Done, Mary-Margaret, FNP  rivaroxaban (XARELTO) 20 MG TABS tablet TAKE ONE TABLET BY MOUTH ONE TIME DAILY WITH SUPPER 12/01/17  Yes Patrecia Pour, MD  senna (SENOKOT) 8.6 MG TABS tablet Take 2 tablets (17.2 mg total) by mouth at bedtime. 06/29/15  Yes Ghimire, Henreitta Leber, MD  vitamin C (ASCORBIC ACID) 500 MG tablet Take 500 mg by mouth 2 (two) times daily.   Yes [provider]  zinc sulfate 220 (50 Zn) MG capsule Take 220 mg by mouth daily.   Yes [provider]  feeding supplement (  BOOST / RESOURCE BREEZE) LIQD Take 1 Container by mouth 2 (two) times daily between meals. Patient not taking: Reported on 11/24/2017 06/29/15   Jonetta Osgood, MD  meclizine (ANTIVERT) 25 MG tablet Take 1 tablet  (25 mg total) by mouth 3 (three) times daily as needed for dizziness. Patient not taking: Reported on 11/24/2017 01/01/17   Chevis Pretty, FNP  nitrofurantoin, macrocrystal-monohydrate, (MACROBID) 100 MG capsule Take 1 capsule (100 mg total) by mouth 2 (two) times daily. Patient not taking: Reported on 12/02/2017 11/26/17   Patrecia Pour, MD   Allergies  Allergen Reactions  . Advil [Ibuprofen] Shortness Of Breath and Swelling  . Propoxyphene Swelling  . Azo [Phenazopyridine] Other (See Comments)    Unknown reaction  . Indocin [Indomethacin] Swelling and Other (See Comments)    Unknown reaction  . Keflex [Cephalexin] Other (See Comments)  . Loratadine Other (See Comments)    unknown  . Percocet [Oxycodone-Acetaminophen] Itching    10/12/15 Patient denies allergy to APAP   Review of Systems  Unable to perform ROS: Acuity of condition    Physical Exam  Constitutional:  Appears acutely/chronically ill, unable to participate in her care at this time  HENT:  Head: Atraumatic.  Cardiovascular: Normal rate.  Pulmonary/Chest: Effort normal. No respiratory distress.  Abdominal: Soft. She exhibits no distension.  Musculoskeletal: She exhibits no edema.  Neurological:  Briefly opens eyes, nonverbal  Skin: Skin is warm and dry.  Nursing note and vitals reviewed.   Vital Signs: BP (!) 113/54 (BP Location: Right Arm)   Pulse 70   Temp (!) 97.5 F (36.4 C) (Oral)   Resp (!) 24   Ht 5' 5.98" (1.676 m)   Wt 48.3 kg (106 lb 7.7 oz)   SpO2 97%   BMI 17.19 kg/m  Pain Scale: PAINAD       SpO2: SpO2: 97 % O2 Device:SpO2: 97 % O2 Flow Rate: .   IO: Intake/output summary:   Intake/Output Summary (Last 24 hours) at 12/03/2017 1354 Last data filed at 12/03/2017 1307 Gross per 24 hour  Intake 100 ml  Output 2 ml  Net 98 ml    LBM: Last BM Date: (UTA) Baseline Weight: Weight: 50.3 kg (111 lb) Most recent weight: Weight: 48.3 kg (106 lb 7.7 oz)     Palliative  Assessment/Data:   Flowsheet Rows     Most Recent Value  Intake Tab  Referral Department  Hospitalist  Unit at Time of Referral  Cardiac/Telemetry Unit  Palliative Care Primary Diagnosis  Other (Comment)  Date Notified  12/02/17  Palliative Care Type  New Palliative care  Date of Admission  12/02/17  Date first seen by Palliative Care  12/03/17  # of days Palliative referral response time  1 Day(s)  # of days IP prior to Palliative referral  0  Clinical Assessment  Palliative Performance Scale Score  20%  Pain Max last 24 hours  Not able to report  Pain Min Last 24 hours  Not able to report  Dyspnea Max Last 24 Hours  Not able to report  Dyspnea Min Last 24 hours  Not able to report  Psychosocial & Spiritual Assessment  Palliative Care Outcomes  Patient/Family meeting held?  Yes  Who was at the meeting?  Husband Elizabeth Drake son Elizabeth Drake at bedside  Patient/Family wishes: Interventions discontinued/not started   Mechanical Ventilation      Time In: 1150 Time Out: 1240 Time Total: 50 minutes Greater than 50%  of this time was  spent counseling and coordinating care related to the above assessment and plan.  Signed by: Drue Novel, NP   Please contact Palliative Medicine Team phone at [phone] for questions and concerns.  For individual provider: See Shea Evans

## 2017-12-03 NOTE — Progress Notes (Signed)

## 2017-12-03 NOTE — Progress Notes (Signed)
Patient awakens when spoken to/touched.  Able to shake head yes/no to most questions.  Attempts to speak, moves lips, but no voice.  Mouth has dried blood from chapped lips and excoriated tongue caked around the edges and on tongue. Quarter size amount of mucus also dried up ion back of tongue.  Oral care performed.  Patient also able to follow fingers with eyes and has weak strength in L hand - all other extremities patient does not move when instructed

## 2017-12-03 NOTE — Progress Notes (Addendum)
Initial Nutrition Assessment  DOCUMENTATION CODES:  Severe malnutrition in context of acute illness/injury, Underweight  INTERVENTION:  Pending swallow evaluation and diet recommendations, will add appropriate supplements  NUTRITION DIAGNOSIS:  Severe Malnutrition related to poor appetite, dysphagia, acute illness(AMS) as evidenced by moderate fat depletion and Loss of 10% bw x 10 days.  GOAL:  Patient will meet greater than or equal to 90% of their needs  MONITOR:  PO intake, Diet advancement, Labs, Weight trends, I & O's, Supplement acceptance  REASON FOR ASSESSMENT:  Malnutrition Screening Tool    ASSESSMENT:  77 y/o female PMHx HTN, DM2, Paroxysmal A fib, chronic pain, sarcoidosis on chronic prednisone, CVA, dysphagia, resident of nursing home. Brought to ED from facility due to AMS x2 days as well as worsening PO intake. Admitted for further workup of AMS, thought to be of metabolic etiology and related to possible sepsis   Patient alone in room. She is largely unable to answer RD questions, though did note some slight nodding with questions.   Patient gave slight nod "yes" when RD asked if she was fed by staff at facility. Also nodded "yes" when asked if she was eating alright, as defined as a few times each day.   She seems to be able to more LUE well, though her Right side had quite limited ROM, though she is able to move it slightly when RD asked to. Unsure if she has any ability to feed self. Her tongue has what appears to be dried blood residue on it. Appears to have her natural teeth.   Per chart, her weight appears to have been quite stable up until her bed weight today, which was 106.5 lbs. Per chart, her weight has typically been in flux between 109-119 lbs for the past 5 years.   Physical Exam: Her legs are completely emaciated and devoid of any muscle. Appears she has not moved lower extremities in years. This wasting is felt to be much more likely due to disuse and  atrophy rather than malnutrition. Upper body exhibits moderate fat wasting of thorax, ribs easily palpated, as well as moderate underarm fat wasting and severe orbital fat wasting. Has severe muscle wasting of interosseous. Mild muscle wasting of deltoids/clavicular musculature  At this time, she is awaiting diet advancement. Rd asked if she has been given supplements in past and again she gave slight nod "yes". Will order diet appropriate supplements when SLP evaluates.   Labs: K:3.4, BGs 120-180, NA: 146. Albumin:3.7 Meds: IVF, IV abx   Recent Labs  Lab 12/02/17 1519 12/02/17 2001 12/03/17 0528  NA 148* 149* 146*  K 3.8 3.7 3.4*  CL 111 112* 111  CO2 22  --  27  BUN 50* 39* 37*  CREATININE 0.91 0.60 0.65  CALCIUM 10.5*  --  9.3  GLUCOSE 198* 180* 146*   NUTRITION - FOCUSED PHYSICAL EXAM:   Most Recent Value  Orbital Region  Severe depletion  Upper Arm Region  Moderate depletion  Thoracic and Lumbar Region  Moderate depletion  Buccal Region  No depletion  Temple Region  Mild depletion  Clavicle Bone Region  No depletion  Clavicle and Acromion Bone Region  No depletion  Scapular Bone Region  Unable to assess  Dorsal Hand  Severe depletion  Patellar Region  Severe depletion  Anterior Thigh Region  Severe depletion  Posterior Calf Region  Severe depletion      Diet Order:   Diet Order  Diet NPO time specified  Diet effective now          EDUCATION NEEDS:  No education needs have been identified at this time  Skin: PU Stage 1 to R sacrum, Wound to R heel,   Last BM:  Unknown  Height:  Ht Readings from Last 1 Encounters:  12/03/17 5' 5.98" (1.676 m)   Weight:  Wt Readings from Last 1 Encounters:  12/03/17 106 lb 7.7 oz (48.3 kg)   Wt Readings from Last 10 Encounters:  12/03/17 106 lb 7.7 oz (48.3 kg)  11/26/17 111 lb 15.9 oz (50.8 kg)  01/16/16 119 lb (54 kg)  06/25/15 110 lb (49.9 kg)  09/16/14 112 lb 7 oz (51 kg)  07/18/13 111 lb 15.9 oz (50.8  kg)  06/30/13 119 lb 4.3 oz (54.1 kg)  04/25/13 115 lb (52.2 kg)  04/25/13 116 lb (52.6 kg)  04/24/13 116 lb (52.6 kg)   Ideal Body Weight:  50.23 kg(Adjusted for bedbound/functional paraplegia)  BMI:  Body mass index is 17.19 kg/m.  Estimated Nutritional Needs:  Kcal:  1200-1450 (25-30 kcal/kg bw) Protein:  58-68g Pro (1.2-1.4 g/kg bw) Fluid:  >1.2 L fluid (25 ml/kg bw)  Burtis Junes RD, LDN, CNSC Clinical Nutrition Available Tues-Sat via Pager: [phone] 12/03/2017 10:58 AM

## 2017-12-03 NOTE — Progress Notes (Signed)
PROGRESS NOTE                                                                                                                                                                                                             Patient Demographics:    Devani Odonnel, is a 77 y.o. female, DOB - April 16, 1941, [id number]  Admit date - 12/02/2017   Admitting Physician Pratik Darleen Crocker, DO  Outpatient Primary MD for the patient is Chevis Pretty, [place]  LOS - 0  Outpatient Specialists: None  Chief Complaint  Patient presents with  . 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 coli UTI who was discharged to SNF for rehab on 4/23, A. fib , PE on Xarelto, sarcoidosis on chronic prednisone, diabetes mellitus, uncontrolled blood pressure hypertension, chronic pain, arthritis, deconditioning with wheelchair-bound status for almost 2 years was sent from SNF with acute encephalopathy.  In the ED with she was septic with fever of 101.4 F, WC of 10 point 8K, sodium of 149 and lactic acid of 1.73. Head CT was unremarkable for acute findings, UA and chest x-ray negative for acute infection. Patient met criteria for sepsis and was given IV Ringer lactate bolus (2 L) and empiric vancomycin and Zosyn and admitted to hospitalist service.  Subjective:   Patient responds to simple commands but is nonverbal  Assessment  & Plan :   Principal problem Acute toxic metabolic encephalopathy Likely combination of sepsis and dehydration with hypernatremia. Continue IV hydration, keep n.p.o., empiric vancomycin and Zosyn for sepsis.  Follow blood cultures (negative to date).  Avoid narcotics or benzos.  Dysphagia with poor p.o. intake Keep n.p.o.  SLP evaluation.  Allowing sips with meds only.  Paroxysmal A. fib Xarelto on hold with concern for mild  worsening anemia.  Continue metoprolol.  Essential hypertension Continue metoprolol.  Diabetes mellitus Monitor on every 4 hours sliding scale coverage.  Holding oral hypoglycemic agents.  Chronic pain syndrome Holding narcotics at this time given encephalopathy.  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 Aspirin on hold.  Monitor neurochecks.  Sarcoidosis Continue chronic prednisone.  Failure to thrive and severe protein calorie malnutrition. Multiple comorbidities with very guarded prognosis.  Patient is DNR.  Palliative care  consult appreciated and has very poor overall prognosis.  If clinically improved she may return to SNF with palliative care follow-up but if deteriorates may need to opt for hospice.    Hypernatremia Secondary to dehydration.  Monitor with IV fluids.  hypokalemia Replace with fluids.  Anemia Mild drop in H&H.  Holding Xarelto.  Aspirin and Xarelto.  Monitor in a.m.  Code Status : DNR  Family Communication  : None at bedside  Disposition Plan  : Pending clinical course in the next 24-48 hours  Barriers For Discharge : Active symptoms  Consults  :  Palliative care  Procedures  : CT head  DVT Prophylaxis  :   On Xarelto (on hold)  Lab Results  Component Value Date   PLT 146 (L) 12/03/2017    Antibiotics  :    Anti-infectives (From admission, onward)   Start     Dose/Rate Route Frequency Ordered Stop   12/03/17 1700  vancomycin (VANCOCIN) IVPB 750 mg/150 ml premix     750 mg 150 mL/hr over 60 Minutes Intravenous Every 24 hours 12/02/17 1543     12/03/17 0000  piperacillin-tazobactam (ZOSYN) IVPB 3.375 g  Status:  Discontinued     3.375 g 12.5 mL/hr over 240 Minutes Intravenous Every 8 hours 12/02/17 1518 12/02/17 1520   12/03/17 0000  piperacillin-tazobactam (ZOSYN) IVPB 3.375 g     3.375 g 12.5 mL/hr over 240 Minutes Intravenous Every 8 hours 12/02/17 1520     12/02/17 1515  piperacillin-tazobactam (ZOSYN) IVPB 3.375 g      3.375 g 100 mL/hr over 30 Minutes Intravenous  Once 12/02/17 1513 12/02/17 1612   12/02/17 1515  vancomycin (VANCOCIN) IVPB 1000 mg/200 mL premix     1,000 mg 200 mL/hr over 60 Minutes Intravenous  Once 12/02/17 1513 12/02/17 1732        Objective:   Vitals:   12/02/17 2326 12/03/17 0406 12/03/17 1012 12/03/17 1353  BP: 124/65 (!) 113/54  128/65  Pulse: 92 70  79  Resp:    (!) 22  Temp: 98.3 F (36.8 C) (!) 97.5 F (36.4 C)    TempSrc: Oral Oral    SpO2: 99% 97%  100%  Weight:   48.3 kg (106 lb 7.7 oz)   Height:   5' 5.98" (1.676 m)     Wt Readings from Last 3 Encounters:  12/03/17 48.3 kg (106 lb 7.7 oz)  11/26/17 50.8 kg (111 lb 15.9 oz)  01/16/16 54 kg (119 lb)     Intake/Output Summary (Last 24 hours) at 12/03/2017 1612 Last data filed at 12/03/2017 1307 Gross per 24 hour  Intake 100 ml  Output 2 ml  Net 98 ml     Physical Exam  Gen: Fatigued, poorly communicative HEENT: Dry mucosa, supple neck Chest: Coarse breath sounds bilaterally CVS: S1 and S2 irregular, no murmurs rub or gallop GI: Soft, nondistended, nontender Muscular skeletal: Warm, no edema CNS: Awake, follows simple commands, nonverbal     Data Review:    CBC Recent Labs  Lab 12/02/17 1519 12/02/17 2001 12/03/17 0004 12/03/17 0528 12/03/17 1222  WBC 10.8*  --   --  8.3  --   HGB 13.8 11.9* 11.3* 11.7* 11.4*  HCT 45.2 35.0* 38.2 39.1 38.3  PLT 206  --   --  146*  --   MCV 101.3*  --   --  102.6*  --   MCH 30.9  --   --  30.7  --   MCHC  30.5  --   --  29.9*  --   RDW 13.6  --   --  13.4  --   LYMPHSABS 1.7  --   --   --   --   MONOABS 1.0  --   --   --   --   EOSABS 0.0  --   --   --   --   BASOSABS 0.0  --   --   --   --     Chemistries  Recent Labs  Lab 12/02/17 1519 12/02/17 2001 12/03/17 0528  NA 148* 149* 146*  K 3.8 3.7 3.4*  CL 111 112* 111  CO2 22  --  27  GLUCOSE 198* 180* 146*  BUN 50* 39* 37*  CREATININE 0.91 0.60 0.65  CALCIUM 10.5*  --  9.3  AST 17   --   --   ALT 11*  --   --   ALKPHOS 58  --   --   BILITOT 0.8  --   --    ------------------------------------------------------------------------------------------------------------------ No results for input(s): CHOL, HDL, LDLCALC, TRIG, CHOLHDL, LDLDIRECT in the last 72 hours.  Lab Results  Component Value Date   HGBA1C 6.0 (H) 11/25/2017   ------------------------------------------------------------------------------------------------------------------ No results for input(s): TSH, T4TOTAL, T3FREE, THYROIDAB in the last 72 hours.  Invalid input(s): FREET3 ------------------------------------------------------------------------------------------------------------------ No results for input(s): VITAMINB12, FOLATE, FERRITIN, TIBC, IRON, RETICCTPCT in the last 72 hours.  Coagulation profile No results for input(s): INR, PROTIME in the last 168 hours.  No results for input(s): DDIMER in the last 72 hours.  Cardiac Enzymes No results for input(s): CKMB, TROPONINI, MYOGLOBIN in the last 168 hours.  Invalid input(s): CK ------------------------------------------------------------------------------------------------------------------ No results found for: BNP  Inpatient Medications  Scheduled Meds: . chlorhexidine  15 mL Mouth Rinse BID  . fluticasone  2 spray Each Nare Daily  . insulin aspart  0-9 Units Subcutaneous Q4H  . mouth rinse  15 mL Mouth Rinse q12n4p  . metoprolol tartrate  2.5 mg Intravenous Q6H   Continuous Infusions: . piperacillin-tazobactam (ZOSYN)  IV Stopped (12/03/17 1307)  . vancomycin     PRN Meds:.acetaminophen **OR** acetaminophen, ondansetron **OR** ondansetron (ZOFRAN) IV  Micro Results Recent Results (from the past 240 hour(s))  Urine culture     Status: Abnormal   Collection Time: 11/23/17  5:40 PM  Result Value Ref Range Status   Specimen Description   Final    URINE, CATHETERIZED Performed at [facility], [address] [place],  [place] [address]    Special Requests   Final    NONE Performed at [facility], [address] [place], [place] [address]    Culture (A)  Final    >=100,000 COLONIES/mL ESCHERICHIA COLI >=100,000 COLONIES/mL GRANULICATELLA ADIACENS Standardized susceptibility testing for this organism is not available. Performed at [facility], [place] [address] [place], [place] [address]    Report Status 11/27/2017 FINAL  Final   Organism ID, Bacteria ESCHERICHIA COLI (A)  Final      Susceptibility   Escherichia coli - MIC*    AMPICILLIN >=32 RESISTANT Resistant     CEFAZOLIN 16 SENSITIVE Sensitive     CEFTRIAXONE <=1 SENSITIVE Sensitive     CIPROFLOXACIN >=4 RESISTANT Resistant     GENTAMICIN <=1 SENSITIVE Sensitive     IMIPENEM <=0.25 SENSITIVE Sensitive     NITROFURANTOIN <=16 SENSITIVE Sensitive     TRIMETH/SULFA >=320 RESISTANT Resistant     AMPICILLIN/SULBACTAM >=32 RESISTANT Resistant  PIP/TAZO <=4 SENSITIVE Sensitive     Extended ESBL NEGATIVE Sensitive     * >=100,000 COLONIES/mL ESCHERICHIA COLI  Culture, blood (routine x 2)     Status: None   Collection Time: 11/23/17  7:10 PM  Result Value Ref Range Status   Specimen Description BLOOD LEFT WRIST  Final   Special Requests   Final    BOTTLES DRAWN AEROBIC ONLY Blood Culture results may not be optimal due to an inadequate volume of blood received in culture bottles DRAWN BY RN   Culture   Final    NO GROWTH 5 DAYS Performed at [facility], [address] [place], [place] [address]    Report Status 11/28/2017 FINAL  Final  Culture, blood (routine x 2)     Status: None   Collection Time: 11/23/17  7:44 PM  Result Value Ref Range Status   Specimen Description BLOOD LEFT ARM  Final   Special Requests   Final    BOTTLES DRAWN AEROBIC ONLY Blood Culture results may not be optimal due to an inadequate volume of blood received in culture bottles   Culture   Final    NO GROWTH 5 DAYS Performed at [facility], [address],  [place], [place] [address]    Report Status 11/28/2017 FINAL  Final  MRSA PCR Screening     Status: None   Collection Time: 11/24/17  2:54 PM  Result Value Ref Range Status   MRSA by PCR NEGATIVE NEGATIVE Final    Comment:        The GeneXpert MRSA Assay (FDA approved for NASAL specimens only), is one component of a comprehensive MRSA colonization surveillance program. It is not intended to diagnose MRSA infection nor to guide or monitor treatment for MRSA infections. Performed at [facility], [place] [address] [place], [place] [address]   Blood Culture (routine x 2)     Status: None (Preliminary result)   Collection Time: 12/02/17  3:20 PM  Result Value Ref Range Status   Specimen Description BLOOD LEFT WRIST  Final   Special Requests   Final    BOTTLES DRAWN AEROBIC AND ANAEROBIC Blood Culture adequate volume   Culture   Final    NO GROWTH < 24 HOURS Performed at [facility], [address], [place], [place] [address]    Report Status PENDING  Incomplete  Blood Culture (routine x 2)     Status: None (Preliminary result)   Collection Time: 12/02/17  3:21 PM  Result Value Ref Range Status   Specimen Description LEFT ANTECUBITAL  Final   Special Requests   Final    BOTTLES DRAWN AEROBIC AND ANAEROBIC Blood Culture adequate volume   Culture   Final    NO GROWTH < 24 HOURS Performed at [facility], [address], [place], [place] [address]    Report Status PENDING  Incomplete  MRSA PCR Screening     Status: None   Collection Time: 12/02/17 11:38 PM  Result Value Ref Range Status   MRSA by PCR NEGATIVE NEGATIVE Final    Comment:        The GeneXpert MRSA Assay (FDA approved for NASAL specimens only), is one component of a comprehensive MRSA colonization surveillance program. It is not intended to diagnose MRSA infection nor to guide or monitor treatment for MRSA infections. Performed at [facility], [address], [place],  [address]     Radiology  Reports Ct Angio Head W/cm &/or Wo Cm  Result Date: 11/23/2017 CLINICAL DATA:  77 y/o  F; altered mental status of unclear cause. EXAM: CT ANGIOGRAPHY HEAD AND NECK CT PERFUSION BRAIN TECHNIQUE: Multidetector CT imaging of the head and neck was performed using the standard protocol during bolus administration of intravenous contrast. Multiplanar CT image reconstructions and MIPs were obtained to evaluate the vascular anatomy. Carotid stenosis measurements (when applicable) are obtained utilizing NASCET criteria, using the distal internal carotid diameter as the denominator. Multiphase CT imaging of the brain was performed following IV bolus contrast injection. Subsequent parametric perfusion maps were calculated using RAPID software. CONTRAST:  175m ISOVUE-370 IOPAMIDOL (ISOVUE-370) INJECTION 76% COMPARISON:  11/23/2017 CT head.  12/09/2004 MRI head. FINDINGS: CT HEAD FINDINGS Brain: Region of hypoattenuation within the right superior cerebellar hemisphere and extending into the right hemi pons compatible with late acute to subacute infarction (series 5, image 413and series 3, image 12). No associated hemorrhage or mass effect. No additional region of acute infarction, hemorrhage, or mass effect in the brain identified. Stable background of chronic microvascular ischemic changes and parenchymal volume loss of the supratentorial brain. Several small chronic lacunar infarcts within basal ganglia. Vascular: As below. Skull: Normal. Negative for fracture or focal lesion. Sinuses/Orbits: No acute finding. Other: None. ASPECTS ([place] Early CT Score) - Ganglionic level infarction (caudate, lentiform nuclei, internal capsule, insula, M1-M3 cortex): 7 - Supraganglionic infarction (M4-M6 cortex): 3 Total score (0-10 with 10 being normal): 10 Review of the MIP images confirms the above findings CTA NECK FINDINGS Aortic arch: Standard branching. Imaged portion shows no evidence of aneurysm or dissection. No  significant stenosis of the major arch vessel origins. Mild calcific atherosclerosis. Right carotid system: No evidence of dissection, stenosis (50% or greater) or occlusion. Mild non stenotic calcific atherosclerosis of the carotid bifurcation. Left carotid system: No evidence of dissection, stenosis (50% or greater) or occlusion. Mild non stenotic calcific atherosclerosis of the carotid bifurcation. Vertebral arteries: Left dominant. No evidence of dissection, stenosis (50% or greater) or occlusion. Skeleton: C6-T1 severe erosive discogenic degenerative changes with endplate eburnation and grade 1 anterolisthesis at the C7-T1 level with there is severe facet arthritis. Bones are demineralized. Other neck: Multiple thyroid nodules measuring up to 9 mm on the left. Upper chest: Negative. Review of the MIP images confirms the above findings CTA HEAD FINDINGS Anterior circulation: No significant stenosis, proximal occlusion, aneurysm, or vascular malformation. The right ICA is larger than the left ICA likely due to variant anatomy where bilateral ACA are perfused by the right A1. Posterior circulation: Left P1 short segment of mild stenosis and right P1/P2 tandem segments of severe stenosis. Irregularity and mild stenosis of the left SCA origin. The right SCA attenuates in caliber early and appears occluded (series 12, image 120 and series 15, image 26). Venous sinuses: As permitted by contrast timing, patent. Anatomic variants: Anterior communicating artery and right posterior communicating artery. No left posterior communicating artery identified, likely hypoplastic or absent. Review of the MIP images confirms the above findings. CT Brain Perfusion Findings: CBF (<30%) Volume: 327mPerfusion (Tmax>6.0s) volume: 3262mismatch Volume: 61m50mfarction Location:Right hemi pons and superior cerebellar hemisphere infarction. The area of infarction on noncontrast CT of head is larger than CBF (<30%) Volume indicating  pseudonormalization of a region of the core infarction. IMPRESSION: CT head: 1. 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 No hemorrhage or associated mass effect. 2. Stable supratentorial chronic microvascular ischemic changes and parenchymal volume loss of the brain. CTA  neck: 1. Patent carotid and vertebral arteries. No dissection, aneurysm, or hemodynamically significant stenosis utilizing NASCET criteria. 2. Severe cervical spondylosis at the C6-T1 levels. CTA head: 1. Right SCA occlusion. 2. Tandem segments of severe stenosis of right PCA. 3. Otherwise no large vessel occlusion, aneurysm, or significant stenosis is identified. CT brain perfusion: Perfusion anomaly in the right SCA distribution. The area of infarction on noncontrast CT of head is larger than CBF (<30%) Volume indicating pseudonormalization of a region of the core infarction. These results were called by telephone at the time of interpretation on 11/23/2017 at 9:03 pm to Dr. Francine Graven , who verbally acknowledged these results. Electronically Signed   By: Kristine Garbe M.D.   On: 11/23/2017 21:07   Dg Chest 1 View  Result Date: 11/23/2017 CLINICAL DATA:  Altered mental status EXAM: CHEST  1 VIEW COMPARISON:  09/13/2014, 08/13/2014 FINDINGS: Mildly low lung volume. Small left pleural effusion with dense airspace disease at the lingula and left base. Cardiomegaly with aortic atherosclerosis. Minimal vascular congestion. No pneumothorax. IMPRESSION: 1. Mild left pleural effusion or thickening. Airspace disease at the lingula and left base, some of this may be chronic but difficult to exclude an acute infiltrate in the region 2. Cardiomegaly with mild vascular congestion Electronically Signed   By: Donavan Foil M.D.   On: 11/23/2017 18:29   Ct Head Wo Contrast  Result Date: 12/02/2017 CLINICAL DATA:  Altered level of consciousness.  Recent stroke. EXAM: CT HEAD WITHOUT CONTRAST  TECHNIQUE: Contiguous axial images were obtained from the base of the skull through the vertex without intravenous contrast. COMPARISON:  Head CT from 9 days ago. FINDINGS: Brain: Subacute infarct in the right superior cerebellum and right posterior-lateral midbrain. No visible acute or interval infarct. No hemorrhage, hydrocephalus, or collection. Cerebral volume loss with ventriculomegaly. Mild chronic small vessel ischemia in the cerebral white matter. Vascular: Atherosclerotic calcification.  No hyperdense vessel. Skull: No acute or aggressive finding Sinuses/Orbits: Negative IMPRESSION: 1. Known subacute right superior cerebellar infarct. No hemorrhagic conversion. 2. No acute or interval abnormality. Electronically Signed   By: Monte Fantasia M.D.   On: 12/02/2017 16:38   Ct Head Wo Contrast  Result Date: 11/23/2017 CLINICAL DATA:  Lethargy following hydrocodone administration EXAM: CT HEAD WITHOUT CONTRAST CT CERVICAL SPINE WITHOUT CONTRAST TECHNIQUE: Multidetector CT imaging of the head and cervical spine was performed following the standard protocol without intravenous contrast. Multiplanar CT image reconstructions of the cervical spine were also generated. COMPARISON:  11/15/2007 FINDINGS: CT HEAD FINDINGS Brain: No evidence of acute infarction, hemorrhage, hydrocephalus, extra-axial collection or mass lesion/mass effect. Chronic atrophic and ischemic changes are noted. Vascular: No hyperdense vessel or unexpected calcification. Skull: Normal. Negative for fracture or focal lesion. Sinuses/Orbits: No acute finding. Other: None. CT CERVICAL SPINE FINDINGS Alignment: Alignment is within normal limits with the exception of degenerative anterolisthesis of C7 on T1. Skull base and vertebrae: 7 cervical segments are noted. Disc space narrowing is noted at C5-6, C6-7 and C7-T1. Significant sclerosis is noted at the C7-T1 interspace. Degenerative anterolisthesis is seen at C7-T1. Associated central canal  stenosis is noted at this level. No acute fracture or acute facet abnormality is noted. Facet hypertrophic changes are noted at multiple levels. Soft tissues and spinal canal: No prevertebral fluid or swelling. No visible canal hematoma. Upper chest: Within normal limits. Other: None IMPRESSION: CT of the head: Chronic atrophic and ischemic changes without acute abnormality. CT of the cervical spine: Multilevel degenerative change without acute abnormality Electronically  Signed   By: Inez Catalina M.D.   On: 11/23/2017 18:33   Ct Angio Neck W And/or Wo Contrast  Result Date: 11/23/2017 CLINICAL DATA:  77 y/o  F; altered mental status of unclear cause. EXAM: CT ANGIOGRAPHY HEAD AND NECK CT PERFUSION BRAIN TECHNIQUE: Multidetector CT imaging of the head and neck was performed using the standard protocol during bolus administration of intravenous contrast. Multiplanar CT image reconstructions and MIPs were obtained to evaluate the vascular anatomy. Carotid stenosis measurements (when applicable) are obtained utilizing NASCET criteria, using the distal internal carotid diameter as the denominator. Multiphase CT imaging of the brain was performed following IV bolus contrast injection. Subsequent parametric perfusion maps were calculated using RAPID software. CONTRAST:  138m ISOVUE-370 IOPAMIDOL (ISOVUE-370) INJECTION 76% COMPARISON:  11/23/2017 CT head.  12/09/2004 MRI head. FINDINGS: CT HEAD FINDINGS Brain: Region of hypoattenuation within the right superior cerebellar hemisphere and extending into the right hemi pons compatible with late acute to subacute infarction (series 5, image 454and series 3, image 12). No associated hemorrhage or mass effect. No additional region of acute infarction, hemorrhage, or mass effect in the brain identified. Stable background of chronic microvascular ischemic changes and parenchymal volume loss of the supratentorial brain. Several small chronic lacunar infarcts within basal  ganglia. Vascular: As below. Skull: Normal. Negative for fracture or focal lesion. Sinuses/Orbits: No acute finding. Other: None. ASPECTS (AParcStroke Program Early CT Score) - Ganglionic level infarction (caudate, lentiform nuclei, internal capsule, insula, M1-M3 cortex): 7 - Supraganglionic infarction (M4-M6 cortex): 3 Total score (0-10 with 10 being normal): 10 Review of the MIP images confirms the above findings CTA NECK FINDINGS Aortic arch: Standard branching. Imaged portion shows no evidence of aneurysm or dissection. No significant stenosis of the major arch vessel origins. Mild calcific atherosclerosis. Right carotid system: No evidence of dissection, stenosis (50% or greater) or occlusion. Mild non stenotic calcific atherosclerosis of the carotid bifurcation. Left carotid system: No evidence of dissection, stenosis (50% or greater) or occlusion. Mild non stenotic calcific atherosclerosis of the carotid bifurcation. Vertebral arteries: Left dominant. No evidence of dissection, stenosis (50% or greater) or occlusion. Skeleton: C6-T1 severe erosive discogenic degenerative changes with endplate eburnation and grade 1 anterolisthesis at the C7-T1 level with there is severe facet arthritis. Bones are demineralized. Other neck: Multiple thyroid nodules measuring up to 9 mm on the left. Upper chest: Negative. Review of the MIP images confirms the above findings CTA HEAD FINDINGS Anterior circulation: No significant stenosis, proximal occlusion, aneurysm, or vascular malformation. The right ICA is larger than the left ICA likely due to variant anatomy where bilateral ACA are perfused by the right A1. Posterior circulation: Left P1 short segment of mild stenosis and right P1/P2 tandem segments of severe stenosis. Irregularity and mild stenosis of the left SCA origin. The right SCA attenuates in caliber early and appears occluded (series 12, image 120 and series 15, image 26). Venous sinuses: As permitted by  contrast timing, patent. Anatomic variants: Anterior communicating artery and right posterior communicating artery. No left posterior communicating artery identified, likely hypoplastic or absent. Review of the MIP images confirms the above findings. CT Brain Perfusion Findings: CBF (<30%) Volume: 348mPerfusion (Tmax>6.0s) volume: 3285mismatch Volume: 54m65mfarction Location:Right hemi pons and superior cerebellar hemisphere infarction. The area of infarction on noncontrast CT of head is larger than CBF (<30%) Volume indicating pseudonormalization of a region of the core infarction. IMPRESSION: CT head: 1. Right SCA territory infarction involving right superior cerebellum and  hemi pons. No hemorrhage or associated mass effect. 2. Stable supratentorial chronic microvascular ischemic changes and parenchymal volume loss of the brain. CTA neck: 1. Patent carotid and vertebral arteries. No dissection, aneurysm, or hemodynamically significant stenosis utilizing NASCET criteria. 2. Severe cervical spondylosis at the C6-T1 levels. CTA head: 1. Right SCA occlusion. 2. Tandem segments of severe stenosis of right PCA. 3. Otherwise no large vessel occlusion, aneurysm, or significant stenosis is identified. CT brain perfusion: Perfusion anomaly in the right SCA distribution. The area of infarction on noncontrast CT of head is larger than CBF (<30%) Volume indicating pseudonormalization of a region of the core infarction. These results were called by telephone at the time of interpretation on 11/23/2017 at 9:03 pm to Dr. Francine Graven , who verbally acknowledged these results. Electronically Signed   By: Kristine Garbe M.D.   On: 11/23/2017 21:07   Ct Cervical Spine Wo Contrast  Result Date: 11/23/2017 CLINICAL DATA:  Lethargy following hydrocodone administration EXAM: CT HEAD WITHOUT CONTRAST CT CERVICAL SPINE WITHOUT CONTRAST TECHNIQUE: Multidetector CT imaging of the head and cervical spine was performed  following the standard protocol without intravenous contrast. Multiplanar CT image reconstructions of the cervical spine were also generated. COMPARISON:  11/15/2007 FINDINGS: CT HEAD FINDINGS Brain: No evidence of acute infarction, hemorrhage, hydrocephalus, extra-axial collection or mass lesion/mass effect. Chronic atrophic and ischemic changes are noted. Vascular: No hyperdense vessel or unexpected calcification. Skull: Normal. Negative for fracture or focal lesion. Sinuses/Orbits: No acute finding. Other: None. CT CERVICAL SPINE FINDINGS Alignment: Alignment is within normal limits with the exception of degenerative anterolisthesis of C7 on T1. Skull base and vertebrae: 7 cervical segments are noted. Disc space narrowing is noted at C5-6, C6-7 and C7-T1. Significant sclerosis is noted at the C7-T1 interspace. Degenerative anterolisthesis is seen at C7-T1. Associated central canal stenosis is noted at this level. No acute fracture or acute facet abnormality is noted. Facet hypertrophic changes are noted at multiple levels. Soft tissues and spinal canal: No prevertebral fluid or swelling. No visible canal hematoma. Upper chest: Within normal limits. Other: None IMPRESSION: CT of the head: Chronic atrophic and ischemic changes without acute abnormality. CT of the cervical spine: Multilevel degenerative change without acute abnormality Electronically Signed   By: Inez Catalina M.D.   On: 11/23/2017 18:33   Mr Brain Wo Contrast  Result Date: 11/24/2017 CLINICAL DATA:  Altered mental status EXAM: MRI HEAD WITHOUT CONTRAST TECHNIQUE: Multiplanar, multiecho pulse sequences of the brain and surrounding structures were obtained without intravenous contrast. COMPARISON:  Head CT 11/23/2017 FINDINGS: BRAIN: The midline structures are normal. There is abnormal diffusion restriction within the dorsolateral right pons and superior right cerebellum, within the right superior cerebellar artery territory, as demonstrated on  the earlier CT. No mass lesion, hydrocephalus, dural abnormality or extra-axial collection. Early confluent hyperintense T2-weighted signal of the periventricular and deep white matter, most commonly due to chronic ischemic microangiopathy. No age-advanced or lobar predominant atrophy. There are a few central chronic microhemorrhages. VASCULAR: Major intracranial arterial and venous sinus flow voids are preserved. SKULL AND UPPER CERVICAL SPINE: The visualized skull base, calvarium, upper cervical spine and extracranial soft tissues are normal. SINUSES/ORBITS: No fluid levels or advanced mucosal thickening. No mastoid or middle ear effusion. Normal orbits. IMPRESSION: 1. 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 2. No hemorrhage or mass effect. 3. Chronic ischemic microangiopathy. Electronically Signed   By: Cletus Gash.D.  On: 11/24/2017 06:03   Ct Cerebral Perfusion W Contrast  Result Date: 11/23/2017 CLINICAL DATA:  77 y/o  F; altered mental status of unclear cause. EXAM: CT ANGIOGRAPHY HEAD AND NECK CT PERFUSION BRAIN TECHNIQUE: Multidetector CT imaging of the head and neck was performed using the standard protocol during bolus administration of intravenous contrast. Multiplanar CT image reconstructions and MIPs were obtained to evaluate the vascular anatomy. Carotid stenosis measurements (when applicable) are obtained utilizing NASCET criteria, using the distal internal carotid diameter as the denominator. Multiphase CT imaging of the brain was performed following IV bolus contrast injection. Subsequent parametric perfusion maps were calculated using RAPID software. CONTRAST:  141m ISOVUE-370 IOPAMIDOL (ISOVUE-370) INJECTION 76% COMPARISON:  11/23/2017 CT head.  12/09/2004 MRI head. FINDINGS: CT HEAD FINDINGS Brain: Region of hypoattenuation within the right superior cerebellar hemisphere and extending into the right hemi  pons compatible with late acute to subacute infarction (series 5, image 472and series 3, image 12). No associated hemorrhage or mass effect. No additional region of acute infarction, hemorrhage, or mass effect in the brain identified. Stable background of chronic microvascular ischemic changes and parenchymal volume loss of the supratentorial brain. Several small chronic lacunar infarcts within basal ganglia. Vascular: As below. Skull: Normal. Negative for fracture or focal lesion. Sinuses/Orbits: No acute finding. Other: None. ASPECTS ([place]roke Program Early CT Score) - Ganglionic level infarction (caudate, lentiform nuclei, internal capsule, insula, M1-M3 cortex): 7 - Supraganglionic infarction (M4-M6 cortex): 3 Total score (0-10 with 10 being normal): 10 Review of the MIP images confirms the above findings CTA NECK FINDINGS Aortic arch: Standard branching. Imaged portion shows no evidence of aneurysm or dissection. No significant stenosis of the major arch vessel origins. Mild calcific atherosclerosis. Right carotid system: No evidence of dissection, stenosis (50% or greater) or occlusion. Mild non stenotic calcific atherosclerosis of the carotid bifurcation. Left carotid system: No evidence of dissection, stenosis (50% or greater) or occlusion. Mild non stenotic calcific atherosclerosis of the carotid bifurcation. Vertebral arteries: Left dominant. No evidence of dissection, stenosis (50% or greater) or occlusion. Skeleton: C6-T1 severe erosive discogenic degenerative changes with endplate eburnation and grade 1 anterolisthesis at the C7-T1 level with there is severe facet arthritis. Bones are demineralized. Other neck: Multiple thyroid nodules measuring up to 9 mm on the left. Upper chest: Negative. Review of the MIP images confirms the above findings CTA HEAD FINDINGS Anterior circulation: No significant stenosis, proximal occlusion, aneurysm, or vascular malformation. The right ICA is larger than the  left ICA likely due to variant anatomy where bilateral ACA are perfused by the right A1. Posterior circulation: Left P1 short segment of mild stenosis and right P1/P2 tandem segments of severe stenosis. Irregularity and mild stenosis of the left SCA origin. The right SCA attenuates in caliber early and appears occluded (series 12, image 120 and series 15, image 26). Venous sinuses: As permitted by contrast timing, patent. Anatomic variants: Anterior communicating artery and right posterior communicating artery. No left posterior communicating artery identified, likely hypoplastic or absent. Review of the MIP images confirms the above findings. CT Brain Perfusion Findings: CBF (<30%) Volume: 330mPerfusion (Tmax>6.0s) volume: 3256mismatch Volume: 20m41mfarction Location:Right hemi pons and superior cerebellar hemisphere infarction. The area of infarction on noncontrast CT of head is larger than CBF (<30%) Volume indicating pseudonormalization of a region of the core infarction. IMPRESSION: CT head: 1. 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 No hemorrhage or associated mass effect. 2. Stable supratentorial chronic  microvascular ischemic changes and parenchymal volume loss of the brain. CTA neck: 1. Patent carotid and vertebral arteries. No dissection, aneurysm, or hemodynamically significant stenosis utilizing NASCET criteria. 2. Severe cervical spondylosis at the C6-T1 levels. CTA head: 1. Right SCA occlusion. 2. Tandem segments of severe stenosis of right PCA. 3. Otherwise no large vessel occlusion, aneurysm, or significant stenosis is identified. CT brain perfusion: Perfusion anomaly in the right SCA distribution. The area of infarction on noncontrast CT of head is larger than CBF (<30%) Volume indicating pseudonormalization of a region of the core infarction. These results were called by telephone at the time of interpretation on 11/23/2017 at 9:03 pm to Dr. Francine Graven , who verbally acknowledged these results. Electronically Signed   By: Kristine Garbe M.D.   On: 11/23/2017 21:07   Dg Chest Portable 1 View  Result Date: 12/02/2017 CLINICAL DATA:  Unresponsive, recent brainstem stroke, follow-up possible pneumonia on prior chest radiograph EXAM: PORTABLE CHEST 1 VIEW COMPARISON:  11/23/2017 FINDINGS: Lingular opacity, unchanged, favoring atelectasis/scarring although pneumonia is not excluded. Left lung base is obscured, although this favors overlying soft tissue, as this appearance is chronic dating back to 2016. No frank interstitial edema.  Right lung is clear.  No pneumothorax. The heart is normal in size. IMPRESSION: Lingular opacity, unchanged, possibly scarring, pneumonia not excluded. Electronically Signed   By: Julian Hy M.D.   On: 12/02/2017 17:05   Dg Chest Port 1v Same Day  Result Date: 12/02/2017 CLINICAL DATA:  Aspiration. EXAM: PORTABLE CHEST 1 VIEW COMPARISON:  December 02, 2017 FINDINGS: A tubular density projected over the right upper to mid lung was not seen earlier today. Left retrocardiac opacity remains unchanged. The heart, hila, mediastinum, lungs, and pleura are otherwise normal. IMPRESSION: A tubular/thickened platelike opacity in the right upper lung likely represents either something on the patient or atelectasis. This does not have the typical appearance of an infiltrate or aspiration. This was not seen earlier today. Persistent opacity in the left base, unchanged. No other changes. Electronically Signed   By: Dorise Bullion III M.D   On: 12/02/2017 22:14   Dg Swallowing Func-speech Pathology  Result Date: 11/25/2017 Objective Swallowing Evaluation: Type of Study: MBS-Modified Barium Swallow Study  Patient Details Name: LEIGHANN AMADON MRN: [id number] Date of Birth: [birth year] Today's Date: 11/25/2017 Time: SLP Start Time (ACUTE ONLY): 1046 -SLP Stop Time (ACUTE ONLY): 1059 SLP Time Calculation (min) (ACUTE ONLY): 13 min  Past Medical History: Past Medical History: Diagnosis Date . Arthritis  . Atrial fibrillation ([place])  . Atrial fibrillation with RVR ([place]) 09/13/2014 . Chronic pain  . Diabetes mellitus without complication ([place])  . DVT (deep venous thrombosis) ([place])  . Hiatal hernia  . Hypertension  . Osteoporosis  . Pulmonary emboli ([place])  . Sarcoidosis  . Vertigo  . Wheelchair bound  Past Surgical History: Past Surgical History: Procedure Laterality Date . ABDOMINAL HYSTERECTOMY   . I&D EXTREMITY Left 09/13/2014  Procedure: IRRIGATION AND DEBRIDEMENT EXTREMITY;  Surgeon: Linna Hoff, MD;  Location: [facility];  Service: Orthopedics;  Laterality: Left; . INTRAMEDULLARY (IM) NAIL INTERTROCHANTERIC Left 06/27/2015  Procedure: INTRAMEDULLARY (IM) NAIL INTERTROCHANTRIC AFFIXIS;  Surgeon: Marybelle Killings, MD;  Location: [facility];  Service: Orthopedics;  Laterality: Left; . JOINT REPLACEMENT    toe . LUNG LOBECTOMY Left   Lower HPI: 77 year old female with medical history significant for type 2 diabetes mellitus, hypertension, chronic pain on opiate analgesia, pulmonary embolism, sarcoidosis initially presented to the ED poorly responsive  in setting of questionable opiod overdose, found to have neuro change. MRI acute infarct of the superior right cerebellum and right dorso lateral pons, in the superior cerebellar artery territory. CXR mild left pleural effusion or thickening. Airspace disease at the lingula and left base, some of this may be chronic but difficult to exclude an acute infiltrate in the region, cardiomegaly with mild vascular congestion.  No data recorded Assessment / Plan / Recommendation CHL IP CLINICAL IMPRESSIONS 11/25/2017 Clinical Impression Moderate oral dysphagia marked by anterior spill, decreased control and delayed oral transit. Pharyngeal phase characterized by sensory impairments with late swallow initiation at the pyriform sinuses with thinner consistencies and valleculae with honey thick. Laryngeal penetration to vocal  cords consistently with thin and nectar barium without sensation and ineffective attempts at cued throat clear/cough. No appreciable post swallow residue. Positioning somewhat challenging due to kyphosis. Recommend honey thick liquids, Dys 2 texture, crush pills and full assist and supervision with ST for safety and education.   SLP Visit Diagnosis Dysphagia, oropharyngeal phase (R13.12) Attention and concentration deficit following -- Frontal lobe and executive function deficit following -- Impact on safety and function Moderate aspiration risk   CHL IP TREATMENT RECOMMENDATION 11/25/2017 Treatment Recommendations Therapy as outlined in treatment plan below   Prognosis 11/25/2017 Prognosis for Safe Diet Advancement (No Data) Barriers to Reach Goals Cognitive deficits Barriers/Prognosis Comment -- CHL IP DIET RECOMMENDATION 11/25/2017 SLP Diet Recommendations Dysphagia 2 (Fine chop) solids;Honey thick liquids Liquid Administration via Cup Medication Administration Crushed with puree Compensations Slow rate;Small sips/bites;Lingual sweep for clearance of pocketing Postural Changes Seated upright at 90 degrees   CHL IP OTHER RECOMMENDATIONS 11/25/2017 Recommended Consults -- Oral Care Recommendations Oral care BID Other Recommendations --   CHL IP FOLLOW UP RECOMMENDATIONS 11/25/2017 Follow up Recommendations Other (comment)   CHL IP FREQUENCY AND DURATION 11/25/2017 Speech Therapy Frequency (ACUTE ONLY) min 2x/week Treatment Duration 2 weeks      CHL IP ORAL PHASE 11/25/2017 Oral Phase Impaired Oral - Pudding Teaspoon -- Oral - Pudding Cup -- Oral - Honey Teaspoon -- Oral - Honey Cup Decreased bolus cohesion;Delayed oral transit;Weak lingual manipulation;Left anterior bolus loss Oral - Nectar Teaspoon -- Oral - Nectar Cup Weak lingual manipulation;Delayed oral transit;Decreased bolus cohesion;Left anterior bolus loss Oral - Nectar Straw -- Oral - Thin Teaspoon -- Oral - Thin Cup -- Oral - Thin Straw WFL Oral - Puree Delayed  oral transit Oral - Mech Soft -- Oral - Regular Weak lingual manipulation;Delayed oral transit Oral - Multi-Consistency -- Oral - Pill -- Oral Phase - Comment --  CHL IP PHARYNGEAL PHASE 11/25/2017 Pharyngeal Phase Impaired Pharyngeal- Pudding Teaspoon -- Pharyngeal -- Pharyngeal- Pudding Cup -- Pharyngeal -- Pharyngeal- Honey Teaspoon -- Pharyngeal -- Pharyngeal- Honey Cup -- Pharyngeal -- Pharyngeal- Nectar Teaspoon -- Pharyngeal -- Pharyngeal- Nectar Cup Delayed swallow initiation-pyriform sinuses;Penetration/Aspiration during swallow Pharyngeal Material enters airway, CONTACTS cords and not ejected out Pharyngeal- Nectar Straw -- Pharyngeal -- Pharyngeal- Thin Teaspoon -- Pharyngeal -- Pharyngeal- Thin Cup -- Pharyngeal -- Pharyngeal- Thin Straw Delayed swallow initiation-pyriform sinuses;Penetration/Aspiration during swallow Pharyngeal Material enters airway, CONTACTS cords and not ejected out Pharyngeal- Puree Penetration/Aspiration during swallow Pharyngeal Material enters airway, remains ABOVE vocal cords and not ejected out Pharyngeal- Mechanical Soft -- Pharyngeal -- Pharyngeal- Regular Delayed swallow initiation-vallecula Pharyngeal -- Pharyngeal- Multi-consistency -- Pharyngeal -- Pharyngeal- Pill -- Pharyngeal -- Pharyngeal Comment --  CHL IP CERVICAL ESOPHAGEAL PHASE 11/25/2017 Cervical Esophageal Phase WFL Pudding Teaspoon -- Pudding Cup -- Honey Teaspoon -- Honey  Cup -- Surveyor, quantity Cup -- [employer] -- Thin Teaspoon -- Thin Cup -- Thin Straw -- Puree -- Mechanical Soft -- Regular -- Multi-consistency -- Pill -- Cervical Esophageal Comment -- No flowsheet data found. [place] Siren 11/25/2017, 11:56 AM Orbie Pyo Colvin Caroli.Ed CCC-SLP Pager [phone]               Time Spent in minutes  25   Delinda Malan M.D on 12/03/2017 at 4:12 PM  Between 7am to 7pm - Pager - [phone]  After 7pm go to [email or web address] - password [facility]  [facility] -  Office  [phone]

## 2017-12-04 DIAGNOSIS — D869 Sarcoidosis, unspecified: Secondary | ICD-10-CM

## 2017-12-04 DIAGNOSIS — I10 Essential (primary) hypertension: Secondary | ICD-10-CM

## 2017-12-04 DIAGNOSIS — G894 Chronic pain syndrome: Secondary | ICD-10-CM

## 2017-12-04 DIAGNOSIS — I69391 Dysphagia following cerebral infarction: Secondary | ICD-10-CM

## 2017-12-04 DIAGNOSIS — I480 Paroxysmal atrial fibrillation: Secondary | ICD-10-CM

## 2017-12-04 DIAGNOSIS — E860 Dehydration: Secondary | ICD-10-CM

## 2017-12-04 DIAGNOSIS — G9340 Encephalopathy, unspecified: Secondary | ICD-10-CM

## 2017-12-04 LAB — CBC
HEMATOCRIT: 39.4 % (ref 36.0–46.0)
HEMOGLOBIN: 11.8 g/dL — AB (ref 12.0–15.0)
MCH: 31.2 pg (ref 26.0–34.0)
MCHC: 29.9 g/dL — ABNORMAL LOW (ref 30.0–36.0)
MCV: 104.2 fL — ABNORMAL HIGH (ref 78.0–100.0)
Platelets: 124 10*3/uL — ABNORMAL LOW (ref 150–400)
RBC: 3.78 MIL/uL — AB (ref 3.87–5.11)
RDW: 13.1 % (ref 11.5–15.5)
WBC: 7.9 10*3/uL (ref 4.0–10.5)

## 2017-12-04 LAB — GLUCOSE, CAPILLARY
GLUCOSE-CAPILLARY: 108 mg/dL — AB (ref 65–99)
GLUCOSE-CAPILLARY: 119 mg/dL — AB (ref 65–99)
GLUCOSE-CAPILLARY: 121 mg/dL — AB (ref 65–99)
GLUCOSE-CAPILLARY: 141 mg/dL — AB (ref 65–99)
GLUCOSE-CAPILLARY: 150 mg/dL — AB (ref 65–99)
GLUCOSE-CAPILLARY: 231 mg/dL — AB (ref 65–99)

## 2017-12-04 LAB — URINE CULTURE: Culture: NO GROWTH

## 2017-12-04 LAB — BASIC METABOLIC PANEL
ANION GAP: 7 (ref 5–15)
BUN: 25 mg/dL — ABNORMAL HIGH (ref 6–20)
CHLORIDE: 114 mmol/L — AB (ref 101–111)
CO2: 27 mmol/L (ref 22–32)
Calcium: 8.9 mg/dL (ref 8.9–10.3)
Creatinine, Ser: 0.6 mg/dL (ref 0.44–1.00)
GFR calc non Af Amer: >60 mL/min (ref >60)
Glucose, Bld: 103 mg/dL — ABNORMAL HIGH (ref 65–99)
Potassium: 3.7 mmol/L (ref 3.5–5.1)
SODIUM: 148 mmol/L — AB (ref 135–145)

## 2017-12-04 MED ORDER — POTASSIUM CHLORIDE IN NACL 40-0.9 MEQ/L-% IV SOLN
INTRAVENOUS | Status: DC
  Administered 2017-12-04: 75 mL/h via INTRAVENOUS

## 2017-12-04 NOTE — Progress Notes (Signed)
PROGRESS NOTE                                                                                                                                                                                                             Patient Demographics:    Elizabeth Drake, is a 77 y.o. female, DOB - 04-06-1941, RJJ:[id number]  Admit date - 12/02/2017   Admitting Physician Pratik Darleen Crocker, DO  Outpatient Primary MD for the patient is Chevis Pretty, [place]  LOS - 1  Outpatient Specialists: None  Chief Complaint  Patient presents with  . 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 coli UTI who was discharged to SNF for rehab on 4/23, A. fib , PE on Xarelto, sarcoidosis on chronic prednisone, diabetes mellitus, uncontrolled blood pressure hypertension, chronic pain, arthritis, deconditioning with wheelchair-bound status for almost 2 years was sent from SNF with acute encephalopathy.  In the ED with she was septic with fever of 101.4 F, WC of 10 point 8K, sodium of 149 and lactic acid of 1.73. Head CT was unremarkable for acute findings, UA and chest x-ray negative for acute infection. Patient met criteria for sepsis and was given IV Ringer lactate bolus (2 L) and empiric vancomycin and Zosyn and admitted to hospitalist service.  Subjective:    afebrile, no chest pain, no shortness of breath.  Patient denies nausea, vomiting, abdominal pain and dysuria at this moment.  Oriented x1, able to follow simple commands and is answering yes/no intermittently.   Assessment  & Plan :   Principal problem Acute toxic metabolic encephalopathy -Likely multifactorial, but most likely metabolic (with presentation of hypernatremia) -no fever and WBC's essentially back to normal. -will stop antibiotics and observed clinical response. -diet has been  advanced  -follow for any signs of infection.  -patient denies dysuria.   Dysphagia with poor p.o. intake -appreciate rec's from [facility] -dysphagia 1 with nectar thick liquids started.  He  Paroxysmal A. fib Rate controlled -continue metoprolol -xarelto on hold for now -continue monitoring on telmetry  Essential hypertension -Continue metoprolol. -BP stable.  Diabetes mellitus -continue holding oral agents -continue SSI  Chronic pain syndrome -will be judicious with pain meds, while evaluating improvement on her mentation.  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 -will resume preventive regimen -patient tolerating meds and dysphagia 1 diet with nectar thick liquids.  Sarcoidosis -will continue continue chronic prednisone.  Failure to thrive and severe protein calorie malnutrition. -Multiple comorbidities with very guarded prognosis.   -Patient is DNR.   -appreciate palliative care service inputs and rec's -for now will treat what is treatable; but after long discussion with family, main goal is quality. -once stable, discharge back to SNF with hospice follow up.  Hypernatremia -Secondary to dehydration.   -improved with IVF's  hypokalemia -repleted -will follow electrolytes intermittently -Especially after stopping repletion on IV supplementation.  Anemia of chronic disease -Monitor Hgb trend -no signs of acute bleeding appreciated.  Code Status : DNR  Family Communication  : None at bedside  Disposition Plan  : Pending clinical course in the next 24 hours or so. Will stop antibiotics and IVF's.  Barriers For Discharge : Active symptoms  Consults  :  Palliative care  Procedures  : See below for x-ray reports.  DVT Prophylaxis  :   On Xarelto  Lab Results  Component Value Date   PLT 124 (L) 12/04/2017   Antibiotics  :    Anti-infectives (From admission, onward)   Start     Dose/Rate Route Frequency Ordered Stop   12/03/17 1700   vancomycin (VANCOCIN) IVPB 750 mg/150 ml premix  Status:  Discontinued     750 mg 150 mL/hr over 60 Minutes Intravenous Every 24 hours 12/02/17 1543 12/04/17 2016   12/03/17 0000  piperacillin-tazobactam (ZOSYN) IVPB 3.375 g  Status:  Discontinued     3.375 g 12.5 mL/hr over 240 Minutes Intravenous Every 8 hours 12/02/17 1518 12/02/17 1520   12/03/17 0000  piperacillin-tazobactam (ZOSYN) IVPB 3.375 g  Status:  Discontinued     3.375 g 12.5 mL/hr over 240 Minutes Intravenous Every 8 hours 12/02/17 1520 12/04/17 2016   12/02/17 1515  piperacillin-tazobactam (ZOSYN) IVPB 3.375 g     3.375 g 100 mL/hr over 30 Minutes Intravenous  Once 12/02/17 1513 12/02/17 1612   12/02/17 1515  vancomycin (VANCOCIN) IVPB 1000 mg/200 mL premix     1,000 mg 200 mL/hr over 60 Minutes Intravenous  Once 12/02/17 1513 12/02/17 1732       Objective:   Vitals:   12/03/17 2132 12/03/17 2133 12/04/17 0339 12/04/17 1306  BP: 118/64  (!) 124/55 (!) 134/46  Pulse: 73  66 68  Resp: (!) _0 Temp: 97.6 F (36.4 C)  (!) 97.5 F (36.4 C)   TempSrc: Oral  Oral   SpO2: 96% 97% 100% 100%  Weight:      Height:        Wt Readings from Last 3 Encounters:  12/03/17 48.3 kg (106 lb 7.7 oz)  11/26/17 50.8 kg (111 lb 15.9 oz)  01/16/16 54 kg (119 lb)    Intake/Output Summary (Last 24 hours) at 12/04/2017 2017 Last data filed at 12/04/2017 1800 Gross per 24 hour  Intake 1892.5 ml  Output -  Net 1892.5 ml    Physical Exam Gen: Afebrile, no chest pain, no shortness of breath.  Patient with poor insight, but able to follow simple commands.  Appears in no acute distress and very perked today after fluid resuscitation. HEENT: No thrush, no erythema in her pharynx, no tears or nodules discharges, nasal cannula in place.   Chest: Coarse breath sounds bilaterally, no wheezing, no crackles.  CVS: Rate controlled, irregular rhythm, no  murmurs, no rubs, no gallops.   GI: Soft, nondistended, nontender, positive bowel  sounds.   Muscular skeletal: Warm, no edema, no cyanosis.  CNS: Awake, following simple commands able to understand yes or no questions but overall with poor insight.  Cranial nerves grossly intact.  Patient with 2-3/5 muscle strength upper extremities and no ability to move her lower extremities.       Data Review:    CBC Recent Labs  Lab 12/02/17 1519  12/03/17 0004 12/03/17 0528 12/03/17 1222 12/03/17 2001 12/04/17 0525  WBC 10.8*  --   --  8.3  --   --  7.9  HGB 13.8   < > 11.3* 11.7* 11.4* 11.0* 11.8*  HCT 45.2   < > 38.2 39.1 38.3 37.2 39.4  PLT 206  --   --  146*  --   --  124*  MCV 101.3*  --   --  102.6*  --   --  104.2*  MCH 30.9  --   --  30.7  --   --  31.2  MCHC 30.5  --   --  29.9*  --   --  29.9*  RDW 13.6  --   --  13.4  --   --  13.1  LYMPHSABS 1.7  --   --   --   --   --   --   MONOABS 1.0  --   --   --   --   --   --   EOSABS 0.0  --   --   --   --   --   --   BASOSABS 0.0  --   --   --   --   --   --    < > = values in this interval not displayed.    Chemistries  Recent Labs  Lab 12/02/17 1519 12/02/17 2001 12/03/17 0528 12/04/17 0525  NA 148* 149* 146* 148*  K 3.8 3.7 3.4* 3.7  CL 111 112* 111 114*  CO2 22  --  27 27  GLUCOSE 198* 180* 146* 103*  BUN 50* 39* 37* 25*  CREATININE 0.91 0.60 0.65 0.60  CALCIUM 10.5*  --  9.3 8.9  AST 17  --   --   --   ALT 11*  --   --   --   ALKPHOS 58  --   --   --   BILITOT 0.8  --   --   --     Lab Results  Component Value Date   HGBA1C 6.0 (H) 11/25/2017   --------------------------------------------------------------------------------  Inpatient Medications  Scheduled Meds: . chlorhexidine  15 mL Mouth Rinse BID  . fluticasone  2 spray Each Nare Daily  . insulin aspart  0-9 Units Subcutaneous Q4H  . mouth rinse  15 mL Mouth Rinse q12n4p  . metoprolol tartrate  2.5 mg Intravenous Q6H   Continuous Infusions:  PRN Meds:.acetaminophen **OR** acetaminophen, ondansetron **OR** ondansetron  (ZOFRAN) IV  Micro Results Recent Results (from the past 240 hour(s))  Blood Culture (routine x 2)     Status: None (Preliminary result)   Collection Time: 12/02/17  3:20 PM  Result Value Ref Range Status   Specimen Description BLOOD LEFT WRIST  Final   Special Requests   Final    BOTTLES DRAWN AEROBIC AND ANAEROBIC Blood Culture adequate volume   Culture   Final    NO GROWTH 2 DAYS Performed at [facility], [place]  [address], [place], [place] [address]    Report Status PENDING  Incomplete  Blood Culture (routine x 2)     Status: None (Preliminary result)   Collection Time: 12/02/17  3:21 PM  Result Value Ref Range Status   Specimen Description LEFT ANTECUBITAL  Final   Special Requests   Final    BOTTLES DRAWN AEROBIC AND ANAEROBIC Blood Culture adequate volume   Culture   Final    NO GROWTH 2 DAYS Performed at [facility], [address], [place], [place] [address]    Report Status PENDING  Incomplete  Urine culture     Status: None   Collection Time: 12/02/17  3:40 PM  Result Value Ref Range Status   Specimen Description   Final    URINE, CATHETERIZED Performed at [facility], [address], [place], [place] [address]    Special Requests   Final    NONE Performed at [facility], [address], [place], [place] [address]    Culture   Final    NO GROWTH Performed at [facility], [place] [address] [place], [place] [address]    Report Status 12/04/2017 FINAL  Final  MRSA PCR Screening     Status: None   Collection Time: 12/02/17 11:38 PM  Result Value Ref Range Status   MRSA by PCR NEGATIVE NEGATIVE Final    Comment:        The GeneXpert MRSA Assay (FDA approved for NASAL specimens only), is one component of a comprehensive MRSA colonization surveillance program. It is not intended to diagnose MRSA infection nor to guide or monitor treatment for MRSA infections. Performed at [facility], [address], [place], [place] [address]    Radiology Reports Ct  Angio Head W/cm &/or Wo Cm  Result Date: 11/23/2017 CLINICAL DATA:  77 y/o  F; altered mental status of unclear cause. EXAM: CT ANGIOGRAPHY HEAD AND NECK CT PERFUSION BRAIN TECHNIQUE: Multidetector CT imaging of the head and neck was performed using the standard protocol during bolus administration of intravenous contrast. Multiplanar CT image reconstructions and MIPs were obtained to evaluate the vascular anatomy. Carotid stenosis measurements (when applicable) are obtained utilizing NASCET criteria, using the distal internal carotid diameter as the denominator. Multiphase CT imaging of the brain was performed following IV bolus contrast injection. Subsequent parametric perfusion maps were calculated using RAPID software. CONTRAST:  153m ISOVUE-370 IOPAMIDOL (ISOVUE-370) INJECTION 76% COMPARISON:  11/23/2017 CT head.  12/09/2004 MRI head. FINDINGS: CT HEAD FINDINGS Brain: Region of hypoattenuation within the right superior cerebellar hemisphere and extending into the right hemi pons compatible with late acute to subacute infarction (series 5, image 458and series 3, image 12). No associated hemorrhage or mass effect. No additional region of acute infarction, hemorrhage, or mass effect in the brain identified. Stable background of chronic microvascular ischemic changes and parenchymal volume loss of the supratentorial brain. Several small chronic lacunar infarcts within basal ganglia. Vascular: As below. Skull: Normal. Negative for fracture or focal lesion. Sinuses/Orbits: No acute finding. Other: None. ASPECTS ([place]Stroke Program Early CT Score) - Ganglionic level infarction (caudate, lentiform nuclei, internal capsule, insula, M1-M3 cortex): 7 - Supraganglionic infarction (M4-M6 cortex): 3 Total score (0-10 with 10 being normal): 10 Review of the MIP images confirms the above findings CTA NECK FINDINGS Aortic arch: Standard branching. Imaged portion shows no evidence of aneurysm or dissection. No significant  stenosis of the major arch vessel origins. Mild calcific atherosclerosis. Right carotid system: No evidence of dissection, stenosis (  50% or greater) or occlusion. Mild non stenotic calcific atherosclerosis of the carotid bifurcation. Left carotid system: No evidence of dissection, stenosis (50% or greater) or occlusion. Mild non stenotic calcific atherosclerosis of the carotid bifurcation. Vertebral arteries: Left dominant. No evidence of dissection, stenosis (50% or greater) or occlusion. Skeleton: C6-T1 severe erosive discogenic degenerative changes with endplate eburnation and grade 1 anterolisthesis at the C7-T1 level with there is severe facet arthritis. Bones are demineralized. Other neck: Multiple thyroid nodules measuring up to 9 mm on the left. Upper chest: Negative. Review of the MIP images confirms the above findings CTA HEAD FINDINGS Anterior circulation: No significant stenosis, proximal occlusion, aneurysm, or vascular malformation. The right ICA is larger than the left ICA likely due to variant anatomy where bilateral ACA are perfused by the right A1. Posterior circulation: Left P1 short segment of mild stenosis and right P1/P2 tandem segments of severe stenosis. Irregularity and mild stenosis of the left SCA origin. The right SCA attenuates in caliber early and appears occluded (series 12, image 120 and series 15, image 26). Venous sinuses: As permitted by contrast timing, patent. Anatomic variants: Anterior communicating artery and right posterior communicating artery. No left posterior communicating artery identified, likely hypoplastic or absent. Review of the MIP images confirms the above findings. CT Brain Perfusion Findings: CBF (<30%) Volume: 97m Perfusion (Tmax>6.0s) volume: 375mMismatch Volume: 2921mnfarction Location:Right hemi pons and superior cerebellar hemisphere infarction. The area of infarction on noncontrast CT of head is larger than CBF (<30%) Volume indicating  pseudonormalization of a region of the core infarction. IMPRESSION: CT head: 1. 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 No hemorrhage or associated mass effect. 2. Stable supratentorial chronic microvascular ischemic changes and parenchymal volume loss of the brain. CTA neck: 1. Patent carotid and vertebral arteries. No dissection, aneurysm, or hemodynamically significant stenosis utilizing NASCET criteria. 2. Severe cervical spondylosis at the C6-T1 levels. CTA head: 1. Right SCA occlusion. 2. Tandem segments of severe stenosis of right PCA. 3. Otherwise no large vessel occlusion, aneurysm, or significant stenosis is identified. CT brain perfusion: Perfusion anomaly in the right SCA distribution. The area of infarction on noncontrast CT of head is larger than CBF (<30%) Volume indicating pseudonormalization of a region of the core infarction. These results were called by telephone at the time of interpretation on 11/23/2017 at 9:03 pm to Dr. KATFrancine Gravenwho verbally acknowledged these results. Electronically Signed   By: LanKristine GarbeD.   On: 11/23/2017 21:07   Dg Chest 1 View  Result Date: 11/23/2017 CLINICAL DATA:  Altered mental status EXAM: CHEST  1 VIEW COMPARISON:  09/13/2014, 08/13/2014 FINDINGS: Mildly low lung volume. Small left pleural effusion with dense airspace disease at the lingula and left base. Cardiomegaly with aortic atherosclerosis. Minimal vascular congestion. No pneumothorax. IMPRESSION: 1. Mild left pleural effusion or thickening. Airspace disease at the lingula and left base, some of this may be chronic but difficult to exclude an acute infiltrate in the region 2. Cardiomegaly with mild vascular congestion Electronically Signed   By: KimDonavan FoilD.   On: 11/23/2017 18:29   Ct Head Wo Contrast  Result Date: 12/02/2017 CLINICAL DATA:  Altered level of consciousness.  Recent stroke. EXAM: CT HEAD WITHOUT CONTRAST  TECHNIQUE: Contiguous axial images were obtained from the base of the skull through the vertex without intravenous contrast. COMPARISON:  Head CT from 9 days ago. FINDINGS: Brain: Subacute infarct in the right superior cerebellum and right  posterior-lateral midbrain. No visible acute or interval infarct. No hemorrhage, hydrocephalus, or collection. Cerebral volume loss with ventriculomegaly. Mild chronic small vessel ischemia in the cerebral white matter. Vascular: Atherosclerotic calcification.  No hyperdense vessel. Skull: No acute or aggressive finding Sinuses/Orbits: Negative IMPRESSION: 1. Known subacute right superior cerebellar infarct. No hemorrhagic conversion. 2. No acute or interval abnormality. Electronically Signed   By: Monte Fantasia M.D.   On: 12/02/2017 16:38   Ct Head Wo Contrast  Result Date: 11/23/2017 CLINICAL DATA:  Lethargy following hydrocodone administration EXAM: CT HEAD WITHOUT CONTRAST CT CERVICAL SPINE WITHOUT CONTRAST TECHNIQUE: Multidetector CT imaging of the head and cervical spine was performed following the standard protocol without intravenous contrast. Multiplanar CT image reconstructions of the cervical spine were also generated. COMPARISON:  11/15/2007 FINDINGS: CT HEAD FINDINGS Brain: No evidence of acute infarction, hemorrhage, hydrocephalus, extra-axial collection or mass lesion/mass effect. Chronic atrophic and ischemic changes are noted. Vascular: No hyperdense vessel or unexpected calcification. Skull: Normal. Negative for fracture or focal lesion. Sinuses/Orbits: No acute finding. Other: None. CT CERVICAL SPINE FINDINGS Alignment: Alignment is within normal limits with the exception of degenerative anterolisthesis of C7 on T1. Skull base and vertebrae: 7 cervical segments are noted. Disc space narrowing is noted at C5-6, C6-7 and C7-T1. Significant sclerosis is noted at the C7-T1 interspace. Degenerative anterolisthesis is seen at C7-T1. Associated central canal  stenosis is noted at this level. No acute fracture or acute facet abnormality is noted. Facet hypertrophic changes are noted at multiple levels. Soft tissues and spinal canal: No prevertebral fluid or swelling. No visible canal hematoma. Upper chest: Within normal limits. Other: None IMPRESSION: CT of the head: Chronic atrophic and ischemic changes without acute abnormality. CT of the cervical spine: Multilevel degenerative change without acute abnormality Electronically Signed   By: Inez Catalina M.D.   On: 11/23/2017 18:33   Ct Angio Neck W And/or Wo Contrast  Result Date: 11/23/2017 CLINICAL DATA:  77 y/o  F; altered mental status of unclear cause. EXAM: CT ANGIOGRAPHY HEAD AND NECK CT PERFUSION BRAIN TECHNIQUE: Multidetector CT imaging of the head and neck was performed using the standard protocol during bolus administration of intravenous contrast. Multiplanar CT image reconstructions and MIPs were obtained to evaluate the vascular anatomy. Carotid stenosis measurements (when applicable) are obtained utilizing NASCET criteria, using the distal internal carotid diameter as the denominator. Multiphase CT imaging of the brain was performed following IV bolus contrast injection. Subsequent parametric perfusion maps were calculated using RAPID software. CONTRAST:  172m ISOVUE-370 IOPAMIDOL (ISOVUE-370) INJECTION 76% COMPARISON:  11/23/2017 CT head.  12/09/2004 MRI head. FINDINGS: CT HEAD FINDINGS Brain: Region of hypoattenuation within the right superior cerebellar hemisphere and extending into the right hemi pons compatible with late acute to subacute infarction (series 5, image 41and series 3, image 12). No associated hemorrhage or mass effect. No additional region of acute infarction, hemorrhage, or mass effect in the brain identified. Stable background of chronic microvascular ischemic changes and parenchymal volume loss of the supratentorial brain. Several small chronic lacunar infarcts within basal  ganglia. Vascular: As below. Skull: Normal. Negative for fracture or focal lesion. Sinuses/Orbits: No acute finding. Other: None. ASPECTS (AEast IthacaStroke Program Early CT Score) - Ganglionic level infarction (caudate, lentiform nuclei, internal capsule, insula, M1-M3 cortex): 7 - Supraganglionic infarction (M4-M6 cortex): 3 Total score (0-10 with 10 being normal): 10 Review of the MIP images confirms the above findings CTA NECK FINDINGS Aortic arch: Standard branching. Imaged portion shows no evidence of  aneurysm or dissection. No significant stenosis of the major arch vessel origins. Mild calcific atherosclerosis. Right carotid system: No evidence of dissection, stenosis (50% or greater) or occlusion. Mild non stenotic calcific atherosclerosis of the carotid bifurcation. Left carotid system: No evidence of dissection, stenosis (50% or greater) or occlusion. Mild non stenotic calcific atherosclerosis of the carotid bifurcation. Vertebral arteries: Left dominant. No evidence of dissection, stenosis (50% or greater) or occlusion. Skeleton: C6-T1 severe erosive discogenic degenerative changes with endplate eburnation and grade 1 anterolisthesis at the C7-T1 level with there is severe facet arthritis. Bones are demineralized. Other neck: Multiple thyroid nodules measuring up to 9 mm on the left. Upper chest: Negative. Review of the MIP images confirms the above findings CTA HEAD FINDINGS Anterior circulation: No significant stenosis, proximal occlusion, aneurysm, or vascular malformation. The right ICA is larger than the left ICA likely due to variant anatomy where bilateral ACA are perfused by the right A1. Posterior circulation: Left P1 short segment of mild stenosis and right P1/P2 tandem segments of severe stenosis. Irregularity and mild stenosis of the left SCA origin. The right SCA attenuates in caliber early and appears occluded (series 12, image 120 and series 15, image 26). Venous sinuses: As permitted by  contrast timing, patent. Anatomic variants: Anterior communicating artery and right posterior communicating artery. No left posterior communicating artery identified, likely hypoplastic or absent. Review of the MIP images confirms the above findings. CT Brain Perfusion Findings: CBF (<30%) Volume: 74m Perfusion (Tmax>6.0s) volume: 329mMismatch Volume: 2969mnfarction Location:Right hemi pons and superior cerebellar hemisphere infarction. The area of infarction on noncontrast CT of head is larger than CBF (<30%) Volume indicating pseudonormalization of a region of the core infarction. IMPRESSION: CT head: 1. 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 No hemorrhage or associated mass effect. 2. Stable supratentorial chronic microvascular ischemic changes and parenchymal volume loss of the brain. CTA neck: 1. Patent carotid and vertebral arteries. No dissection, aneurysm, or hemodynamically significant stenosis utilizing NASCET criteria. 2. Severe cervical spondylosis at the C6-T1 levels. CTA head: 1. Right SCA occlusion. 2. Tandem segments of severe stenosis of right PCA. 3. Otherwise no large vessel occlusion, aneurysm, or significant stenosis is identified. CT brain perfusion: Perfusion anomaly in the right SCA distribution. The area of infarction on noncontrast CT of head is larger than CBF (<30%) Volume indicating pseudonormalization of a region of the core infarction. These results were called by telephone at the time of interpretation on 11/23/2017 at 9:03 pm to Dr. KATFrancine Gravenwho verbally acknowledged these results. Electronically Signed   By: LanKristine GarbeD.   On: 11/23/2017 21:07   Ct Cervical Spine Wo Contrast  Result Date: 11/23/2017 CLINICAL DATA:  Lethargy following hydrocodone administration EXAM: CT HEAD WITHOUT CONTRAST CT CERVICAL SPINE WITHOUT CONTRAST TECHNIQUE: Multidetector CT imaging of the head and cervical spine was performed  following the standard protocol without intravenous contrast. Multiplanar CT image reconstructions of the cervical spine were also generated. COMPARISON:  11/15/2007 FINDINGS: CT HEAD FINDINGS Brain: No evidence of acute infarction, hemorrhage, hydrocephalus, extra-axial collection or mass lesion/mass effect. Chronic atrophic and ischemic changes are noted. Vascular: No hyperdense vessel or unexpected calcification. Skull: Normal. Negative for fracture or focal lesion. Sinuses/Orbits: No acute finding. Other: None. CT CERVICAL SPINE FINDINGS Alignment: Alignment is within normal limits with the exception of degenerative anterolisthesis of C7 on T1. Skull base and vertebrae: 7 cervical segments are noted. Disc space narrowing is noted at C5-6, C6-7  and C7-T1. Significant sclerosis is noted at the C7-T1 interspace. Degenerative anterolisthesis is seen at C7-T1. Associated central canal stenosis is noted at this level. No acute fracture or acute facet abnormality is noted. Facet hypertrophic changes are noted at multiple levels. Soft tissues and spinal canal: No prevertebral fluid or swelling. No visible canal hematoma. Upper chest: Within normal limits. Other: None IMPRESSION: CT of the head: Chronic atrophic and ischemic changes without acute abnormality. CT of the cervical spine: Multilevel degenerative change without acute abnormality Electronically Signed   By: Inez Catalina M.D.   On: 11/23/2017 18:33   Mr Brain Wo Contrast  Result Date: 11/24/2017 CLINICAL DATA:  Altered mental status EXAM: MRI HEAD WITHOUT CONTRAST TECHNIQUE: Multiplanar, multiecho pulse sequences of the brain and surrounding structures were obtained without intravenous contrast. COMPARISON:  Head CT 11/23/2017 FINDINGS: BRAIN: The midline structures are normal. There is abnormal diffusion restriction within the dorsolateral right pons and superior right cerebellum, within the right superior cerebellar artery territory, as demonstrated on  the earlier CT. No mass lesion, hydrocephalus, dural abnormality or extra-axial collection. Early confluent hyperintense T2-weighted signal of the periventricular and deep white matter, most commonly due to chronic ischemic microangiopathy. No age-advanced or lobar predominant atrophy. There are a few central chronic microhemorrhages. VASCULAR: Major intracranial arterial and venous sinus flow voids are preserved. SKULL AND UPPER CERVICAL SPINE: The visualized skull base, calvarium, upper cervical spine and extracranial soft tissues are normal. SINUSES/ORBITS: No fluid levels or advanced mucosal thickening. No mastoid or middle ear effusion. Normal orbits. IMPRESSION: 1. 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 2. No hemorrhage or mass effect. 3. Chronic ischemic microangiopathy. Electronically Signed   By: Ulyses Jarred M.D.   On: 11/24/2017 06:03   Ct Cerebral Perfusion W Contrast  Result Date: 11/23/2017 CLINICAL DATA:  77 y/o  F; altered mental status of unclear cause. EXAM: CT ANGIOGRAPHY HEAD AND NECK CT PERFUSION BRAIN TECHNIQUE: Multidetector CT imaging of the head and neck was performed using the standard protocol during bolus administration of intravenous contrast. Multiplanar CT image reconstructions and MIPs were obtained to evaluate the vascular anatomy. Carotid stenosis measurements (when applicable) are obtained utilizing NASCET criteria, using the distal internal carotid diameter as the denominator. Multiphase CT imaging of the brain was performed following IV bolus contrast injection. Subsequent parametric perfusion maps were calculated using RAPID software. CONTRAST:  17m ISOVUE-370 IOPAMIDOL (ISOVUE-370) INJECTION 76% COMPARISON:  11/23/2017 CT head.  12/09/2004 MRI head. FINDINGS: CT HEAD FINDINGS Brain: Region of hypoattenuation within the right superior cerebellar hemisphere and extending into the right hemi  pons compatible with late acute to subacute infarction (series 5, image 438and series 3, image 12). No associated hemorrhage or mass effect. No additional region of acute infarction, hemorrhage, or mass effect in the brain identified. Stable background of chronic microvascular ischemic changes and parenchymal volume loss of the supratentorial brain. Several small chronic lacunar infarcts within basal ganglia. Vascular: As below. Skull: Normal. Negative for fracture or focal lesion. Sinuses/Orbits: No acute finding. Other: None. ASPECTS ([place]Stroke Program Early CT Score) - Ganglionic level infarction (caudate, lentiform nuclei, internal capsule, insula, M1-M3 cortex): 7 - Supraganglionic infarction (M4-M6 cortex): 3 Total score (0-10 with 10 being normal): 10 Review of the MIP images confirms the above findings CTA NECK FINDINGS Aortic arch: Standard branching. Imaged portion shows no evidence of aneurysm or dissection. No significant stenosis of the major arch vessel origins.  Mild calcific atherosclerosis. Right carotid system: No evidence of dissection, stenosis (50% or greater) or occlusion. Mild non stenotic calcific atherosclerosis of the carotid bifurcation. Left carotid system: No evidence of dissection, stenosis (50% or greater) or occlusion. Mild non stenotic calcific atherosclerosis of the carotid bifurcation. Vertebral arteries: Left dominant. No evidence of dissection, stenosis (50% or greater) or occlusion. Skeleton: C6-T1 severe erosive discogenic degenerative changes with endplate eburnation and grade 1 anterolisthesis at the C7-T1 level with there is severe facet arthritis. Bones are demineralized. Other neck: Multiple thyroid nodules measuring up to 9 mm on the left. Upper chest: Negative. Review of the MIP images confirms the above findings CTA HEAD FINDINGS Anterior circulation: No significant stenosis, proximal occlusion, aneurysm, or vascular malformation. The right ICA is larger than the  left ICA likely due to variant anatomy where bilateral ACA are perfused by the right A1. Posterior circulation: Left P1 short segment of mild stenosis and right P1/P2 tandem segments of severe stenosis. Irregularity and mild stenosis of the left SCA origin. The right SCA attenuates in caliber early and appears occluded (series 12, image 120 and series 15, image 26). Venous sinuses: As permitted by contrast timing, patent. Anatomic variants: Anterior communicating artery and right posterior communicating artery. No left posterior communicating artery identified, likely hypoplastic or absent. Review of the MIP images confirms the above findings. CT Brain Perfusion Findings: CBF (<30%) Volume: 50m Perfusion (Tmax>6.0s) volume: 373mMismatch Volume: 2958mnfarction Location:Right hemi pons and superior cerebellar hemisphere infarction. The area of infarction on noncontrast CT of head is larger than CBF (<30%) Volume indicating pseudonormalization of a region of the core infarction. IMPRESSION: CT head: 1. 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 No hemorrhage or associated mass effect. 2. Stable supratentorial chronic microvascular ischemic changes and parenchymal volume loss of the brain. CTA neck: 1. Patent carotid and vertebral arteries. No dissection, aneurysm, or hemodynamically significant stenosis utilizing NASCET criteria. 2. Severe cervical spondylosis at the C6-T1 levels. CTA head: 1. Right SCA occlusion. 2. Tandem segments of severe stenosis of right PCA. 3. Otherwise no large vessel occlusion, aneurysm, or significant stenosis is identified. CT brain perfusion: Perfusion anomaly in the right SCA distribution. The area of infarction on noncontrast CT of head is larger than CBF (<30%) Volume indicating pseudonormalization of a region of the core infarction. These results were called by telephone at the time of interpretation on 11/23/2017 at 9:03 pm to Dr. KATFrancine Gravenwho verbally acknowledged these results. Electronically Signed   By: LanKristine GarbeD.   On: 11/23/2017 21:07   Dg Chest Portable 1 View  Result Date: 12/02/2017 CLINICAL DATA:  Unresponsive, recent brainstem stroke, follow-up possible pneumonia on prior chest radiograph EXAM: PORTABLE CHEST 1 VIEW COMPARISON:  11/23/2017 FINDINGS: Lingular opacity, unchanged, favoring atelectasis/scarring although pneumonia is not excluded. Left lung base is obscured, although this favors overlying soft tissue, as this appearance is chronic dating back to 2016. No frank interstitial edema.  Right lung is clear.  No pneumothorax. The heart is normal in size. IMPRESSION: Lingular opacity, unchanged, possibly scarring, pneumonia not excluded. Electronically Signed   By: SriJulian HyD.   On: 12/02/2017 17:05   Dg Chest Port 1v Same Day  Result Date: 12/02/2017 CLINICAL DATA:  Aspiration. EXAM: PORTABLE CHEST 1 VIEW COMPARISON:  December 02, 2017 FINDINGS: A tubular density projected over the right upper to mid lung was not seen earlier today. Left retrocardiac opacity remains unchanged. The heart, hila,  mediastinum, lungs, and pleura are otherwise normal. IMPRESSION: A tubular/thickened platelike opacity in the right upper lung likely represents either something on the patient or atelectasis. This does not have the typical appearance of an infiltrate or aspiration. This was not seen earlier today. Persistent opacity in the left base, unchanged. No other changes. Electronically Signed   By: Dorise Bullion III M.D   On: 12/02/2017 22:14   Dg Swallowing Func-speech Pathology  Result Date: 11/25/2017 Objective Swallowing Evaluation: Type of Study: MBS-Modified Barium Swallow Study  Patient Details Name: JUNA CABAN MRN: [id number] Date of Birth: 09-Jul-1941 Today's Date: 11/25/2017 Time: SLP Start Time (ACUTE ONLY): 1046 -SLP Stop Time (ACUTE ONLY): 1059 SLP Time Calculation (min) (ACUTE ONLY): 13 min  Past Medical History: Past Medical History: Diagnosis Date . Arthritis  . Atrial fibrillation ([place])  . Atrial fibrillation with RVR ([place]) 09/13/2014 . Chronic pain  . Diabetes mellitus without complication ([place])  . DVT (deep venous thrombosis) ([place])  . Hiatal hernia  . Hypertension  . Osteoporosis  . Pulmonary emboli ([place])  . Sarcoidosis  . Vertigo  . Wheelchair bound  Past Surgical History: Past Surgical History: Procedure Laterality Date . ABDOMINAL HYSTERECTOMY   . I&D EXTREMITY Left 09/13/2014  Procedure: IRRIGATION AND DEBRIDEMENT EXTREMITY;  Surgeon: Linna Hoff, MD;  Location: [place];  Service: Orthopedics;  Laterality: Left; . INTRAMEDULLARY (IM) NAIL INTERTROCHANTERIC Left 06/27/2015  Procedure: INTRAMEDULLARY (IM) NAIL INTERTROCHANTRIC AFFIXIS;  Surgeon: Marybelle Killings, MD;  Location: [facility];  Service: Orthopedics;  Laterality: Left; . JOINT REPLACEMENT    toe . LUNG LOBECTOMY Left   Lower HPI: 78 year old female with medical history significant for type 2 diabetes mellitus, hypertension, chronic pain on opiate analgesia, pulmonary embolism, sarcoidosis initially presented to the ED poorly responsive in setting of questionable opiod overdose, found to have neuro change. MRI acute infarct of the superior right cerebellum and right dorso lateral pons, in the superior cerebellar artery territory. CXR mild left pleural effusion or thickening. Airspace disease at the lingula and left base, some of this may be chronic but difficult to exclude an acute infiltrate in the region, cardiomegaly with mild vascular congestion.  No data recorded Assessment / Plan / Recommendation CHL IP CLINICAL IMPRESSIONS 11/25/2017 Clinical Impression Moderate oral dysphagia marked by anterior spill, decreased control and delayed oral transit. Pharyngeal phase characterized by sensory impairments with late swallow initiation at the pyriform sinuses with thinner consistencies and valleculae with honey thick. Laryngeal penetration to vocal  cords consistently with thin and nectar barium without sensation and ineffective attempts at cued throat clear/cough. No appreciable post swallow residue. Positioning somewhat challenging due to kyphosis. Recommend honey thick liquids, Dys 2 texture, crush pills and full assist and supervision with ST for safety and education.   SLP Visit Diagnosis Dysphagia, oropharyngeal phase (R13.12) Attention and concentration deficit following -- Frontal lobe and executive function deficit following -- Impact on safety and function Moderate aspiration risk   CHL IP TREATMENT RECOMMENDATION 11/25/2017 Treatment Recommendations Therapy as outlined in treatment plan below   Prognosis 11/25/2017 Prognosis for Safe Diet Advancement (No Data) Barriers to Reach Goals Cognitive deficits Barriers/Prognosis Comment -- CHL IP DIET RECOMMENDATION 11/25/2017 SLP Diet Recommendations Dysphagia 2 (Fine chop) solids;Honey thick liquids Liquid Administration via Cup Medication Administration Crushed with puree Compensations Slow rate;Small sips/bites;Lingual sweep for clearance of pocketing Postural Changes Seated upright at 90 degrees   CHL IP OTHER RECOMMENDATIONS 11/25/2017 Recommended Consults -- Oral Care Recommendations Oral care BID Other Recommendations --  CHL IP FOLLOW UP RECOMMENDATIONS 11/25/2017 Follow up Recommendations Other (comment)   CHL IP FREQUENCY AND DURATION 11/25/2017 Speech Therapy Frequency (ACUTE ONLY) min 2x/week Treatment Duration 2 weeks      CHL IP ORAL PHASE 11/25/2017 Oral Phase Impaired Oral - Pudding Teaspoon -- Oral - Pudding Cup -- Oral - Honey Teaspoon -- Oral - Honey Cup Decreased bolus cohesion;Delayed oral transit;Weak lingual manipulation;Left anterior bolus loss Oral - Nectar Teaspoon -- Oral - Nectar Cup Weak lingual manipulation;Delayed oral transit;Decreased bolus cohesion;Left anterior bolus loss Oral - Nectar Straw -- Oral - Thin Teaspoon -- Oral - Thin Cup -- Oral - Thin Straw WFL Oral - Puree Delayed  oral transit Oral - Mech Soft -- Oral - Regular Weak lingual manipulation;Delayed oral transit Oral - Multi-Consistency -- Oral - Pill -- Oral Phase - Comment --  CHL IP PHARYNGEAL PHASE 11/25/2017 Pharyngeal Phase Impaired Pharyngeal- Pudding Teaspoon -- Pharyngeal -- Pharyngeal- Pudding Cup -- Pharyngeal -- Pharyngeal- Honey Teaspoon -- Pharyngeal -- Pharyngeal- Honey Cup -- Pharyngeal -- Pharyngeal- Nectar Teaspoon -- Pharyngeal -- Pharyngeal- Nectar Cup Delayed swallow initiation-pyriform sinuses;Penetration/Aspiration during swallow Pharyngeal Material enters airway, CONTACTS cords and not ejected out Pharyngeal- Nectar Straw -- Pharyngeal -- Pharyngeal- Thin Teaspoon -- Pharyngeal -- Pharyngeal- Thin Cup -- Pharyngeal -- Pharyngeal- Thin Straw Delayed swallow initiation-pyriform sinuses;Penetration/Aspiration during swallow Pharyngeal Material enters airway, CONTACTS cords and not ejected out Pharyngeal- Puree Penetration/Aspiration during swallow Pharyngeal Material enters airway, remains ABOVE vocal cords and not ejected out Pharyngeal- Mechanical Soft -- Pharyngeal -- Pharyngeal- Regular Delayed swallow initiation-vallecula Pharyngeal -- Pharyngeal- Multi-consistency -- Pharyngeal -- Pharyngeal- Pill -- Pharyngeal -- Pharyngeal Comment --  CHL IP CERVICAL ESOPHAGEAL PHASE 11/25/2017 Cervical Esophageal Phase WFL Pudding Teaspoon -- Pudding Cup -- Honey Teaspoon -- Honey Cup -- Nectar Teaspoon -- Nectar Cup -- Nectar Straw -- Thin Teaspoon -- Thin Cup -- Thin Straw -- Puree -- Mechanical Soft -- Regular -- Multi-consistency -- Pill -- Cervical Esophageal Comment -- No flowsheet data found. Houston Siren 11/25/2017, 11:56 AM Orbie Pyo Colvin Caroli.Ed CCC-SLP Pager [phone]               Time Spent:  25 minutes   Barton Dubois M.D on 12/04/2017 at 8:17 PM  Between 7am to 7pm - Pager - [phone]  After 7pm go to [email or web address] - password [facility]  [facility] -  Office  [phone]

## 2017-12-04 NOTE — Care Management Important Message (Signed)
Important Message  Patient Details  Name: Elizabeth Drake MRN: [id number] Date of Birth: 1940-08-18   Medicare Important Message Given:  Yes    [name] 12/04/2017, 11:04 AM

## 2017-12-04 NOTE — Progress Notes (Signed)
Palliative: Mrs. Manzi is sitting up in the bed being fed by nursing staff.  Her eyes are open, she briefly makes but does not keep eye contact.  She does not try to communicate in any meaningful way.  Present at bedside today is her husband Lake Bells, son Shirlee Limerick and her husband, and a female cousin.  We talked about Mrs. Struble's improvements with IV fluids and antibiotics.  We talked about her nutritional intake, pured diet and thickened liquids.  We talked about concern over continued aspiration, recurrent dehydration.  We talked about the [employer].  We also talk about Mrs. Lizardo's functional status prior to her stroke approximately 1 week ago.  Hospitalist, Dr. Dyann Kief arrives during our conversation.  He discussed plan of care in detail with family.  We plan for a family meeting tomorrow 5/2 around 1130.  Sister Izora Gala will be at bedside, Lake Bells and Lanny Hurst have a doctor's appointment.  Call to son/HC POA, Thanvi Blincoe at [phone].  General voicemail message left.  57 minutes  Quinn Axe, NP Palliative Medicine Team Team Phone # [phone]

## 2017-12-05 DIAGNOSIS — R0602 Shortness of breath: Secondary | ICD-10-CM

## 2017-12-05 LAB — GLUCOSE, CAPILLARY
GLUCOSE-CAPILLARY: 134 mg/dL — AB (ref 65–99)
GLUCOSE-CAPILLARY: 192 mg/dL — AB (ref 65–99)
GLUCOSE-CAPILLARY: 218 mg/dL — AB (ref 65–99)
GLUCOSE-CAPILLARY: 94 mg/dL (ref 65–99)
GLUCOSE-CAPILLARY: 96 mg/dL (ref 65–99)
Glucose-Capillary: 148 mg/dL — ABNORMAL HIGH (ref 65–99)

## 2017-12-05 NOTE — Progress Notes (Signed)
Palliative: Mrs. Bessler is resting quietly in bed.  She briefly makes but does not keep eye contact.  When she does speak, it is in a whisper.  Present today at bedside is her sister Izora Gala.  We talked about Mrs. Catanese chronic health concerns, her very poor functional status prior to her stroke.  We talked about what is normal and expected, changes that occur for people who look like [name].  I share a diagram of the chronic illness pathway. Izora Gala shares that her sister has told her husband Lake Bells Laverna Peace), "I wish I could go ahead and die".  We talked about respect for Mrs. Cerino, what we can and cannot change.  I share that at this point, the plan is to let Joyces body decide if she is able to take in enough fluid and nutrition.  Izora Gala states that her sister has not been eating prior to her stroke.  Her weight at Christmas was around 130 pounds, last week it was 108. Psychosocial support provided, all questions answered.  26 minutes Quinn Axe, NP Palliative Medicine Team Team Phone # [phone]

## 2017-12-05 NOTE — NC FL2 (Signed)
[place] LEVEL OF CARE SCREENING TOOL     IDENTIFICATION  Patient Name: Elizabeth Drake Birthdate: 10-Jun-1941 Sex: female Admission Date (Current Location): 12/02/2017  [facility] and [place] Number:  [employer] and Address:  [place] [address], [place]      Provider Number: [id number]  Attending Physician Name and Address:  Barton Dubois, MD  Relative Name and Phone Number:       Current Level of Care: Hospital Recommended Level of Care: [place] Prior Approval Number:    Date Approved/Denied:   PASRR Number: [id number]  Discharge Plan: Home    Current Diagnoses: Patient Active Problem List   Diagnosis Date Noted  . Dehydration   . Encephalopathy 12/03/2017  . Palliative care by specialist   . Goals of care, counseling/discussion   . DNR (do not resuscitate) discussion   . CVA, old, dysphagia 12/02/2017  . Pneumonia 12/02/2017  . Encephalopathy acute 12/02/2017  . Cerebral embolism with cerebral infarction 11/24/2017  . Pressure injury of skin 11/24/2017  . Altered mental status   . Paroxysmal atrial fibrillation (HCC)   . Obtundation 11/23/2017  . Consolidation of left lower lobe of lung ([place]) 11/23/2017  . Encounter for long-term use of opiate analgesic 06/12/2016  . Pain management contract agreement 06/12/2016  . Protein-calorie malnutrition, severe 06/28/2015  . Closed left hip fracture ([place]) 06/25/2015  . Hypertension 10/12/2013  . Physical deconditioning 06/30/2013  . History of pulmonary embolus (PE) 06/24/2013  . Diabetes ([place]) 04/25/2013  . OAB (overactive bladder) 04/25/2013  . Wheelchair bound 04/25/2013  . Urinary tract infection without hematuria 04/25/2013  . Osteoporosis 04/25/2013  . Chronic pain syndrome (LEGS/BACK) 04/25/2013  . OA (osteoarthritis) 04/25/2013  . Hyperlipidemia 12/31/2012  . 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 12/31/2012    Orientation RESPIRATION  BLADDER Height & Weight     Self  O2(4L) Incontinent Weight: 106 lb 7.7 oz (48.3 kg) Height:  5' 5.98" (167.6 cm)  BEHAVIORAL SYMPTOMS/MOOD NEUROLOGICAL BOWEL NUTRITION STATUS      Continent Diet(DYS 1. Honey thick, magic cup)  AMBULATORY STATUS COMMUNICATION OF NEEDS Skin   Extensive Assist Verbally PU Stage and Appropriate Care(sacrum, mid)                       Personal Care Assistance Level of Assistance  Bathing, Feeding, Dressing Bathing Assistance: Maximum assistance Feeding assistance: Limited assistance Dressing Assistance: Maximum assistance     Functional Limitations Info  Sight, Hearing, Speech Sight Info: Adequate Hearing Info: Adequate Speech Info: Adequate(delayed responses)    SPECIAL CARE FACTORS FREQUENCY                       Contractures Contractures Info: Not present    Additional Factors Info  Code Status, Allergies Code Status Info: DNR Allergies Info: Advil, Propoxyphene, Azo Phenazopyridine, Indocin Indomethacin, Keflex Cephalexin, Loratadine, Percocet Oxycodone-acetaminophen           Current Medications (12/05/2017):  This is the current hospital active medication list Current Facility-Administered Medications  Medication Dose Route Frequency Provider Last Rate Last Dose  . acetaminophen (TYLENOL) tablet 650 mg  650 mg Oral Q6H PRN Manuella Ghazi, Pratik D, DO       Or  . acetaminophen (TYLENOL) suppository 650 mg  650 mg Rectal Q6H PRN Manuella Ghazi, Pratik D, DO      . chlorhexidine (PERIDEX) 0.12 % solution 15 mL  15 mL Mouth Rinse  BID Dhungel, Nishant, MD   15 mL at 12/05/17 0952  . fluticasone (FLONASE) 50 MCG/ACT nasal spray 2 spray  2 spray Each Nare Daily Manuella Ghazi, Pratik D, DO   2 spray at 12/05/17 [phone]  . insulin aspart (novoLOG) injection 0-9 Units  0-9 Units Subcutaneous Q4H Shah, Pratik D, DO   2 Units at 12/05/17 0019  . MEDLINE mouth rinse  15 mL Mouth Rinse q12n4p Dhungel, Nishant, MD   15 mL at 12/04/17 1654  . metoprolol tartrate  (LOPRESSOR) injection 2.5 mg  2.5 mg Intravenous Q6H Shah, Pratik D, DO   2.5 mg at 12/05/17 0020  . ondansetron (ZOFRAN) tablet 4 mg  4 mg Oral Q6H PRN Manuella Ghazi, Pratik D, DO       Or  . ondansetron (ZOFRAN) injection 4 mg  4 mg Intravenous Q6H PRN Heath Lark D, DO         Discharge Medications: Please see discharge summary for a list of discharge medications.  Relevant Imaging Results:  Relevant Lab Results:   Additional Information SSN: [id number]  Ihor Gully, LCSW

## 2017-12-05 NOTE — Progress Notes (Signed)
PROGRESS NOTE                                                                                                                                                                                                             Patient Demographics:    Elizabeth Drake, is a 77 y.o. female, DOB - 16-Jun-1941, WLS:[id number]  Admit date - 12/02/2017   Admitting Physician Pratik Darleen Crocker, DO  Outpatient Primary MD for the patient is Chevis Pretty, [place]  LOS - 2  Outpatient Specialists: None  Chief Complaint  Patient presents with  . 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 coli UTI who was discharged to SNF for rehab on 4/23, A. fib , PE on Xarelto, sarcoidosis on chronic prednisone, diabetes mellitus, uncontrolled blood pressure hypertension, chronic pain, arthritis, deconditioning with wheelchair-bound status for almost 2 years was sent from SNF with acute encephalopathy.  In the ED with she was septic with fever of 101.4 F, WC of 10 point 8K, sodium of 149 and lactic acid of 1.73. Head CT was unremarkable for acute findings, UA and chest x-ray negative for acute infection. Patient met criteria for sepsis and was given IV Ringer lactate bolus (2 L) and empiric vancomycin and Zosyn and admitted to hospitalist service.  Subjective:   Oriented x2, no acute distress, no chest pain, no shortness of breath, no nausea vomiting.  Patient tolerating dysphagia 1 diet with honey thick liquids.   Assessment  & Plan :   Principal problem Acute toxic metabolic encephalopathy -Likely multifactorial, but most likely metabolic (with presentation of hypernatremia) -no fever and WBC's essentially back to normal. -will stop antibiotics and observed clinical response. -diet has been advanced  -follow for any signs of infection.  -patient  denies dysuria.   Dysphagia with poor p.o. intake -appreciate rec's from [facility] -dysphagia 1 with nectar thick liquids started.  He  Paroxysmal A. fib Rate controlled -continue metoprolol -xarelto on hold for now -continue monitoring on telmetry  Essential hypertension -Continue metoprolol. -BP stable.  Diabetes mellitus -continue holding oral agents -continue SSI  Chronic pain syndrome -will be judicious with pain meds, while evaluating improvement on her mentation.   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 -will  resume preventive regimen -patient tolerating meds and dysphagia 1 diet with nectar thick liquids.  Sarcoidosis -will continue continue chronic prednisone.  Failure to thrive and severe protein calorie malnutrition. -Multiple comorbidities with very guarded prognosis.   -Patient is DNR.   -appreciate palliative care service inputs and rec's -for now will treat what is treatable; but after long discussion with family, main goal is quality. -once stable, discharge back to SNF with hospice follow up.  Hypernatremia -Secondary to dehydration.   -improved with IVF's  hypokalemia -repleted -will follow electrolytes intermittently -Especially after stopping repletion on IV supplementation.  Anemia of chronic disease -Monitor Hgb trend -no signs of acute bleeding appreciated.  Code Status : DNR  Family Communication  : None at bedside  Disposition Plan  : Pending clinical course in the next 24 hours. Will observe and repeat blood work in am. Hopefully discharge to SNF tomorrow.   Barriers For Discharge : Active symptoms  Consults  :  Palliative care  Procedures  : See below for x-ray reports.  DVT Prophylaxis  :   On Xarelto  Lab Results  Component Value Date   PLT 124 (L) 12/04/2017   Antibiotics  :    Anti-infectives (From admission, onward)   Start     Dose/Rate Route Frequency Ordered Stop   12/03/17 1700  vancomycin  (VANCOCIN) IVPB 750 mg/150 ml premix  Status:  Discontinued     750 mg 150 mL/hr over 60 Minutes Intravenous Every 24 hours 12/02/17 1543 12/04/17 2016   12/03/17 0000  piperacillin-tazobactam (ZOSYN) IVPB 3.375 g  Status:  Discontinued     3.375 g 12.5 mL/hr over 240 Minutes Intravenous Every 8 hours 12/02/17 1518 12/02/17 1520   12/03/17 0000  piperacillin-tazobactam (ZOSYN) IVPB 3.375 g  Status:  Discontinued     3.375 g 12.5 mL/hr over 240 Minutes Intravenous Every 8 hours 12/02/17 1520 12/04/17 2016   12/02/17 1515  piperacillin-tazobactam (ZOSYN) IVPB 3.375 g     3.375 g 100 mL/hr over 30 Minutes Intravenous  Once 12/02/17 1513 12/02/17 1612   12/02/17 1515  vancomycin (VANCOCIN) IVPB 1000 mg/200 mL premix     1,000 mg 200 mL/hr over 60 Minutes Intravenous  Once 12/02/17 1513 12/02/17 1732       Objective:   Vitals:   12/05/17 0514 12/05/17 0556 12/05/17 1158 12/05/17 1305  BP: (!) 147/62  (!) 153/70 (!) 136/50  Pulse: (!) 57 (!) 52 73 72  Resp:    18  Temp: 97.9 F (36.6 C)   98.2 F (36.8 C)  TempSrc: Axillary   Oral  SpO2: 99%   100%  Weight:      Height:        Wt Readings from Last 3 Encounters:  12/03/17 48.3 kg (106 lb 7.7 oz)  11/26/17 50.8 kg (111 lb 15.9 oz)  01/16/16 54 kg (119 lb)    Intake/Output Summary (Last 24 hours) at 12/05/2017 1705 Last data filed at 12/05/2017 1253 Gross per 24 hour  Intake 1160 ml  Output 975 ml  Net 185 ml    Physical Exam Gen: No fever, no chest pain, no shortness of breath.  Patient is stable in no acute distress.  Oriented x1 unable to follow simple commands.  Patient continue to eat dysphagia 1 with honey thick liquids. HEENT: No thrush, no erythema in oropharynx, no ears or nostrils discharges appreciated.  Nasal cannula in place.    Chest: Good air movement, no wheezing, no crackles.  CVS: Rate controlled, no murmurs, no rubs, no gallops.  GI: Soft, non-tenderness, no distention, positive bowel sounds.  Muscular  skeletal: no edema, no cyanosis.  CNS: awake, following commands, oriented X2, right upper extremities with 2/5 and LUE 3/5.     Data Review:    CBC Recent Labs  Lab 12/02/17 1519  12/03/17 0004 12/03/17 0528 12/03/17 1222 12/03/17 2001 12/04/17 0525  WBC 10.8*  --   --  8.3  --   --  7.9  HGB 13.8   < > 11.3* 11.7* 11.4* 11.0* 11.8*  HCT 45.2   < > 38.2 39.1 38.3 37.2 39.4  PLT 206  --   --  146*  --   --  124*  MCV 101.3*  --   --  102.6*  --   --  104.2*  MCH 30.9  --   --  30.7  --   --  31.2  MCHC 30.5  --   --  29.9*  --   --  29.9*  RDW 13.6  --   --  13.4  --   --  13.1  LYMPHSABS 1.7  --   --   --   --   --   --   MONOABS 1.0  --   --   --   --   --   --   EOSABS 0.0  --   --   --   --   --   --   BASOSABS 0.0  --   --   --   --   --   --    < > = values in this interval not displayed.    Chemistries  Recent Labs  Lab 12/02/17 1519 12/02/17 2001 12/03/17 0528 12/04/17 0525  NA 148* 149* 146* 148*  K 3.8 3.7 3.4* 3.7  CL 111 112* 111 114*  CO2 22  --  27 27  GLUCOSE 198* 180* 146* 103*  BUN 50* 39* 37* 25*  CREATININE 0.91 0.60 0.65 0.60  CALCIUM 10.5*  --  9.3 8.9  AST 17  --   --   --   ALT 11*  --   --   --   ALKPHOS 58  --   --   --   BILITOT 0.8  --   --   --     Lab Results  Component Value Date   HGBA1C 6.0 (H) 11/25/2017   --------------------------------------------------------------------------------  Inpatient Medications  Scheduled Meds: . chlorhexidine  15 mL Mouth Rinse BID  . fluticasone  2 spray Each Nare Daily  . insulin aspart  0-9 Units Subcutaneous Q4H  . mouth rinse  15 mL Mouth Rinse q12n4p  . metoprolol tartrate  2.5 mg Intravenous Q6H   Continuous Infusions:  PRN Meds:.acetaminophen **OR** acetaminophen, ondansetron **OR** ondansetron (ZOFRAN) IV  Micro Results Recent Results (from the past 240 hour(s))  Blood Culture (routine x 2)     Status: None (Preliminary result)   Collection Time: 12/02/17  3:20 PM    Result Value Ref Range Status   Specimen Description BLOOD LEFT WRIST  Final   Special Requests   Final    BOTTLES DRAWN AEROBIC AND ANAEROBIC Blood Culture adequate volume   Culture   Final    NO GROWTH 3 DAYS Performed at [facility], [address], [place], [place] [address]    Report Status PENDING  Incomplete  Blood Culture (routine x 2)     Status:  None (Preliminary result)   Collection Time: 12/02/17  3:21 PM  Result Value Ref Range Status   Specimen Description LEFT ANTECUBITAL  Final   Special Requests   Final    BOTTLES DRAWN AEROBIC AND ANAEROBIC Blood Culture adequate volume   Culture   Final    NO GROWTH 3 DAYS Performed at [facility], [address], [place], [place] [address]    Report Status PENDING  Incomplete  Urine culture     Status: None   Collection Time: 12/02/17  3:40 PM  Result Value Ref Range Status   Specimen Description   Final    URINE, CATHETERIZED Performed at [facility], [address], [place], [place] [address]    Special Requests   Final    NONE Performed at [facility], [address] [place], [place] [address]    Culture   Final    NO GROWTH Performed at [facility], [place] [address], [place], [place] [address]    Report Status 12/04/2017 FINAL  Final  MRSA PCR Screening     Status: None   Collection Time: 12/02/17 11:38 PM  Result Value Ref Range Status   MRSA by PCR NEGATIVE NEGATIVE Final    Comment:        The GeneXpert MRSA Assay (FDA approved for NASAL specimens only), is one component of a comprehensive MRSA colonization surveillance program. It is not intended to diagnose MRSA infection nor to guide or monitor treatment for MRSA infections. Performed at [facility], [address] [place], [place] [address]    Radiology Reports Ct Angio Head W/cm &/or Wo Cm  Result Date: 11/23/2017 CLINICAL DATA:  77 y/o  F; altered mental status of unclear cause. EXAM: CT ANGIOGRAPHY HEAD AND NECK CT PERFUSION BRAIN  TECHNIQUE: Multidetector CT imaging of the head and neck was performed using the standard protocol during bolus administration of intravenous contrast. Multiplanar CT image reconstructions and MIPs were obtained to evaluate the vascular anatomy. Carotid stenosis measurements (when applicable) are obtained utilizing NASCET criteria, using the distal internal carotid diameter as the denominator. Multiphase CT imaging of the brain was performed following IV bolus contrast injection. Subsequent parametric perfusion maps were calculated using RAPID software. CONTRAST:  165m ISOVUE-370 IOPAMIDOL (ISOVUE-370) INJECTION 76% COMPARISON:  11/23/2017 CT head.  12/09/2004 MRI head. FINDINGS: CT HEAD FINDINGS Brain: Region of hypoattenuation within the right superior cerebellar hemisphere and extending into the right hemi pons compatible with late acute to subacute infarction (series 5, image 439and series 3, image 12). No associated hemorrhage or mass effect. No additional region of acute infarction, hemorrhage, or mass effect in the brain identified. Stable background of chronic microvascular ischemic changes and parenchymal volume loss of the supratentorial brain. Several small chronic lacunar infarcts within basal ganglia. Vascular: As below. Skull: Normal. Negative for fracture or focal lesion. Sinuses/Orbits: No acute finding. Other: None. ASPECTS ([place] Early CT Score) - Ganglionic level infarction (caudate, lentiform nuclei, internal capsule, insula, M1-M3 cortex): 7 - Supraganglionic infarction (M4-M6 cortex): 3 Total score (0-10 with 10 being normal): 10 Review of the MIP images confirms the above findings CTA NECK FINDINGS Aortic arch: Standard branching. Imaged portion shows no evidence of aneurysm or dissection. No significant stenosis of the major arch vessel origins. Mild calcific atherosclerosis. Right carotid system: No evidence of dissection, stenosis (50% or greater) or occlusion. Mild non  stenotic calcific atherosclerosis of the carotid bifurcation. Left carotid system: No evidence of dissection, stenosis (50%  or greater) or occlusion. Mild non stenotic calcific atherosclerosis of the carotid bifurcation. Vertebral arteries: Left dominant. No evidence of dissection, stenosis (50% or greater) or occlusion. Skeleton: C6-T1 severe erosive discogenic degenerative changes with endplate eburnation and grade 1 anterolisthesis at the C7-T1 level with there is severe facet arthritis. Bones are demineralized. Other neck: Multiple thyroid nodules measuring up to 9 mm on the left. Upper chest: Negative. Review of the MIP images confirms the above findings CTA HEAD FINDINGS Anterior circulation: No significant stenosis, proximal occlusion, aneurysm, or vascular malformation. The right ICA is larger than the left ICA likely due to variant anatomy where bilateral ACA are perfused by the right A1. Posterior circulation: Left P1 short segment of mild stenosis and right P1/P2 tandem segments of severe stenosis. Irregularity and mild stenosis of the left SCA origin. The right SCA attenuates in caliber early and appears occluded (series 12, image 120 and series 15, image 26). Venous sinuses: As permitted by contrast timing, patent. Anatomic variants: Anterior communicating artery and right posterior communicating artery. No left posterior communicating artery identified, likely hypoplastic or absent. Review of the MIP images confirms the above findings. CT Brain Perfusion Findings: CBF (<30%) Volume: 44m Perfusion (Tmax>6.0s) volume: 337mMismatch Volume: 2969mnfarction Location:Right hemi pons and superior cerebellar hemisphere infarction. The area of infarction on noncontrast CT of head is larger than CBF (<30%) Volume indicating pseudonormalization of a region of the core infarction. IMPRESSION: CT head: 1. 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 No hemorrhage or associated  mass effect. 2. Stable supratentorial chronic microvascular ischemic changes and parenchymal volume loss of the brain. CTA neck: 1. Patent carotid and vertebral arteries. No dissection, aneurysm, or hemodynamically significant stenosis utilizing NASCET criteria. 2. Severe cervical spondylosis at the C6-T1 levels. CTA head: 1. Right SCA occlusion. 2. Tandem segments of severe stenosis of right PCA. 3. Otherwise no large vessel occlusion, aneurysm, or significant stenosis is identified. CT brain perfusion: Perfusion anomaly in the right SCA distribution. The area of infarction on noncontrast CT of head is larger than CBF (<30%) Volume indicating pseudonormalization of a region of the core infarction. These results were called by telephone at the time of interpretation on 11/23/2017 at 9:03 pm to Dr. KATFrancine Gravenwho verbally acknowledged these results. Electronically Signed   By: LanKristine GarbeD.   On: 11/23/2017 21:07   Dg Chest 1 View  Result Date: 11/23/2017 CLINICAL DATA:  Altered mental status EXAM: CHEST  1 VIEW COMPARISON:  09/13/2014, 08/13/2014 FINDINGS: Mildly low lung volume. Small left pleural effusion with dense airspace disease at the lingula and left base. Cardiomegaly with aortic atherosclerosis. Minimal vascular congestion. No pneumothorax. IMPRESSION: 1. Mild left pleural effusion or thickening. Airspace disease at the lingula and left base, some of this may be chronic but difficult to exclude an acute infiltrate in the region 2. Cardiomegaly with mild vascular congestion Electronically Signed   By: KimDonavan FoilD.   On: 11/23/2017 18:29   Ct Head Wo Contrast  Result Date: 12/02/2017 CLINICAL DATA:  Altered level of consciousness.  Recent stroke. EXAM: CT HEAD WITHOUT CONTRAST TECHNIQUE: Contiguous axial images were obtained from the base of the skull through the vertex without intravenous contrast. COMPARISON:  Head CT from 9 days ago. FINDINGS: Brain: Subacute infarct  in the right superior cerebellum and right posterior-lateral midbrain. No visible acute or interval infarct. No hemorrhage, hydrocephalus, or collection. Cerebral volume loss with ventriculomegaly. Mild chronic small vessel ischemia  in the cerebral white matter. Vascular: Atherosclerotic calcification.  No hyperdense vessel. Skull: No acute or aggressive finding Sinuses/Orbits: Negative IMPRESSION: 1. Known subacute right superior cerebellar infarct. No hemorrhagic conversion. 2. No acute or interval abnormality. Electronically Signed   By: Monte Fantasia M.D.   On: 12/02/2017 16:38   Ct Head Wo Contrast  Result Date: 11/23/2017 CLINICAL DATA:  Lethargy following hydrocodone administration EXAM: CT HEAD WITHOUT CONTRAST CT CERVICAL SPINE WITHOUT CONTRAST TECHNIQUE: Multidetector CT imaging of the head and cervical spine was performed following the standard protocol without intravenous contrast. Multiplanar CT image reconstructions of the cervical spine were also generated. COMPARISON:  11/15/2007 FINDINGS: CT HEAD FINDINGS Brain: No evidence of acute infarction, hemorrhage, hydrocephalus, extra-axial collection or mass lesion/mass effect. Chronic atrophic and ischemic changes are noted. Vascular: No hyperdense vessel or unexpected calcification. Skull: Normal. Negative for fracture or focal lesion. Sinuses/Orbits: No acute finding. Other: None. CT CERVICAL SPINE FINDINGS Alignment: Alignment is within normal limits with the exception of degenerative anterolisthesis of C7 on T1. Skull base and vertebrae: 7 cervical segments are noted. Disc space narrowing is noted at C5-6, C6-7 and C7-T1. Significant sclerosis is noted at the C7-T1 interspace. Degenerative anterolisthesis is seen at C7-T1. Associated central canal stenosis is noted at this level. No acute fracture or acute facet abnormality is noted. Facet hypertrophic changes are noted at multiple levels. Soft tissues and spinal canal: No prevertebral fluid or  swelling. No visible canal hematoma. Upper chest: Within normal limits. Other: None IMPRESSION: CT of the head: Chronic atrophic and ischemic changes without acute abnormality. CT of the cervical spine: Multilevel degenerative change without acute abnormality Electronically Signed   By: Inez Catalina M.D.   On: 11/23/2017 18:33   Ct Angio Neck W And/or Wo Contrast  Result Date: 11/23/2017 CLINICAL DATA:  76 y/o  F; altered mental status of unclear cause. EXAM: CT ANGIOGRAPHY HEAD AND NECK CT PERFUSION BRAIN TECHNIQUE: Multidetector CT imaging of the head and neck was performed using the standard protocol during bolus administration of intravenous contrast. Multiplanar CT image reconstructions and MIPs were obtained to evaluate the vascular anatomy. Carotid stenosis measurements (when applicable) are obtained utilizing NASCET criteria, using the distal internal carotid diameter as the denominator. Multiphase CT imaging of the brain was performed following IV bolus contrast injection. Subsequent parametric perfusion maps were calculated using RAPID software. CONTRAST:  184m ISOVUE-370 IOPAMIDOL (ISOVUE-370) INJECTION 76% COMPARISON:  11/23/2017 CT head.  12/09/2004 MRI head. FINDINGS: CT HEAD FINDINGS Brain: Region of hypoattenuation within the right superior cerebellar hemisphere and extending into the right hemi pons compatible with late acute to subacute infarction (series 5, image 429and series 3, image 12). No associated hemorrhage or mass effect. No additional region of acute infarction, hemorrhage, or mass effect in the brain identified. Stable background of chronic microvascular ischemic changes and parenchymal volume loss of the supratentorial brain. Several small chronic lacunar infarcts within basal ganglia. Vascular: As below. Skull: Normal. Negative for fracture or focal lesion. Sinuses/Orbits: No acute finding. Other: None. ASPECTS ([place]roke [place] Early CT Score) - Ganglionic level infarction  (caudate, lentiform nuclei, internal capsule, insula, M1-M3 cortex): 7 - Supraganglionic infarction (M4-M6 cortex): 3 Total score (0-10 with 10 being normal): 10 Review of the MIP images confirms the above findings CTA NECK FINDINGS Aortic arch: Standard branching. Imaged portion shows no evidence of aneurysm or dissection. No significant stenosis of the major arch vessel origins. Mild calcific atherosclerosis. Right carotid system: No evidence of dissection, stenosis (  50% or greater) or occlusion. Mild non stenotic calcific atherosclerosis of the carotid bifurcation. Left carotid system: No evidence of dissection, stenosis (50% or greater) or occlusion. Mild non stenotic calcific atherosclerosis of the carotid bifurcation. Vertebral arteries: Left dominant. No evidence of dissection, stenosis (50% or greater) or occlusion. Skeleton: C6-T1 severe erosive discogenic degenerative changes with endplate eburnation and grade 1 anterolisthesis at the C7-T1 level with there is severe facet arthritis. Bones are demineralized. Other neck: Multiple thyroid nodules measuring up to 9 mm on the left. Upper chest: Negative. Review of the MIP images confirms the above findings CTA HEAD FINDINGS Anterior circulation: No significant stenosis, proximal occlusion, aneurysm, or vascular malformation. The right ICA is larger than the left ICA likely due to variant anatomy where bilateral ACA are perfused by the right A1. Posterior circulation: Left P1 short segment of mild stenosis and right P1/P2 tandem segments of severe stenosis. Irregularity and mild stenosis of the left SCA origin. The right SCA attenuates in caliber early and appears occluded (series 12, image 120 and series 15, image 26). Venous sinuses: As permitted by contrast timing, patent. Anatomic variants: Anterior communicating artery and right posterior communicating artery. No left posterior communicating artery identified, likely hypoplastic or absent. Review of the  MIP images confirms the above findings. CT Brain Perfusion Findings: CBF (<30%) Volume: 53m Perfusion (Tmax>6.0s) volume: 373mMismatch Volume: 2952mnfarction Location:Right hemi pons and superior cerebellar hemisphere infarction. The area of infarction on noncontrast CT of head is larger than CBF (<30%) Volume indicating pseudonormalization of a region of the core infarction. IMPRESSION: CT head: 1. 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 No hemorrhage or associated mass effect. 2. Stable supratentorial chronic microvascular ischemic changes and parenchymal volume loss of the brain. CTA neck: 1. Patent carotid and vertebral arteries. No dissection, aneurysm, or hemodynamically significant stenosis utilizing NASCET criteria. 2. Severe cervical spondylosis at the C6-T1 levels. CTA head: 1. Right SCA occlusion. 2. Tandem segments of severe stenosis of right PCA. 3. Otherwise no large vessel occlusion, aneurysm, or significant stenosis is identified. CT brain perfusion: Perfusion anomaly in the right SCA distribution. The area of infarction on noncontrast CT of head is larger than CBF (<30%) Volume indicating pseudonormalization of a region of the core infarction. These results were called by telephone at the time of interpretation on 11/23/2017 at 9:03 pm to Dr. KATFrancine Gravenwho verbally acknowledged these results. Electronically Signed   By: LanKristine GarbeD.   On: 11/23/2017 21:07   Ct Cervical Spine Wo Contrast  Result Date: 11/23/2017 CLINICAL DATA:  Lethargy following hydrocodone administration EXAM: CT HEAD WITHOUT CONTRAST CT CERVICAL SPINE WITHOUT CONTRAST TECHNIQUE: Multidetector CT imaging of the head and cervical spine was performed following the standard protocol without intravenous contrast. Multiplanar CT image reconstructions of the cervical spine were also generated. COMPARISON:  11/15/2007 FINDINGS: CT HEAD FINDINGS Brain: No evidence of  acute infarction, hemorrhage, hydrocephalus, extra-axial collection or mass lesion/mass effect. Chronic atrophic and ischemic changes are noted. Vascular: No hyperdense vessel or unexpected calcification. Skull: Normal. Negative for fracture or focal lesion. Sinuses/Orbits: No acute finding. Other: None. CT CERVICAL SPINE FINDINGS Alignment: Alignment is within normal limits with the exception of degenerative anterolisthesis of C7 on T1. Skull base and vertebrae: 7 cervical segments are noted. Disc space narrowing is noted at C5-6, C6-7 and C7-T1. Significant sclerosis is noted at the C7-T1 interspace. Degenerative anterolisthesis is seen at C7-T1. Associated central canal stenosis is noted at  this level. No acute fracture or acute facet abnormality is noted. Facet hypertrophic changes are noted at multiple levels. Soft tissues and spinal canal: No prevertebral fluid or swelling. No visible canal hematoma. Upper chest: Within normal limits. Other: None IMPRESSION: CT of the head: Chronic atrophic and ischemic changes without acute abnormality. CT of the cervical spine: Multilevel degenerative change without acute abnormality Electronically Signed   By: Inez Catalina M.D.   On: 11/23/2017 18:33   Mr Brain Wo Contrast  Result Date: 11/24/2017 CLINICAL DATA:  Altered mental status EXAM: MRI HEAD WITHOUT CONTRAST TECHNIQUE: Multiplanar, multiecho pulse sequences of the brain and surrounding structures were obtained without intravenous contrast. COMPARISON:  Head CT 11/23/2017 FINDINGS: BRAIN: The midline structures are normal. There is abnormal diffusion restriction within the dorsolateral right pons and superior right cerebellum, within the right superior cerebellar artery territory, as demonstrated on the earlier CT. No mass lesion, hydrocephalus, dural abnormality or extra-axial collection. Early confluent hyperintense T2-weighted signal of the periventricular and deep white matter, most commonly due to chronic  ischemic microangiopathy. No age-advanced or lobar predominant atrophy. There are a few central chronic microhemorrhages. VASCULAR: Major intracranial arterial and venous sinus flow voids are preserved. SKULL AND UPPER CERVICAL SPINE: The visualized skull base, calvarium, upper cervical spine and extracranial soft tissues are normal. SINUSES/ORBITS: No fluid levels or advanced mucosal thickening. No mastoid or middle ear effusion. Normal orbits. IMPRESSION: 1. 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 2. No hemorrhage or mass effect. 3. Chronic ischemic microangiopathy. Electronically Signed   By: Ulyses Jarred M.D.   On: 11/24/2017 06:03   Ct Cerebral Perfusion W Contrast  Result Date: 11/23/2017 CLINICAL DATA:  77 y/o  F; altered mental status of unclear cause. EXAM: CT ANGIOGRAPHY HEAD AND NECK CT PERFUSION BRAIN TECHNIQUE: Multidetector CT imaging of the head and neck was performed using the standard protocol during bolus administration of intravenous contrast. Multiplanar CT image reconstructions and MIPs were obtained to evaluate the vascular anatomy. Carotid stenosis measurements (when applicable) are obtained utilizing NASCET criteria, using the distal internal carotid diameter as the denominator. Multiphase CT imaging of the brain was performed following IV bolus contrast injection. Subsequent parametric perfusion maps were calculated using RAPID software. CONTRAST:  136m ISOVUE-370 IOPAMIDOL (ISOVUE-370) INJECTION 76% COMPARISON:  11/23/2017 CT head.  12/09/2004 MRI head. FINDINGS: CT HEAD FINDINGS Brain: Region of hypoattenuation within the right superior cerebellar hemisphere and extending into the right hemi pons compatible with late acute to subacute infarction (series 5, image 462and series 3, image 12). No associated hemorrhage or mass effect. No additional region of acute infarction, hemorrhage, or mass effect in  the brain identified. Stable background of chronic microvascular ischemic changes and parenchymal volume loss of the supratentorial brain. Several small chronic lacunar infarcts within basal ganglia. Vascular: As below. Skull: Normal. Negative for fracture or focal lesion. Sinuses/Orbits: No acute finding. Other: None. ASPECTS (AMcAlesterStroke Program Early CT Score) - Ganglionic level infarction (caudate, lentiform nuclei, internal capsule, insula, M1-M3 cortex): 7 - Supraganglionic infarction (M4-M6 cortex): 3 Total score (0-10 with 10 being normal): 10 Review of the MIP images confirms the above findings CTA NECK FINDINGS Aortic arch: Standard branching. Imaged portion shows no evidence of aneurysm or dissection. No significant stenosis of the major arch vessel origins. Mild calcific atherosclerosis. Right carotid system: No evidence of dissection, stenosis (50% or greater) or occlusion. Mild non stenotic calcific atherosclerosis of the  carotid bifurcation. Left carotid system: No evidence of dissection, stenosis (50% or greater) or occlusion. Mild non stenotic calcific atherosclerosis of the carotid bifurcation. Vertebral arteries: Left dominant. No evidence of dissection, stenosis (50% or greater) or occlusion. Skeleton: C6-T1 severe erosive discogenic degenerative changes with endplate eburnation and grade 1 anterolisthesis at the C7-T1 level with there is severe facet arthritis. Bones are demineralized. Other neck: Multiple thyroid nodules measuring up to 9 mm on the left. Upper chest: Negative. Review of the MIP images confirms the above findings CTA HEAD FINDINGS Anterior circulation: No significant stenosis, proximal occlusion, aneurysm, or vascular malformation. The right ICA is larger than the left ICA likely due to variant anatomy where bilateral ACA are perfused by the right A1. Posterior circulation: Left P1 short segment of mild stenosis and right P1/P2 tandem segments of severe stenosis. Irregularity  and mild stenosis of the left SCA origin. The right SCA attenuates in caliber early and appears occluded (series 12, image 120 and series 15, image 26). Venous sinuses: As permitted by contrast timing, patent. Anatomic variants: Anterior communicating artery and right posterior communicating artery. No left posterior communicating artery identified, likely hypoplastic or absent. Review of the MIP images confirms the above findings. CT Brain Perfusion Findings: CBF (<30%) Volume: 73m Perfusion (Tmax>6.0s) volume: 365mMismatch Volume: 2950mnfarction Location:Right hemi pons and superior cerebellar hemisphere infarction. The area of infarction on noncontrast CT of head is larger than CBF (<30%) Volume indicating pseudonormalization of a region of the core infarction. IMPRESSION: CT head: 1. 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 No hemorrhage or associated mass effect. 2. Stable supratentorial chronic microvascular ischemic changes and parenchymal volume loss of the brain. CTA neck: 1. Patent carotid and vertebral arteries. No dissection, aneurysm, or hemodynamically significant stenosis utilizing NASCET criteria. 2. Severe cervical spondylosis at the C6-T1 levels. CTA head: 1. Right SCA occlusion. 2. Tandem segments of severe stenosis of right PCA. 3. Otherwise no large vessel occlusion, aneurysm, or significant stenosis is identified. CT brain perfusion: Perfusion anomaly in the right SCA distribution. The area of infarction on noncontrast CT of head is larger than CBF (<30%) Volume indicating pseudonormalization of a region of the core infarction. These results were called by telephone at the time of interpretation on 11/23/2017 at 9:03 pm to Dr. KATFrancine Gravenwho verbally acknowledged these results. Electronically Signed   By: LanKristine GarbeD.   On: 11/23/2017 21:07   Dg Chest Portable 1 View  Result Date: 12/02/2017 CLINICAL DATA:  Unresponsive,  recent brainstem stroke, follow-up possible pneumonia on prior chest radiograph EXAM: PORTABLE CHEST 1 VIEW COMPARISON:  11/23/2017 FINDINGS: Lingular opacity, unchanged, favoring atelectasis/scarring although pneumonia is not excluded. Left lung base is obscured, although this favors overlying soft tissue, as this appearance is chronic dating back to 2016. No frank interstitial edema.  Right lung is clear.  No pneumothorax. The heart is normal in size. IMPRESSION: Lingular opacity, unchanged, possibly scarring, pneumonia not excluded. Electronically Signed   By: SriJulian HyD.   On: 12/02/2017 17:05   Dg Chest Port 1v Same Day  Result Date: 12/02/2017 CLINICAL DATA:  Aspiration. EXAM: PORTABLE CHEST 1 VIEW COMPARISON:  December 02, 2017 FINDINGS: A tubular density projected over the right upper to mid lung was not seen earlier today. Left retrocardiac opacity remains unchanged. The heart, hila, mediastinum, lungs, and pleura are otherwise normal. IMPRESSION: A tubular/thickened platelike opacity in the right upper lung likely represents either something on the  patient or atelectasis. This does not have the typical appearance of an infiltrate or aspiration. This was not seen earlier today. Persistent opacity in the left base, unchanged. No other changes. Electronically Signed   By: Dorise Bullion III M.D   On: 12/02/2017 22:14   Dg Swallowing Func-speech Pathology  Result Date: 11/25/2017 Objective Swallowing Evaluation: Type of Study: MBS-Modified Barium Swallow Study  Patient Details Name: EVELEN VAZGUEZ MRN: [id number] Date of Birth: [birth year] Today's Date: 11/25/2017 Time: SLP Start Time (ACUTE ONLY): 1046 -SLP Stop Time (ACUTE ONLY): 1059 SLP Time Calculation (min) (ACUTE ONLY): 13 min Past Medical History: Past Medical History: Diagnosis Date . Arthritis  . Atrial fibrillation ([place])  . Atrial fibrillation with RVR ([place]) 09/13/2014 . Chronic pain  . Diabetes mellitus without complication ([place])  . DVT  (deep venous thrombosis) ([place])  . Hiatal hernia  . Hypertension  . Osteoporosis  . Pulmonary emboli ([place])  . Sarcoidosis  . Vertigo  . Wheelchair bound  Past Surgical History: Past Surgical History: Procedure Laterality Date . ABDOMINAL HYSTERECTOMY   . I&D EXTREMITY Left 09/13/2014  Procedure: IRRIGATION AND DEBRIDEMENT EXTREMITY;  Surgeon: Linna Hoff, MD;  Location: [facility];  Service: Orthopedics;  Laterality: Left; . INTRAMEDULLARY (IM) NAIL INTERTROCHANTERIC Left 06/27/2015  Procedure: INTRAMEDULLARY (IM) NAIL INTERTROCHANTRIC AFFIXIS;  Surgeon: Marybelle Killings, MD;  Location: [facility];  Service: Orthopedics;  Laterality: Left; . JOINT REPLACEMENT    toe . LUNG LOBECTOMY Left   Lower HPI: 77 year old female with medical history significant for type 2 diabetes mellitus, hypertension, chronic pain on opiate analgesia, pulmonary embolism, sarcoidosis initially presented to the ED poorly responsive in setting of questionable opiod overdose, found to have neuro change. MRI acute infarct of the superior right cerebellum and right dorso lateral pons, in the superior cerebellar artery territory. CXR mild left pleural effusion or thickening. Airspace disease at the lingula and left base, some of this may be chronic but difficult to exclude an acute infiltrate in the region, cardiomegaly with mild vascular congestion.  No data recorded Assessment / Plan / Recommendation CHL IP CLINICAL IMPRESSIONS 11/25/2017 Clinical Impression Moderate oral dysphagia marked by anterior spill, decreased control and delayed oral transit. Pharyngeal phase characterized by sensory impairments with late swallow initiation at the pyriform sinuses with thinner consistencies and valleculae with honey thick. Laryngeal penetration to vocal cords consistently with thin and nectar barium without sensation and ineffective attempts at cued throat clear/cough. No appreciable post swallow residue. Positioning somewhat challenging due to kyphosis. Recommend  honey thick liquids, Dys 2 texture, crush pills and full assist and supervision with ST for safety and education.   SLP Visit Diagnosis Dysphagia, oropharyngeal phase (R13.12) Attention and concentration deficit following -- Frontal lobe and executive function deficit following -- Impact on safety and function Moderate aspiration risk   CHL IP TREATMENT RECOMMENDATION 11/25/2017 Treatment Recommendations Therapy as outlined in treatment plan below   Prognosis 11/25/2017 Prognosis for Safe Diet Advancement (No Data) Barriers to Reach Goals Cognitive deficits Barriers/Prognosis Comment -- CHL IP DIET RECOMMENDATION 11/25/2017 SLP Diet Recommendations Dysphagia 2 (Fine chop) solids;Honey thick liquids Liquid Administration via Cup Medication Administration Crushed with puree Compensations Slow rate;Small sips/bites;Lingual sweep for clearance of pocketing Postural Changes Seated upright at 90 degrees   CHL IP OTHER RECOMMENDATIONS 11/25/2017 Recommended Consults -- Oral Care Recommendations Oral care BID Other Recommendations --   CHL IP FOLLOW UP RECOMMENDATIONS 11/25/2017 Follow up Recommendations Other (comment)   CHL IP FREQUENCY AND DURATION 11/25/2017 Speech  Therapy Frequency (ACUTE ONLY) min 2x/week Treatment Duration 2 weeks      CHL IP ORAL PHASE 11/25/2017 Oral Phase Impaired Oral - Pudding Teaspoon -- Oral - Pudding Cup -- Oral - Honey Teaspoon -- Oral - Honey Cup Decreased bolus cohesion;Delayed oral transit;Weak lingual manipulation;Left anterior bolus loss Oral - Nectar Teaspoon -- Oral - Nectar Cup Weak lingual manipulation;Delayed oral transit;Decreased bolus cohesion;Left anterior bolus loss Oral - Nectar Straw -- Oral - Thin Teaspoon -- Oral - Thin Cup -- Oral - Thin Straw WFL Oral - Puree Delayed oral transit Oral - Mech Soft -- Oral - Regular Weak lingual manipulation;Delayed oral transit Oral - Multi-Consistency -- Oral - Pill -- Oral Phase - Comment --  CHL IP PHARYNGEAL PHASE 11/25/2017 Pharyngeal Phase  Impaired Pharyngeal- Pudding Teaspoon -- Pharyngeal -- Pharyngeal- Pudding Cup -- Pharyngeal -- Pharyngeal- Honey Teaspoon -- Pharyngeal -- Pharyngeal- Honey Cup -- Pharyngeal -- Pharyngeal- Nectar Teaspoon -- Pharyngeal -- Pharyngeal- Nectar Cup Delayed swallow initiation-pyriform sinuses;Penetration/Aspiration during swallow Pharyngeal Material enters airway, CONTACTS cords and not ejected out Pharyngeal- Nectar Straw -- Pharyngeal -- Pharyngeal- Thin Teaspoon -- Pharyngeal -- Pharyngeal- Thin Cup -- Pharyngeal -- Pharyngeal- Thin Straw Delayed swallow initiation-pyriform sinuses;Penetration/Aspiration during swallow Pharyngeal Material enters airway, CONTACTS cords and not ejected out Pharyngeal- Puree Penetration/Aspiration during swallow Pharyngeal Material enters airway, remains ABOVE vocal cords and not ejected out Pharyngeal- Mechanical Soft -- Pharyngeal -- Pharyngeal- Regular Delayed swallow initiation-vallecula Pharyngeal -- Pharyngeal- Multi-consistency -- Pharyngeal -- Pharyngeal- Pill -- Pharyngeal -- Pharyngeal Comment --  CHL IP CERVICAL ESOPHAGEAL PHASE 11/25/2017 Cervical Esophageal Phase WFL Pudding Teaspoon -- Pudding Cup -- Honey Teaspoon -- Honey Cup -- Nectar Teaspoon -- Nectar Cup -- Nectar Straw -- Thin Teaspoon -- Thin Cup -- Thin Straw -- Puree -- Mechanical Soft -- Regular -- Multi-consistency -- Pill -- Cervical Esophageal Comment -- No flowsheet data found. Houston Siren 11/25/2017, 11:56 AM Orbie Pyo Colvin Caroli.Ed CCC-SLP Pager [phone]               Time Spent:  15 minutes   Barton Dubois M.D on 12/05/2017 at 5:05 PM  Between 7am to 7pm - Pager - [phone]  After 7pm go to [email or web address] - password [facility]  [facility] -  Office  [phone]

## 2017-12-05 NOTE — Progress Notes (Signed)
  Speech Language Pathology Treatment: Dysphagia  Patient Details Name: Elizabeth Drake MRN: [id number] DOB: 26-Jan-1941 Today's Date: 12/05/2017 Time: 5462-7035 SLP Time Calculation (min) (ACUTE ONLY): 18 min  Assessment / Plan / Recommendation Clinical Impression  Pt in bed and sleeping when SLP arrived.  Pt awoke while SLP talking to family.  She agreed to oral care and diet check.  Pt was repositioned for diet check and required multiple attempts to reposition due to poor trunk support.  Oral care provided prior to diet check.  Oral residue noted in the lower right quadrant of Pt's mouth during oral care, which was removed.  Pt consumed ice chips x3 to relieve xerostomia.  Cup sips of honey thick tea and teaspoons of puree consumed w/o overt s/s of aspiration.  Pt cued to swallow via moderate verbal cues.  Family re-educated on oral care importance prior to and after PO.  Recommend continuing on current D1/puree diet with HTLs.  Husband reported Pt ate approximately 50% of her lunch today.     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 coli UTI who was discharged to SNF for rehab on 4/23, A. fib , PE on Xarelto, sarcoidosis on chronic prednisone, diabetes mellitus, uncontrolled blood pressure hypertension, chronic pain, arthritis, deconditioning with wheelchair-bound status for almost 2 years was sent from SNF with acute encephalopathy.  In the ED with she was septic with fever of 101.4 F, WC of 10 point 8K, sodium of 149 and lactic acid of 1.73. Pt had MBSS at [facility] on 11/25/17 with recommendation for D1/puree and honey thick liquids. Pt now with dehydration.      SLP Plan  Continue with diet check of D1/HTL, oral care before and after PO, family/caregiver education  Patient needs continued Speech Lanaguage Pathology Services    Recommendations  Diet recommendations: Dysphagia 1 (puree);Honey-thick liquid Liquids  provided via: Teaspoon;Cup Medication Administration: Crushed with puree Supervision: Full supervision/cueing for compensatory strategies Compensations: Slow rate;Small sips/bites;Lingual sweep for clearance of pocketing Postural Changes and/or Swallow Maneuvers: Seated upright 90 degrees;Upright 30-60 min after meal(Pt required repositioning due to poor trunk control)                Oral Care Recommendations: Oral care prior to ice chip/H20;Oral care before and after PO;Staff/trained caregiver to provide oral care Follow up Recommendations: Skilled Nursing facility SLP Visit Diagnosis: Dysphagia, oropharyngeal phase (R13.12) Plan: Other (Comment)(oral care instruction)       GO                Jen Mow, M.A., CF-SLP 12/05/2017, 3:40 PM

## 2017-12-06 DIAGNOSIS — E1159 Type 2 diabetes mellitus with other circulatory complications: Secondary | ICD-10-CM

## 2017-12-06 DIAGNOSIS — R0602 Shortness of breath: Secondary | ICD-10-CM

## 2017-12-06 DIAGNOSIS — Z86711 Personal history of pulmonary embolism: Secondary | ICD-10-CM

## 2017-12-06 DIAGNOSIS — Z7189 Other specified counseling: Secondary | ICD-10-CM

## 2017-12-06 LAB — BASIC METABOLIC PANEL
ANION GAP: 11 (ref 5–15)
BUN: 9 mg/dL (ref 6–20)
CALCIUM: 9 mg/dL (ref 8.9–10.3)
CO2: 23 mmol/L (ref 22–32)
Chloride: 110 mmol/L (ref 101–111)
Creatinine, Ser: 0.5 mg/dL (ref 0.44–1.00)
GFR calc Af Amer: >60 mL/min (ref >60)
Glucose, Bld: 147 mg/dL — ABNORMAL HIGH (ref 65–99)
POTASSIUM: 3.2 mmol/L — AB (ref 3.5–5.1)
Sodium: 144 mmol/L (ref 135–145)

## 2017-12-06 LAB — GLUCOSE, CAPILLARY
GLUCOSE-CAPILLARY: 148 mg/dL — AB (ref 65–99)
Glucose-Capillary: 126 mg/dL — ABNORMAL HIGH (ref 65–99)
Glucose-Capillary: 147 mg/dL — ABNORMAL HIGH (ref 65–99)
Glucose-Capillary: 187 mg/dL — ABNORMAL HIGH (ref 65–99)
Glucose-Capillary: 231 mg/dL — ABNORMAL HIGH (ref 65–99)

## 2017-12-06 MED ORDER — RIVAROXABAN 15 MG PO TABS: 15.0000 mg | ORAL_TABLET | Freq: Every day | ORAL | Status: DC

## 2017-12-06 MED ORDER — RIVAROXABAN 20 MG PO TABS
20.0000 mg | ORAL_TABLET | Freq: Every day | ORAL | Status: DC
  Administered 2017-12-06: 20 mg via ORAL
  Filled 2017-12-06: qty 1

## 2017-12-06 MED ORDER — RIVAROXABAN 15 MG PO TABS: 15.0000 mg | ORAL_TABLET | Freq: Every day | ORAL | Status: AC

## 2017-12-06 MED ORDER — ASPIRIN EC 81 MG PO TBEC
81.0000 mg | DELAYED_RELEASE_TABLET | Freq: Every day | ORAL | Status: DC
  Administered 2017-12-06: 81 mg via ORAL
  Filled 2017-12-06: qty 1

## 2017-12-06 MED ORDER — ASPIRIN 81 MG PO TBEC: 81.0000 mg | DELAYED_RELEASE_TABLET | Freq: Every day | ORAL | Status: AC

## 2017-12-06 NOTE — Progress Notes (Signed)
Palliative:  Elizabeth Drake is resting quietly in bed.  She will briefly make but not keep eye contact, her voice is very weak.  There is no family at bedside at this time. This is choices lunch tray is present.  I ask if she would like to eat.  I am unable to hear what she says at first, but she tells me that she is not hungry.  Ask if she would like some butter pecan ice cream (Magic cup).  She takes one bite, and opens her mouth for more, but still has an swallowed Magic cup in her mouth.  She is able to take her second bite without problem.  On her third bite of Magic cup however, she has a silent cough that goes on for quite some time.  I question if she is  silently aspirating. Conference with nursing staff related to aspiration risk, plan of care. Call to son/HC POA, Jodi Marble.  Left voicemail message.  25 minutes Quinn Axe, NP Palliative Medicine Team Team Phone # [phone]

## 2017-12-06 NOTE — Progress Notes (Signed)
Pt discharged back to [facility] per Dr. Dyann Kief. Pt's IV site D/C'd and WDL. Pt's VSS. Report called to Tyrone Schimke, nurse at [facility]. Pt left floor in stable condition via EMS stretcher accompanied by EMT's.

## 2017-12-06 NOTE — Clinical Social Work Note (Signed)
Patient discharged from [facility] on 11/26/17 and sent to [facility] for [facility].  Patient to return to facility at discharge to complete STR.    Renwick Asman, Clydene Pugh, LCSW

## 2017-12-06 NOTE — Care Management Important Message (Signed)
Important Message  Patient Details  Name: Elizabeth Drake MRN: [id number] Date of Birth: 01/08/1941   Medicare Important Message Given:  Yes    [name] 12/06/2017, 2:12 PM

## 2017-12-06 NOTE — Clinical Social Work Note (Signed)
Facility notified of patient's discharge.   Message left for spouse on answering service advising of discharge.  Patient's sister was advised of discharge.   Discharge clinicals sent.   Iverna Hammac, Clydene Pugh, LCSW

## 2017-12-06 NOTE — Discharge Summary (Signed)
Physician Discharge Summary  Elizabeth Drake JKK:[id number] DOB: Dec 09, 1940 DOA: 12/02/2017  PCP: Chevis Pretty, FNP  Admit date: 12/02/2017 Discharge date: 12/06/2017  Time spent: 35 minutes  Recommendations for Outpatient Follow-up:  Repeat basic metabolic panels in 1 week to follow electrolytes and renal function Repeat CBC in 1 week to follow hemoglobin trend Follow closely patient improvement or further decline with transition to quality and comfort care by hospice indicate that she fail to progress.  Patient is DNR and family aware of guarded condition after her most recent stroke and associated residual deficits.   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  Patient will be discharged back to skilled nursing facility for further care and rehabilitation.  Diet recommendation: dysphagia 1 and honey thick liquids. Heart healthy/modified carbohydrates   Filed Weights   12/02/17 1501 12/03/17 1012  Weight: 50.3 kg (111 lb) 48.3 kg (106 lb 7.7 oz)    History of present illness:  As per H&P written by Dr. Manuella Ghazi on 4/29 77 y.o. 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 coli UTI with recent discharge to skilled nursing facility on 4/23 who is brought to the ED from our facility due to  altered mentation noted at the facility over the last 2 days.  She has supposedly also had worsening oral intake noted.  Additionally, she was noted to be febrile.  No further history can be obtained as patient is level 5 caveat and no family members are at bedside.   ED Course: Vital signs are stable and rectal temperature on arrival to ED was 101.4 Fahrenheit according to ED physician.  She has a leukocytosis of 10,800, hemoglobin 11.9, sodium 149, chloride 112, BUN 39, creatinine 0.60, glucose 180, initial lactic acid of 1.73 which is improved to 1.64 after 2 liters of lactated Ringer's.  Blood cultures have been collected and she has been started on vancomycin and Zosyn.  CT of the head with no new abnormalities noted.  Urine analysis with no findings of UTI currently and 1 view chest x-ray with what appears to be a chronic lingular opacity with no changes from previous. She is noted to be DNR.   Hospital Course:  Acute toxic metabolic encephalopathy -Likely multifactorial, but most likely metabolic (with presentation of hypernatremia) -no fever and WBC's back to normal. -patient received empiric antibiotics; but w/o specific source and having sepsis features resolved, abx's were discontinued. -patient observed over 48 hours and no signs of systemic infection appreciated. -diet has been advanced as dictated by SPL -patient denies dysuria.   Dysphagia with poor p.o. intake -appreciate rec's from SPL -dysphagia 1 with honey thick liquids started.  -continue rehab and further evaluation by speech therapy at SNF  Paroxysmal A. fib Rate controlled -continue metoprolol -continue xarelto -CHADsVASC score 6  Essential hypertension -Continue metoprolol. -follow BP and adjust antihypertensive  regimen as needed   Diabetes mellitus -will resume glimepiride  -follow CBG's  Chronic pain syndrome -has not complained of any pain -and no pain meds has been given during hospitalization,  other than PRN tylenol   Recent right cerebellar and dorsal lateral pons infarct. -will resume preventive regimen -patient tolerating meds and also dysphagia 1 diet with honey thick liquids. -continue to pursuit rehab at SNF  Sarcoidosis -will continue continue chronic prednisone.  Failure to thrive and severe protein calorie malnutrition. -Multiple comorbidities with very guarded prognosis.   -Patient is DNR.   -appreciate palliative care service inputs and rec's -for now will treat what is treatable; but after long discussion with family, main goal is quality and if she ended failing to improve, decision is to transition gears toward hospice and comfort. .  Hypernatremia -Secondary to dehydration.   -resolved with IVF's -please repeat VMET in 1 week to follow electrolytes   hypokalemia -repleted and WNL at discharge -repeat BMET in 1 week to follow trend   Anemia of chronic disease -Monitor Hgb trend -no signs of acute bleeding appreciated.  Stage 1 pressure injury on sacral area -frequent reposition and preventive measures apply  -no signs of infection   Procedures:  Below for x-ray reports  Consultations:  Palliative care  Discharge Exam: Vitals:   12/06/17 1236 12/06/17 1239  BP: (!) 136/52   Pulse: 61   Resp:  18  Temp:  97.9 F (36.6 C)  SpO2:  100%   Gen: No fever, no chest pain, no shortness of breath.  Patient is stable in no acute distress.  Oriented x1 unable to follow simple commands.  Patient continue to eat dysphagia 1 with honey thick liquids. HEENT: No thrush, no erythema in oropharynx, no ears or nostrils discharges appreciated.  Nasal cannula in place.    Chest: Good air movement, no wheezing, no crackles.   CVS: Rate controlled, no murmurs, no rubs, no gallops.  GI: Soft, non-tenderness, no distention, positive bowel sounds.  Muscular skeletal: no edema, no cyanosis.  CNS: awake, following commands, oriented X2, right upper  extremities with 2/5 and LUE 3/5.  Discharge Instructions   Discharge Instructions    Discharge instructions   Complete by:  As directed    Please repeat basic metabolic panel to follow electrolytes and renal function Patient has been discharged on dysphagia 1 with honey thick liquids (make sure that she has an adequate hydration) Follow closely patient improvement or further decline; as goals of care during this hospitalization are looking to involved hospice and transition gears into just comfort care.     Allergies as of 12/06/2017      Reactions   Advil [ibuprofen] Shortness Of Breath, Swelling   Propoxyphene Swelling   Azo [phenazopyridine] Other (See Comments)   Unknown reaction   Indocin [indomethacin] Swelling, Other (See Comments)   Unknown reaction   Keflex [cephalexin] Other (See Comments)   Loratadine Other (See Comments)   unknown   Percocet [oxycodone-acetaminophen] Itching   10/12/15 Patient denies allergy to APAP      Medication List    STOP taking these medications   HYDROcodone-acetaminophen 10-325 MG tablet Commonly known as:  NORCO   meclizine 25 MG tablet Commonly known as:  ANTIVERT   metFORMIN 500 MG tablet Commonly known as:  GLUCOPHAGE   nitrofurantoin (macrocrystal-monohydrate) 100 MG capsule Commonly known as:  MACROBID     TAKE these medications   acetaminophen 500 MG tablet Commonly known as:  TYLENOL Take  2 tablets (1,000 mg total) by mouth every 8 (eight) hours.   aspirin 81 MG EC tablet Take 1 tablet (81 mg total) by mouth daily. Start taking on:  12/07/2017 What changed:    medication strength  how much to take   feeding supplement Liqd Take 1 Container by mouth 2 (two) times daily between meals.   glimepiride 2 MG tablet Commonly known as:  AMARYL Take 1 tablet (2 mg total) by mouth daily.   metoprolol tartrate 25 MG tablet Commonly known as:  LOPRESSOR Take 1 tablet (25 mg total) by mouth 2 (two) times daily.    mometasone 50 MCG/ACT nasal spray Commonly known as:  NASONEX 2 sprays per nostril qd What changed:    how much to take  how to take this  when to take this  additional instructions   multivitamin with minerals Tabs tablet Take 1 tablet by mouth daily.   polyethylene glycol packet Commonly known as:  MIRALAX / GLYCOLAX Take 17 g by mouth daily as needed. What changed:  when to take this   predniSONE 5 MG tablet Commonly known as:  DELTASONE TAKE 1 TABLET BY MOUTH EVERY DAY WITH BREAKFAST   Rivaroxaban 15 MG Tabs tablet Commonly known as:  XARELTO Take 1 tablet (15 mg total) by mouth daily. Start taking on:  12/07/2017 What changed:    medication strength  how much to take  how to take this  when to take this  additional instructions   senna 8.6 MG Tabs tablet Commonly known as:  SENOKOT Take 2 tablets (17.2 mg total) by mouth at bedtime.   vitamin C 500 MG tablet Commonly known as:  ASCORBIC ACID Take 500 mg by mouth 2 (two) times daily.   zinc sulfate 220 (50 Zn) MG capsule Take 220 mg by mouth daily.      Allergies  Allergen Reactions  . Advil [Ibuprofen] Shortness Of Breath and Swelling  . Propoxyphene Swelling  . Azo [Phenazopyridine] Other (See Comments)    Unknown reaction  . Indocin [Indomethacin] Swelling and Other (See Comments)    Unknown reaction  . Keflex [Cephalexin] Other (See Comments)  . Loratadine Other (See Comments)    unknown  . Percocet [Oxycodone-Acetaminophen] Itching    10/12/15 Patient denies allergy to APAP   Contact information for after-discharge care    Destination    [facility] .   Service:  Skilled Nursing Contact information: [place] [phone]              The results of significant diagnostics from this hospitalization (including imaging, microbiology, ancillary and laboratory) are listed below for reference.    Significant Diagnostic Studies: Ct  Angio Head W/cm &/or Wo Cm  Result Date: 11/23/2017 CLINICAL DATA:  76 y/o  F; altered mental status of unclear cause. EXAM: CT ANGIOGRAPHY HEAD AND NECK CT PERFUSION BRAIN TECHNIQUE: Multidetector CT imaging of the head and neck was performed using the standard protocol during bolus administration of intravenous contrast. Multiplanar CT image reconstructions and MIPs were obtained to evaluate the vascular anatomy. Carotid stenosis measurements (when applicable) are obtained utilizing NASCET criteria, using the distal internal carotid diameter as the denominator. Multiphase CT imaging of the brain was performed following IV bolus contrast injection. Subsequent parametric perfusion maps were calculated using RAPID software. CONTRAST:  168mL ISOVUE-370 IOPAMIDOL (ISOVUE-370) INJECTION 76% COMPARISON:  11/23/2017 CT head.  12/09/2004 MRI head. FINDINGS: CT HEAD FINDINGS Brain: Region of hypoattenuation  within the right superior cerebellar hemisphere and extending into the right hemi pons compatible with late acute to subacute infarction (series 5, image 43 and series 3, image 12). No associated hemorrhage or mass effect. No additional region of acute infarction, hemorrhage, or mass effect in the brain identified. Stable background of chronic microvascular ischemic changes and parenchymal volume loss of the supratentorial brain. Several small chronic lacunar infarcts within basal ganglia. Vascular: As below. Skull: Normal. Negative for fracture or focal lesion. Sinuses/Orbits: No acute finding. Other: None. ASPECTS ([place] Stroke Program Early CT Score) - Ganglionic level infarction (caudate, lentiform nuclei, internal capsule, insula, M1-M3 cortex): 7 - Supraganglionic infarction (M4-M6 cortex): 3 Total score (0-10 with 10 being normal): 10 Review of the MIP images confirms the above findings CTA NECK FINDINGS Aortic arch: Standard branching. Imaged portion shows no evidence of aneurysm or dissection. No significant  stenosis of the major arch vessel origins. Mild calcific atherosclerosis. Right carotid system: No evidence of dissection, stenosis (50% or greater) or occlusion. Mild non stenotic calcific atherosclerosis of the carotid bifurcation. Left carotid system: No evidence of dissection, stenosis (50% or greater) or occlusion. Mild non stenotic calcific atherosclerosis of the carotid bifurcation. Vertebral arteries: Left dominant. No evidence of dissection, stenosis (50% or greater) or occlusion. Skeleton: C6-T1 severe erosive discogenic degenerative changes with endplate eburnation and grade 1 anterolisthesis at the C7-T1 level with there is severe facet arthritis. Bones are demineralized. Other neck: Multiple thyroid nodules measuring up to 9 mm on the left. Upper chest: Negative. Review of the MIP images confirms the above findings CTA HEAD FINDINGS Anterior circulation: No significant stenosis, proximal occlusion, aneurysm, or vascular malformation. The right ICA is larger than the left ICA likely due to variant anatomy where bilateral ACA are perfused by the right A1. Posterior circulation: Left P1 short segment of mild stenosis and right P1/P2 tandem segments of severe stenosis. Irregularity and mild stenosis of the left SCA origin. The right SCA attenuates in caliber early and appears occluded (series 12, image 120 and series 15, image 26). Venous sinuses: As permitted by contrast timing, patent. Anatomic variants: Anterior communicating artery and right posterior communicating artery. No left posterior communicating artery identified, likely hypoplastic or absent. Review of the MIP images confirms the above findings. CT Brain Perfusion Findings: CBF (<30%) Volume: 100mL Perfusion (Tmax>6.0s) volume: 41mL Mismatch Volume: 70mL Infarction Location:Right hemi pons and superior cerebellar hemisphere infarction. The area of infarction on noncontrast CT of head is larger than CBF (<30%) Volume indicating  pseudonormalization of a region of the core infarction. IMPRESSION: CT head: 1. 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 No hemorrhage or associated mass effect. 2. Stable supratentorial chronic microvascular ischemic changes and parenchymal volume loss of the brain. CTA neck: 1. Patent carotid and vertebral arteries. No dissection, aneurysm, or hemodynamically significant stenosis utilizing NASCET criteria. 2. Severe cervical spondylosis at the C6-T1 levels. CTA head: 1. Right SCA occlusion. 2. Tandem segments of severe stenosis of right PCA. 3. Otherwise no large vessel occlusion, aneurysm, or significant stenosis is identified. CT brain perfusion: Perfusion anomaly in the right SCA distribution. The area of infarction on noncontrast CT of head is larger than CBF (<30%) Volume indicating pseudonormalization of a region of the core infarction. These results were called by telephone at the time of interpretation on 11/23/2017 at 9:03 pm to Dr. Francine Graven , who verbally acknowledged these results. Electronically Signed   By: Kristine Garbe M.D.   On: 11/23/2017  21:07   Dg Chest 1 View  Result Date: 11/23/2017 CLINICAL DATA:  Altered mental status EXAM: CHEST  1 VIEW COMPARISON:  09/13/2014, 08/13/2014 FINDINGS: Mildly low lung volume. Small left pleural effusion with dense airspace disease at the lingula and left base. Cardiomegaly with aortic atherosclerosis. Minimal vascular congestion. No pneumothorax. IMPRESSION: 1. Mild left pleural effusion or thickening. Airspace disease at the lingula and left base, some of this may be chronic but difficult to exclude an acute infiltrate in the region 2. Cardiomegaly with mild vascular congestion Electronically Signed   By: Donavan Foil M.D.   On: 11/23/2017 18:29   Ct Head Wo Contrast  Result Date: 12/02/2017 CLINICAL DATA:  Altered level of consciousness.  Recent stroke. EXAM: CT HEAD WITHOUT CONTRAST  TECHNIQUE: Contiguous axial images were obtained from the base of the skull through the vertex without intravenous contrast. COMPARISON:  Head CT from 9 days ago. FINDINGS: Brain: Subacute infarct in the right superior cerebellum and right posterior-lateral midbrain. No visible acute or interval infarct. No hemorrhage, hydrocephalus, or collection. Cerebral volume loss with ventriculomegaly. Mild chronic small vessel ischemia in the cerebral white matter. Vascular: Atherosclerotic calcification.  No hyperdense vessel. Skull: No acute or aggressive finding Sinuses/Orbits: Negative IMPRESSION: 1. Known subacute right superior cerebellar infarct. No hemorrhagic conversion. 2. No acute or interval abnormality. Electronically Signed   By: Monte Fantasia M.D.   On: 12/02/2017 16:38   Ct Head Wo Contrast  Result Date: 11/23/2017 CLINICAL DATA:  Lethargy following hydrocodone administration EXAM: CT HEAD WITHOUT CONTRAST CT CERVICAL SPINE WITHOUT CONTRAST TECHNIQUE: Multidetector CT imaging of the head and cervical spine was performed following the standard protocol without intravenous contrast. Multiplanar CT image reconstructions of the cervical spine were also generated. COMPARISON:  11/15/2007 FINDINGS: CT HEAD FINDINGS Brain: No evidence of acute infarction, hemorrhage, hydrocephalus, extra-axial collection or mass lesion/mass effect. Chronic atrophic and ischemic changes are noted. Vascular: No hyperdense vessel or unexpected calcification. Skull: Normal. Negative for fracture or focal lesion. Sinuses/Orbits: No acute finding. Other: None. CT CERVICAL SPINE FINDINGS Alignment: Alignment is within normal limits with the exception of degenerative anterolisthesis of C7 on T1. Skull base and vertebrae: 7 cervical segments are noted. Disc space narrowing is noted at C5-6, C6-7 and C7-T1. Significant sclerosis is noted at the C7-T1 interspace. Degenerative anterolisthesis is seen at C7-T1. Associated central canal  stenosis is noted at this level. No acute fracture or acute facet abnormality is noted. Facet hypertrophic changes are noted at multiple levels. Soft tissues and spinal canal: No prevertebral fluid or swelling. No visible canal hematoma. Upper chest: Within normal limits. Other: None IMPRESSION: CT of the head: Chronic atrophic and ischemic changes without acute abnormality. CT of the cervical spine: Multilevel degenerative change without acute abnormality Electronically Signed   By: Inez Catalina M.D.   On: 11/23/2017 18:33   Ct Angio Neck W And/or Wo Contrast  Result Date: 11/23/2017 CLINICAL DATA:  77 y/o  F; altered mental status of unclear cause. EXAM: CT ANGIOGRAPHY HEAD AND NECK CT PERFUSION BRAIN TECHNIQUE: Multidetector CT imaging of the head and neck was performed using the standard protocol during bolus administration of intravenous contrast. Multiplanar CT image reconstructions and MIPs were obtained to evaluate the vascular anatomy. Carotid stenosis measurements (when applicable) are obtained utilizing NASCET criteria, using the distal internal carotid diameter as the denominator. Multiphase CT imaging of the brain was performed following IV bolus contrast injection. Subsequent parametric perfusion maps were calculated using RAPID software. CONTRAST:  133mL ISOVUE-370 IOPAMIDOL (ISOVUE-370) INJECTION 76% COMPARISON:  11/23/2017 CT head.  12/09/2004 MRI head. FINDINGS: CT HEAD FINDINGS Brain: Region of hypoattenuation within the right superior cerebellar hemisphere and extending into the right hemi pons compatible with late acute to subacute infarction (series 5, image 10 and series 3, image 12). No associated hemorrhage or mass effect. No additional region of acute infarction, hemorrhage, or mass effect in the brain identified. Stable background of chronic microvascular ischemic changes and parenchymal volume loss of the supratentorial brain. Several small chronic lacunar infarcts within basal  ganglia. Vascular: As below. Skull: Normal. Negative for fracture or focal lesion. Sinuses/Orbits: No acute finding. Other: None. ASPECTS ([place] Stroke Program Early CT Score) - Ganglionic level infarction (caudate, lentiform nuclei, internal capsule, insula, M1-M3 cortex): 7 - Supraganglionic infarction (M4-M6 cortex): 3 Total score (0-10 with 10 being normal): 10 Review of the MIP images confirms the above findings CTA NECK FINDINGS Aortic arch: Standard branching. Imaged portion shows no evidence of aneurysm or dissection. No significant stenosis of the major arch vessel origins. Mild calcific atherosclerosis. Right carotid system: No evidence of dissection, stenosis (50% or greater) or occlusion. Mild non stenotic calcific atherosclerosis of the carotid bifurcation. Left carotid system: No evidence of dissection, stenosis (50% or greater) or occlusion. Mild non stenotic calcific atherosclerosis of the carotid bifurcation. Vertebral arteries: Left dominant. No evidence of dissection, stenosis (50% or greater) or occlusion. Skeleton: C6-T1 severe erosive discogenic degenerative changes with endplate eburnation and grade 1 anterolisthesis at the C7-T1 level with there is severe facet arthritis. Bones are demineralized. Other neck: Multiple thyroid nodules measuring up to 9 mm on the left. Upper chest: Negative. Review of the MIP images confirms the above findings CTA HEAD FINDINGS Anterior circulation: No significant stenosis, proximal occlusion, aneurysm, or vascular malformation. The right ICA is larger than the left ICA likely due to variant anatomy where bilateral ACA are perfused by the right A1. Posterior circulation: Left P1 short segment of mild stenosis and right P1/P2 tandem segments of severe stenosis. Irregularity and mild stenosis of the left SCA origin. The right SCA attenuates in caliber early and appears occluded (series 12, image 120 and series 15, image 26). Venous sinuses: As permitted by  contrast timing, patent. Anatomic variants: Anterior communicating artery and right posterior communicating artery. No left posterior communicating artery identified, likely hypoplastic or absent. Review of the MIP images confirms the above findings. CT Brain Perfusion Findings: CBF (<30%) Volume: 73mL Perfusion (Tmax>6.0s) volume: 64mL Mismatch Volume: 54mL Infarction Location:Right hemi pons and superior cerebellar hemisphere infarction. The area of infarction on noncontrast CT of head is larger than CBF (<30%) Volume indicating pseudonormalization of a region of the core infarction. IMPRESSION: CT head: 1. 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 No hemorrhage or associated mass effect. 2. Stable supratentorial chronic microvascular ischemic changes and parenchymal volume loss of the brain. CTA neck: 1. Patent carotid and vertebral arteries. No dissection, aneurysm, or hemodynamically significant stenosis utilizing NASCET criteria. 2. Severe cervical spondylosis at the C6-T1 levels. CTA head: 1. Right SCA occlusion. 2. Tandem segments of severe stenosis of right PCA. 3. Otherwise no large vessel occlusion, aneurysm, or significant stenosis is identified. CT brain perfusion: Perfusion anomaly in the right SCA distribution. The area of infarction on noncontrast CT of head is larger than CBF (<30%) Volume indicating pseudonormalization of a region of the core infarction. These results were called by telephone at the time of interpretation on 11/23/2017 at 9:03 pm  to Dr. Francine Graven , who verbally acknowledged these results. Electronically Signed   By: Kristine Garbe M.D.   On: 11/23/2017 21:07   Ct Cervical Spine Wo Contrast  Result Date: 11/23/2017 CLINICAL DATA:  Lethargy following hydrocodone administration EXAM: CT HEAD WITHOUT CONTRAST CT CERVICAL SPINE WITHOUT CONTRAST TECHNIQUE: Multidetector CT imaging of the head and cervical spine was performed  following the standard protocol without intravenous contrast. Multiplanar CT image reconstructions of the cervical spine were also generated. COMPARISON:  11/15/2007 FINDINGS: CT HEAD FINDINGS Brain: No evidence of acute infarction, hemorrhage, hydrocephalus, extra-axial collection or mass lesion/mass effect. Chronic atrophic and ischemic changes are noted. Vascular: No hyperdense vessel or unexpected calcification. Skull: Normal. Negative for fracture or focal lesion. Sinuses/Orbits: No acute finding. Other: None. CT CERVICAL SPINE FINDINGS Alignment: Alignment is within normal limits with the exception of degenerative anterolisthesis of C7 on T1. Skull base and vertebrae: 7 cervical segments are noted. Disc space narrowing is noted at C5-6, C6-7 and C7-T1. Significant sclerosis is noted at the C7-T1 interspace. Degenerative anterolisthesis is seen at C7-T1. Associated central canal stenosis is noted at this level. No acute fracture or acute facet abnormality is noted. Facet hypertrophic changes are noted at multiple levels. Soft tissues and spinal canal: No prevertebral fluid or swelling. No visible canal hematoma. Upper chest: Within normal limits. Other: None IMPRESSION: CT of the head: Chronic atrophic and ischemic changes without acute abnormality. CT of the cervical spine: Multilevel degenerative change without acute abnormality Electronically Signed   By: Inez Catalina M.D.   On: 11/23/2017 18:33   Mr Brain Wo Contrast  Result Date: 11/24/2017 CLINICAL DATA:  Altered mental status EXAM: MRI HEAD WITHOUT CONTRAST TECHNIQUE: Multiplanar, multiecho pulse sequences of the brain and surrounding structures were obtained without intravenous contrast. COMPARISON:  Head CT 11/23/2017 FINDINGS: BRAIN: The midline structures are normal. There is abnormal diffusion restriction within the dorsolateral right pons and superior right cerebellum, within the right superior cerebellar artery territory, as demonstrated on  the earlier CT. No mass lesion, hydrocephalus, dural abnormality or extra-axial collection. Early confluent hyperintense T2-weighted signal of the periventricular and deep white matter, most commonly due to chronic ischemic microangiopathy. No age-advanced or lobar predominant atrophy. There are a few central chronic microhemorrhages. VASCULAR: Major intracranial arterial and venous sinus flow voids are preserved. SKULL AND UPPER CERVICAL SPINE: The visualized skull base, calvarium, upper cervical spine and extracranial soft tissues are normal. SINUSES/ORBITS: No fluid levels or advanced mucosal thickening. No mastoid or middle ear effusion. Normal orbits. IMPRESSION: 1. 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 2. No hemorrhage or mass effect. 3. Chronic ischemic microangiopathy. Electronically Signed   By: Ulyses Jarred M.D.   On: 11/24/2017 06:03   Ct Cerebral Perfusion W Contrast  Result Date: 11/23/2017 CLINICAL DATA:  77 y/o  F; altered mental status of unclear cause. EXAM: CT ANGIOGRAPHY HEAD AND NECK CT PERFUSION BRAIN TECHNIQUE: Multidetector CT imaging of the head and neck was performed using the standard protocol during bolus administration of intravenous contrast. Multiplanar CT image reconstructions and MIPs were obtained to evaluate the vascular anatomy. Carotid stenosis measurements (when applicable) are obtained utilizing NASCET criteria, using the distal internal carotid diameter as the denominator. Multiphase CT imaging of the brain was performed following IV bolus contrast injection. Subsequent parametric perfusion maps were calculated using RAPID software. CONTRAST:  177mL ISOVUE-370 IOPAMIDOL (ISOVUE-370) INJECTION 76% COMPARISON:  11/23/2017 CT head.  12/09/2004 MRI head. FINDINGS: CT HEAD FINDINGS Brain: Region of hypoattenuation within the right superior cerebellar hemisphere and extending into the right hemi  pons compatible with late acute to subacute infarction (series 5, image 45 and series 3, image 12). No associated hemorrhage or mass effect. No additional region of acute infarction, hemorrhage, or mass effect in the brain identified. Stable background of chronic microvascular ischemic changes and parenchymal volume loss of the supratentorial brain. Several small chronic lacunar infarcts within basal ganglia. Vascular: As below. Skull: Normal. Negative for fracture or focal lesion. Sinuses/Orbits: No acute finding. Other: None. ASPECTS ([place] Stroke Program Early CT Score) - Ganglionic level infarction (caudate, lentiform nuclei, internal capsule, insula, M1-M3 cortex): 7 - Supraganglionic infarction (M4-M6 cortex): 3 Total score (0-10 with 10 being normal): 10 Review of the MIP images confirms the above findings CTA NECK FINDINGS Aortic arch: Standard branching. Imaged portion shows no evidence of aneurysm or dissection. No significant stenosis of the major arch vessel origins. Mild calcific atherosclerosis. Right carotid system: No evidence of dissection, stenosis (50% or greater) or occlusion. Mild non stenotic calcific atherosclerosis of the carotid bifurcation. Left carotid system: No evidence of dissection, stenosis (50% or greater) or occlusion. Mild non stenotic calcific atherosclerosis of the carotid bifurcation. Vertebral arteries: Left dominant. No evidence of dissection, stenosis (50% or greater) or occlusion. Skeleton: C6-T1 severe erosive discogenic degenerative changes with endplate eburnation and grade 1 anterolisthesis at the C7-T1 level with there is severe facet arthritis. Bones are demineralized. Other neck: Multiple thyroid nodules measuring up to 9 mm on the left. Upper chest: Negative. Review of the MIP images confirms the above findings CTA HEAD FINDINGS Anterior circulation: No significant stenosis, proximal occlusion, aneurysm, or vascular malformation. The right ICA is larger than the  left ICA likely due to variant anatomy where bilateral ACA are perfused by the right A1. Posterior circulation: Left P1 short segment of mild stenosis and right P1/P2 tandem segments of severe stenosis. Irregularity and mild stenosis of the left SCA origin. The right SCA attenuates in caliber early and appears occluded (series 12, image 120 and series 15, image 26). Venous sinuses: As permitted by contrast timing, patent. Anatomic variants: Anterior communicating artery and right posterior communicating artery. No left posterior communicating artery identified, likely hypoplastic or absent. Review of the MIP images confirms the above findings. CT Brain Perfusion Findings: CBF (<30%) Volume: 38mL Perfusion (Tmax>6.0s) volume: 60mL Mismatch Volume: 53mL Infarction Location:Right hemi pons and superior cerebellar hemisphere infarction. The area of infarction on noncontrast CT of head is larger than CBF (<30%) Volume indicating pseudonormalization of a region of the core infarction. IMPRESSION: CT head: 1. 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 No hemorrhage or associated mass effect. 2. Stable supratentorial chronic microvascular ischemic changes and parenchymal volume loss of the brain. CTA neck: 1. Patent carotid and vertebral arteries. No dissection, aneurysm, or hemodynamically significant stenosis utilizing NASCET criteria. 2. Severe cervical spondylosis at the C6-T1 levels. CTA head: 1. Right SCA occlusion. 2. Tandem segments of severe stenosis of right PCA. 3. Otherwise no large vessel occlusion, aneurysm, or significant stenosis is identified. CT brain perfusion: Perfusion anomaly in the right SCA distribution. The area of infarction on noncontrast CT of head is larger than CBF (<30%) Volume indicating pseudonormalization of a region of the core infarction. These results were called by telephone at the time of interpretation on 11/23/2017 at 9:03 pm to Dr. Francine Graven , who verbally acknowledged these results. Electronically  Signed   By: Kristine Garbe M.D.   On: 11/23/2017 21:07   Dg Chest Portable 1 View  Result Date: 12/02/2017 CLINICAL DATA:  Unresponsive, recent brainstem stroke, follow-up possible pneumonia on prior chest radiograph EXAM: PORTABLE CHEST 1 VIEW COMPARISON:  11/23/2017 FINDINGS: Lingular opacity, unchanged, favoring atelectasis/scarring although pneumonia is not excluded. Left lung base is obscured, although this favors overlying soft tissue, as this appearance is chronic dating back to 2016. No frank interstitial edema.  Right lung is clear.  No pneumothorax. The heart is normal in size. IMPRESSION: Lingular opacity, unchanged, possibly scarring, pneumonia not excluded. Electronically Signed   By: Julian Hy M.D.   On: 12/02/2017 17:05   Dg Chest Port 1v Same Day  Result Date: 12/02/2017 CLINICAL DATA:  Aspiration. EXAM: PORTABLE CHEST 1 VIEW COMPARISON:  December 02, 2017 FINDINGS: A tubular density projected over the right upper to mid lung was not seen earlier today. Left retrocardiac opacity remains unchanged. The heart, hila, mediastinum, lungs, and pleura are otherwise normal. IMPRESSION: A tubular/thickened platelike opacity in the right upper lung likely represents either something on the patient or atelectasis. This does not have the typical appearance of an infiltrate or aspiration. This was not seen earlier today. Persistent opacity in the left base, unchanged. No other changes. Electronically Signed   By: Dorise Bullion III M.D   On: 12/02/2017 22:14   Dg Swallowing Func-speech Pathology  Result Date: 11/25/2017 Objective Swallowing Evaluation: Type of Study: MBS-Modified Barium Swallow Study  Patient Details Name: Elizabeth Drake MRN: [id number] Date of Birth: Dec 13, 1940 Today's Date: 11/25/2017 Time: SLP Start Time (ACUTE ONLY): 1046 -SLP Stop Time (ACUTE ONLY): 1059 SLP Time Calculation (min) (ACUTE ONLY): 13 min  Past Medical History: Past Medical History: Diagnosis Date . Arthritis  . Atrial fibrillation ([place])  . Atrial fibrillation with RVR ([place]) 09/13/2014 . Chronic pain  . Diabetes mellitus without complication ([place])  . DVT (deep venous thrombosis) ([place])  . Hiatal hernia  . Hypertension  . Osteoporosis  . Pulmonary emboli ([place])  . Sarcoidosis  . Vertigo  . Wheelchair bound  Past Surgical History: Past Surgical History: Procedure Laterality Date . ABDOMINAL HYSTERECTOMY   . I&D EXTREMITY Left 09/13/2014  Procedure: IRRIGATION AND DEBRIDEMENT EXTREMITY;  Surgeon: Linna Hoff, MD;  Location: [facility];  Service: Orthopedics;  Laterality: Left; . INTRAMEDULLARY (IM) NAIL INTERTROCHANTERIC Left 06/27/2015  Procedure: INTRAMEDULLARY (IM) NAIL INTERTROCHANTRIC AFFIXIS;  Surgeon: Marybelle Killings, MD;  Location: [facility];  Service: Orthopedics;  Laterality: Left; . JOINT REPLACEMENT    toe . LUNG LOBECTOMY Left   Lower HPI: 77 year old female with medical history significant for type 2 diabetes mellitus, hypertension, chronic pain on opiate analgesia, pulmonary embolism, sarcoidosis initially presented to the ED poorly responsive in setting of questionable opiod overdose, found to have neuro change. MRI acute infarct of the superior right cerebellum and right dorso lateral pons, in the superior cerebellar artery territory. CXR mild left pleural effusion or thickening. Airspace disease at the lingula and left base, some of this may be chronic but difficult to exclude an acute infiltrate in the region, cardiomegaly with mild vascular congestion.  No data recorded Assessment / Plan / Recommendation CHL IP CLINICAL IMPRESSIONS 11/25/2017 Clinical Impression Moderate oral dysphagia marked by anterior spill, decreased control and delayed oral transit. Pharyngeal phase characterized by sensory impairments with late swallow initiation at the pyriform sinuses with thinner consistencies and valleculae with honey thick. Laryngeal penetration to vocal  cords consistently with  thin and nectar barium without sensation and ineffective attempts at cued throat clear/cough. No appreciable post swallow residue. Positioning somewhat challenging due to kyphosis. Recommend honey thick liquids, Dys 2 texture, crush pills and full assist and supervision with ST for safety and education.   SLP Visit Diagnosis Dysphagia, oropharyngeal phase (R13.12) Attention and concentration deficit following -- Frontal lobe and executive function deficit following -- Impact on safety and function Moderate aspiration risk   CHL IP TREATMENT RECOMMENDATION 11/25/2017 Treatment Recommendations Therapy as outlined in treatment plan below   Prognosis 11/25/2017 Prognosis for Safe Diet Advancement (No Data) Barriers to Reach Goals Cognitive deficits Barriers/Prognosis Comment -- CHL IP DIET RECOMMENDATION 11/25/2017 SLP Diet Recommendations Dysphagia 2 (Fine chop) solids;Honey thick liquids Liquid Administration via Cup Medication Administration Crushed with puree Compensations Slow rate;Small sips/bites;Lingual sweep for clearance of pocketing Postural Changes Seated upright at 90 degrees   CHL IP OTHER RECOMMENDATIONS 11/25/2017 Recommended Consults -- Oral Care Recommendations Oral care BID Other Recommendations --   CHL IP FOLLOW UP RECOMMENDATIONS 11/25/2017 Follow up Recommendations Other (comment)   CHL IP FREQUENCY AND DURATION 11/25/2017 Speech Therapy Frequency (ACUTE ONLY) min 2x/week Treatment Duration 2 weeks      CHL IP ORAL PHASE 11/25/2017 Oral Phase Impaired Oral - Pudding Teaspoon -- Oral - Pudding Cup -- Oral - Honey Teaspoon -- Oral - Honey Cup Decreased bolus cohesion;Delayed oral transit;Weak lingual manipulation;Left anterior bolus loss Oral - Nectar Teaspoon -- Oral - Nectar Cup Weak lingual manipulation;Delayed oral transit;Decreased bolus cohesion;Left anterior bolus loss Oral - Nectar Straw -- Oral - Thin Teaspoon -- Oral - Thin Cup -- Oral - Thin Straw WFL Oral - Puree Delayed  oral transit Oral - Mech Soft -- Oral - Regular Weak lingual manipulation;Delayed oral transit Oral - Multi-Consistency -- Oral - Pill -- Oral Phase - Comment --  CHL IP PHARYNGEAL PHASE 11/25/2017 Pharyngeal Phase Impaired Pharyngeal- Pudding Teaspoon -- Pharyngeal -- Pharyngeal- Pudding Cup -- Pharyngeal -- Pharyngeal- Honey Teaspoon -- Pharyngeal -- Pharyngeal- Honey Cup -- Pharyngeal -- Pharyngeal- Nectar Teaspoon -- Pharyngeal -- Pharyngeal- Nectar Cup Delayed swallow initiation-pyriform sinuses;Penetration/Aspiration during swallow Pharyngeal Material enters airway, CONTACTS cords and not ejected out Pharyngeal- Nectar Straw -- Pharyngeal -- Pharyngeal- Thin Teaspoon -- Pharyngeal -- Pharyngeal- Thin Cup -- Pharyngeal -- Pharyngeal- Thin Straw Delayed swallow initiation-pyriform sinuses;Penetration/Aspiration during swallow Pharyngeal Material enters airway, CONTACTS cords and not ejected out Pharyngeal- Puree Penetration/Aspiration during swallow Pharyngeal Material enters airway, remains ABOVE vocal cords and not ejected out Pharyngeal- Mechanical Soft -- Pharyngeal -- Pharyngeal- Regular Delayed swallow initiation-vallecula Pharyngeal -- Pharyngeal- Multi-consistency -- Pharyngeal -- Pharyngeal- Pill -- Pharyngeal -- Pharyngeal Comment --  CHL IP CERVICAL ESOPHAGEAL PHASE 11/25/2017 Cervical Esophageal Phase WFL Pudding Teaspoon -- Pudding Cup -- Honey Teaspoon -- Honey Cup -- Nectar Teaspoon -- Nectar Cup -- Nectar Straw -- Thin Teaspoon -- Thin Cup -- Thin Straw -- Puree -- Mechanical Soft -- Regular -- Multi-consistency -- Pill -- Cervical Esophageal Comment -- No flowsheet data found. Houston Siren 11/25/2017, 11:56 AM Orbie Pyo Colvin Caroli.Ed [employer] [phone]               Microbiology: Recent Results (from the past 240 hour(s))  Blood Culture (routine x 2)     Status: None (Preliminary result)   Collection Time: 12/02/17  3:20 PM  Result Value Ref Range Status   Specimen Description  BLOOD LEFT WRIST  Final   Special Requests   Final    BOTTLES DRAWN AEROBIC  AND ANAEROBIC Blood Culture adequate volume   Culture   Final    NO GROWTH 4 DAYS Performed at [facility], [address] [place], [place] [address]    Report Status PENDING  Incomplete  Blood Culture (routine x 2)     Status: None (Preliminary result)   Collection Time: 12/02/17  3:21 PM  Result Value Ref Range Status   Specimen Description LEFT ANTECUBITAL  Final   Special Requests   Final    BOTTLES DRAWN AEROBIC AND ANAEROBIC Blood Culture adequate volume   Culture   Final    NO GROWTH 4 DAYS Performed at [facility], [address], [place], [place] [address]    Report Status PENDING  Incomplete  Urine culture     Status: None   Collection Time: 12/02/17  3:40 PM  Result Value Ref Range Status   Specimen Description   Final    URINE, CATHETERIZED Performed at [facility], [address], [place], [place] [address]    Special Requests   Final    NONE Performed at [facility], [address] [place], [place] [address]    Culture   Final    NO GROWTH Performed at [facility], [place] [address] [place], [place] [address]    Report Status 12/04/2017 FINAL  Final  MRSA PCR Screening     Status: None   Collection Time: 12/02/17 11:38 PM  Result Value Ref Range Status   MRSA by PCR NEGATIVE NEGATIVE Final    Comment:        The GeneXpert MRSA Assay (FDA approved for NASAL specimens only), is one component of a comprehensive MRSA colonization surveillance program. It is not intended to diagnose MRSA infection nor to guide or monitor treatment for MRSA infections. Performed at [facility], [address] [place], [place] [address]      Labs: Basic Metabolic Panel: Recent Labs  Lab 12/02/17 1519 12/02/17 2001 12/03/17 0528 12/04/17 0525 12/06/17 0521  NA 148* 149* 146* 148* 144  K 3.8 3.7 3.4* 3.7 3.2*  CL 111 112* 111 114* 110  CO2 22  --  27 27 23   GLUCOSE 198* 180* 146*  103* 147*  BUN 50* 39* 37* 25* 9  CREATININE 0.91 0.60 0.65 0.60 0.50  CALCIUM 10.5*  --  9.3 8.9 9.0   Liver Function Tests: Recent Labs  Lab 12/02/17 1519  AST 17  ALT 11*  ALKPHOS 58  BILITOT 0.8  PROT 10.5*  ALBUMIN 3.7   CBC: Recent Labs  Lab 12/02/17 1519  12/03/17 0004 12/03/17 0528 12/03/17 1222 12/03/17 2001 12/04/17 0525  WBC 10.8*  --   --  8.3  --   --  7.9  NEUTROABS 8.1*  --   --   --   --   --   --   HGB 13.8   < > 11.3* 11.7* 11.4* 11.0* 11.8*  HCT 45.2   < > 38.2 39.1 38.3 37.2 39.4  MCV 101.3*  --   --  102.6*  --   --  104.2*  PLT 206  --   --  146*  --   --  124*   < > = values in this interval not displayed.   CBG: Recent Labs  Lab 12/05/17 2033 12/06/17 0036 12/06/17 0433 12/06/17 0750 12/06/17 1154  GLUCAP 148* 126* 148* 147* 187*    Signed:  Barton Dubois MD.  [facility] 12/06/2017, 3:49 PM

## 2017-12-07 LAB — CULTURE, BLOOD (ROUTINE X 2)
Culture: NO GROWTH
Culture: NO GROWTH
Special Requests: ADEQUATE
Special Requests: ADEQUATE

## 2017-12-08 DIAGNOSIS — I63441 Cerebral infarction due to embolism of right cerebellar artery: Secondary | ICD-10-CM | POA: Diagnosis not present

## 2017-12-08 DIAGNOSIS — R1312 Dysphagia, oropharyngeal phase: Secondary | ICD-10-CM | POA: Diagnosis not present

## 2017-12-09 DIAGNOSIS — M1990 Unspecified osteoarthritis, unspecified site: Secondary | ICD-10-CM | POA: Diagnosis not present

## 2017-12-09 DIAGNOSIS — E785 Hyperlipidemia, unspecified: Secondary | ICD-10-CM | POA: Diagnosis not present

## 2017-12-09 DIAGNOSIS — I480 Paroxysmal atrial fibrillation: Secondary | ICD-10-CM | POA: Diagnosis not present

## 2017-12-09 DIAGNOSIS — R630 Anorexia: Secondary | ICD-10-CM | POA: Diagnosis not present

## 2017-12-09 DIAGNOSIS — D869 Sarcoidosis, unspecified: Secondary | ICD-10-CM | POA: Diagnosis not present

## 2017-12-09 DIAGNOSIS — R1310 Dysphagia, unspecified: Secondary | ICD-10-CM | POA: Diagnosis not present

## 2017-12-09 DIAGNOSIS — M810 Age-related osteoporosis without current pathological fracture: Secondary | ICD-10-CM | POA: Diagnosis not present

## 2017-12-09 DIAGNOSIS — I639 Cerebral infarction, unspecified: Secondary | ICD-10-CM | POA: Diagnosis not present

## 2017-12-09 DIAGNOSIS — N3946 Mixed incontinence: Secondary | ICD-10-CM | POA: Diagnosis not present

## 2017-12-09 DIAGNOSIS — R531 Weakness: Secondary | ICD-10-CM | POA: Diagnosis not present

## 2017-12-09 DIAGNOSIS — E43 Unspecified severe protein-calorie malnutrition: Secondary | ICD-10-CM | POA: Diagnosis not present

## 2017-12-09 DIAGNOSIS — I10 Essential (primary) hypertension: Secondary | ICD-10-CM | POA: Diagnosis not present

## 2017-12-09 DIAGNOSIS — E119 Type 2 diabetes mellitus without complications: Secondary | ICD-10-CM | POA: Diagnosis not present

## 2017-12-10 DIAGNOSIS — E785 Hyperlipidemia, unspecified: Secondary | ICD-10-CM | POA: Diagnosis not present

## 2017-12-10 DIAGNOSIS — D869 Sarcoidosis, unspecified: Secondary | ICD-10-CM | POA: Diagnosis not present

## 2017-12-10 DIAGNOSIS — E43 Unspecified severe protein-calorie malnutrition: Secondary | ICD-10-CM | POA: Diagnosis not present

## 2017-12-10 DIAGNOSIS — I639 Cerebral infarction, unspecified: Secondary | ICD-10-CM | POA: Diagnosis not present

## 2017-12-10 DIAGNOSIS — I10 Essential (primary) hypertension: Secondary | ICD-10-CM | POA: Diagnosis not present

## 2017-12-10 DIAGNOSIS — E119 Type 2 diabetes mellitus without complications: Secondary | ICD-10-CM | POA: Diagnosis not present

## 2017-12-12 ENCOUNTER — Encounter: Payer: Self-pay | Admitting: Diagnostic Neuroimaging

## 2017-12-12 ENCOUNTER — Telehealth: Payer: Self-pay | Admitting: Diagnostic Neuroimaging

## 2017-12-12 NOTE — Telephone Encounter (Signed)
Noted. Sad news. Please send my condolences to her families. Thanks.   Rosalin Hawking, MD PhD Stroke Neurology 12/12/2017 4:19 PM

## 2017-12-12 NOTE — Telephone Encounter (Signed)
Carol/[place] [phone] called to advise the pt passed Dec 29, 2017.  FYI

## 2017-12-25 ENCOUNTER — Ambulatory Visit: Payer: Medicare Other | Admitting: Adult Health

## 2018-01-04 DEATH — deceased
